# Patient Record
Sex: Female | Born: 1950 | Race: Black or African American | Hispanic: No | Marital: Single | Smoking: Current every day smoker
Health system: Southern US, Community
[De-identification: ages and names within clinical notes are randomized; demographics above are authoritative.]

## PROBLEM LIST (undated history)

## (undated) DIAGNOSIS — R112 Nausea with vomiting, unspecified: Secondary | ICD-10-CM

## (undated) DIAGNOSIS — G8929 Other chronic pain: Secondary | ICD-10-CM

## (undated) DIAGNOSIS — Z860100 Personal history of colon polyps, unspecified: Secondary | ICD-10-CM

## (undated) DIAGNOSIS — J320 Chronic maxillary sinusitis: Secondary | ICD-10-CM

## (undated) DIAGNOSIS — R519 Headache, unspecified: Secondary | ICD-10-CM

## (undated) DIAGNOSIS — H269 Unspecified cataract: Secondary | ICD-10-CM

## (undated) DIAGNOSIS — R2990 Unspecified symptoms and signs involving the nervous system: Secondary | ICD-10-CM

## (undated) DIAGNOSIS — F17200 Nicotine dependence, unspecified, uncomplicated: Secondary | ICD-10-CM

## (undated) DIAGNOSIS — M654 Radial styloid tenosynovitis [de Quervain]: Secondary | ICD-10-CM

## (undated) DIAGNOSIS — R1013 Epigastric pain: Secondary | ICD-10-CM

## (undated) DIAGNOSIS — K219 Gastro-esophageal reflux disease without esophagitis: Secondary | ICD-10-CM

## (undated) DIAGNOSIS — J449 Chronic obstructive pulmonary disease, unspecified: Secondary | ICD-10-CM

## (undated) DIAGNOSIS — J42 Unspecified chronic bronchitis: Secondary | ICD-10-CM

## (undated) DIAGNOSIS — M545 Low back pain, unspecified: Secondary | ICD-10-CM

## (undated) DIAGNOSIS — R51 Headache: Secondary | ICD-10-CM

## (undated) DIAGNOSIS — L408 Other psoriasis: Secondary | ICD-10-CM

## (undated) DIAGNOSIS — M79604 Pain in right leg: Secondary | ICD-10-CM

## (undated) DIAGNOSIS — R109 Unspecified abdominal pain: Secondary | ICD-10-CM

## (undated) HISTORY — PX: UPPER GASTROINTESTINAL ENDOSCOPY: SHX188

## (undated) HISTORY — PX: APPENDECTOMY: SHX54

## (undated) HISTORY — PX: TONSILLECTOMY: SUR1361

## (undated) HISTORY — DX: Headache, unspecified: R51.9

## (undated) HISTORY — PX: HERNIA REPAIR: SHX51

## (undated) HISTORY — DX: Chronic maxillary sinusitis: J32.0

## (undated) HISTORY — PX: ABDOMINAL HERNIA REPAIR: SHX539

## (undated) HISTORY — PX: VAGINAL HYSTERECTOMY: SUR661

---

## 1999-11-21 ENCOUNTER — Encounter: Payer: Self-pay | Admitting: Family Medicine

## 1999-11-21 ENCOUNTER — Ambulatory Visit (HOSPITAL_COMMUNITY): Admission: RE | Admit: 1999-11-21 | Discharge: 1999-11-21 | Payer: Self-pay | Admitting: Family Medicine

## 1999-11-22 ENCOUNTER — Emergency Department (HOSPITAL_COMMUNITY): Admission: EM | Admit: 1999-11-22 | Discharge: 1999-11-23 | Payer: Self-pay | Admitting: Emergency Medicine

## 1999-11-23 ENCOUNTER — Encounter: Payer: Self-pay | Admitting: Emergency Medicine

## 2000-10-09 ENCOUNTER — Emergency Department (HOSPITAL_COMMUNITY): Admission: EM | Admit: 2000-10-09 | Discharge: 2000-10-10 | Payer: Self-pay | Admitting: Emergency Medicine

## 2000-10-10 ENCOUNTER — Encounter: Payer: Self-pay | Admitting: Family Medicine

## 2000-10-28 ENCOUNTER — Ambulatory Visit (HOSPITAL_COMMUNITY): Admission: RE | Admit: 2000-10-28 | Discharge: 2000-10-28 | Payer: Self-pay | Admitting: Family Medicine

## 2001-03-01 ENCOUNTER — Encounter: Payer: Self-pay | Admitting: Family Medicine

## 2001-03-01 ENCOUNTER — Ambulatory Visit (HOSPITAL_COMMUNITY): Admission: RE | Admit: 2001-03-01 | Discharge: 2001-03-01 | Payer: Self-pay | Admitting: Family Medicine

## 2004-05-27 ENCOUNTER — Ambulatory Visit: Payer: Self-pay | Admitting: Family Medicine

## 2004-06-05 ENCOUNTER — Ambulatory Visit: Payer: Self-pay | Admitting: Internal Medicine

## 2004-06-06 ENCOUNTER — Ambulatory Visit: Payer: Self-pay | Admitting: *Deleted

## 2004-06-12 ENCOUNTER — Ambulatory Visit: Payer: Self-pay | Admitting: Internal Medicine

## 2004-07-11 ENCOUNTER — Ambulatory Visit: Payer: Self-pay | Admitting: Internal Medicine

## 2004-07-16 ENCOUNTER — Ambulatory Visit: Payer: Self-pay | Admitting: Internal Medicine

## 2004-08-08 ENCOUNTER — Ambulatory Visit: Payer: Self-pay | Admitting: Internal Medicine

## 2004-09-23 ENCOUNTER — Ambulatory Visit: Payer: Self-pay | Admitting: Internal Medicine

## 2004-11-21 ENCOUNTER — Ambulatory Visit: Payer: Self-pay | Admitting: Internal Medicine

## 2004-12-02 ENCOUNTER — Ambulatory Visit (HOSPITAL_COMMUNITY): Admission: RE | Admit: 2004-12-02 | Discharge: 2004-12-02 | Payer: Self-pay | Admitting: Internal Medicine

## 2004-12-18 ENCOUNTER — Ambulatory Visit: Payer: Self-pay | Admitting: Internal Medicine

## 2005-01-23 ENCOUNTER — Ambulatory Visit: Payer: Self-pay | Admitting: Internal Medicine

## 2005-02-12 ENCOUNTER — Ambulatory Visit: Payer: Self-pay | Admitting: Internal Medicine

## 2005-02-12 ENCOUNTER — Inpatient Hospital Stay (HOSPITAL_COMMUNITY): Admission: AD | Admit: 2005-02-12 | Discharge: 2005-02-16 | Payer: Self-pay | Admitting: Internal Medicine

## 2005-02-26 ENCOUNTER — Encounter (INDEPENDENT_AMBULATORY_CARE_PROVIDER_SITE_OTHER): Payer: Self-pay | Admitting: Specialist

## 2005-02-26 ENCOUNTER — Ambulatory Visit (HOSPITAL_COMMUNITY): Admission: RE | Admit: 2005-02-26 | Discharge: 2005-02-26 | Payer: Self-pay | Admitting: Gastroenterology

## 2005-02-26 HISTORY — PX: COLONOSCOPY W/ POLYPECTOMY: SHX1380

## 2005-02-28 ENCOUNTER — Emergency Department (HOSPITAL_COMMUNITY): Admission: EM | Admit: 2005-02-28 | Discharge: 2005-03-01 | Payer: Self-pay | Admitting: Emergency Medicine

## 2005-05-21 ENCOUNTER — Ambulatory Visit: Payer: Self-pay | Admitting: Internal Medicine

## 2005-06-23 ENCOUNTER — Ambulatory Visit: Payer: Self-pay | Admitting: Internal Medicine

## 2005-11-17 ENCOUNTER — Ambulatory Visit: Payer: Self-pay | Admitting: Internal Medicine

## 2005-11-25 ENCOUNTER — Ambulatory Visit: Payer: Self-pay | Admitting: Internal Medicine

## 2005-12-18 ENCOUNTER — Ambulatory Visit (HOSPITAL_COMMUNITY): Admission: RE | Admit: 2005-12-18 | Discharge: 2005-12-18 | Payer: Self-pay | Admitting: Internal Medicine

## 2005-12-18 ENCOUNTER — Ambulatory Visit: Payer: Self-pay | Admitting: Internal Medicine

## 2005-12-19 ENCOUNTER — Ambulatory Visit: Payer: Self-pay | Admitting: Internal Medicine

## 2005-12-22 ENCOUNTER — Ambulatory Visit: Payer: Self-pay | Admitting: Internal Medicine

## 2006-02-02 ENCOUNTER — Ambulatory Visit: Payer: Self-pay | Admitting: Internal Medicine

## 2006-02-19 ENCOUNTER — Ambulatory Visit: Payer: Self-pay | Admitting: Family Medicine

## 2006-02-25 ENCOUNTER — Ambulatory Visit (HOSPITAL_COMMUNITY): Admission: RE | Admit: 2006-02-25 | Discharge: 2006-02-25 | Payer: Self-pay | Admitting: Internal Medicine

## 2006-03-10 ENCOUNTER — Ambulatory Visit: Payer: Self-pay | Admitting: Internal Medicine

## 2006-03-12 ENCOUNTER — Ambulatory Visit (HOSPITAL_COMMUNITY): Admission: RE | Admit: 2006-03-12 | Discharge: 2006-03-12 | Payer: Self-pay | Admitting: Internal Medicine

## 2006-04-17 ENCOUNTER — Ambulatory Visit: Payer: Self-pay | Admitting: Internal Medicine

## 2006-05-12 ENCOUNTER — Ambulatory Visit: Payer: Self-pay | Admitting: Internal Medicine

## 2006-05-15 ENCOUNTER — Ambulatory Visit (HOSPITAL_COMMUNITY): Admission: RE | Admit: 2006-05-15 | Discharge: 2006-05-15 | Payer: Self-pay | Admitting: Internal Medicine

## 2006-05-19 ENCOUNTER — Ambulatory Visit: Payer: Self-pay | Admitting: Internal Medicine

## 2006-05-26 ENCOUNTER — Ambulatory Visit: Payer: Self-pay | Admitting: Internal Medicine

## 2006-06-02 ENCOUNTER — Ambulatory Visit: Payer: Self-pay | Admitting: Internal Medicine

## 2006-06-10 ENCOUNTER — Ambulatory Visit: Payer: Self-pay | Admitting: Internal Medicine

## 2006-06-18 ENCOUNTER — Ambulatory Visit: Payer: Self-pay | Admitting: Internal Medicine

## 2006-07-22 ENCOUNTER — Ambulatory Visit: Payer: Self-pay | Admitting: Family Medicine

## 2006-07-27 ENCOUNTER — Ambulatory Visit (HOSPITAL_COMMUNITY): Admission: RE | Admit: 2006-07-27 | Discharge: 2006-07-27 | Payer: Self-pay | Admitting: Internal Medicine

## 2006-08-03 ENCOUNTER — Ambulatory Visit: Payer: Self-pay | Admitting: Internal Medicine

## 2006-12-07 ENCOUNTER — Ambulatory Visit: Payer: Self-pay | Admitting: Internal Medicine

## 2006-12-16 ENCOUNTER — Ambulatory Visit: Payer: Self-pay | Admitting: Internal Medicine

## 2006-12-23 ENCOUNTER — Ambulatory Visit: Payer: Self-pay | Admitting: Internal Medicine

## 2006-12-25 ENCOUNTER — Ambulatory Visit (HOSPITAL_COMMUNITY): Admission: RE | Admit: 2006-12-25 | Discharge: 2006-12-25 | Payer: Self-pay | Admitting: Internal Medicine

## 2006-12-29 ENCOUNTER — Encounter: Admission: RE | Admit: 2006-12-29 | Discharge: 2006-12-29 | Payer: Self-pay | Admitting: Internal Medicine

## 2006-12-29 DIAGNOSIS — K219 Gastro-esophageal reflux disease without esophagitis: Secondary | ICD-10-CM | POA: Insufficient documentation

## 2006-12-29 DIAGNOSIS — L408 Other psoriasis: Secondary | ICD-10-CM

## 2006-12-29 DIAGNOSIS — K449 Diaphragmatic hernia without obstruction or gangrene: Secondary | ICD-10-CM | POA: Insufficient documentation

## 2006-12-29 DIAGNOSIS — J441 Chronic obstructive pulmonary disease with (acute) exacerbation: Secondary | ICD-10-CM | POA: Insufficient documentation

## 2006-12-29 HISTORY — DX: Other psoriasis: L40.8

## 2007-01-13 ENCOUNTER — Ambulatory Visit: Payer: Self-pay | Admitting: Internal Medicine

## 2007-04-13 ENCOUNTER — Encounter (INDEPENDENT_AMBULATORY_CARE_PROVIDER_SITE_OTHER): Payer: Self-pay | Admitting: Internal Medicine

## 2007-04-13 ENCOUNTER — Ambulatory Visit: Payer: Self-pay | Admitting: Family Medicine

## 2007-04-13 LAB — CONVERTED CEMR LAB
AST: 12 units/L (ref 0–37)
Alkaline Phosphatase: 95 units/L (ref 39–117)
BUN: 11 mg/dL (ref 6–23)
Basophils Absolute: 0 10*3/uL (ref 0.0–0.1)
Basophils Relative: 0 % (ref 0–1)
Creatinine, Ser: 0.74 mg/dL (ref 0.40–1.20)
Eosinophils Absolute: 0.1 10*3/uL (ref 0.0–0.7)
Hemoglobin: 12.6 g/dL (ref 12.0–15.0)
MCHC: 31.7 g/dL (ref 30.0–36.0)
MCV: 92.8 fL (ref 78.0–100.0)
Monocytes Absolute: 0.4 10*3/uL (ref 0.2–0.7)
Monocytes Relative: 5 % (ref 3–11)
Neutrophils Relative %: 48 % (ref 43–77)
RBC: 4.28 M/uL (ref 3.87–5.11)
RDW: 14.1 % — ABNORMAL HIGH (ref 11.5–14.0)

## 2007-05-19 ENCOUNTER — Encounter (INDEPENDENT_AMBULATORY_CARE_PROVIDER_SITE_OTHER): Payer: Self-pay | Admitting: *Deleted

## 2007-08-12 ENCOUNTER — Ambulatory Visit: Payer: Self-pay | Admitting: Internal Medicine

## 2007-09-07 ENCOUNTER — Ambulatory Visit: Payer: Self-pay | Admitting: Internal Medicine

## 2007-09-29 ENCOUNTER — Ambulatory Visit: Payer: Self-pay | Admitting: Internal Medicine

## 2007-10-19 ENCOUNTER — Ambulatory Visit: Payer: Self-pay | Admitting: Internal Medicine

## 2007-10-19 LAB — CONVERTED CEMR LAB
ALT: 8 units/L (ref 0–35)
AST: 10 units/L (ref 0–37)
Basophils Absolute: 0 10*3/uL (ref 0.0–0.1)
Basophils Relative: 0 % (ref 0–1)
CO2: 25 meq/L (ref 19–32)
Creatinine, Ser: 0.76 mg/dL (ref 0.40–1.20)
Eosinophils Absolute: 0.1 10*3/uL (ref 0.0–0.7)
Eosinophils Relative: 1 % (ref 0–5)
HCT: 41.4 % (ref 36.0–46.0)
Hemoglobin: 13.2 g/dL (ref 12.0–15.0)
MCHC: 31.9 g/dL (ref 30.0–36.0)
MCV: 91.8 fL (ref 78.0–100.0)
Monocytes Absolute: 0.5 10*3/uL (ref 0.1–1.0)
RDW: 13.8 % (ref 11.5–15.5)
Total Bilirubin: 0.4 mg/dL (ref 0.3–1.2)

## 2007-10-28 ENCOUNTER — Ambulatory Visit: Payer: Self-pay | Admitting: Internal Medicine

## 2007-12-21 ENCOUNTER — Ambulatory Visit: Payer: Self-pay | Admitting: Internal Medicine

## 2008-01-06 ENCOUNTER — Ambulatory Visit: Payer: Self-pay | Admitting: Internal Medicine

## 2008-02-17 ENCOUNTER — Ambulatory Visit: Payer: Self-pay | Admitting: Internal Medicine

## 2008-03-02 ENCOUNTER — Ambulatory Visit: Payer: Self-pay | Admitting: Internal Medicine

## 2008-03-10 ENCOUNTER — Ambulatory Visit: Payer: Self-pay | Admitting: Internal Medicine

## 2008-03-20 ENCOUNTER — Ambulatory Visit: Payer: Self-pay | Admitting: Internal Medicine

## 2008-03-27 ENCOUNTER — Ambulatory Visit: Payer: Self-pay | Admitting: Internal Medicine

## 2008-04-10 ENCOUNTER — Ambulatory Visit: Payer: Self-pay | Admitting: Internal Medicine

## 2008-05-18 ENCOUNTER — Ambulatory Visit (HOSPITAL_COMMUNITY): Admission: RE | Admit: 2008-05-18 | Discharge: 2008-05-18 | Payer: Self-pay | Admitting: Gastroenterology

## 2008-05-25 ENCOUNTER — Ambulatory Visit (HOSPITAL_COMMUNITY): Admission: RE | Admit: 2008-05-25 | Discharge: 2008-05-25 | Payer: Self-pay | Admitting: Gastroenterology

## 2008-06-08 ENCOUNTER — Encounter: Admission: RE | Admit: 2008-06-08 | Discharge: 2008-06-08 | Payer: Self-pay | Admitting: Internal Medicine

## 2008-06-22 ENCOUNTER — Ambulatory Visit (HOSPITAL_COMMUNITY): Admission: RE | Admit: 2008-06-22 | Discharge: 2008-06-22 | Payer: Self-pay | Admitting: Gastroenterology

## 2008-09-14 ENCOUNTER — Ambulatory Visit: Payer: Self-pay | Admitting: Internal Medicine

## 2008-10-24 ENCOUNTER — Ambulatory Visit: Payer: Self-pay | Admitting: Internal Medicine

## 2008-10-31 ENCOUNTER — Ambulatory Visit (HOSPITAL_COMMUNITY): Admission: RE | Admit: 2008-10-31 | Discharge: 2008-10-31 | Payer: Self-pay | Admitting: Internal Medicine

## 2008-11-06 ENCOUNTER — Emergency Department (HOSPITAL_COMMUNITY): Admission: EM | Admit: 2008-11-06 | Discharge: 2008-11-06 | Payer: Self-pay | Admitting: Emergency Medicine

## 2008-11-15 ENCOUNTER — Emergency Department (HOSPITAL_COMMUNITY): Admission: EM | Admit: 2008-11-15 | Discharge: 2008-11-15 | Payer: Self-pay | Admitting: Emergency Medicine

## 2009-04-15 ENCOUNTER — Emergency Department (HOSPITAL_COMMUNITY): Admission: EM | Admit: 2009-04-15 | Discharge: 2009-04-15 | Payer: Self-pay | Admitting: Emergency Medicine

## 2009-08-22 ENCOUNTER — Ambulatory Visit: Payer: Self-pay | Admitting: Internal Medicine

## 2009-09-06 ENCOUNTER — Ambulatory Visit (HOSPITAL_COMMUNITY): Admission: RE | Admit: 2009-09-06 | Discharge: 2009-09-06 | Payer: Self-pay | Admitting: Internal Medicine

## 2009-10-29 ENCOUNTER — Ambulatory Visit: Payer: Self-pay | Admitting: Internal Medicine

## 2009-10-29 LAB — CONVERTED CEMR LAB
BUN: 9 mg/dL (ref 6–23)
CO2: 27 meq/L (ref 19–32)
Calcium: 9.8 mg/dL (ref 8.4–10.5)
Chloride: 106 meq/L (ref 96–112)
Cholesterol: 210 mg/dL — ABNORMAL HIGH (ref 0–200)
Creatinine, Ser: 0.81 mg/dL (ref 0.40–1.20)
Eosinophils Absolute: 0.1 10*3/uL (ref 0.0–0.7)
Eosinophils Relative: 2 % (ref 0–5)
Glucose, Bld: 82 mg/dL (ref 70–99)
HCT: 42.1 % (ref 36.0–46.0)
HDL: 44 mg/dL (ref >39)
Lymphs Abs: 3 10*3/uL (ref 0.7–4.0)
MCV: 91.3 fL (ref 78.0–100.0)
Platelets: 237 10*3/uL (ref 150–400)
Total CHOL/HDL Ratio: 4.8
Triglycerides: 111 mg/dL (ref <150)
WBC: 5.6 10*3/uL (ref 4.0–10.5)

## 2009-10-31 ENCOUNTER — Ambulatory Visit (HOSPITAL_COMMUNITY): Admission: RE | Admit: 2009-10-31 | Discharge: 2009-10-31 | Payer: Self-pay | Admitting: Internal Medicine

## 2009-11-19 ENCOUNTER — Emergency Department (HOSPITAL_COMMUNITY): Admission: EM | Admit: 2009-11-19 | Discharge: 2009-11-19 | Payer: Self-pay | Admitting: Emergency Medicine

## 2009-12-04 ENCOUNTER — Ambulatory Visit: Payer: Self-pay | Admitting: Internal Medicine

## 2009-12-05 ENCOUNTER — Encounter (INDEPENDENT_AMBULATORY_CARE_PROVIDER_SITE_OTHER): Payer: Self-pay | Admitting: Internal Medicine

## 2009-12-24 ENCOUNTER — Ambulatory Visit: Payer: Self-pay | Admitting: Internal Medicine

## 2009-12-25 ENCOUNTER — Ambulatory Visit: Payer: Self-pay | Admitting: Internal Medicine

## 2010-01-15 ENCOUNTER — Ambulatory Visit: Payer: Self-pay | Admitting: Internal Medicine

## 2010-01-15 LAB — CONVERTED CEMR LAB
Albumin: 4.8 g/dL (ref 3.5–5.2)
Alkaline Phosphatase: 84 units/L (ref 39–117)
BUN: 13 mg/dL (ref 6–23)
Basophils Absolute: 0 10*3/uL (ref 0.0–0.1)
Basophils Relative: 0 % (ref 0–1)
CO2: 26 meq/L (ref 19–32)
Eosinophils Relative: 1 % (ref 0–5)
Free T4: 1.33 ng/dL (ref 0.80–1.80)
Glucose, Bld: 73 mg/dL (ref 70–99)
HCT: 43.9 % (ref 36.0–46.0)
Hemoglobin: 14.1 g/dL (ref 12.0–15.0)
Lipase: 43 units/L (ref 0–75)
MCHC: 32.1 g/dL (ref 30.0–36.0)
Monocytes Absolute: 0.5 10*3/uL (ref 0.1–1.0)
Monocytes Relative: 8 % (ref 3–12)
Potassium: 5.2 meq/L (ref 3.5–5.3)
RDW: 13.9 % (ref 11.5–15.5)
TSH: 0.921 microintl units/mL (ref 0.350–4.500)
Total Bilirubin: 0.5 mg/dL (ref 0.3–1.2)

## 2010-01-27 ENCOUNTER — Emergency Department (HOSPITAL_COMMUNITY): Admission: EM | Admit: 2010-01-27 | Discharge: 2010-01-27 | Payer: Self-pay | Admitting: Emergency Medicine

## 2010-01-30 ENCOUNTER — Ambulatory Visit (HOSPITAL_COMMUNITY): Admission: RE | Admit: 2010-01-30 | Discharge: 2010-01-30 | Payer: Self-pay | Admitting: Internal Medicine

## 2010-02-05 ENCOUNTER — Ambulatory Visit: Payer: Self-pay | Admitting: Internal Medicine

## 2010-03-07 ENCOUNTER — Ambulatory Visit (HOSPITAL_COMMUNITY): Admission: RE | Admit: 2010-03-07 | Discharge: 2010-03-07 | Payer: Self-pay | Admitting: Gastroenterology

## 2010-03-22 ENCOUNTER — Ambulatory Visit (HOSPITAL_COMMUNITY): Admission: RE | Admit: 2010-03-22 | Discharge: 2010-03-22 | Payer: Self-pay | Admitting: Gastroenterology

## 2010-05-21 ENCOUNTER — Encounter: Admission: RE | Admit: 2010-05-21 | Discharge: 2010-05-31 | Payer: Self-pay | Admitting: Internal Medicine

## 2010-05-31 ENCOUNTER — Ambulatory Visit (HOSPITAL_COMMUNITY): Admission: RE | Admit: 2010-05-31 | Discharge: 2010-05-31 | Payer: Self-pay | Admitting: Family Medicine

## 2010-09-07 ENCOUNTER — Inpatient Hospital Stay (HOSPITAL_COMMUNITY)
Admission: EM | Admit: 2010-09-07 | Discharge: 2010-09-13 | Payer: Self-pay | Source: Home / Self Care | Attending: Internal Medicine | Admitting: Internal Medicine

## 2010-09-16 LAB — BASIC METABOLIC PANEL
BUN: 10 mg/dL (ref 6–23)
BUN: 10 mg/dL (ref 6–23)
BUN: 16 mg/dL (ref 6–23)
CO2: 25 mEq/L (ref 19–32)
CO2: 27 mEq/L (ref 19–32)
CO2: 26 mEq/L (ref 19–32)
Calcium: 10 mg/dL (ref 8.4–10.5)
Calcium: 9.4 mg/dL (ref 8.4–10.5)
Calcium: 8.6 mg/dL (ref 8.4–10.5)
Chloride: 102 mEq/L (ref 96–112)
Chloride: 107 mEq/L (ref 96–112)
Chloride: 106 mEq/L (ref 96–112)
Creatinine, Ser: 0.93 mg/dL (ref 0.4–1.2)
Creatinine, Ser: 0.79 mg/dL (ref 0.4–1.2)
Creatinine, Ser: 0.86 mg/dL (ref 0.4–1.2)
GFR calc Af Amer: >60 mL/min (ref >60)
GFR calc Af Amer: >60 mL/min (ref >60)
GFR calc Af Amer: >60 mL/min (ref >60)
GFR calc non Af Amer: >60 mL/min (ref >60)
GFR calc non Af Amer: >60 mL/min (ref >60)
GFR calc non Af Amer: >60 mL/min (ref >60)
Glucose, Bld: 188 mg/dL — ABNORMAL HIGH (ref 70–99)
Glucose, Bld: 116 mg/dL — ABNORMAL HIGH (ref 70–99)
Glucose, Bld: 113 mg/dL — ABNORMAL HIGH (ref 70–99)
Potassium: 3.7 mEq/L (ref 3.5–5.1)
Potassium: 3.8 mEq/L (ref 3.5–5.1)
Potassium: 3.6 mEq/L (ref 3.5–5.1)
Sodium: 143 mEq/L (ref 135–145)
Sodium: 142 mEq/L (ref 135–145)
Sodium: 139 mEq/L (ref 135–145)

## 2010-09-16 LAB — CBC
HCT: 42.1 % (ref 36.0–46.0)
HCT: 35.6 % — ABNORMAL LOW (ref 36.0–46.0)
Hemoglobin: 14.1 g/dL (ref 12.0–15.0)
Hemoglobin: 11.6 g/dL — ABNORMAL LOW (ref 12.0–15.0)
MCH: 30.6 pg (ref 26.0–34.0)
MCH: 29.7 pg (ref 26.0–34.0)
MCHC: 33.5 g/dL (ref 30.0–36.0)
MCHC: 32.6 g/dL (ref 30.0–36.0)
MCV: 91.3 fL (ref 78.0–100.0)
MCV: 91.3 fL (ref 78.0–100.0)
Platelets: 340 10*3/uL (ref 150–400)
Platelets: 269 10*3/uL (ref 150–400)
RBC: 4.61 MIL/uL (ref 3.87–5.11)
RBC: 3.9 MIL/uL (ref 3.87–5.11)
RDW: 13.1 % (ref 11.5–15.5)
RDW: 13.5 % (ref 11.5–15.5)
WBC: 8 10*3/uL (ref 4.0–10.5)
WBC: 10.9 10*3/uL — ABNORMAL HIGH (ref 4.0–10.5)

## 2010-09-16 LAB — BLOOD GAS, ARTERIAL
Acid-Base Excess: 1.8 mmol/L (ref 0.0–2.0)
Bicarbonate: 22.7 mEq/L (ref 20.0–24.0)
Drawn by: 307971
FIO2: 0.21 %
O2 Saturation: 97.8 %
Patient temperature: 98.6
TCO2: 19.6 mmol/L (ref 0–100)
pCO2 arterial: 25.7 mmHg — ABNORMAL LOW (ref 35.0–45.0)
pH, Arterial: 7.555 — ABNORMAL HIGH (ref 7.350–7.400)
pO2, Arterial: 84.8 mmHg (ref 80.0–100.0)

## 2010-09-16 LAB — DIFFERENTIAL
Basophils Absolute: 0 10*3/uL (ref 0.0–0.1)
Basophils Relative: 0 % (ref 0–1)
Eosinophils Absolute: 0 10*3/uL (ref 0.0–0.7)
Eosinophils Relative: 0 % (ref 0–5)
Lymphocytes Relative: 24 % (ref 12–46)
Lymphs Abs: 1.9 10*3/uL (ref 0.7–4.0)
Monocytes Absolute: 0.2 10*3/uL (ref 0.1–1.0)
Monocytes Relative: 3 % (ref 3–12)
Neutro Abs: 5.9 10*3/uL (ref 1.7–7.7)
Neutrophils Relative %: 73 % (ref 43–77)

## 2010-09-16 LAB — HEPATIC FUNCTION PANEL
ALT: 12 U/L (ref 0–35)
AST: 13 U/L (ref 0–37)
Albumin: 2.9 g/dL — ABNORMAL LOW (ref 3.5–5.2)
Alkaline Phosphatase: 59 U/L (ref 39–117)
Bilirubin, Direct: 0.1 mg/dL (ref 0.0–0.3)
Indirect Bilirubin: 0.3 mg/dL (ref 0.3–0.9)
Total Bilirubin: 0.4 mg/dL (ref 0.3–1.2)
Total Protein: 5.7 g/dL — ABNORMAL LOW (ref 6.0–8.3)

## 2010-09-16 LAB — CARDIAC PANEL(CRET KIN+CKTOT+MB+TROPI)
CK, MB: 0.8 ng/mL (ref 0.3–4.0)
CK, MB: 1.1 ng/mL (ref 0.3–4.0)
CK, MB: 1.5 ng/mL (ref 0.3–4.0)
Relative Index: INVALID (ref 0.0–2.5)
Relative Index: INVALID (ref 0.0–2.5)
Relative Index: INVALID (ref 0.0–2.5)
Total CK: 69 U/L (ref 7–177)
Total CK: 76 U/L (ref 7–177)
Total CK: 84 U/L (ref 7–177)
Troponin I: 0.01 ng/mL (ref 0.00–0.06)
Troponin I: 0.01 ng/mL (ref 0.00–0.06)
Troponin I: 0.01 ng/mL (ref 0.00–0.06)

## 2010-09-16 LAB — CK TOTAL AND CKMB (NOT AT ARMC)
CK, MB: 0.8 ng/mL (ref 0.3–4.0)
Relative Index: INVALID (ref 0.0–2.5)
Total CK: 69 U/L (ref 7–177)

## 2010-09-16 LAB — MAGNESIUM: Magnesium: 2.3 mg/dL (ref 1.5–2.5)

## 2010-09-16 LAB — TROPONIN I: Troponin I: 0.01 ng/mL (ref 0.00–0.06)

## 2010-09-16 LAB — PHOSPHORUS: Phosphorus: 4.2 mg/dL (ref 2.3–4.6)

## 2010-09-16 LAB — LIPASE, BLOOD: Lipase: 23 U/L (ref 11–59)

## 2010-09-17 ENCOUNTER — Emergency Department (HOSPITAL_COMMUNITY)
Admission: EM | Admit: 2010-09-17 | Discharge: 2010-09-18 | Payer: Self-pay | Source: Home / Self Care | Admitting: Emergency Medicine

## 2010-09-22 ENCOUNTER — Encounter: Payer: Self-pay | Admitting: Internal Medicine

## 2010-09-22 NOTE — Consult Note (Signed)
NAMEEASTYN, Fuller               ACCOUNT NO.:  [id number]  MEDICAL RECORD NO.:  [id number]          PATIENT TYPE:  INP  LOCATION:  1412                         FACILITY:  [facility]  PHYSICIAN:  Willis Modena, MD     DATE OF BIRTH:  [birth year]  DATE OF CONSULTATION:  09/11/2010 DATE OF DISCHARGE:                                CONSULTATION   CONSULTING PHYSICIAN:  Dr. Ashley Royalty, triad hospitalist.  REASON FOR CONSULTATION:  Nausea and vomiting.  CHIEF COMPLAINT:  Nausea and vomiting.  HISTORY OF PRESENT ILLNESS:  [name] is a 60 year old black female admitted for COPD exacerbation.  During this admission, she is complaining of her chronic nausea and vomiting and we have been asked to help with this.  She has had trouble with persistent and progressive nausea and vomiting for at least 5 years.  This has been subacutely worse over the past several months.  She describes nausea and vomiting both after eating and in the absence of eating.  This is associated with generalized abdominal pain but seemingly worse in the lower abdomen after several episodes of emesis.  She describes vomiting bilious fluid at times and says she has consumed a couple days ago at times.  She does have some early satiety and postprandial fullness.  She has no dysphagia or blood in her stool.  Her bowels typically move regularly and she denies any frequent constipation or diarrhea.  She does endorse losing about 70 pounds over the past several months.  Examination into her symptoms has been extensive including an endoscopy in July that showed H pylori positive gastritis for which she was treated with a Prevpac.  It is not clear whether she had a urea breath tests or any other test to confirm eradication.  Endoscopy was otherwise negative.  She had a gastric emptying study in July as well that was negative for delayed gastric emptying.  She has had an abdominal ultrasound in August 2010 that showed no  gallstones.  She had an upper GI series in September 2011 that was normal.  She had a CT scan of the abdomen and pelvis in June which showed some nondescript liver areas that were seemingly relatively unchanged since her last scan in 2008.  PAST MEDICAL HISTORY:  Per dictated note from Dr. Mechele Claude dated June September 07, 2010.  I have reviewed and I agree.  PAST SURGICAL HISTORY:  Per dictated note from Dr. Mechele Claude dated June September 07, 2010.  I have reviewed and I agree.  HOME MEDICATIONS:  Per dictated note from Dr. Mechele Claude dated June September 07, 2010.  I have reviewed and I agree.  ALLERGIES:  Per dictated note from Dr. Mechele Claude dated June September 07, 2010. I have reviewed and I agree.  FAMILY HISTORY:  Per dictated note from Dr. Mechele Claude dated June September 07, 2010.  I have reviewed and I agree.  SOCIAL HISTORY:  Per dictated note from Dr. Mechele Claude dated June September 07, 2010.  I have reviewed and I agree.  REVIEW OF SYSTEMS:  Per dictated note from Dr. Mechele Claude dated June September 07, 2010.  I have reviewed and  I agree.  PHYSICAL EXAMINATION:  VITAL SIGNS:  Blood pressure is 131/76, heart rate 92, respiratory rate 16, temperature 98.5. GENERAL:  Ms. Kittel is in no acute distress.  She appears older than stated age. ENT:  Normocephalic, atraumatic.  No oropharyngeal lesions.  Dentition is fair. EYES:  Sclerae anicteric.  Conjunctiva pink. NECK:  Supple without thyromegaly or lymphadenopathy. HEART:  Regular rhythm, normal rate without murmur, rub or gallop. LUNGS:  Clear to auscultation without rales, rhonchi or wheeze. ABDOMEN:  Mildly protuberant, soft, mild generalized tenderness that decreases with distraction maneuvers.  No obvious liver or splenic enlargement.  No bulging flanks to suggest ascites. EXTREMITIES:  No peripheral cyanosis, clubbing or edema. SKIN:  No obvious rash or ecchymoses. LYMPHATICS:  No palpable axillary, submandibular or supraclavicular adenopathy. PSYCHIATRIC:  Flat  affect, depressed mood. NEUROLOGIC:  Diffusely weak but nonfocal without lateralizing signs.  LABORATORY STUDIES:  White count 10.9, hemoglobin 11.6, platelet count is 269.  Sodium 139, potassium 3.6, chloride 106, bicarb 26, BUN 15, creatinine 0.86, total protein 5.7, albumin 2.0, bilirubin 0.4, alk phos 59, ALT 13, AST 12, lipase normal at 23.  RADIOLOGIC STUDIES:  Chest x-ray on admission showing bronchitic changes without focal pneumonia.  IMPRESSION:  [name] is a 60 year old black female with chronic nausea and vomiting who is recovering from a chronic obstructive pulmonary disease exacerbation.  She has had extensive evaluation for her symptoms and the only thing that has been remarkable thus far is Helicobacter pylori which has seemingly been treated with a Prevpac. The etiology of her chronic nausea and vomiting and weight loss is unclear.  Despite her gastric emptying study, her history and symptoms are suggestive of a possible dysmotility disturbance.  Alternatively, she could have an atypical presentation of chronic cholecystitis or biliary dyskinesia.  She does not appear to have any obstructive lesions or mucosal process such as active acid reflux or ulcers.  Possibility of a functional GI disorder should also be entertained.  PLAN: 1. Would gradually advance her diet as you are doing. 2. Would treat her nausea and vomiting symptomatically with     antiemetics. 3. We will check a high HIDA scan. 4. I have discussed the case with Dr. Ashley Royalty.  Thanks for giving me the opportunity to participate in Ms. Minchew's care.     Willis Modena, MD     WO/MEDQ  D:  09/12/2010  T:  09/12/2010  Job:  [id number]  Electronically Signed by Willis Modena  on 09/22/2010 03:28:00 PM

## 2010-09-23 ENCOUNTER — Encounter: Payer: Self-pay | Admitting: *Deleted

## 2010-09-23 LAB — CBC
MCH: 29.6 pg (ref 26.0–34.0)
MCHC: 32.3 g/dL (ref 30.0–36.0)
Platelets: 333 10*3/uL (ref 150–400)
RDW: 14.4 % (ref 11.5–15.5)

## 2010-09-23 LAB — COMPREHENSIVE METABOLIC PANEL
AST: 23 U/L (ref 0–37)
BUN: 11 mg/dL (ref 6–23)
CO2: 25 mEq/L (ref 19–32)
Chloride: 104 mEq/L (ref 96–112)
Creatinine, Ser: 0.94 mg/dL (ref 0.4–1.2)
GFR calc Af Amer: >60 mL/min (ref >60)
GFR calc non Af Amer: >60 mL/min (ref >60)
Glucose, Bld: 127 mg/dL — ABNORMAL HIGH (ref 70–99)
Total Bilirubin: 0.4 mg/dL (ref 0.3–1.2)

## 2010-09-23 LAB — DIFFERENTIAL
Basophils Absolute: 0 10*3/uL (ref 0.0–0.1)
Basophils Relative: 0 % (ref 0–1)
Eosinophils Absolute: 0 10*3/uL (ref 0.0–0.7)
Eosinophils Relative: 0 % (ref 0–5)
Monocytes Absolute: 0.6 10*3/uL (ref 0.1–1.0)
Monocytes Relative: 5 % (ref 3–12)

## 2010-09-23 LAB — POCT CARDIAC MARKERS
Myoglobin, poc: 50.3 ng/mL (ref 12–200)
Myoglobin, poc: 27.7 ng/mL (ref 12–200)

## 2010-09-23 LAB — LIPASE, BLOOD: Lipase: 44 U/L (ref 11–59)

## 2010-10-13 NOTE — H&P (Signed)
NAME:  [name], [name] NO.:  [id number]  MEDICAL RECORD NO.:  [id number]          PATIENT TYPE:  EMS  LOCATION:  ED                           FACILITY:  [facility]  PHYSICIAN:  Clayborn Bigness, MD, MPH  DATE OF BIRTH:  1950-12-13  DATE OF ADMISSION:  09/07/2010 DATE OF DISCHARGE:                             HISTORY & PHYSICAL   CHIEF COMPLAINT:  "Cough and they said I have a COPD exacerbation."  HISTORY OF PRESENT ILLNESS:  This is a 60 year old female with a history of COPD, asthma, and chronic GI problems (ulcers) who presents with a 1 week history of cough.  She states that her cough was initially productive of clear sputum, which turned greenish.  She was seen by her primary care physician and was started on an antibiotic ("bronchitis"), a steroid taper, and breathing treatment.  She states that she is still on antibiotics and the steroids.  However, yesterday, she developed substernal, nonradiating chest pain and tightness while sitting at rest. She states that she had just finished taking her medications.  The chest tightness was associated with nausea and vomiting.  She vomited numerous times stating that she was up all night vomiting.  Since then, she has had no p.o. intake.  She denies fever or chills.  She has never had chest pain like this before.  However, she does have chronic abdominal issues, including not being able to swallow.  She denies odynophagia. Stating that she is currently being worked up for her GI issues.  She has lost about 70 or 80 pounds over the past 3 years.  REVIEW OF SYSTEMS:  10-point review of systems is also positive for 2-3 pillow orthopnea.  The patient states that she is not able to lie flat, but she thought that was due to her asthma and COPD.  No lower extremity swelling.  Of note, she is a nonsmoker.  PAST MEDICAL HISTORY:  As above.  SOCIAL HISTORY:  She lives alone.  She has 2 grown children.  She is not currently  smoking although she is a former smoker, having quit 3 years ago.  No alcohol use.  No illicit drug use.  FAMILY HISTORY:  Significant for mother who had MI at age 49, however, her mother is still alive at age 68.  Mother also had colon cancer and has a colostomy bag.  Her sister also has a colostomy bag.  Her mother and brother both have hypertension.  HOME MEDICATIONS:  Inhalers, Phenergan p.o. b.i.d., Protonix.  ALLERGIES:  No known drug allergies.  ED EVALUATION:  She was given albuterol, Atrovent nebs, Zofran x3 doses, Solu-Medrol 125 mg, Reglan 10 mg, Protonix 40 mg, GI cocktail (which she states helped her tremendously), morphine 2 mg, and aspirin 162 mg.  ED VITAL SIGNS:  Temperature of 99.4, pulse 113, respirations 24, blood pressure 157/102, O2 sat 97% on room air.  RADIOLOGICAL STUDIES:  EKG showed sinus tachycardia at 113, normal axis and intervals with ST depressions in leads V3 through V6.  Portable chest x-ray was significant for bronchitic changes and no focal infiltrate.  LABORATORY DATA:  Her Chem-7  was unremarkable.  First set of cardiac enzymes was negative.  CBC was normal.  ABG revealed a pH of 7.5, pCO2 of 25.7, pO2 of 84, and bicarb of 22.7.  PHYSICAL EXAMINATION:  GENERAL:  She was lying comfortably on a stretcher in the ED surrounded by 3 family members.  She was able to speak in full sentences without any apparent distress. HEENT:  Pupils were equally round and reactive to light and accommodation.  EOMI.  Oropharynx was clear with moist mucous membranes. NECK:  Supple.  No JVP.  No lymphadenopathy. CARDIOVASCULAR:  Tachycardic.  No murmurs, rubs, or gallops. LUNGS:  Clear to auscultation bilaterally.  No wheezing, rales, or rhonchi.  She had good air movement. GI:  Her abdomen was soft, nontender, nondistended.  No organomegaly. EXTREMITIES:  No lower extremity edema. SKIN:  Warm, dry, well perfused.  However, she did have redundant skin on her  abdomen from excessive weight loss. PSYCH:  Normal mood and affect.  ASSESSMENT AND PLAN: 78. A 60 year old lady with a presumed chronic obstructive pulmonary     disease exacerbation.  Etiology is likely secondary to an     infection, which is most likely viral versus an acute coronary     syndrome.  She has currently been on antibiotics.  We will continue     her on Levaquin 500 mg IV daily for 5 days.  We will treat her with     steroids, given that she is not acutely ill.  I will treat with     prednisone 60 mg p.o. daily, albuterol and Atrovent nebulizers q.4-     6 h., and albuterol q.2 h. p.r.n., Mucinex with codeine for cough,     unresponsive to Mucinex.  Her chest x-ray did not show any acute     infiltrate. 2. Substernal chest tightness, rule out acute coronary syndrome as the     etiology of this is presumed chronic obstructive pulmonary disease     exacerbation.  She will get 3 sets of cardiac enzymes.  Now, the     first set was negative in the ED.  We will continue her on     telemetry.  She will get 81 mg of aspirin daily.  No beta-blocker,     given her pulmonary status.  Morphine p.r.n. and an EKG daily to assess for resolution of ST depressions, which were seen on the     initial EKG. 3. GI:  She had apparently undergone some workup and is continuing to     get workup for her excessive weight loss and inability or     difficulty with swallowing and keeping food down.  We will continue     her on a GI cocktail if this helps while she was in the ED.  She     should continue her outpatient workup.  It sounds like she had been     evaluated for H pylori and potentially treated up.  This should be     verified with her primary care physician or gastroenterologist. 4. Deep vein thrombosis prophylaxis with Lovenox.  She is a full code.          ______________________________ Clayborn Bigness, MD, MPH     CC/MEDQ  D:  09/07/2010  T:  09/07/2010  Job:   [id number]  Electronically Signed by Clayborn Bigness MD on 10/13/2010 12:14:35 AM

## 2010-11-18 LAB — DIFFERENTIAL
Basophils Relative: 1 % (ref 0–1)
Eosinophils Absolute: 0.1 10*3/uL (ref 0.0–0.7)
Eosinophils Relative: 1 % (ref 0–5)
Lymphs Abs: 3.2 10*3/uL (ref 0.7–4.0)
Monocytes Absolute: 0.5 10*3/uL (ref 0.1–1.0)
Monocytes Relative: 7 % (ref 3–12)
Neutrophils Relative %: 42 % — ABNORMAL LOW (ref 43–77)

## 2010-11-18 LAB — COMPREHENSIVE METABOLIC PANEL
ALT: 8 U/L (ref 0–35)
AST: 13 U/L (ref 0–37)
Albumin: 3.8 g/dL (ref 3.5–5.2)
Alkaline Phosphatase: 86 U/L (ref 39–117)
Calcium: 9.1 mg/dL (ref 8.4–10.5)
GFR calc Af Amer: >60 mL/min (ref >60)
Glucose, Bld: 94 mg/dL (ref 70–99)
Potassium: 3.8 mEq/L (ref 3.5–5.1)
Sodium: 141 mEq/L (ref 135–145)
Total Protein: 6.6 g/dL (ref 6.0–8.3)

## 2010-11-18 LAB — URINALYSIS, ROUTINE W REFLEX MICROSCOPIC
Glucose, UA: NEGATIVE mg/dL
Ketones, ur: NEGATIVE mg/dL
Protein, ur: NEGATIVE mg/dL
pH: 6 (ref 5.0–8.0)

## 2010-11-18 LAB — CBC
Hemoglobin: 12.5 g/dL (ref 12.0–15.0)
MCHC: 33.9 g/dL (ref 30.0–36.0)
RDW: 13.3 % (ref 11.5–15.5)

## 2010-11-18 LAB — URINE MICROSCOPIC-ADD ON

## 2010-11-22 LAB — DIFFERENTIAL
Basophils Absolute: 0 10*3/uL (ref 0.0–0.1)
Basophils Relative: 0 % (ref 0–1)
Eosinophils Relative: 0 % (ref 0–5)
Monocytes Absolute: 0.2 10*3/uL (ref 0.1–1.0)

## 2010-11-22 LAB — COMPREHENSIVE METABOLIC PANEL
AST: 18 U/L (ref 0–37)
Albumin: 4.3 g/dL (ref 3.5–5.2)
Alkaline Phosphatase: 89 U/L (ref 39–117)
BUN: 4 mg/dL — ABNORMAL LOW (ref 6–23)
CO2: 24 mEq/L (ref 19–32)
Chloride: 105 mEq/L (ref 96–112)
Creatinine, Ser: 0.78 mg/dL (ref 0.4–1.2)
GFR calc Af Amer: >60 mL/min (ref >60)
GFR calc non Af Amer: >60 mL/min (ref >60)
Potassium: 3.7 mEq/L (ref 3.5–5.1)
Total Bilirubin: 0.5 mg/dL (ref 0.3–1.2)

## 2010-11-22 LAB — LIPASE, BLOOD: Lipase: 22 U/L (ref 11–59)

## 2010-11-22 LAB — CBC
HCT: 39.4 % (ref 36.0–46.0)
MCV: 91.5 fL (ref 78.0–100.0)
Platelets: 206 10*3/uL (ref 150–400)
RBC: 4.3 MIL/uL (ref 3.87–5.11)
WBC: 6.5 10*3/uL (ref 4.0–10.5)

## 2010-12-07 LAB — URINALYSIS, ROUTINE W REFLEX MICROSCOPIC
Bilirubin Urine: NEGATIVE
Ketones, ur: NEGATIVE mg/dL
Nitrite: NEGATIVE
Specific Gravity, Urine: 1.015 (ref 1.005–1.030)
Urobilinogen, UA: 0.2 mg/dL (ref 0.0–1.0)

## 2010-12-07 LAB — COMPREHENSIVE METABOLIC PANEL
ALT: 9 U/L (ref 0–35)
Albumin: 3.9 g/dL (ref 3.5–5.2)
Alkaline Phosphatase: 103 U/L (ref 39–117)
BUN: 8 mg/dL (ref 6–23)
Calcium: 9.4 mg/dL (ref 8.4–10.5)
Potassium: 3.6 mEq/L (ref 3.5–5.1)
Sodium: 139 mEq/L (ref 135–145)
Total Protein: 7.3 g/dL (ref 6.0–8.3)

## 2010-12-07 LAB — CBC
MCHC: 33.5 g/dL (ref 30.0–36.0)
Platelets: 230 10*3/uL (ref 150–400)
RDW: 13.4 % (ref 11.5–15.5)

## 2010-12-07 LAB — DIFFERENTIAL
Basophils Absolute: 0.1 10*3/uL (ref 0.0–0.1)
Eosinophils Absolute: 0 10*3/uL (ref 0.0–0.7)
Eosinophils Relative: 0 % (ref 0–5)
Lymphocytes Relative: 23 % (ref 12–46)
Neutrophils Relative %: 73 % (ref 43–77)

## 2010-12-07 LAB — LIPASE, BLOOD: Lipase: 33 U/L (ref 11–59)

## 2010-12-12 LAB — COMPREHENSIVE METABOLIC PANEL
AST: 20 U/L (ref 0–37)
Alkaline Phosphatase: 101 U/L (ref 39–117)
BUN: 12 mg/dL (ref 6–23)
CO2: 27 mEq/L (ref 19–32)
Chloride: 104 mEq/L (ref 96–112)
Creatinine, Ser: 0.88 mg/dL (ref 0.4–1.2)
GFR calc Af Amer: >60 mL/min (ref >60)
GFR calc non Af Amer: >60 mL/min (ref >60)
Total Bilirubin: 0.4 mg/dL (ref 0.3–1.2)

## 2010-12-12 LAB — URINALYSIS, ROUTINE W REFLEX MICROSCOPIC
Glucose, UA: NEGATIVE mg/dL
Hgb urine dipstick: NEGATIVE
Ketones, ur: NEGATIVE mg/dL
Protein, ur: NEGATIVE mg/dL

## 2010-12-12 LAB — DIFFERENTIAL
Basophils Absolute: 0 10*3/uL (ref 0.0–0.1)
Basophils Relative: 0 % (ref 0–1)
Eosinophils Relative: 0 % (ref 0–5)
Lymphocytes Relative: 21 % (ref 12–46)

## 2010-12-12 LAB — CBC
HCT: 44 % (ref 36.0–46.0)
MCV: 90.1 fL (ref 78.0–100.0)
RBC: 4.89 MIL/uL (ref 3.87–5.11)
WBC: 7.8 10*3/uL (ref 4.0–10.5)

## 2010-12-12 LAB — LIPASE, BLOOD: Lipase: 31 U/L (ref 11–59)

## 2011-01-14 NOTE — Op Note (Signed)
NAMEVONNETTA, [name]               ACCOUNT NO.:  [id number]   MEDICAL RECORD NO.:  [id number]          PATIENT TYPE:  AMB   LOCATION:  ENDO                         FACILITY:  [facility]   PHYSICIAN:  John C. Madilyn Fireman, M.D.    DATE OF BIRTH:  [birth year]   DATE OF PROCEDURE:  06/22/2008  DATE OF DISCHARGE:                               OPERATIVE REPORT   INDICATIONS FOR PROCEDURE:  Nausea and vomiting with 30-pound weight  loss with upper GI series suggesting abnormal distal antrum and possible  abnormal duodenal bulb by report, suspicious for primary malignancy.   PROCEDURE:  The patient was placed in the left lateral decubitus  position and placed on pulse monitor with continuous low-flow oxygen  delivered by nasal cannula.  She was sedated with 50 mcg IV fentanyl and  4 mg IV Versed.  The Olympus video endoscope was advanced under direct  vision into the oropharynx and esophagus.  The esophagus was straight  and of normal caliber with the squamocolumnar line at 38 cm.  There was  no hiatal hernia, ring, stricture, or other abnormality of the GE  junction.  The stomach was entered and small amount of liquid secretions  were suctioned from the fundus.  Retroflexed view of the cardia was  unremarkable.  The fundus and body appeared normal.  The antrum showed  minimal erythema and edema, slight granularity but no significant  deformity, mass, tumor, or visible suspicion of neoplasm.  The pylorus  was somewhat tight but fairly easily allowed passage of the endoscope  tip into the duodenum without any abnormal resistance.  Both bulb and  second portion well inspected, appeared to be within normal limits with  exception of some slight granularity of the duodenal bulb.  A CLO test  was obtained from antral mucosa.  The scope was withdrawn.  The patient  returned to the recovery room in stable condition.  She tolerated the  procedure well.  There were no immediate complications.   IMPRESSION:  Mild  antral gastritis basically with normal endoscopy with  no findings correlate with the upper GI.   PLAN:  Await CLO test and possibly try her on Reglan for delayed gastric  emptying, if CLO test negative.           ______________________________  Everardo All. Madilyn Fireman, M.D.     JCH/MEDQ  D:  06/22/2008  T:  06/23/2008  Job:  [id number]

## 2011-01-17 NOTE — Op Note (Signed)
[name], [name]               ACCOUNT NO.:  [id number]   MEDICAL RECORD NO.:  [id number]          PATIENT TYPE:  AMB   LOCATION:  ENDO                         FACILITY:  [facility]   PHYSICIAN:  John C. Madilyn Fireman, M.D.    DATE OF BIRTH:  1951/08/19   DATE OF PROCEDURE:  02/26/2005  DATE OF DISCHARGE:                                 OPERATIVE REPORT   PROCEDURE PERFORMED:  Colonoscopy with polypectomy   INDICATIONS FOR PROCEDURE:  Family history of colon cancer in two first-  degree relatives.   PROCEDURE:  The patient was placed in the left lateral decubitus position  and placed on pulse monitor with continuous low-flow oxygen delivered by  nasal cannula. She was sedated with 100 mcg IV fentanyl and 6 milligrams IV  Versed. The Olympus video colonoscope was inserted into the rectum and  advanced to cecum, confirmed by transillumination of McBurney's point and  visualization of ileocecal valve and appendiceal orifice. Prep was  excellent. The cecum appeared normal with no masses, polyps, diverticula or  other mucosal abnormalities. Within the proximal transverse colon, there was  a 1.2-cm sessile polyp that was removed by snare.  It would not come through  the suction channel, but was stuck to the suction tip and was fragmented  when pulling off the scope after the scope was withdrawn.  The scope was  reinserted advanced back to the polypectomy area and we resumed inspection  of colon.  The remainder of the ascending and transverse colon appeared  normal.  In the distal descending colon about 35 cm there was an 8 mm  sessile polyp that was removed by snare. The remainder of the descending and  sigmoid colon appeared normal. There were two or three small translucent  polyps no more than 3-4 mm in diameter in rectum that I did not fulgurate.  The scope was then withdrawn and the patient returned to the recovery room  in stable condition. She tolerated the procedure well. There were no  immediate complications.   IMPRESSION:  Moderate size ascending and small descending colon polyp.   PLAN:  Await histology and will likely repeat study in three years.       JCH/MEDQ  D:  02/26/2005  T:  02/26/2005  Job:  [id number]   cc:   Dineen Kid. Reche Dixon, M.D.  [address] [place]  [place] [address]  Fax: [phone]

## 2011-01-17 NOTE — Discharge Summary (Signed)
[name], GOAR NO.:  [id number]   MEDICAL RECORD NO.:  [id number]          PATIENT TYPE:  INP   LOCATION:  6729                         FACILITY:  [facility]   PHYSICIAN:  Manning Charity, MD     DATE OF BIRTH:  [birth year]   DATE OF ADMISSION:  02/12/2005  DATE OF DISCHARGE:  02/16/2005                                 DISCHARGE SUMMARY   DISCHARGE DIAGNOSES:  1.  Nausea and vomiting secondary to H.pylori.  2.  Family history of colon cancer.   PAST MEDICAL HISTORY:  1.  COPD.  2.  Psoriasis.  3.  Status post appendectomy.  4.  Status post partial hysterectomy in 1975.  5.  History of chronic anemia.   DISCHARGE MEDICATIONS:  1.  Prilosec over-the-counter 20 mg b.i.d. for 14 days.  2.  Amoxicillin 1 gram b.i.d. for 14 days.  3.  Clarithromycin 500 mg b.i.d. for 14 days.  4.  Phenergan 25 mg q.6h p.r.n.   The patient was instructed to continue taking the rest of her medications as  before coming to the hospital.  There was some uncertainty about which  medications these included.   FOLLOW UP:  The patient is to follow up with Onalee Hua C. Reche Dixon, M.D. with  [facility] after discharge from the hospital.  She is also to see  John C. Madilyn Fireman, M.D. of [facility] for a colonoscopy after  discharge.   PROCEDURE:  The patient had an upper endoscopy performed on February 13, 2005,  which was negative for any ulcers, did reveal a hiatal hernia.  Biopsy was  taken for a CLOtest which was positive for H.pylori.  The patient had an  ultrasound of the pelvis performed on February 13, 2005, to follow up on a  previously visualized ovarian mass.  Ultrasound revealed a 1.2 cm cystic  lesion in the ovary with follow-up recommended in 4 to 6 weeks.   CONSULTATIONS:  John C. Madilyn Fireman, M.D. of gastroenterology was consulted  regarding 6 to 8 months of nausea and vomiting.  He performed endoscopy with  results as noted above.   HISTORY OF PRESENT ILLNESS:  Ms.  Fuller is a 60 year old African-American  female with past medical history significant for COPD and psoriasis as well  as chronic methotrexate therapy, who is being followed by Dineen Kid. Reche Dixon,  M.D. for medical problems and 6 to 8 months of persistent nausea and  vomiting with approximately 15 pound documented weight loss.  Multiple  attempts to obtain outpatient endoscopy were negative, therefore, the  patient was hospitalized for inpatient workup of nausea and vomiting.   ADMISSION PHYSICAL EXAMINATION:  VITAL SIGNS:  Temperature 97.2, pulse 74,  blood pressure 170/87, respiratory rate 20, and O2 saturation of 94% on room  air.  GENERAL:  She is a well-appearing female in no acute distress.  HEENT:  Eyes are equal and reactive to light.  ENT as without any lesions.  NECK:  Supple, no thyromegaly.  LUNGS:  Occasional rhonchi.  CARDIOVASCULAR:  Regular rate and rhythm with no murmurs, rubs, or gallops.  ABDOMEN:  Obese with mild tenderness in the left lower quadrant, right lower  quadrant, and suprapubically.  EXTREMITIES:  Without any edema.  SKIN:  Some macular lesions.  NEUROLOGY:  Normal.   ADMISSION LABORATORY DATA:  Sodium 135, potassium 3.5, chloride 104,  bicarbonate 27, BUN 9, creatinine 0.8, glucose 102.  CBC with white blood  cell count 6.6, hemoglobin 12.1, MCV 87.1, platelet count 264,000.   HOSPITAL COURSE:  Problem 1.  Nausea and vomiting, abdominal wall.  Dr.  Madilyn Fireman of gastroenterology was consulted.  He performed endoscopy on June 15,  with results as noted above.  Given her family history of colon cancer and  age of greater than 36, Dr. Madilyn Fireman has also agreed to see [name] as an  outpatient for colonoscopy.  She will be treated for 2 weeks for her  H.pylori infection and follow-up with Dr. Reche Dixon.   Problem 2.  Ovarian enlargement detected on CT scan.  The patient had a  pelvic ultrasound performed to evaluate the ovaries.  This revealed a cystic  lesion  as noted above and she needs a repeat ultrasound in 4 to 6 weeks to  follow this.   Problem 3.  Family history of colon cancer.  The patient is to see Dr. Madilyn Fireman  after discharge for screening colonoscopy.   All other problems were stable during the hospitalization.  Ms. Desouza is  being discharged to home with her family.   DISCHARGE LABORATORY DATA:  Sodium 140, potassium 3.8, chloride 101,  bicarbonate 27, BUN 8, creatinine 0.8, glucose 94.  WBC 5.8, hemoglobin  12.6, platelet count 256.   ISSUES PENDING AT DISCHARGE:  The patient needs follow-up for H.pylori  positive biopsy.  [name] needs a colonoscopy after discharge.  She  also needs a repeat pelvic ultrasound in 4 to 6 weeks to follow-up her  cystic mass seen in the ovaries.       KK/MEDQ  D:  02/15/2005  T:  02/17/2005  Job:  [id number]

## 2011-01-17 NOTE — Op Note (Signed)
NAMEEMINE, LOPATA               ACCOUNT NO.:  [id number]   MEDICAL RECORD NO.:  [id number]          PATIENT TYPE:  INP   LOCATION:  6729                         FACILITY:  [facility]   PHYSICIAN:  John C. Madilyn Fireman, M.D.    DATE OF BIRTH:  [birth year]   DATE OF PROCEDURE:  02/13/2005  DATE OF DISCHARGE:                                 OPERATIVE REPORT   PROCEDURE:  Esophagogastroduodenoscopy.   ENDOSCOPIST:  Everardo All. Madilyn Fireman, M.D.   INDICATIONS FOR PROCEDURE:  Nausea,  vomiting and abdominal pain.   PROCEDURE:  The patient was placed in the left lateral decubitus position  and placed on the pulse monitor with continuous low-flow oxygen delivered by  nasal cannula.  She was sedated with 50 mcg of IV fentanyl and 7.5 mg of IV  Versed.  The Olympus video endoscope was advanced under direct vision into  the oropharynx and esophagus.  The esophagus was straight and of normal  caliber with the squamocolumnar line at 36 cm above a small hiatal hernia.  The stomach was entered and a small amount of liquid secretions were  suctioned from the fundus.  A retroflexed view of the cardia confirmed a  hiatal hernia and was otherwise unremarkable.  The fundus, body and antrum  appeared normal.  The pylorus also appeared normal; it had a somewhat fixed  and open appearance.  The duodenum was entered and both bulb and second  portion were well-inspected and appeared to be within normal limits.  The  scope was then withdrawn and the patient returned to the recovery room in  stable condition.  She tolerated the procedure well and there were no  immediate complications.   IMPRESSION:  Hiatal hernia, otherwise normal study.   PLAN:  1.  Trial of a proton pump inhibitor.  If this is unsuccessful, consider      trial of Reglan for the possibility of duodenogastric reflux causing      symptoms.  2.  Await CLOtest from today's procedure and treat for eradication if      Helicobacter is positive.       JCH/MEDQ  D:  02/14/2005  T:  02/15/2005  Job:  [id number]   cc:   C. Ulyess Mort, M.D.  [address]  [place]  [place] [address]  Fax: [phone]   [employer]

## 2011-01-17 NOTE — Consult Note (Signed)
[name], [name]               ACCOUNT NO.:  [id number]   MEDICAL RECORD NO.:  [id number]          PATIENT TYPE:  INP   LOCATION:  6729                         FACILITY:  [facility]   PHYSICIAN:  John C. Madilyn Fireman, M.D.    DATE OF BIRTH:  06-24-1951   DATE OF CONSULTATION:  DATE OF DISCHARGE:                                   CONSULTATION   REASON FOR CONSULTATION:  Nausea and vomiting and abdominal pain.   HISTORY OF ILLNESS:  The patient is a 60 year old black female who presents  with a several-month history of postprandial nausea, vomiting, and abdominal  pain, with early satiety and a reported 20-pound weight loss with a  documented 7-pound weight loss.  She has been on Nexium, with no  improvement.  She has not had any melena, diarrhea, constipation, or  hematemesis.  She has had an abdominal and pelvic CT as well as a pelvic  ultrasound which had been essentially unrevealing or causes of her current  symptoms.  She has a strong family history of colon cancer in her mother and  sister, and it was hoped that admission could help facilitate EGD and  hopefully colonoscopy.   PAST MEDICAL HISTORY:  Essentially unremarkable, except for the above.   ALLERGIES:  None.   SURGERIES:  Partial hysterectomy.   SOCIAL HISTORY:  The patient is single.  She works at a [employer].  She  denies alcohol use, and smokes a pack of cigarettes a day.   PHYSICAL EXAMINATION:  GENERAL:  Well developed, well nourished, alert,  oriented, in no acute distress.  HEART:  Regular rate and rhythm, without murmur.  LUNGS:  Clear.  ABDOMEN:  Soft, nondistended, with normoactive bowel sounds.  No  hepatosplenomegaly, masses, or guarding.  No succussion splash.   LABORATORIES:  WBC 7,600, hemoglobin 12.8, hematocrit 38.7, platelets  274,000.  Amylase, lipase, and liver function tests normal.   IMPRESSION:  1.  Abdominal pain, nausea, and vomiting, with early satiety and weight      loss.  2.  Strong  family history of colon cancer.   PLAN:  Will begin workup with EGD tomorrow.  At some point, will need  colonoscopy as well.  If EGD is structurally normal, will obtain nuclear  medicine gastric emptying study.       [facility]/MED[facility]  D:  02/13/2005  T:  02/13/2005  Job:  [id number]   cc:   C. Ulyess Mort, M.D.  [address]  [place]  [place] [address]  Fax: [phone]

## 2011-05-16 ENCOUNTER — Observation Stay (HOSPITAL_COMMUNITY)
Admission: EM | Admit: 2011-05-16 | Discharge: 2011-05-16 | Disposition: A | Discharge status: Home / Self Care | Payer: Medicaid Other | Attending: Emergency Medicine | Admitting: Emergency Medicine

## 2011-05-16 ENCOUNTER — Emergency Department (HOSPITAL_COMMUNITY): Payer: Medicaid Other

## 2011-05-16 DIAGNOSIS — J4489 Other specified chronic obstructive pulmonary disease: Principal | ICD-10-CM | POA: Insufficient documentation

## 2011-05-16 DIAGNOSIS — R112 Nausea with vomiting, unspecified: Secondary | ICD-10-CM | POA: Insufficient documentation

## 2011-05-16 DIAGNOSIS — J449 Chronic obstructive pulmonary disease, unspecified: Secondary | ICD-10-CM | POA: Insufficient documentation

## 2011-05-16 LAB — CBC
HCT: 43.3 % (ref 36.0–46.0)
Platelets: 289 10*3/uL (ref 150–400)
RDW: 13.5 % (ref 11.5–15.5)
WBC: 9.7 10*3/uL (ref 4.0–10.5)

## 2011-05-16 LAB — DIFFERENTIAL
Basophils Absolute: 0 10*3/uL (ref 0.0–0.1)
Eosinophils Absolute: 0.1 10*3/uL (ref 0.0–0.7)
Lymphs Abs: 4.3 10*3/uL — ABNORMAL HIGH (ref 0.7–4.0)
Monocytes Absolute: 0.5 10*3/uL (ref 0.1–1.0)
Monocytes Relative: 5 % (ref 3–12)

## 2011-06-02 LAB — CLOTEST (H. PYLORI), BIOPSY: Helicobacter screen: POSITIVE — AB

## 2011-11-12 ENCOUNTER — Emergency Department (HOSPITAL_COMMUNITY): Payer: Medicaid Other

## 2011-11-12 ENCOUNTER — Encounter (HOSPITAL_COMMUNITY): Payer: Self-pay | Admitting: Emergency Medicine

## 2011-11-12 ENCOUNTER — Inpatient Hospital Stay (HOSPITAL_COMMUNITY)
Admission: EM | Admit: 2011-11-12 | Discharge: 2011-11-15 | DRG: 392 | Disposition: A | Discharge status: Home / Self Care | Payer: Medicaid Other | Attending: Internal Medicine | Admitting: Internal Medicine

## 2011-11-12 DIAGNOSIS — F17200 Nicotine dependence, unspecified, uncomplicated: Secondary | ICD-10-CM | POA: Diagnosis present

## 2011-11-12 DIAGNOSIS — K449 Diaphragmatic hernia without obstruction or gangrene: Secondary | ICD-10-CM | POA: Diagnosis present

## 2011-11-12 DIAGNOSIS — R109 Unspecified abdominal pain: Secondary | ICD-10-CM | POA: Diagnosis present

## 2011-11-12 DIAGNOSIS — J4489 Other specified chronic obstructive pulmonary disease: Secondary | ICD-10-CM | POA: Diagnosis present

## 2011-11-12 DIAGNOSIS — J42 Unspecified chronic bronchitis: Secondary | ICD-10-CM

## 2011-11-12 DIAGNOSIS — K219 Gastro-esophageal reflux disease without esophagitis: Secondary | ICD-10-CM | POA: Diagnosis present

## 2011-11-12 DIAGNOSIS — J449 Chronic obstructive pulmonary disease, unspecified: Secondary | ICD-10-CM

## 2011-11-12 DIAGNOSIS — L408 Other psoriasis: Secondary | ICD-10-CM | POA: Diagnosis present

## 2011-11-12 DIAGNOSIS — J441 Chronic obstructive pulmonary disease with (acute) exacerbation: Secondary | ICD-10-CM | POA: Diagnosis present

## 2011-11-12 DIAGNOSIS — Z720 Tobacco use: Secondary | ICD-10-CM | POA: Diagnosis present

## 2011-11-12 DIAGNOSIS — D1779 Benign lipomatous neoplasm of other sites: Secondary | ICD-10-CM | POA: Diagnosis present

## 2011-11-12 DIAGNOSIS — R112 Nausea with vomiting, unspecified: Principal | ICD-10-CM | POA: Diagnosis present

## 2011-11-12 DIAGNOSIS — N390 Urinary tract infection, site not specified: Secondary | ICD-10-CM | POA: Diagnosis present

## 2011-11-12 HISTORY — DX: Other psoriasis: L40.8

## 2011-11-12 HISTORY — DX: Chronic obstructive pulmonary disease, unspecified: J44.9

## 2011-11-12 HISTORY — DX: Nausea with vomiting, unspecified: R11.2

## 2011-11-12 HISTORY — DX: Personal history of colon polyps, unspecified: Z86.0100

## 2011-11-12 HISTORY — DX: Personal history of colonic polyps: Z86.010

## 2011-11-12 HISTORY — DX: Nicotine dependence, unspecified, uncomplicated: F17.200

## 2011-11-12 LAB — CBC
HCT: 39.5 % (ref 36.0–46.0)
MCV: 89.6 fL (ref 78.0–100.0)
RBC: 4.41 MIL/uL (ref 3.87–5.11)
WBC: 8.8 10*3/uL (ref 4.0–10.5)

## 2011-11-12 LAB — DIFFERENTIAL
Eosinophils Relative: 0 % (ref 0–5)
Lymphocytes Relative: 17 % (ref 12–46)
Lymphs Abs: 1.5 10*3/uL (ref 0.7–4.0)
Monocytes Absolute: 0.2 10*3/uL (ref 0.1–1.0)
Monocytes Relative: 3 % (ref 3–12)

## 2011-11-12 LAB — COMPREHENSIVE METABOLIC PANEL
Alkaline Phosphatase: 110 U/L (ref 39–117)
BUN: 11 mg/dL (ref 6–23)
Chloride: 103 mEq/L (ref 96–112)
Creatinine, Ser: 0.67 mg/dL (ref 0.50–1.10)
GFR calc Af Amer: >90 mL/min (ref >90)
Glucose, Bld: 148 mg/dL — ABNORMAL HIGH (ref 70–99)
Potassium: 3.5 mEq/L (ref 3.5–5.1)
Total Bilirubin: 0.3 mg/dL (ref 0.3–1.2)

## 2011-11-12 LAB — HEPATIC FUNCTION PANEL
ALT: 7 U/L (ref 0–35)
Alkaline Phosphatase: 112 U/L (ref 39–117)
Bilirubin, Direct: <0.1 mg/dL (ref 0.0–0.3)
Total Protein: 7.8 g/dL (ref 6.0–8.3)

## 2011-11-12 LAB — URINALYSIS, ROUTINE W REFLEX MICROSCOPIC
Bilirubin Urine: NEGATIVE
Nitrite: NEGATIVE
pH: 8.5 — ABNORMAL HIGH (ref 5.0–8.0)

## 2011-11-12 LAB — URINE MICROSCOPIC-ADD ON

## 2011-11-12 MED ORDER — HEPARIN SODIUM (PORCINE) 5000 UNIT/ML IJ SOLN
5000.0000 [IU] | Freq: Three times a day (TID) | INTRAMUSCULAR | Status: DC
  Administered 2011-11-12 – 2011-11-15 (×9): 5000 [IU] via SUBCUTANEOUS
  Filled 2011-11-12 (×12): qty 1

## 2011-11-12 MED ORDER — SODIUM CHLORIDE 0.9 % IV BOLUS (SEPSIS)
1000.0000 mL | Freq: Once | INTRAVENOUS | Status: AC
  Administered 2011-11-12: 1000 mL via INTRAVENOUS

## 2011-11-12 MED ORDER — ONDANSETRON HCL 4 MG PO TABS
4.0000 mg | ORAL_TABLET | Freq: Four times a day (QID) | ORAL | Status: DC | PRN
  Administered 2011-11-14 – 2011-11-15 (×3): 4 mg via ORAL
  Filled 2011-11-12 (×3): qty 1

## 2011-11-12 MED ORDER — CEFTRIAXONE SODIUM 1 G IJ SOLR
1.0000 g | INTRAMUSCULAR | Status: DC
  Administered 2011-11-13 – 2011-11-14 (×2): 1 g via INTRAVENOUS
  Filled 2011-11-12 (×3): qty 10

## 2011-11-12 MED ORDER — ONDANSETRON HCL 4 MG/2ML IJ SOLN
4.0000 mg | Freq: Four times a day (QID) | INTRAMUSCULAR | Status: DC | PRN
  Administered 2011-11-12 – 2011-11-13 (×2): 4 mg via INTRAVENOUS
  Filled 2011-11-12 (×2): qty 2

## 2011-11-12 MED ORDER — ONDANSETRON HCL 4 MG/2ML IJ SOLN
4.0000 mg | Freq: Once | INTRAMUSCULAR | Status: AC
  Administered 2011-11-12: 4 mg via INTRAVENOUS
  Filled 2011-11-12: qty 2

## 2011-11-12 MED ORDER — CEFTRIAXONE SODIUM 1 G IJ SOLR
1.0000 g | Freq: Once | INTRAMUSCULAR | Status: AC
  Administered 2011-11-12: 1 g via INTRAVENOUS
  Filled 2011-11-12: qty 10

## 2011-11-12 MED ORDER — HYDROCODONE-ACETAMINOPHEN 5-325 MG PO TABS
1.0000 | ORAL_TABLET | ORAL | Status: DC | PRN
  Administered 2011-11-12 – 2011-11-15 (×6): 2 via ORAL
  Filled 2011-11-12 (×6): qty 2

## 2011-11-12 MED ORDER — PROMETHAZINE HCL 25 MG PO TABS
25.0000 mg | ORAL_TABLET | Freq: Four times a day (QID) | ORAL | Status: DC | PRN
  Administered 2011-11-13: 25 mg via ORAL
  Filled 2011-11-12: qty 1

## 2011-11-12 MED ORDER — POTASSIUM CHLORIDE IN NACL 20-0.9 MEQ/L-% IV SOLN
INTRAVENOUS | Status: DC
  Administered 2011-11-12: 1000 mL via INTRAVENOUS
  Administered 2011-11-13: 04:00:00 via INTRAVENOUS
  Filled 2011-11-12 (×2): qty 1000

## 2011-11-12 MED ORDER — MORPHINE SULFATE 4 MG/ML IJ SOLN
4.0000 mg | Freq: Once | INTRAMUSCULAR | Status: AC
  Administered 2011-11-12: 4 mg via INTRAVENOUS
  Filled 2011-11-12: qty 1

## 2011-11-12 MED ORDER — ALBUTEROL SULFATE (5 MG/ML) 0.5% IN NEBU: 2.5000 mg | INHALATION_SOLUTION | RESPIRATORY_TRACT | Status: DC | PRN

## 2011-11-12 MED ORDER — TIOTROPIUM BROMIDE MONOHYDRATE 18 MCG IN CAPS
18.0000 ug | ORAL_CAPSULE | Freq: Every day | RESPIRATORY_TRACT | Status: DC
  Administered 2011-11-13 – 2011-11-15 (×3): 18 ug via RESPIRATORY_TRACT
  Filled 2011-11-12: qty 5

## 2011-11-12 MED ORDER — GUAIFENESIN-DM 100-10 MG/5ML PO SYRP: 5.0000 mL | ORAL_SOLUTION | ORAL | Status: DC | PRN

## 2011-11-12 NOTE — ED Provider Notes (Signed)
History     CSN: [id number]  Arrival date & time 11/12/11  0034   First MD Initiated Contact with Patient 11/12/11 0504      No chief complaint on file.   (Consider location/radiation/quality/duration/timing/severity/associated sxs/prior treatment) Patient is a 61 y.o. female presenting with vomiting and abdominal pain. The history is provided by the patient. No language interpreter was used.  Emesis  This is a recurrent problem. The current episode started yesterday. The problem occurs 5 to 10 times per day. The problem has not changed since onset.The emesis has an appearance of stomach contents. There has been no fever. Associated symptoms include abdominal pain. Pertinent negatives include no arthralgias, no chills, no cough, no diarrhea, no fever, no headaches, no myalgias, no sweats and no URI. Risk factors: recurrent vomiting.  Abdominal Pain The primary symptoms of the illness include abdominal pain and vomiting. The primary symptoms of the illness do not include fever or diarrhea. The current episode started yesterday. The onset of the illness was gradual. The problem has not changed since onset. The abdominal pain began yesterday. The pain came on gradually. The abdominal pain has been unchanged since its onset. The abdominal pain is located in the suprapubic region. The abdominal pain does not radiate. The severity of the abdominal pain is 8/10. The abdominal pain is relieved by nothing. Exacerbated by: nothing.  Associated with: nothing. The patient has not had a change in bowel habit. Risk factors: recurrent episodes of vomiting. Symptoms associated with the illness do not include chills. Significant associated medical issues do not include HIV.    History reviewed. No pertinent past medical history.  Past Surgical History  Procedure Date  . Hernia repair   . Partial hysterectomy   . Tonsillectomy   . Appendectomy     Family History  Problem Relation Age of Onset  .  Cancer Other   . Coronary artery disease Other     History  Substance Use Topics  . Smoking status: Current Some Day Smoker  . Smokeless tobacco: Not on file  . Alcohol Use: No    OB History    Grav Para Term Preterm Abortions TAB SAB Ect Mult Living                  Review of Systems  Constitutional: Negative for fever and chills.  HENT: Negative.   Eyes: Negative.   Respiratory: Negative for cough.   Gastrointestinal: Positive for vomiting and abdominal pain. Negative for diarrhea.  Genitourinary: Negative.   Musculoskeletal: Negative for myalgias and arthralgias.  Neurological: Negative for headaches.  Hematological: Negative.   Psychiatric/Behavioral: Negative.     Allergies  Review of patient's allergies indicates no known allergies.  Home Medications   Current Outpatient Rx  Name Route Sig Dispense Refill  . ALBUTEROL SULFATE HFA 108 (90 BASE) MCG/ACT IN AERS Inhalation Inhale 2 puffs into the lungs every 6 (six) hours as needed.    Marland Kitchen PROMETHAZINE HCL 25 MG PO TABS Oral Take 25 mg by mouth every 6 (six) hours as needed. nausea    . TIOTROPIUM BROMIDE MONOHYDRATE 18 MCG IN CAPS Inhalation Place 18 mcg into inhaler and inhale daily.      BP 155/95  Pulse 82  Temp(Src) 98.5 F (36.9 C) (Oral)  Resp 16  SpO2 100%  Physical Exam  Constitutional: She is oriented to person, place, and time. She appears well-developed and well-nourished. No distress.  HENT:  Head: Normocephalic and atraumatic.  Eyes: Conjunctivae are  normal. Pupils are equal, round, and reactive to light.  Neck: Normal range of motion. Neck supple.  Cardiovascular: Normal rate and regular rhythm.   Pulmonary/Chest: Effort normal and breath sounds normal. She has no wheezes. She has no rales.  Abdominal: Soft. Bowel sounds are normal. There is no tenderness. There is no rebound and no guarding.  Musculoskeletal: Normal range of motion.  Neurological: She is alert and oriented to person, place,  and time.  Skin: Skin is warm and dry.  Psychiatric: She has a normal mood and affect.    ED Course  Procedures (including critical care time)  Labs Reviewed  DIFFERENTIAL - Abnormal; Notable for the following:    Neutrophils Relative 80 (*)    All other components within normal limits  CBC  HEPATIC FUNCTION PANEL  URINALYSIS, ROUTINE W REFLEX MICROSCOPIC  URINE CULTURE  COMPREHENSIVE METABOLIC PANEL   Dg Abd Acute W/chest  11/12/2011  *RADIOLOGY REPORT*  Clinical Data: Nausea, vomiting.  ACUTE ABDOMEN SERIES (ABDOMEN 2 VIEW & CHEST 1 VIEW)  Comparison: 01/30/2010 CT  Findings: Lungs are predominately clear.  Cardiomediastinal contours within normal limits.  Relative paucity of small bowel gas.  There is air present within the rectum.  Calcific densities projecting over the lateral pelvis, in keeping with injection granuloma.  No acute osseous abnormality. Right greater than left hip DJD.  No free intraperitoneal air visualized on the decubitus view. Organ outlines normal where seen.  IMPRESSION: Nonobstructive bowel gas pattern.  Original Report Authenticated By: Waneta Martins, M.D.     No diagnosis found.    MDM  Labs and Xray reassuring no indication for CT Po challenging        Liesl Simons K Kenley Rettinger-Rasch, MD 11/12/11 [phone]

## 2011-11-12 NOTE — ED Notes (Signed)
Hospitalist at bedside 

## 2011-11-12 NOTE — ED Provider Notes (Signed)
Per nursing staff pt requesting pain medication for her abdominal pain for which she has been previously evaluated. CT A/P unremarkable. Morphine ordered.  Forbes Cellar, MD 11/12/11 [phone]

## 2011-11-12 NOTE — H&P (Signed)
[name] CSN:[id number],MRN:[id number]  Outpatient Primary MD for the patient is No primary provider on file.  With History of -  Past Medical History  Diagnosis Date  . Nausea & vomiting   . COPD (chronic obstructive pulmonary disease)   . Smoker   . PSORIASIS 12/29/2006    Qualifier: Diagnosis of  By: Reche Dixon MD, Onalee Hua        Past Surgical History  Procedure Date  . Hernia repair   . Partial hysterectomy   . Tonsillectomy   . Appendectomy     in for   Chief Complaint  Patient presents with  . Abdominal Pain     HPI  [name]  is a 61 y.o. female, hiatal hernia, reflux, recurrent nausea and vomiting for the last couple of years, comes in with another episode of recurrent nausea and vomiting, in the ER small bowel series unremarkable labs unremarkable except for questionable urinary tract infection and I was called to admit the patient for nausea and vomiting. Patient states that she gets these attacks once a month for the last 2 years, at times she has a dull constant nonradiating suprapubic abdominal discomfort, she has had her appendix taken out as a teenager, Says she may still have her gallbladder, denies any diarrhea denies any blood in stool or urine she does say that she has lost 40-50 pounds of weight in the last couple of years and attributes that to her recurrent nausea and vomiting.    Review of Systems    In addition to the HPI above,  No Fever-chills, No Headache, No changes with Vision or hearing, No problems swallowing food or Liquids, No Chest pain, Cough or Shortness of Breath, + lower Abdominal pain, + Nausea & Vommitting, Bowel movements are regular, No Blood in stool or Urine, No dysuria, No new skin rashes or bruises, No new joints pains-aches,  No new weakness, tingling, numbness in any extremity, No polyuria, polydypsia or polyphagia, No significant Mental Stressors.  A full 10 point Review of Systems was done, except as stated above,  all other Review of Systems were negative.   Social History History  Substance Use Topics  . Smoking status: Current Some Day Smoker  . Smokeless tobacco: Not on file  . Alcohol Use: No      Family History Family History  Problem Relation Age of Onset  . Cancer Other   . Coronary artery disease Other       Prior to Admission medications   Medication Sig Start Date End Date Taking? Authorizing Provider  albuterol (PROVENTIL HFA;VENTOLIN HFA) 108 (90 BASE) MCG/ACT inhaler Inhale 2 puffs into the lungs every 6 (six) hours as needed.   Yes Historical Provider, MD  promethazine (PHENERGAN) 25 MG tablet Take 25 mg by mouth every 6 (six) hours as needed. nausea   Yes Historical Provider, MD  tiotropium (SPIRIVA) 18 MCG inhalation capsule Place 18 mcg into inhaler and inhale daily.   Yes Historical Provider, MD    No Known Allergies  Physical Exam  Vitals  Blood pressure 157/86, pulse 93, temperature 98.4 F (36.9 C), temperature source Oral, resp. rate 14, SpO2 100.00%.   1. General middle aged AA female lying in bed in NAD,    2. Normal affect and insight, Not Suicidal or Homicidal, Awake Alert, Oriented *3.  3. No F.N deficits, ALL C.Nerves Intact, Strength 5/5 all 4 extremities, Sensation intact all 4 extremities, Plantars down going.  4. Ears and Eyes appear Normal, Conjunctivae clear,  PERRLA. Moist Oral Mucosa.  5. Supple Neck, No JVD, No cervical lymphadenopathy appriciated, No Carotid Bruits.  6. Symmetrical Chest wall movement, Good air movement bilaterally, CTAB.  7. RRR, No Gallops, Rubs or Murmurs, No Parasternal Heave.  8. Positive Bowel Sounds, Abdomen Soft, Non tender, No organomegaly appriciated,       No rebound -guarding or rigidity.  9.  No Cyanosis, Normal Skin Turgor, No Skin Rash or Bruise.  10. Good muscle tone,  joints appear normal , no effusions, Normal ROM.  11. No Palpable Lymph Nodes in Neck or Axillae     Data Review  CBC  Lab  11/12/11 0530  WBC 8.8  HGB 13.1  HCT 39.5  PLT 268  MCV 89.6  MCH 29.7  MCHC 33.2  RDW 13.4  LYMPHSABS 1.5  MONOABS 0.2  EOSABS 0.0  BASOSABS 0.0  BANDABS --   ------------------------------------------------------------------------------------------------------------------ Chemistries   Lab 11/12/11 0530  NA 141  K 3.5  CL 103  CO2 24  GLUCOSE 148*  BUN 11  CREATININE 0.67  CALCIUM 10.1  MG --  AST 1616  ALT 77  ALKPHOS 112110  BILITOT 0.30.3   ------------------------------------------------------------------------------------------------------------------ CrCl is unknown because there is no height on file for the current visit. ------------------------------------------------------------------------------------------------------------------ No results found for this basename: TSH,T4TOTAL,FREET3,T3FREE,THYROIDAB in the last 72 hours  Coagulation profile No results found for this basename: INR:5,PROTIME:5 in the last 168 hours ------------------------------------------------------------------------------------------------------------------- No results found for this basename: DDIMER:2 in the last 72 hours ------------------------------------------------------------------------------------------------------------------- Cardiac Enzymes No results found for this basename: CK:3,CKMB:3,TROPONINI:3,MYOGLOBIN:3 in the last 168 hours ------------------------------------------------------------------------------------------------------------------ No components found with this basename: POCBNP:3 ------------------------------------------------------------------------------------------------------------------  Imaging results:   Dg Abd Acute W/chest  11/12/2011  *RADIOLOGY REPORT*  Clinical Data: Nausea, vomiting.  ACUTE ABDOMEN SERIES (ABDOMEN 2 VIEW & CHEST 1 VIEW)  Comparison: 01/30/2010 CT  Findings: Lungs are predominately clear.  Cardiomediastinal contours  within normal limits.  Relative paucity of small bowel gas.  There is air present within the rectum.  Calcific densities projecting over the lateral pelvis, in keeping with injection granuloma.  No acute osseous abnormality. Right greater than left hip DJD.  No free intraperitoneal air visualized on the decubitus view. Organ outlines normal where seen.  IMPRESSION: Nonobstructive bowel gas pattern.  Original Report Authenticated By: Waneta Martins, M.D.      Assessment & Plan  1. Recurrent nausea and vomiting for the last several years - patient has history of reflux and hiatal hernia, will admit the patient keep her on clear liquid diet, check her lipase, her KUB is unremarkable, gentle IV fluids and IV Zofran when necessary, will have GI evaluate the patient as this seems to be a recurrent problem, patient may need EGD plus minus gastric emptying study.  2. Possible mild UTI will check urine cultures IV Rocephin for total of 3 days.  3. History of COPD and psoriasis no acute issues outpatient monitor patient counseled to small stop smoking when necessary nebulizer treatments.   DVT Prophylaxis Heparin   AM Labs Ordered, also please review Full Orders  Admission, patients condition and plan of care including tests being ordered have been discussed with the patient   who indicates understanding and agree with the plan and Code Status.  Code Status Full  Condition Stable  Leroy Sea M.D on 11/12/2011 at 10:14 AM  [facility] Office  [phone]

## 2011-11-12 NOTE — Progress Notes (Signed)
UR completed 

## 2011-11-12 NOTE — ED Notes (Signed)
Pt states she has had vomiting since around noon yesterday  Pt also c/o abd pain in the lower abdomen  Denies diarrhea

## 2011-11-13 ENCOUNTER — Inpatient Hospital Stay (HOSPITAL_COMMUNITY): Payer: Medicaid Other

## 2011-11-13 LAB — BASIC METABOLIC PANEL
BUN: 8 mg/dL (ref 6–23)
CO2: 26 mEq/L (ref 19–32)
Glucose, Bld: 107 mg/dL — ABNORMAL HIGH (ref 70–99)
Potassium: 3.5 mEq/L (ref 3.5–5.1)
Sodium: 140 mEq/L (ref 135–145)

## 2011-11-13 LAB — URINE CULTURE: Colony Count: 55000

## 2011-11-13 LAB — CBC
Hemoglobin: 11 g/dL — ABNORMAL LOW (ref 12.0–15.0)
MCH: 29.8 pg (ref 26.0–34.0)
MCHC: 32.8 g/dL (ref 30.0–36.0)
MCV: 90.8 fL (ref 78.0–100.0)
RBC: 3.69 MIL/uL — ABNORMAL LOW (ref 3.87–5.11)

## 2011-11-13 MED ORDER — GADOBENATE DIMEGLUMINE 529 MG/ML IV SOLN
13.0000 mL | Freq: Once | INTRAVENOUS | Status: AC | PRN
  Administered 2011-11-13: 13 mL via INTRAVENOUS

## 2011-11-13 MED ORDER — ONDANSETRON HCL 4 MG/2ML IJ SOLN
4.0000 mg | Freq: Three times a day (TID) | INTRAMUSCULAR | Status: AC
  Administered 2011-11-13 – 2011-11-14 (×5): 4 mg via INTRAVENOUS
  Filled 2011-11-13 (×5): qty 2

## 2011-11-13 MED ORDER — LORAZEPAM 2 MG/ML IJ SOLN
1.0000 mg | Freq: Once | INTRAMUSCULAR | Status: AC
  Administered 2011-11-13: 1 mg via INTRAVENOUS
  Filled 2011-11-13: qty 1

## 2011-11-13 NOTE — Consult Note (Signed)
[facility] Gastroenterology Consultation Note  Referring Provider: Dr. Susa Raring [facility])  Reason for Consultation:  Nausea, vomiting  HPI: [name] is a 61 y.o. female admitted for nausea + vomiting.  For the past two years, patient reports constant nausea and 2-3 episodes of vomiting per day.  She has some lower abdominal pain as well, seems worse after prolonged vomiting spells.  She has lost over 50 lbs due to her progressive decreased ability to eat.  No dysphagia, GERD, blood in stool, change in bowel habits.  To evaluate her symptoms, she has had abdominal ultrasound, HIDA scan, gastric emptying study, CT scan abdomen+ pelvis, and endoscopy, all within the past 2-3 years, all essentially normal.  Over the past few years, concordant with her nausea + vomiting symptoms, she has had some worsening headaches. Mother and sister with colon cancer; she has never had a colonoscopy before.   Past Medical History  Diagnosis Date  . Nausea & vomiting   . COPD (chronic obstructive pulmonary disease)   . Smoker   . PSORIASIS 12/29/2006    Qualifier: Diagnosis of  By: Reche Dixon MD, Onalee Hua    . Hx of colonic polyps   . Asthma     Past Surgical History  Procedure Date  . Partial hysterectomy   . Tonsillectomy   . Appendectomy   . Colonoscopy w/ polypectomy 02/26/2005  . Upper gastrointestinal endoscopy 03/22/2010, 02/13/2005  . Hernia repair     x 2    Prior to Admission medications   Medication Sig Start Date End Date Taking? Authorizing Provider  albuterol (PROVENTIL HFA;VENTOLIN HFA) 108 (90 BASE) MCG/ACT inhaler Inhale 2 puffs into the lungs every 6 (six) hours as needed.   Yes Historical Provider, MD  promethazine (PHENERGAN) 25 MG tablet Take 25 mg by mouth every 6 (six) hours as needed. nausea   Yes Historical Provider, MD  tiotropium (SPIRIVA) 18 MCG inhalation capsule Place 18 mcg into inhaler and inhale daily.   Yes Historical Provider, MD    Current Facility-Administered Medications   Medication Dose Route Frequency Provider Last Rate Last Dose  . albuterol (PROVENTIL) (5 MG/ML) 0.5% nebulizer solution 2.5 mg  2.5 mg Nebulization Q2H PRN Leroy Sea, MD      . cefTRIAXone (ROCEPHIN) 1 g in dextrose 5 % 50 mL IVPB  1 g Intravenous Q24H Leroy Sea, MD   1 g at 11/13/11 0546  . guaiFENesin-dextromethorphan (ROBITUSSIN DM) 100-10 MG/5ML syrup 5 mL  5 mL Oral Q4H PRN Leroy Sea, MD      . heparin injection 5,000 Units  5,000 Units Subcutaneous Q8H Leroy Sea, MD   5,000 Units at 11/13/11 0546  . HYDROcodone-acetaminophen (NORCO) 5-325 MG per tablet 1-2 tablet  1-2 tablet Oral Q4H PRN Leroy Sea, MD   2 tablet at 11/13/11 0545  . ondansetron (ZOFRAN) tablet 4 mg  4 mg Oral Q6H PRN Leroy Sea, MD       Or  . ondansetron (ZOFRAN) injection 4 mg  4 mg Intravenous Q6H PRN Leroy Sea, MD   4 mg at 11/13/11 1109  . promethazine (PHENERGAN) tablet 25 mg  25 mg Oral Q6H PRN Leroy Sea, MD   25 mg at 11/13/11 1317  . tiotropium (SPIRIVA) inhalation capsule 18 mcg  18 mcg Inhalation Daily Leroy Sea, MD   18 mcg at 11/13/11 0819  . DISCONTD: 0.9 % NaCl with KCl 20 mEq/ L  infusion   Intravenous Continuous Prashant  Curlene Labrum, MD 75 mL/hr at 11/13/11 0356      Allergies as of 11/12/2011  . (No Known Allergies)    Family History  Problem Relation Age of Onset  . Cancer Other   . Coronary artery disease Other     History   Social History  . Marital Status: Single    Spouse Name: N/A    Number of Children: N/A  . Years of Education: N/A   Occupational History  . Not on file.   Social History Main Topics  . Smoking status: Current Some Day Smoker -- 0.2 packs/day  . Smokeless tobacco: Never Used  . Alcohol Use: No     Quit alcohol in 1992  . Drug Use: Yes    Special: Marijuana  . Sexually Active: No   Other Topics Concern  . Not on file   Social History Narrative  . No narrative on file    Review of Systems:  Positive for: anorexia, fatigue, weakness, malaise, involuntary weight loss, headaches Gen: Denies any fever, chills, rigors, night sweats, , and sleep disorder CV: Denies chest pain, angina, palpitations, syncope, orthopnea, PND, peripheral edema, and claudication. Resp: Denies dyspnea, cough, sputum, wheezing, coughing up blood. GI: Described in detail in HPI.    GU : Denies urinary burning, blood in urine, urinary frequency, urinary hesitancy, nocturnal urination, and urinary incontinence. MS: Denies joint pain or swelling.  Denies muscle weakness, cramps, atrophy.  Derm: Denies rash, itching, oral ulcerations, hives, unhealing ulcers.  Psych: Denies depression, anxiety, memory loss, suicidal ideation, hallucinations,  and confusion. Heme: Denies bruising, bleeding, and enlarged lymph nodes. Neuro:  Denies, dizziness, paresthesias. Endo:  Denies any problems with DM, thyroid, adrenal function.  Physical Exam: Vital signs in last 24 hours: Temp:  [98.2 F (36.8 C)-98.4 F (36.9 C)] 98.2 F (36.8 C) (03/14 0604) Pulse Rate:  [71-84] 71  (03/14 0604) Resp:  [16-18] 16  (03/14 0604) BP: (136-150)/(89) 136/89 mmHg (03/14 0604) SpO2:  [97 %-100 %] 97 % (03/14 0819) Weight:  [66.1 kg (145 lb 11.6 oz)] 66.1 kg (145 lb 11.6 oz) (03/14 0604) Last BM Date: 11/09/11 General:   Alert,  Well-developed, well-nourished, pleasant and cooperative in NAD Head:  Normocephalic and atraumatic. Eyes:  Sclera clear, no icterus.   Conjunctiva pink. Ears:  Normal auditory acuity. Nose:  No deformity, discharge,  or lesions. Mouth:  No deformity or lesions.  Oropharynx pink & moist. Neck:  Supple; no masses or thyromegaly. Lungs:  Clear throughout to auscultation.   No wheezes, crackles, or rhonchi. No acute distress. Heart:  Regular rate and rhythm; no murmurs, clicks, rubs,  or gallops. Abdomen:  Soft, nontender and nondistended. No masses, hepatosplenomegaly or hernias noted. Normal bowel sounds,  without guarding, and without rebound.     Msk:  Symmetrical without gross deformities. Normal posture. Pulses:  Normal pulses noted. Extremities:  Without clubbing or edema. Neurologic:  Alert and  oriented x4;  grossly normal neurologically. Skin:  Intact without significant lesions or rashes. Psych:  Alert and cooperative. Normal mood and affect.   Lab Results:  Basename 11/13/11 0535 11/12/11 0530  WBC 8.5 8.8  HGB 11.0* 13.1  HCT 33.5* 39.5  PLT 224 268   BMET  Basename 11/13/11 0535 11/12/11 0530  NA 140 141  K 3.5 3.5  CL 107 103  CO2 26 24  GLUCOSE 107* 148*  BUN 8 11  CREATININE 0.72 0.67  CALCIUM 8.8 10.1   LFT  Basename 11/12/11 0530  PROT 7.87.8  ALBUMIN 4.44.3  AST 1616  ALT 77  ALKPHOS 112110  BILITOT 0.30.3  BILIDIR <0.1  IBILI NOT CALCULATED   PT/INR No results found for this basename: LABPROT:2,INR:2 in the last 72 hours  Studies/Results: Dg Abd Acute W/chest  11/12/2011  *RADIOLOGY REPORT*  Clinical Data: Nausea, vomiting.  ACUTE ABDOMEN SERIES (ABDOMEN 2 VIEW & CHEST 1 VIEW)  Comparison: 01/30/2010 CT  Findings: Lungs are predominately clear.  Cardiomediastinal contours within normal limits.  Relative paucity of small bowel gas.  There is air present within the rectum.  Calcific densities projecting over the lateral pelvis, in keeping with injection granuloma.  No acute osseous abnormality. Right greater than left hip DJD.  No free intraperitoneal air visualized on the decubitus view. Organ outlines normal where seen.  IMPRESSION: Nonobstructive bowel gas pattern.  Original Report Authenticated By: Waneta Martins, M.D.    Impression:  1.  Nausea/vomiting.  Extensive GI tract evaluation unrevealing. 2.  Weight loss, unintentional, due to nausea + vomiting. 3.  Headaches.  Plan:  1.  Scheduled antiemetics. 2.  PPI. 3.  Labs:  TSH, cortisol, celiac serologies, ESR. 4.  MRI brain. 5.  Don't think there is much utility in repeat GI  tract evaluation at this time, pending labs and MRI findings.  If all above unrevealing, consider CT enterography (fairly low yield). 6.  Case discussed with Dr. Thedore Mins.   LOS: 1 day   Raylen Ken M  11/13/2011, 1:18 PM

## 2011-11-13 NOTE — Progress Notes (Signed)
[name] CSN:[id number],MRN:[id number] is a 61 y.o. female,  Outpatient Primary MD for the patient is No primary provider on file.  Chief Complaint  Patient presents with  . Abdominal Pain        Subjective:   [name] today has, No headache, No chest pain, No abdominal pain - No Nausea, No new weakness tingling or numbness, No Cough - SOB.    Objective:   Filed Vitals:   11/12/11 1152 11/12/11 2215 11/13/11 0604 11/13/11 0819  BP: 167/91 150/89 136/89   Pulse: 90 84 71   Temp: 98.9 F (37.2 C) 98.4 F (36.9 C) 98.2 F (36.8 C)   TempSrc: Oral Oral Oral   Resp: 16 18 16    Height:   5\' 5"  (1.651 m)   Weight:   66.1 kg (145 lb 11.6 oz)   SpO2: 100% 100% 99% 97%    Wt Readings from Last 3 Encounters:  11/13/11 66.1 kg (145 lb 11.6 oz)     Intake/Output Summary (Last 24 hours) at 11/13/11 1255 Last data filed at 11/13/11 0833  Gross per 24 hour  Intake 1562.5 ml  Output      0 ml  Net 1562.5 ml    Exam Awake Alert, Oriented *3, No new F.N deficits, Normal affect [place].AT,PERRAL Supple Neck,No JVD, No cervical lymphadenopathy appriciated.  Symmetrical Chest wall movement, Good air movement bilaterally, CTAB RRR,No Gallops,Rubs or new Murmurs, No Parasternal Heave +ve B.Sounds, Abd Soft, Non tender, No organomegaly appriciated, No rebound -guarding or rigidity. No Cyanosis, Clubbing or edema, No new Rash or bruise     Data Review  CBC  Lab 11/13/11 0535 11/12/11 0530  WBC 8.5 8.8  HGB 11.0* 13.1  HCT 33.5* 39.5  PLT 224 268  MCV 90.8 89.6  MCH 29.8 29.7  MCHC 32.8 33.2  RDW 13.9 13.4  LYMPHSABS -- 1.5  MONOABS -- 0.2  EOSABS -- 0.0  BASOSABS -- 0.0  BANDABS -- --    Chemistries   Lab 11/13/11 0535 11/12/11 0530  NA 140 141  K 3.5 3.5  CL 107 103  CO2 26 24  GLUCOSE 107* 148*  BUN 8 11  CREATININE 0.72 0.67  CALCIUM 8.8 10.1  MG -- --  AST -- 1616  ALT -- 77  ALKPHOS -- 112110  BILITOT -- 0.30.3    ------------------------------------------------------------------------------------------------------------------ estimated creatinine clearance is 69 ml/min (by C-G formula based on Cr of 0.72). ------------------------------------------------------------------------------------------------------------------  Basename 11/12/11 0530  HGBA1C 5.5   ------------------------------------------------------------------------------------------------------------------ No results found for this basename: CHOL:2,HDL:2,LDLCALC:2,TRIG:2,CHOLHDL:2,LDLDIRECT:2 in the last 72 hours ------------------------------------------------------------------------------------------------------------------ No results found for this basename: TSH,T4TOTAL,FREET3,T3FREE,THYROIDAB in the last 72 hours ------------------------------------------------------------------------------------------------------------------ No results found for this basename: VITAMINB12:2,FOLATE:2,FERRITIN:2,TIBC:2,IRON:2,RETICCTPCT:2 in the last 72 hours  Coagulation profile No results found for this basename: INR:5,PROTIME:5 in the last 168 hours  No results found for this basename: DDIMER:2 in the last 72 hours  Cardiac Enzymes No results found for this basename: CK:3,CKMB:3,TROPONINI:3,MYOGLOBIN:3 in the last 168 hours ------------------------------------------------------------------------------------------------------------------ No components found with this basename: POCBNP:3  Micro Results Recent Results (from the past 240 hour(s))  URINE CULTURE     Status: Normal   Collection Time   11/12/11  6:09 AM      Component Value Range Status Comment   Specimen Description URINE, RANDOM   Final    Special Requests NONE   Final    Culture  Setup Time [id number]   Final    Colony Count 55,000 COLONIES/ML   Final    Culture  Final    Value: Multiple bacterial morphotypes present, none predominant. Suggest appropriate recollection  if clinically indicated.   Report Status 11/13/2011 FINAL   Final     Radiology Reports Dg Abd Acute W/chest  11/12/2011  *RADIOLOGY REPORT*  Clinical Data: Nausea, vomiting.  ACUTE ABDOMEN SERIES (ABDOMEN 2 VIEW & CHEST 1 VIEW)  Comparison: 01/30/2010 CT  Findings: Lungs are predominately clear.  Cardiomediastinal contours within normal limits.  Relative paucity of small bowel gas.  There is air present within the rectum.  Calcific densities projecting over the lateral pelvis, in keeping with injection granuloma.  No acute osseous abnormality. Right greater than left hip DJD.  No free intraperitoneal air visualized on the decubitus view. Organ outlines normal where seen.  IMPRESSION: Nonobstructive bowel gas pattern.  Original Report Authenticated By: Waneta Martins, M.D.    Scheduled Meds:   . cefTRIAXone (ROCEPHIN)  IV  1 g Intravenous Q24H  . heparin  5,000 Units Subcutaneous Q8H  . tiotropium  18 mcg Inhalation Daily   Continuous Infusions:   . DISCONTD: 0.9 % NaCl with KCl 20 mEq / L 75 mL/hr at 11/13/11 0356   PRN Meds:.albuterol, guaiFENesin-dextromethorphan, HYDROcodone-acetaminophen, ondansetron (ZOFRAN) IV, ondansetron, promethazine  Assessment & Plan    1. Recurrent nausea and vomiting for the last several years - patient has history of reflux and hiatal hernia,  stable lipase and lytes, her KUB is unremarkable, PRN IV Zofran when necessary, feels better, advance diet, GI Dr Dulce Sellar to see today.    2. Possible mild UTI - follow urine cultures IV Rocephin for total of 3 days.    3. History of COPD and psoriasis no acute issues outpatient monitor patient counseled to small stop smoking when necessary nebulizer treatments.   DVT Prophylaxis    Heparin       Leroy Sea M.D on 11/13/2011 at 12:55 PM  [facility] Office  [phone]

## 2011-11-13 NOTE — Progress Notes (Signed)
Pt unable to tol MRI afraid of closed in places. Text page sent to Dr,. Singh and new orders noted.  Ativan taken to MRI for pt.

## 2011-11-14 ENCOUNTER — Inpatient Hospital Stay (HOSPITAL_COMMUNITY): Payer: Medicaid Other

## 2011-11-14 LAB — IGG, IGA, IGM
IgA: 162 mg/dL (ref 69–380)
IgG (Immunoglobin G), Serum: 862 mg/dL (ref 690–1700)
IgM, Serum: 31 mg/dL — ABNORMAL LOW (ref 52–322)

## 2011-11-14 NOTE — Progress Notes (Signed)
[name] CSN:[id number],MRN:[id number] is a 61 y.o. female,  Outpatient Primary MD for the patient is No primary provider on file.  Chief Complaint  Patient presents with  . Abdominal Pain        Subjective:   [name] today has, No headache, No chest pain, mild intermittent abdominal pain & Nausea, No new weakness tingling or numbness, No Cough - SOB.    Objective:   Filed Vitals:   11/13/11 1348 11/13/11 2215 11/14/11 0602 11/14/11 0822  BP: 149/85 148/80 137/79   Pulse: 102 94 70   Temp: 98.4 F (36.9 C) 98.6 F (37 C) 98.5 F (36.9 C)   TempSrc: Oral Oral Oral   Resp: 18 18 18    Height:      Weight:   66.3 kg (146 lb 2.6 oz)   SpO2: 100% 99% 99% 98%    Wt Readings from Last 3 Encounters:  11/14/11 66.3 kg (146 lb 2.6 oz)     Intake/Output Summary (Last 24 hours) at 11/14/11 1021 Last data filed at 11/13/11 1418  Gross per 24 hour  Intake  674.5 ml  Output      0 ml  Net  674.5 ml    Exam Awake Alert, Oriented *3, No new F.N deficits, Normal affect [place].AT,PERRAL Supple Neck,No JVD, No cervical lymphadenopathy appriciated.  Symmetrical Chest wall movement, Good air movement bilaterally, CTAB RRR,No Gallops,Rubs or new Murmurs, No Parasternal Heave +ve B.Sounds, Abd Soft, Non tender, No organomegaly appriciated, No rebound -guarding or rigidity. No Cyanosis, Clubbing or edema, No new Rash or bruise     Data Review  CBC  Lab 11/13/11 0535 11/12/11 0530  WBC 8.5 8.8  HGB 11.0* 13.1  HCT 33.5* 39.5  PLT 224 268  MCV 90.8 89.6  MCH 29.8 29.7  MCHC 32.8 33.2  RDW 13.9 13.4  LYMPHSABS -- 1.5  MONOABS -- 0.2  EOSABS -- 0.0  BASOSABS -- 0.0  BANDABS -- --    Chemistries   Lab 11/13/11 0535 11/12/11 0530  NA 140 141  K 3.5 3.5  CL 107 103  CO2 26 24  GLUCOSE 107* 148*  BUN 8 11  CREATININE 0.72 0.67  CALCIUM 8.8 10.1  MG -- --  AST -- 1616  ALT -- 77  ALKPHOS -- 112110  BILITOT -- 0.30.3    ------------------------------------------------------------------------------------------------------------------ estimated creatinine clearance is 69 ml/min (by C-G formula based on Cr of 0.72). ------------------------------------------------------------------------------------------------------------------  Basename 11/12/11 0530  HGBA1C 5.5   ------------------------------------------------------------------------------------------------------------------ No results found for this basename: CHOL:2,HDL:2,LDLCALC:2,TRIG:2,CHOLHDL:2,LDLDIRECT:2 in the last 72 hours ------------------------------------------------------------------------------------------------------------------  [facility] 11/13/11 1403  TSH 1.790  T4TOTAL --  T3FREE --  THYROIDAB --   ------------------------------------------------------------------------------------------------------------------ No results found for this basename: VITAMINB12:2,FOLATE:2,FERRITIN:2,TIBC:2,IRON:2,RETICCTPCT:2 in the last 72 hours  Coagulation profile No results found for this basename: INR:5,PROTIME:5 in the last 168 hours  No results found for this basename: DDIMER:2 in the last 72 hours  Cardiac Enzymes No results found for this basename: CK:3,CKMB:3,TROPONINI:3,MYOGLOBIN:3 in the last 168 hours ------------------------------------------------------------------------------------------------------------------ No components found with this basename: POCBNP:3  Micro Results Recent Results (from the past 240 hour(s))  URINE CULTURE     Status: Normal   Collection Time   11/12/11  6:09 AM      Component Value Range Status Comment   Specimen Description URINE, RANDOM   Final    Special Requests NONE   Final    Culture  Setup Time [id number]   Final    Colony Count 55,000 COLONIES/ML  Final    Culture     Final    Value: Multiple bacterial morphotypes present, none predominant. Suggest appropriate recollection if clinically  indicated.   Report Status 11/13/2011 FINAL   Final     Radiology Reports Dg Abd Acute W/chest  11/12/2011  *RADIOLOGY REPORT*  Clinical Data: Nausea, vomiting.  ACUTE ABDOMEN SERIES (ABDOMEN 2 VIEW & CHEST 1 VIEW)  Comparison: 01/30/2010 CT  Findings: Lungs are predominately clear.  Cardiomediastinal contours within normal limits.  Relative paucity of small bowel gas.  There is air present within the rectum.  Calcific densities projecting over the lateral pelvis, in keeping with injection granuloma.  No acute osseous abnormality. Right greater than left hip DJD.  No free intraperitoneal air visualized on the decubitus view. Organ outlines normal where seen.  IMPRESSION: Nonobstructive bowel gas pattern.  Original Report Authenticated By: Waneta Martins, M.D.    Scheduled Meds:    . cefTRIAXone (ROCEPHIN)  IV  1 g Intravenous Q24H  . heparin  5,000 Units Subcutaneous Q8H  . LORazepam  1 mg Intravenous Once  . ondansetron (ZOFRAN) IV  4 mg Intravenous Q8H  . tiotropium  18 mcg Inhalation Daily   Continuous Infusions:    . DISCONTD: 0.9 % NaCl with KCl 20 mEq / L 75 mL/hr at 11/13/11 0356   PRN Meds:.albuterol, gadobenate dimeglumine, guaiFENesin-dextromethorphan, HYDROcodone-acetaminophen, ondansetron, DISCONTD: ondansetron (ZOFRAN) IV, DISCONTD: promethazine  Assessment & Plan    1. Recurrent nausea and vomiting for the last several years - patient has history of reflux and hiatal hernia,  stable lipase and lytes, her KUB is unremarkable, PRN IV Zofran when necessary, MRI brain stable D/W Radiologist Dr Alfredo Batty, get RUQ [place] today per  GI Dr Dulce Sellar , he will see her again today.    2. Possible mild UTI - follow urine cultures IV Rocephin for total of 3 days.     3. History of COPD and psoriasis no acute issues outpatient monitor patient counseled to small stop smoking when necessary nebulizer treatments.    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   DVT Prophylaxis    Heparin       Leroy Sea M.D on 11/14/2011 at 10:21 AM  [facility] Office  [phone]

## 2011-11-14 NOTE — Progress Notes (Signed)
Subjective: Nausea, vomiting, pain improving.  Objective: Vital signs in last 24 hours: Temp:  [98.4 F (36.9 C)-98.6 F (37 C)] 98.5 F (36.9 C) (03/15 0602) Pulse Rate:  [70-102] 70  (03/15 0602) Resp:  [18] 18  (03/15 0602) BP: (137-149)/(79-85) 137/79 mmHg (03/15 0602) SpO2:  [98 %-100 %] 98 % (03/15 0822) Weight:  [66.3 kg (146 lb 2.6 oz)] 66.3 kg (146 lb 2.6 oz) (03/15 0602) Weight change: 0.2 kg (7.1 oz) Last BM Date: 11/09/11  PE: GEN:  Flat affect, NAD ABD:  Soft  MRI:  Old ischemic changes, small lipoma; no findings to explain N/V  Ultrasound:  Gallstones and sludge.  No cholecystitis; no biliary dilatation.  CMP     Component Value Date/Time   NA 140 11/13/2011 0535   K 3.5 11/13/2011 0535   CL 107 11/13/2011 0535   CO2 26 11/13/2011 0535   GLUCOSE 107* 11/13/2011 0535   BUN 8 11/13/2011 0535   CREATININE 0.72 11/13/2011 0535   CALCIUM 8.8 11/13/2011 0535   PROT 7.8 11/12/2011 0530   PROT 7.8 11/12/2011 0530   ALBUMIN 4.4 11/12/2011 0530   ALBUMIN 4.3 11/12/2011 0530   AST 16 11/12/2011 0530   AST 16 11/12/2011 0530   ALT 7 11/12/2011 0530   ALT 7 11/12/2011 0530   ALKPHOS 112 11/12/2011 0530   ALKPHOS 110 11/12/2011 0530   BILITOT 0.3 11/12/2011 0530   BILITOT 0.3 11/12/2011 0530   GFRNONAA >90 11/13/2011 0535   GFRAA >90 11/13/2011 0535   Assessment:  1.  Nausea, vomiting, pain. 2.  Family history of colon cancer.  Plan:  1.  I don't think patient's symptoms are gallbladder-related, but her presence of gallstones can't be completely forgotten.  Leading consideration would be regurgitation from reflux disease, rumination, functional process.  Extensive prior evaluation is unrevealing. 2.  I would suggest continuing supportive antiemetics, PPI, and advance diet. 3.  If unable to advance diet due to persistent nausea, vomiting, could consider surgical consult, but, again, I am not convinced patient's symptoms are gallbladder-related. 4.  In light of patient's age and  family history of colon cancer, she needs a colonoscopy, but this can be arranged as an outpatient. 5.  Will follow.   Freddy Jaksch 11/14/2011, 1:10 PM

## 2011-11-15 MED ORDER — PANTOPRAZOLE SODIUM 40 MG PO TBEC: 40.0000 mg | DELAYED_RELEASE_TABLET | Freq: Every day | ORAL | Status: DC

## 2011-11-15 MED ORDER — ONDANSETRON HCL 4 MG PO TABS: 4.0000 mg | ORAL_TABLET | Freq: Four times a day (QID) | ORAL | Status: AC | PRN

## 2011-11-15 NOTE — Discharge Summary (Signed)
[name], 61 y.o., DOB [birth year], MRN [id number]. Admission date: 11/12/2011 Discharge Date 11/15/2011 Primary MD No primary provider on file. Admitting Physician Leroy Sea, MD  Admission Diagnosis  COPD (chronic obstructive pulmonary disease) [496] Nausea & vomiting [787.01] Smoker [305.1] ABDOMINAL PAIN  Discharge Diagnosis   Principal Problem:  *Nausea & vomiting Active Problems:  COPD  GASTROESOPHAGEAL REFLUX DISEASE  HIATAL HERNIA WITH REFLUX  PSORIASIS  Smoker  UTI (lower urinary tract infection)    Past Medical History  Diagnosis Date  . Nausea & vomiting   . COPD (chronic obstructive pulmonary disease)   . Smoker   . PSORIASIS 12/29/2006    Qualifier: Diagnosis of  By: Reche Dixon MD, Onalee Hua    . Hx of colonic polyps   . Asthma     Past Surgical History  Procedure Date  . Partial hysterectomy   . Tonsillectomy   . Appendectomy   . Colonoscopy w/ polypectomy 02/26/2005  . Upper gastrointestinal endoscopy 03/22/2010, 02/13/2005  . Hernia repair     x 2     Hospital Course See H&P, Labs, Consult and Test reports for all details in brief, patient was admitted for   1. Recurrent nausea and vomiting for the last several years - patient has history of reflux and hiatal hernia, stable lipase and lytes, her KUB was unremarkable, stable Brain MRI and RUQ [place], she is symptom free now, tolerating diet, seen by GI Dr Dulce Sellar, for now will DC on PPI, she does have Gall stones and if symptoms come back Surgical eval should be considered. She will follow with GI in 1 week.   2. Possible mild UTI - treated.  3. History of COPD and psoriasis no acute issues outpatient monitor patient counseled to small stop smoking when necessary nebulizer treatments.    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      Consults  GI - Outlaw  Significant Tests:  See full reports for all details     [name]  Contrast  11/13/2011  *RADIOLOGY REPORT*  Clinical Data: Headaches.  Chronic nausea and vomiting.  MRI HEAD WITHOUT AND WITH CONTRAST  Technique:  Multiplanar, multiecho pulse sequences of the brain and surrounding structures were obtained according to standard protocol without and with intravenous contrast  Contrast: 13mL MULTIHANCE GADOBENATE DIMEGLUMINE 529 MG/ML IV SOLN  Comparison: None.  Findings: No acute infarct, hemorrhage, mass lesion is present.  A small lipoma posterior to the mid brain on the left measures 5 mm.  Patient is status post multiple right cerebellar infarct, predominately within the PICA distribution.  A tiny lacunar infarct is present more superiorly in the left cerebellum.  Moderate periventricular and subcortical white matter changes are markedly advanced for age.  The postcontrast images demonstrate to no pathologic enhancement.  Flow is present in the major intracranial arteries.  The globes and orbits are intact.  A polyp or mucous retention cyst is present along the floor of the right maxillary sinus.  The paranasal sinuses are otherwise clear.  There is some fluid in the mastoid air cells bilaterally.  No obstructing nasopharyngeal lesion is evident.  IMPRESSION:  1.  Remote right PICA territory infarcts. 2.  Age advanced white matter disease.  This likely reflects the sequelae of chronic microvascular ischemia. 3.  No acute intracranial abnormality. 4.  A small amount of fluid is present in the mastoid air cells bilaterally.  No obstructing nasopharyngeal lesion is evident. 5.  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  Original Report Authenticated By: Jamesetta Orleans. MATTERN, M.D.   Dg Abd Acute W/chest  11/12/2011  *RADIOLOGY REPORT*  Clinical Data: Nausea, vomiting.  ACUTE ABDOMEN SERIES (ABDOMEN 2 VIEW & CHEST 1 VIEW)  Comparison: 01/30/2010 CT  Findings: Lungs are predominately clear.  Cardiomediastinal contours within normal limits.  Relative paucity of small  bowel gas.  There is air present within the rectum.  Calcific densities projecting over the lateral pelvis, in keeping with injection granuloma.  No acute osseous abnormality. Right greater than left hip DJD.  No free intraperitoneal air visualized on the decubitus view. Organ outlines normal where seen.  IMPRESSION: Nonobstructive bowel gas pattern.  Original Report Authenticated By: Waneta Martins, M.D.   US Abdomen Limited Ruq  11/14/2011  *RADIOLOGY REPORT*  Clinical Data:  Nausea and vomiting.  LIMITED ABDOMINAL ULTRASOUND - RIGHT UPPER QUADRANT  Comparison:  CT scan from 01/30/2010.  Findings:  Gallbladder:  Multiple layering tiny stones are seen in the lumen of the gallbladder.  There is associated gallbladder sludge. Gallbladder wall thickness measures two - 3 mm, within normal limits.  No pericholecystic fluid.  The sonographer reports no sonographic Murphy's sign.  Common bile duct:  Nondilated at 7 mm diameter.  Liver:  No intrahepatic biliary duct dilatation.  No focal intraparenchymal abnormality.  Note:  Additional incidentally imaged anatomy is unremarkable.  IMPRESSION: Cholelithiasis without evidence for cholecystitis.                   Original Report Authenticated By: ERIC A. MANSELL, M.D.     Today   Subjective:   [name] today has no headache,no chest abdominal pain,no new weakness tingling or numbness, feels much better wants to go home today.    Objective:   Blood pressure 130/68, pulse 88, temperature 97.7 F (36.5 C), temperature source Oral, resp. rate 18, height 5\' 5"  (1.651 m), weight 65.6 kg (144 lb 10 oz), SpO2 98.00%.  Intake/Output Summary (Last 24 hours) at 11/15/11 0912 Last data filed at 11/14/11 1900  Gross per 24 hour  Intake    600 ml  Output      0 ml  Net    600 ml    Exam Awake Alert, Oriented *3, No new F.N deficits, Normal affect [place].AT,PERRAL Supple Neck,No JVD, No cervical lymphadenopathy appriciated.  Symmetrical Chest wall  movement, Good air movement bilaterally, CTAB RRR,No Gallops,Rubs or new Murmurs, No Parasternal Heave +ve B.Sounds, Abd Soft, Non tender, No organomegaly appriciated, No rebound -guarding or rigidity. No Cyanosis, Clubbing or edema, No new Rash or bruise  Data Review      CBC w Diff: Lab Results  Component Value Date   WBC 8.5 11/13/2011   HGB 11.0* 11/13/2011   HCT 33.5* 11/13/2011   PLT 224 11/13/2011   LYMPHOPCT 17 11/12/2011   MONOPCT 3 11/12/2011   EOSPCT 0 11/12/2011   BASOPCT 0 11/12/2011   CMP: Lab Results  Component Value Date   NA 140 11/13/2011   K 3.5 11/13/2011   CL 107 11/13/2011   CO2 26 11/13/2011   BUN 8 11/13/2011   CREATININE 0.72 11/13/2011   PROT 7.8 11/12/2011   PROT 7.8 11/12/2011   ALBUMIN 4.4 11/12/2011   ALBUMIN 4.3 11/12/2011   BILITOT 0.3 11/12/2011   BILITOT 0.3 11/12/2011   ALKPHOS 112 11/12/2011   ALKPHOS 110 11/12/2011   AST  16 11/12/2011   AST 16 11/12/2011   ALT 7 11/12/2011   ALT 7 11/12/2011  .  Micro Results Recent Results (from the past 240 hour(s))  URINE CULTURE     Status: Normal   Collection Time   11/12/11  6:09 AM      Component Value Range Status Comment   Specimen Description URINE, RANDOM   Final    Special Requests NONE   Final    Culture  Setup Time [id number]   Final    Colony Count 55,000 COLONIES/ML   Final    Culture     Final    Value: Multiple bacterial morphotypes present, none predominant. Suggest appropriate recollection if clinically indicated.   Report Status 11/13/2011 FINAL   Final      Discharge Instructions     Follow with Primary MD  in 3 days   Get CBC, CMP, checked 3 days by Primary MD and again as instructed by your Primary MD.   Get Medicines reviewed and adjusted.  Please request your Prim.MD to go over all Hospital Tests and Procedure/Radiological results at the follow up, please get all Hospital records sent to your Prim MD by signing hospital release before you go home.  Activity: Fall precautions use  walker/cane & assistance as needed  Diet: Heart Healthy, Aspiration precautions.  For Heart failure patients - Check your Weight same time everyday, if you gain over 2 pounds, or you develop in leg swelling, experience more shortness of breath or chest pain, call your Primary MD immediately. Follow Cardiac Low Salt Diet and 1.8 lit/day fluid restriction.  Disposition Home  If you experience worsening of your admission symptoms, develop shortness of breath, life threatening emergency, suicidal or homicidal thoughts you must seek medical attention immediately by calling 911 or calling your MD immediately  if symptoms less severe.  You Must read complete instructions/literature along with all the possible adverse reactions/side effects for all the Medicines you take and that have been prescribed to you. Take any new Medicines after you have completely understood and accpet all the possible adverse reactions/side effects.   Do not drive if your were admitted for syncope or siezures until you have seen by Primary MD or a Neurologist and advised to drive.  Do not drive when taking Pain medications.    Do not take more than prescribed Pain, Sleep and Anxiety Medications  Special Instructions: If you have smoked or chewed Tobacco  in the last 2 yrs please stop smoking, stop any regular Alcohol  and or any Recreational drug use.  Wear Seat belts while driving. Follow-up Information    Follow up with Margretta Ditty, MD. Schedule an appointment as soon as possible for a visit in 3 days.   Contact information:   [facility] [address] [place] [address] [phone]       Follow up with Freddy Jaksch, MD in 1 week.   Contact information:   [address] [place] [address] [phone]       Follow up with [facility], MD. Schedule an appointment as soon as possible for a visit in 3 weeks.   Contact information:   [address] [place] [address] [phone]          Discharge Medications   Medication List  As of 11/15/2011  9:12 AM   START taking these medications         ondansetron  4 MG tablet   Commonly known as: ZOFRAN   Take 1 tablet (4 mg total) by mouth every 6 (six) hours as needed for nausea.      pantoprazole 40 MG tablet   Commonly known as: PROTONIX   Take 1 tablet (40 mg total) by mouth daily.         CONTINUE taking these medications         albuterol 108 (90 BASE) MCG/ACT inhaler   Commonly known as: PROVENTIL HFA;VENTOLIN HFA      promethazine 25 MG tablet   Commonly known as: PHENERGAN      tiotropium 18 MCG inhalation capsule   Commonly known as: SPIRIVA          Where to get your medications    These are the prescriptions that you need to pick up.   You may get these medications from any pharmacy.         ondansetron 4 MG tablet   pantoprazole 40 MG tablet             Total Time in preparing paper work, data evaluation and todays exam - 35 minutes  Leroy Sea M.D on 11/15/2011 at 9:12 AM  [facility] Office  [phone]

## 2011-11-15 NOTE — Discharge Instructions (Signed)
Follow with Primary MD  in 3 days   Get CBC, CMP, checked 3 days by Primary MD and again as instructed by your Primary MD.   Get Medicines reviewed and adjusted.  Please request your Prim.MD to go over all Hospital Tests and Procedure/Radiological results at the follow up, please get all Hospital records sent to your Prim MD by signing hospital release before you go home.  Activity: Fall precautions use walker/cane & assistance as needed  Diet: Heart Healthy, Aspiration precautions.  For Heart failure patients - Check your Weight same time everyday, if you gain over 2 pounds, or you develop in leg swelling, experience more shortness of breath or chest pain, call your Primary MD immediately. Follow Cardiac Low Salt Diet and 1.8 lit/day fluid restriction.  Disposition Home  If you experience worsening of your admission symptoms, develop shortness of breath, life threatening emergency, suicidal or homicidal thoughts you must seek medical attention immediately by calling 911 or calling your MD immediately  if symptoms less severe.  You Must read complete instructions/literature along with all the possible adverse reactions/side effects for all the Medicines you take and that have been prescribed to you. Take any new Medicines after you have completely understood and accpet all the possible adverse reactions/side effects.   Do not drive if your were admitted for syncope or siezures until you have seen by Primary MD or a Neurologist and advised to drive.  Do not drive when taking Pain medications.    Do not take more than prescribed Pain, Sleep and Anxiety Medications  Special Instructions: If you have smoked or chewed Tobacco  in the last 2 yrs please stop smoking, stop any regular Alcohol  and or any Recreational drug use.  Wear Seat belts while driving.

## 2011-11-15 NOTE — Progress Notes (Signed)
Gave pt discharge instructions and prescriptions and explained them to her. Pt verbalized understanding of instructions given. Pt left via wheelchair with tech in no acute distress. 

## 2011-11-18 MED ORDER — GADOBENATE DIMEGLUMINE 529 MG/ML IV SOLN
15.0000 mL | Freq: Once | INTRAVENOUS | Status: AC | PRN
  Administered 2011-11-18: 13 mL via INTRAVENOUS

## 2012-02-09 ENCOUNTER — Emergency Department (HOSPITAL_COMMUNITY): Payer: Medicare Other

## 2012-02-09 ENCOUNTER — Encounter (HOSPITAL_COMMUNITY): Payer: Self-pay

## 2012-02-09 ENCOUNTER — Emergency Department (HOSPITAL_COMMUNITY)
Admission: EM | Admit: 2012-02-09 | Discharge: 2012-02-09 | Disposition: A | Discharge status: Home / Self Care | Payer: Medicare Other | Attending: Emergency Medicine | Admitting: Emergency Medicine

## 2012-02-09 DIAGNOSIS — R064 Hyperventilation: Secondary | ICD-10-CM | POA: Diagnosis not present

## 2012-02-09 DIAGNOSIS — F17200 Nicotine dependence, unspecified, uncomplicated: Secondary | ICD-10-CM | POA: Diagnosis not present

## 2012-02-09 DIAGNOSIS — R1013 Epigastric pain: Secondary | ICD-10-CM | POA: Insufficient documentation

## 2012-02-09 DIAGNOSIS — R079 Chest pain, unspecified: Secondary | ICD-10-CM | POA: Insufficient documentation

## 2012-02-09 DIAGNOSIS — K8020 Calculus of gallbladder without cholecystitis without obstruction: Secondary | ICD-10-CM | POA: Insufficient documentation

## 2012-02-09 DIAGNOSIS — J449 Chronic obstructive pulmonary disease, unspecified: Secondary | ICD-10-CM | POA: Insufficient documentation

## 2012-02-09 DIAGNOSIS — R109 Unspecified abdominal pain: Secondary | ICD-10-CM | POA: Diagnosis not present

## 2012-02-09 DIAGNOSIS — R0789 Other chest pain: Secondary | ICD-10-CM | POA: Diagnosis not present

## 2012-02-09 DIAGNOSIS — R112 Nausea with vomiting, unspecified: Secondary | ICD-10-CM | POA: Diagnosis not present

## 2012-02-09 DIAGNOSIS — J4489 Other specified chronic obstructive pulmonary disease: Secondary | ICD-10-CM | POA: Insufficient documentation

## 2012-02-09 LAB — DIFFERENTIAL
Eosinophils Relative: 0 % (ref 0–5)
Lymphocytes Relative: 22 % (ref 12–46)
Lymphs Abs: 1.9 10*3/uL (ref 0.7–4.0)
Monocytes Relative: 2 % — ABNORMAL LOW (ref 3–12)
Neutrophils Relative %: 76 % (ref 43–77)

## 2012-02-09 LAB — CBC
Hemoglobin: 13.8 g/dL (ref 12.0–15.0)
MCH: 30.3 pg (ref 26.0–34.0)
MCV: 88.6 fL (ref 78.0–100.0)
Platelets: 291 10*3/uL (ref 150–400)
RBC: 4.56 MIL/uL (ref 3.87–5.11)
WBC: 8.8 10*3/uL (ref 4.0–10.5)

## 2012-02-09 LAB — COMPREHENSIVE METABOLIC PANEL
ALT: 7 U/L (ref 0–35)
Alkaline Phosphatase: 90 U/L (ref 39–117)
BUN: 6 mg/dL (ref 6–23)
CO2: 22 mEq/L (ref 19–32)
GFR calc Af Amer: >90 mL/min (ref >90)
GFR calc non Af Amer: >90 mL/min (ref >90)
Glucose, Bld: 129 mg/dL — ABNORMAL HIGH (ref 70–99)
Potassium: 5.4 mEq/L — ABNORMAL HIGH (ref 3.5–5.1)
Sodium: 138 mEq/L (ref 135–145)

## 2012-02-09 LAB — LIPASE, BLOOD: Lipase: 28 U/L (ref 11–59)

## 2012-02-09 MED ORDER — PROMETHAZINE HCL 25 MG RE SUPP: 25.0000 mg | Freq: Four times a day (QID) | RECTAL | Status: DC | PRN

## 2012-02-09 MED ORDER — ONDANSETRON HCL 4 MG/2ML IJ SOLN
4.0000 mg | Freq: Once | INTRAMUSCULAR | Status: AC
  Administered 2012-02-09: 4 mg via INTRAVENOUS
  Filled 2012-02-09: qty 2

## 2012-02-09 MED ORDER — MORPHINE SULFATE 4 MG/ML IJ SOLN
4.0000 mg | Freq: Once | INTRAMUSCULAR | Status: AC
  Administered 2012-02-09: 4 mg via INTRAVENOUS
  Filled 2012-02-09: qty 1

## 2012-02-09 MED ORDER — ALBUTEROL SULFATE HFA 108 (90 BASE) MCG/ACT IN AERS: 1.0000 | INHALATION_SPRAY | Freq: Four times a day (QID) | RESPIRATORY_TRACT | Status: DC | PRN

## 2012-02-09 MED ORDER — IOHEXOL 300 MG/ML  SOLN
20.0000 mL | INTRAMUSCULAR | Status: AC
  Administered 2012-02-09: 20 mL via ORAL

## 2012-02-09 MED ORDER — HYDROCODONE-ACETAMINOPHEN 5-325 MG PO TABS: 2.0000 | ORAL_TABLET | ORAL | Status: AC | PRN

## 2012-02-09 MED ORDER — SODIUM CHLORIDE 0.9 % IV BOLUS (SEPSIS)
1000.0000 mL | INTRAVENOUS | Status: AC
  Administered 2012-02-09: 1000 mL via INTRAVENOUS

## 2012-02-09 MED ORDER — IOHEXOL 300 MG/ML  SOLN
100.0000 mL | Freq: Once | INTRAMUSCULAR | Status: AC | PRN
  Administered 2012-02-09: 100 mL via INTRAVENOUS

## 2012-02-09 NOTE — ED Notes (Signed)
Pt. Reports that she developed Chest pain /abdominal pain 7 days ago. Has been constant. With nausea and vomiting.  Pt. Reports that she was just in our hospital recently for the same symptoms and was diagnosed with gallstones.  She reports that this pain feels like that pain.

## 2012-02-09 NOTE — ED Notes (Signed)
Blood drawn with IV start

## 2012-02-09 NOTE — ED Provider Notes (Signed)
4:58 PM- Patient has been re-evaluated. Abdominal pain has continued to stay resolved, abdomen NTP She states that she does not have an albuterol inhaler, nausea medication or pain medication and would like a refill. Patient is being referred to Dr. Dulce Sellar for GI follow-up. I would like patient ot be seen within the next two weeks.  Pt has been advised of the symptoms that warrant their return to the ED. Patient has voiced understanding and has agreed to follow-up with the PCP or specialist.   Dorthula Matas, PA 02/09/12 1700

## 2012-02-09 NOTE — ED Notes (Signed)
Pt sts she was brought in by her sister.

## 2012-02-09 NOTE — ED Provider Notes (Signed)
History     CSN: [id number]  Arrival date & time 02/09/12  1154   First MD Initiated Contact with Patient 02/09/12 1235      Chief Complaint  Patient presents with  . Chest Pain    (Consider location/radiation/quality/duration/timing/severity/associated sxs/prior treatment) HPI History from patient. 61 year old female who presents with pain to the epigastrium which seems to radiate into her chest. She states this started about a week ago and has been persistent since beginning. Pain is described as sharp in nature and seems to originate in epigastrium/right upper quadrant. There has been associated nausea and vomiting. No known alleviating factors; pt has attempted to take ibuprofen but has not been able to keep this down. Pain worsens with eating and palpation. Does not change with movement or position. Pt denies changes in bowel movements, shortness of breath, diaphoresis, palpitations, leg swelling. She reports that she was hospitalized in 10/2011 and had an ultrasound performed at that time which showed she had gallstones; states that this pain feels similar to that episode.  Review of previous chart indicates she was hospitalized in March for nausea/vomiting and seen by GI during that visit. That consult note indicates that she has had 2-3 episodes of nausea/vomiting daily for years accompanied by lower abd pain. Has been evaluated with abdominal ultrasound, HIDA scan, gastric emptying study, CT scan abdomen+ pelvis, and endoscopy, all within the past 2-3 years, all essentially normal. GI MD did not feel that this was likely a problem stemming from cholelithiasis, but if it were to recur, it could be a consideration as an etiology.  Past Medical History  Diagnosis Date  . Nausea & vomiting   . COPD (chronic obstructive pulmonary disease)   . Smoker   . PSORIASIS 12/29/2006    Qualifier: Diagnosis of  By: Reche Dixon MD, Onalee Hua    . Hx of colonic polyps   . Asthma     Past Surgical History   Procedure Date  . Partial hysterectomy   . Tonsillectomy   . Appendectomy   . Colonoscopy w/ polypectomy 02/26/2005  . Upper gastrointestinal endoscopy 03/22/2010, 02/13/2005  . Hernia repair     x 2    Family History  Problem Relation Age of Onset  . Cancer Other   . Coronary artery disease Other     History  Substance Use Topics  . Smoking status: Current Some Day Smoker -- 0.2 packs/day  . Smokeless tobacco: Never Used  . Alcohol Use: No     Quit alcohol in 1992    OB History    Grav Para Term Preterm Abortions TAB SAB Ect Mult Living                  Review of Systems  Constitutional: Positive for appetite change. Negative for fever, chills and activity change.  Eyes: Negative for discharge.  Respiratory: Negative for cough, chest tightness and shortness of breath.   Cardiovascular: Positive for chest pain. Negative for palpitations.  Gastrointestinal: Positive for nausea, vomiting and abdominal pain. Negative for diarrhea, constipation, blood in stool, abdominal distention and anal bleeding.  Genitourinary: Negative for dysuria and flank pain.  Skin: Negative for color change and rash.  Neurological: Negative for dizziness and weakness.  All other systems reviewed and are negative.    Allergies  Review of patient's allergies indicates no known allergies.  Home Medications   Current Outpatient Rx  Name Route Sig Dispense Refill  . ALBUTEROL SULFATE HFA 108 (90 BASE) MCG/ACT IN AERS Inhalation  Inhale 2 puffs into the lungs 3 (three) times daily.     Marland Kitchen ONDANSETRON HCL 4 MG PO TABS Oral Take 4 mg by mouth every 8 (eight) hours as needed. For nausea    . PANTOPRAZOLE SODIUM 40 MG PO TBEC Oral Take 1 tablet (40 mg total) by mouth daily. 30 tablet 0  . PROMETHAZINE HCL 25 MG PO TABS Oral Take 25 mg by mouth every 6 (six) hours as needed. nausea    . TIOTROPIUM BROMIDE MONOHYDRATE 18 MCG IN CAPS Inhalation Place 18 mcg into inhaler and inhale daily.      BP 151/76   Pulse 81  Temp(Src) 98.9 F (37.2 C) (Oral)  Resp 18  SpO2 100%  Physical Exam  Nursing note and vitals reviewed. Constitutional: She is oriented to person, place, and time. She appears well-developed and well-nourished. No distress.       Pt initially uncomfortable appearing, hyperventilating  HENT:  Head: Normocephalic and atraumatic.  Right Ear: External ear normal.  Left Ear: External ear normal.  Mouth/Throat: Oropharynx is clear and moist. No oropharyngeal exudate.  Eyes: EOM are normal. Pupils are equal, round, and reactive to light.  Neck: Normal range of motion.  Cardiovascular: Normal rate, regular rhythm and normal heart sounds.  Exam reveals no gallop and no friction rub.   No murmur heard. Pulmonary/Chest: Effort normal and breath sounds normal. She exhibits no tenderness.  Abdominal: Soft. Bowel sounds are normal. She exhibits no distension and no mass. There is tenderness in the right upper quadrant and epigastric area. There is guarding. There is no rebound.  Musculoskeletal: Normal range of motion.  Neurological: She is alert and oriented to person, place, and time.  Skin: Skin is warm and dry. She is not diaphoretic.  Psychiatric: She has a normal mood and affect.    ED Course  Procedures (including critical care time)   Date: 02/09/2012  Rate: 90  Rhythm: normal sinus rhythm  QRS Axis: normal  Intervals: normal  ST/T Wave abnormalities: nonspecific ST changes  Conduction Disutrbances:none  Narrative Interpretation:   Old EKG Reviewed: as compared with May 16 2011 rate decreased  Labs Reviewed  DIFFERENTIAL - Abnormal; Notable for the following:    Monocytes Relative 2 (*)    All other components within normal limits  COMPREHENSIVE METABOLIC PANEL - Abnormal; Notable for the following:    Potassium 5.4 (*) HEMOLYSIS AT THIS LEVEL MAY AFFECT RESULT   Glucose, Bld 129 (*)    All other components within normal limits  CBC  LIPASE, BLOOD  POCT I-STAT  TROPONIN I  POTASSIUM   Dg Chest 2 View  02/09/2012  *RADIOLOGY REPORT*  Clinical Data: Chest/epigastric pain  CHEST - 2 VIEW  Comparison: 05/16/2011  Findings: Lungs are clear. No pleural effusion or pneumothorax.  Cardiomediastinal silhouette is within normal limits.  Mild degenerative changes of the visualized thoracolumbar spine.  IMPRESSION: No evidence of acute cardiopulmonary disease.  Original Report Authenticated By: Charline Bills, M.D.   US Abdomen Complete  02/09/2012  *RADIOLOGY REPORT*  Clinical Data:  Abdominal pain.  COMPLETE ABDOMINAL ULTRASOUND  Comparison:  Right upper quadrant ultrasound 11/14/2011.  Findings:  Gallbladder:  No gallstones, gallbladder wall thickening, or pericholecystic fluid.  Common bile duct:  3 mm, within normal limits.  Liver:  No focal lesion identified.  Within normal limits in parenchymal echogenicity.  IVC:  Appears normal.  Pancreas:  No focal abnormality seen.  Spleen:  5.7 cm, negative.  Right Kidney:  11.3  cm.  Normal parenchymal echogenicity.  No hydronephrosis.  Left Kidney:  10.3 cm.  Normal parenchymal echogenicity.  No hydronephrosis.  Abdominal aorta:  No aneurysm identified.  IMPRESSION: Negative abdominal ultrasound.  Original Report Authenticated By: Reyes Ivan, M.D.   *RADIOLOGY REPORT* from 11/14/11 Clinical Data: Nausea and vomiting.  LIMITED ABDOMINAL ULTRASOUND - RIGHT UPPER QUADRANT  Comparison: CT scan from 01/30/2010.  Findings:  Gallbladder: Multiple layering tiny stones are seen in the lumen  of the gallbladder. There is associated gallbladder sludge.  Gallbladder wall thickness measures two - 3 mm, within normal  limits. No pericholecystic fluid. The sonographer reports no  sonographic Murphy's sign.  Common bile duct: Nondilated at 7 mm diameter.  Liver: No intrahepatic biliary duct dilatation. No focal  intraparenchymal abnormality.  Note: Additional incidentally imaged anatomy is unremarkable.  IMPRESSION:    Cholelithiasis without evidence for cholecystitis.    No diagnosis found.    MDM  Pt with hx of gallstones presents with pain to epigastrium/RUQ which radiates up into the chest. On exam, tender to palp to epigastrium/RUQ without tenderness over sternum or into chest. Pt's labs generally unremarkable. Ultrasound today is actually normal appearing in contrast with 11/14/11 which showed multiple tiny stones and sludging. Pt medicated which she reports has improved pain but still has significant amount of tenderness on exam. CT abd/pel ordered for further characterization.   Pt signed out at 1615 to Neva Seat, PA-C/Dr. Preston Fleeting, oncoming team, who will follow CT findings.      Grant Fontana, PA-C 02/09/12 1614

## 2012-02-10 NOTE — ED Provider Notes (Signed)
Medical screening examination/treatment/procedure(s) were performed by non-physician practitioner and as supervising physician I was immediately available for consultation/collaboration.   Jannet Calip, MD 02/10/12 0139 

## 2012-02-11 NOTE — ED Provider Notes (Signed)
Medical screening examination/treatment/procedure(s) were performed by non-physician practitioner and as supervising physician I was immediately available for consultation/collaboration.  Sharde Gover K Linker, MD 02/11/12 0912 

## 2012-02-27 DIAGNOSIS — K5900 Constipation, unspecified: Secondary | ICD-10-CM | POA: Diagnosis not present

## 2012-02-27 DIAGNOSIS — R112 Nausea with vomiting, unspecified: Secondary | ICD-10-CM | POA: Diagnosis not present

## 2012-02-27 DIAGNOSIS — R1011 Right upper quadrant pain: Secondary | ICD-10-CM | POA: Diagnosis not present

## 2012-05-12 DIAGNOSIS — R112 Nausea with vomiting, unspecified: Secondary | ICD-10-CM | POA: Diagnosis not present

## 2012-05-12 DIAGNOSIS — R1011 Right upper quadrant pain: Secondary | ICD-10-CM | POA: Diagnosis not present

## 2012-06-09 DIAGNOSIS — Z800 Family history of malignant neoplasm of digestive organs: Secondary | ICD-10-CM | POA: Diagnosis not present

## 2012-06-09 DIAGNOSIS — R1011 Right upper quadrant pain: Secondary | ICD-10-CM | POA: Diagnosis not present

## 2012-06-09 DIAGNOSIS — R112 Nausea with vomiting, unspecified: Secondary | ICD-10-CM | POA: Diagnosis not present

## 2012-07-09 ENCOUNTER — Emergency Department (HOSPITAL_COMMUNITY)
Admission: EM | Admit: 2012-07-09 | Discharge: 2012-07-10 | Disposition: A | Discharge status: Home / Self Care | Payer: Medicare Other | Attending: Emergency Medicine | Admitting: Emergency Medicine

## 2012-07-09 ENCOUNTER — Emergency Department (HOSPITAL_COMMUNITY): Payer: Medicare Other

## 2012-07-09 ENCOUNTER — Encounter (HOSPITAL_COMMUNITY): Payer: Self-pay

## 2012-07-09 DIAGNOSIS — J45909 Unspecified asthma, uncomplicated: Secondary | ICD-10-CM | POA: Diagnosis not present

## 2012-07-09 DIAGNOSIS — K5900 Constipation, unspecified: Secondary | ICD-10-CM | POA: Diagnosis not present

## 2012-07-09 DIAGNOSIS — Z872 Personal history of diseases of the skin and subcutaneous tissue: Secondary | ICD-10-CM | POA: Diagnosis not present

## 2012-07-09 DIAGNOSIS — F17200 Nicotine dependence, unspecified, uncomplicated: Secondary | ICD-10-CM | POA: Diagnosis not present

## 2012-07-09 DIAGNOSIS — R079 Chest pain, unspecified: Secondary | ICD-10-CM | POA: Insufficient documentation

## 2012-07-09 DIAGNOSIS — R109 Unspecified abdominal pain: Secondary | ICD-10-CM | POA: Diagnosis not present

## 2012-07-09 DIAGNOSIS — J449 Chronic obstructive pulmonary disease, unspecified: Secondary | ICD-10-CM | POA: Diagnosis not present

## 2012-07-09 DIAGNOSIS — E876 Hypokalemia: Secondary | ICD-10-CM | POA: Insufficient documentation

## 2012-07-09 DIAGNOSIS — Z8719 Personal history of other diseases of the digestive system: Secondary | ICD-10-CM | POA: Diagnosis not present

## 2012-07-09 DIAGNOSIS — Z79899 Other long term (current) drug therapy: Secondary | ICD-10-CM | POA: Insufficient documentation

## 2012-07-09 DIAGNOSIS — R112 Nausea with vomiting, unspecified: Secondary | ICD-10-CM | POA: Insufficient documentation

## 2012-07-09 DIAGNOSIS — R1084 Generalized abdominal pain: Secondary | ICD-10-CM | POA: Diagnosis not present

## 2012-07-09 DIAGNOSIS — J4489 Other specified chronic obstructive pulmonary disease: Secondary | ICD-10-CM | POA: Insufficient documentation

## 2012-07-09 DIAGNOSIS — R1110 Vomiting, unspecified: Secondary | ICD-10-CM

## 2012-07-09 LAB — CBC
HCT: 42.3 % (ref 36.0–46.0)
Hemoglobin: 14 g/dL (ref 12.0–15.0)
MCH: 29.5 pg (ref 26.0–34.0)
MCHC: 33.1 g/dL (ref 30.0–36.0)
MCV: 89.1 fL (ref 78.0–100.0)

## 2012-07-09 LAB — BASIC METABOLIC PANEL
BUN: 12 mg/dL (ref 6–23)
CO2: 24 mEq/L (ref 19–32)
Calcium: 10.1 mg/dL (ref 8.4–10.5)
Creatinine, Ser: 0.76 mg/dL (ref 0.50–1.10)
Glucose, Bld: 126 mg/dL — ABNORMAL HIGH (ref 70–99)

## 2012-07-09 LAB — POCT I-STAT TROPONIN I

## 2012-07-09 MED ORDER — IOHEXOL 300 MG/ML  SOLN
20.0000 mL | INTRAMUSCULAR | Status: AC
  Administered 2012-07-09: 20 mL via ORAL

## 2012-07-09 MED ORDER — MORPHINE SULFATE 4 MG/ML IJ SOLN
4.0000 mg | Freq: Once | INTRAMUSCULAR | Status: AC
  Administered 2012-07-10: 4 mg via INTRAVENOUS
  Filled 2012-07-09: qty 1

## 2012-07-09 MED ORDER — ONDANSETRON HCL 4 MG/2ML IJ SOLN
4.0000 mg | Freq: Once | INTRAMUSCULAR | Status: AC
  Administered 2012-07-10: 4 mg via INTRAVENOUS
  Filled 2012-07-09: qty 2

## 2012-07-09 MED ORDER — SODIUM CHLORIDE 0.9 % IV BOLUS (SEPSIS): 1000.0000 mL | Freq: Once | INTRAVENOUS | Status: DC

## 2012-07-09 NOTE — ED Notes (Addendum)
Pt reports generalized abd pain, N/V x2 days after eating at [facility] Wednesday. Pt reports mid-sternum chest pain starting today, pt sts "my chest pain started after I vomited a gallon of stuff." pt reports SOB w/a hx of the same, this is not a new symptom, pt has a hx of COPD

## 2012-07-09 NOTE — ED Provider Notes (Signed)
History     CSN: [id number]  Arrival date & time 07/09/12  7829   First MD Initiated Contact with Patient 07/09/12 2153      Chief Complaint  Patient presents with  . Chest Pain  . Abdominal Pain    (Consider location/radiation/quality/duration/timing/severity/associated sxs/prior treatment) HPI Comments: This is a 61 year old female, who presents to the emergency department with chief complaint of chest pain and abdominal pain. She also complains of associated nausea and vomiting since Wednesday. She denies having bowel movements also since Wednesday. She has not tried taking anything to alleviate her symptoms, and has been unable to keep anything down. She is in an 8/10 pain, which does not radiate.   The history is provided by the patient. No language interpreter was used.    Past Medical History  Diagnosis Date  . Nausea & vomiting   . COPD (chronic obstructive pulmonary disease)   . Smoker   . PSORIASIS 12/29/2006    Qualifier: Diagnosis of  By: Reche Dixon MD, Onalee Hua    . Hx of colonic polyps   . Asthma     Past Surgical History  Procedure Date  . Partial hysterectomy   . Tonsillectomy   . Appendectomy   . Colonoscopy w/ polypectomy 02/26/2005  . Upper gastrointestinal endoscopy 03/22/2010, 02/13/2005  . Hernia repair     x 2    Family History  Problem Relation Age of Onset  . Cancer Other   . Coronary artery disease Other     History  Substance Use Topics  . Smoking status: Current Some Day Smoker -- 0.2 packs/day  . Smokeless tobacco: Never Used  . Alcohol Use: No     Comment: Quit alcohol in 1992    OB History    Grav Para Term Preterm Abortions TAB SAB Ect Mult Living                  Review of Systems  All other systems reviewed and are negative.    Allergies  Review of patient's allergies indicates no known allergies.  Home Medications   Current Outpatient Rx  Name  Route  Sig  Dispense  Refill  . ALBUTEROL SULFATE HFA 108 (90 BASE) MCG/ACT  IN AERS   Inhalation   Inhale 2 puffs into the lungs 3 (three) times daily.          Marland Kitchen LANSOPRAZOLE 30 MG PO CPDR   Oral   Take 30 mg by mouth daily.         Marland Kitchen ONDANSETRON HCL 4 MG PO TABS   Oral   Take 4 mg by mouth every 8 (eight) hours as needed. For nausea         . PROMETHAZINE HCL 25 MG PO TABS   Oral   Take 25 mg by mouth every 6 (six) hours as needed. nausea         . TIOTROPIUM BROMIDE MONOHYDRATE 18 MCG IN CAPS   Inhalation   Place 18 mcg into inhaler and inhale daily.           BP 143/88  Pulse 95  Temp 98.5 F (36.9 C) (Oral)  Resp 20  SpO2 98%  Physical Exam  Nursing note and vitals reviewed. Constitutional: She is oriented to person, place, and time. She appears well-developed and well-nourished.  HENT:  Head: Normocephalic and atraumatic.  Eyes: Conjunctivae normal and EOM are normal. Pupils are equal, round, and reactive to light.  Neck: Normal range of motion. Neck  supple.  Cardiovascular: Normal rate and regular rhythm.  Exam reveals no gallop and no friction rub.   No murmur heard. Pulmonary/Chest: Effort normal and breath sounds normal. No respiratory distress. She has no wheezes. She has no rales. She exhibits no tenderness.  Abdominal: Soft. Bowel sounds are normal. She exhibits no distension and no mass. There is no tenderness. There is no rebound and no guarding.       No peritoneal signs, no McBurney point tenderness, and no right upper quadrant tenderness. Patient has had previous appendectomy, hernia repair, and abdominal hysterectomy. Abdomen is nontender, and nondistended  Genitourinary:       Soft brown stool in the rectum, rectal tone intact.  Musculoskeletal: Normal range of motion. She exhibits no edema and no tenderness.  Neurological: She is alert and oriented to person, place, and time.  Skin: Skin is warm and dry.  Psychiatric: She has a normal mood and affect. Her behavior is normal. Judgment and thought content normal.     ED Course  Procedures (including critical care time)  Labs Reviewed  PRO B NATRIURETIC PEPTIDE - Abnormal; Notable for the following:    Pro B Natriuretic peptide (BNP) 232.4 (*)     All other components within normal limits  BASIC METABOLIC PANEL - Abnormal; Notable for the following:    Potassium 2.9 (*)     Glucose, Bld 126 (*)     All other components within normal limits  CBC  POCT I-STAT TROPONIN I   Dg Chest 2 View  07/09/2012  *RADIOLOGY REPORT*  Clinical Data: Chest pain  CHEST - 2 VIEW  Comparison: 02/09/2012  Findings: Cardiomediastinal silhouette is stable.  No acute infiltrate or pleural effusion.  No pulmonary edema.  Bony thorax is stable.  IMPRESSION: No active disease.  No significant change.   Original Report Authenticated By: Natasha Mead, M.D.    Results for orders placed during the hospital encounter of 07/09/12  CBC      Component Value Range   WBC 8.9  4.0 - 10.5 K/uL   RBC 4.75  3.87 - 5.11 MIL/uL   Hemoglobin 14.0  12.0 - 15.0 g/dL   HCT 16.1  09.6 - 04.5 %   MCV 89.1  78.0 - 100.0 fL   MCH 29.5  26.0 - 34.0 pg   MCHC 33.1  30.0 - 36.0 g/dL   RDW 40.9  81.1 - 91.4 %   Platelets 260  150 - 400 K/uL  PRO B NATRIURETIC PEPTIDE      Component Value Range   Pro B Natriuretic peptide (BNP) 232.4 (*) 0 - 125 pg/mL  BASIC METABOLIC PANEL      Component Value Range   Sodium 139  135 - 145 mEq/L   Potassium 2.9 (*) 3.5 - 5.1 mEq/L   Chloride 99  96 - 112 mEq/L   CO2 24  19 - 32 mEq/L   Glucose, Bld 126 (*) 70 - 99 mg/dL   BUN 12  6 - 23 mg/dL   Creatinine, Ser 7.82  0.50 - 1.10 mg/dL   Calcium 95.6  8.4 - 21.3 mg/dL   GFR calc non Af Amer >90  >90 mL/min   GFR calc Af Amer >90  >90 mL/min  POCT I-STAT TROPONIN I      Component Value Range   Troponin i, poc 0.00  0.00 - 0.08 ng/mL   Comment 3           OCCULT BLOOD, POC DEVICE  Component Value Range   Fecal Occult Bld NEGATIVE     Dg Chest 2 View  07/09/2012  *RADIOLOGY REPORT*  Clinical Data:  Chest pain  CHEST - 2 VIEW  Comparison: 02/09/2012  Findings: Cardiomediastinal silhouette is stable.  No acute infiltrate or pleural effusion.  No pulmonary edema.  Bony thorax is stable.  IMPRESSION: No active disease.  No significant change.   Original Report Authenticated By: Natasha Mead, M.D.    Dg Abd 2 Views  07/09/2012  *RADIOLOGY REPORT*  Clinical Data: Abdominal pain, nausea, vomiting and constipation.  ABDOMEN - 2 VIEW  Comparison: Abdominal radiograph performed 11/12/2011, and CT of the abdomen and pelvis performed 02/09/2012  Findings: The visualized bowel gas pattern is unremarkable. Scattered air filled loops of colon are seen; no abnormal dilatation of small bowel loops is seen to suggest small bowel obstruction.  No free intra-abdominal air is identified on the provided upright view.  The visualized osseous structures are within normal limits; the sacroiliac joints are unremarkable in appearance.  The visualized lung bases are essentially clear.  IMPRESSION: Unremarkable bowel gas pattern; no free intra-abdominal air seen. No radiographic evidence for constipation.   Original Report Authenticated By: Tonia Ghent, M.D.       ED ECG REPORT  I personally interpreted this EKG   Date: 07/09/2012   Rate: 108  Rhythm: sinus tachycardia  QRS Axis: normal  Intervals: normal  ST/T Wave abnormalities: nonspecific ST changes  Conduction Disutrbances:none  Narrative Interpretation:   Old EKG Reviewed: unchanged    No diagnosis found.    MDM  61 year old female with abdominal pain and vague chest pain since Wednesday. Patient's EKG is unchanged from previous EKGs.  This patient has been seen by and discussed with Dr. Ethelda Chick.  Suspect that the pain is related to the abdominal pain.  Will supplement potassium.  12:45 AM  Patient is drinking her contrast, states that she is feeling better after the pain meds and zofran.  I have discussed this patient with Dr. Lorenso Courier, who will  continue care at this time.        Roxy Horseman, PA-C 07/10/12 [phone]

## 2012-07-10 ENCOUNTER — Encounter (HOSPITAL_COMMUNITY): Payer: Self-pay | Admitting: Radiology

## 2012-07-10 ENCOUNTER — Emergency Department (HOSPITAL_COMMUNITY): Payer: Medicare Other

## 2012-07-10 MED ORDER — POTASSIUM CHLORIDE ER 10 MEQ PO TBCR: 10.0000 meq | EXTENDED_RELEASE_TABLET | Freq: Two times a day (BID) | ORAL | Status: DC

## 2012-07-10 MED ORDER — IOHEXOL 300 MG/ML  SOLN
80.0000 mL | Freq: Once | INTRAMUSCULAR | Status: AC | PRN
  Administered 2012-07-10: 80 mL via INTRAVENOUS

## 2012-07-10 MED ORDER — ONDANSETRON 4 MG PO TBDP: 4.0000 mg | ORAL_TABLET | Freq: Three times a day (TID) | ORAL | Status: DC | PRN

## 2012-07-10 MED ORDER — PROMETHAZINE HCL 25 MG PO TABS: 25.0000 mg | ORAL_TABLET | Freq: Four times a day (QID) | ORAL | Status: DC | PRN

## 2012-07-10 MED ORDER — ONDANSETRON HCL 4 MG/2ML IJ SOLN
4.0000 mg | Freq: Once | INTRAMUSCULAR | Status: AC
  Administered 2012-07-10: 4 mg via INTRAVENOUS
  Filled 2012-07-10: qty 2

## 2012-07-10 MED ORDER — OXYCODONE-ACETAMINOPHEN 5-325 MG PO TABS: 1.0000 | ORAL_TABLET | Freq: Four times a day (QID) | ORAL | Status: DC | PRN

## 2012-07-10 MED ORDER — POTASSIUM CHLORIDE 10 MEQ/100ML IV SOLN
10.0000 meq | Freq: Once | INTRAVENOUS | Status: AC
  Administered 2012-07-10: 10 meq via INTRAVENOUS
  Filled 2012-07-10: qty 100

## 2012-07-10 NOTE — ED Provider Notes (Signed)
Complains of diffuse abdominal pain onset 2-3 days ago constant. Accompanied by vomiting multiple times patient has not been about the past several days. Patient has had symptoms similar symptoms in the past been evaluated by gastroenterology. On exam patient is alert appears mildly uncomfortable Glasgow Coma Score 15 lungs clear auscultation heart regular rate and rhythm abdomen normal bowel sounds nondistended diffuse tenderness no guarding rigidity or rebound   Assessment EKG is nonacute negative cardiac markers gallop cardiac etiology of patient's symptoms  Doug Sou, MD 07/10/12 0151

## 2012-07-10 NOTE — ED Provider Notes (Signed)
Medical screening examination/treatment/procedure(s) were conducted as a shared visit with non-physician practitioner(s) and myself.  I personally evaluated the patient during the encounter  Doug Sou, MD 07/10/12 [phone]

## 2012-07-10 NOTE — ED Provider Notes (Signed)
Received sign-over at 0100.  Pt was awaiting the results of a CT scan of the abdomen.  There is no evidence of an acute intraabdominal process.  She is receiving a po challenge.  Plan to discharge home to follow-up closely with her primary physician.  Will provide prescriptions for an antiemetic and percocet.  She should return to the ER if the pain worsening or is associated with intractable vomiting or persistent fever.  Tobin Chad, MD 07/10/12 [phone]

## 2012-07-16 ENCOUNTER — Ambulatory Visit (INDEPENDENT_AMBULATORY_CARE_PROVIDER_SITE_OTHER): Payer: Medicare Other | Admitting: Family Medicine

## 2012-07-16 ENCOUNTER — Encounter: Payer: Self-pay | Admitting: Family Medicine

## 2012-07-16 VITALS — BP 132/70 | HR 92 | Temp 98.1°F | Ht 67.0 in | Wt 141.0 lb

## 2012-07-16 DIAGNOSIS — Z23 Encounter for immunization: Secondary | ICD-10-CM

## 2012-07-16 DIAGNOSIS — R1115 Cyclical vomiting syndrome unrelated to migraine: Secondary | ICD-10-CM

## 2012-07-16 DIAGNOSIS — R1110 Vomiting, unspecified: Secondary | ICD-10-CM | POA: Insufficient documentation

## 2012-07-16 LAB — COMPREHENSIVE METABOLIC PANEL
Albumin: 4.2 g/dL (ref 3.5–5.2)
BUN: 9 mg/dL (ref 6–23)
Calcium: 9.2 mg/dL (ref 8.4–10.5)
Chloride: 103 mEq/L (ref 96–112)
Glucose, Bld: 80 mg/dL (ref 70–99)
Potassium: 3.5 mEq/L (ref 3.5–5.3)

## 2012-07-16 MED ORDER — PROMETHAZINE HCL 25 MG PO TABS: 25.0000 mg | ORAL_TABLET | Freq: Three times a day (TID) | ORAL | Status: DC | PRN

## 2012-07-16 MED ORDER — ONDANSETRON HCL 4 MG PO TABS: 4.0000 mg | ORAL_TABLET | Freq: Three times a day (TID) | ORAL | Status: DC | PRN

## 2012-07-16 NOTE — Progress Notes (Signed)
  Subjective:    Patient ID: [name], female    DOB: Oct 08, 1950, 61 y.o.   MRN: [id number]  HPI Patient is a 61 yo female who presents with complaints of nausea, vomiting, and abdominal pain.  States has been having abdominal pain for many years, going back to when she started her menstrual cycle. Current abdominal pain is located on right middle and lower quadrants. But her nausea and vomiting is new over the past year. States was recently seen in the ED for this issue and was given phenergan and zofran for nausea.  States still can't eat or drink anything but broth, rice, and creamed potatoes. States is nauseated all the time and can only hold one meal down per day. Describes vomit as NBNB and consists of the food she has eaten.  Denies diarrhea, though states is not having BMs as she is not eating anything.  Last BM was last Wednesday. Denies dysuria. Endorses weight loss of 80 lbs over the last few years, though last weight in March was stable.  Notably is seen by Dr. Madilyn Fireman who recommends the patient have a colonoscopy.  Has had extensive workup of this GI issue including normal celiac labs, normal TSH and cortisol, normal gastric emptying study, normal CT abd/pelvis, normal abdominal XR with no evidence of constipation, normal abd [place] (though previous US showed gall stones).  Notably has also had appendix removed and had hysterectomy. Also, with history of multiple abdominal hernia repairs. Additionally patient does have family history of colon cancer.    Review of Systems negative except per HPI     Objective:   Physical Exam  Constitutional: She appears well-developed and well-nourished.  Cardiovascular: Normal rate, regular rhythm and normal heart sounds.   Pulmonary/Chest: Effort normal and breath sounds normal.  Abdominal: Soft. Bowel sounds are normal. She exhibits no distension and no mass (no organomegaly). There is tenderness (right middle and lower quadrants). There is guarding  (when palpating right side of abdomen). There is no rebound.   BP 132/70  Pulse 92  Temp 98.1 F (36.7 C) (Oral)  Ht 5\' 7"  (1.702 m)  Wt 141 lb (63.957 kg)  BMI 22.08 kg/m2      Assessment & Plan:

## 2012-07-16 NOTE — Patient Instructions (Addendum)
Nice to meet you today. Sorry to hear that you are having issues with your stomach. We will need to refer you for a colonoscopy. Call [facility] to schedule a colonoscopy at [phone].  We will also draw some labs today to look at your potassium level and liver. Please continue taking zofran and phenergan to help with your nausea. We will see you back in 2 weeks for follow-up. Thank you for your visit today.

## 2012-07-16 NOTE — Assessment & Plan Note (Signed)
Patient with long history of abdominal pain with more recent onset of nausea and vomiting and reported weight loss.  Had extensive GI workup that is normal and is outlined in HPI. So far workup has ruled out worrisome causes of this pain (i.e. Ovarian, lung, or brain cancer). Also, less likely related to gall stones or gastroparesis given negative workup.  Plan: next step in working up this issue would be colonoscopy.  Patient is to call Dr. Madilyn Fireman office to set this up. We will obtain Dr. Madilyn Fireman notes to see if he can give insight to this issue.  We will treat her symptoms with zofran and phenergan as these have proven beneficial for the patient. Will also obtain CMET.

## 2012-08-03 ENCOUNTER — Ambulatory Visit (INDEPENDENT_AMBULATORY_CARE_PROVIDER_SITE_OTHER): Payer: Medicare Other | Admitting: Family Medicine

## 2012-08-03 VITALS — BP 144/73 | HR 70 | Temp 98.2°F | Wt 140.0 lb

## 2012-08-03 DIAGNOSIS — J449 Chronic obstructive pulmonary disease, unspecified: Secondary | ICD-10-CM | POA: Diagnosis not present

## 2012-08-03 DIAGNOSIS — J4489 Other specified chronic obstructive pulmonary disease: Secondary | ICD-10-CM

## 2012-08-03 DIAGNOSIS — R1115 Cyclical vomiting syndrome unrelated to migraine: Secondary | ICD-10-CM | POA: Diagnosis not present

## 2012-08-03 MED ORDER — PROMETHAZINE HCL 25 MG PO TABS: 25.0000 mg | ORAL_TABLET | Freq: Three times a day (TID) | ORAL | Status: DC | PRN

## 2012-08-03 MED ORDER — TIOTROPIUM BROMIDE MONOHYDRATE 18 MCG IN CAPS: 18.0000 ug | ORAL_CAPSULE | Freq: Every day | RESPIRATORY_TRACT | Status: DC

## 2012-08-03 MED ORDER — ONDANSETRON HCL 4 MG PO TABS: 4.0000 mg | ORAL_TABLET | Freq: Three times a day (TID) | ORAL | Status: DC | PRN

## 2012-08-03 MED ORDER — ALBUTEROL SULFATE HFA 108 (90 BASE) MCG/ACT IN AERS: 2.0000 | INHALATION_SPRAY | Freq: Three times a day (TID) | RESPIRATORY_TRACT | Status: DC

## 2012-08-03 NOTE — Progress Notes (Signed)
  Subjective:    Patient ID: [name], female    DOB: 1951-04-10, 61 y.o.   MRN: [id number]  HPI Patient is a 61 yo female who presents in follow-up of her recurrent nausea and vomiting.  Cyclic vomiting: Patient states she continues to be nauseated daily. Also, vomiting 1x/day. Only able to eat one meal per day (grits or chicken broth). States drinking a coke helps calm stomach. Taking a zofran helps greatly reduce this nausea. Also taking phenergan with some relief. Previously had extensive work-up that revealed no cause for these symptoms. Has appointment with GI Dr. Madilyn Fireman on 12/9 for further evaluation and scheduling of colonoscopy.  Medication refill: has been out of spiriva and albuterol for COPD.  Has been using albuterol on a daily basis. States prior to not having spiriva, was using minimal albuterol.  Review of Systems see HPI     Objective:   Physical Exam  Constitutional: She appears well-developed and well-nourished.  HENT:  Head: Normocephalic and atraumatic.  Cardiovascular: Normal rate, regular rhythm and normal heart sounds.   Pulmonary/Chest: Effort normal and breath sounds normal. She has no wheezes.  Abdominal: Soft. Bowel sounds are normal. She exhibits no distension and no mass. There is no tenderness. There is no rebound and no guarding.          Assessment & Plan:

## 2012-08-03 NOTE — Patient Instructions (Signed)
Nice to see you again. I have refilled your zofran, phenergan, spiriva, and albuterol. Please keep your appointment with Dr. Madilyn Fireman.  I will plan on seeing you back in about a month to see how you are doing following your colonoscopy. Thank you for your visit today.

## 2012-08-03 NOTE — Assessment & Plan Note (Signed)
Continues to have issue with nausea and vomiting that are somewhat improved with use of zofran and phenergan. Plan: attend appointment with Dr. Madilyn Fireman and obtain colonoscopy. Refilled zofran and phenergan. Depending on Dr. Madilyn Fireman opinion and colo results will potentially refer to functional GI clinic at [facility] for further evaluation given negative workup.

## 2012-08-03 NOTE — Assessment & Plan Note (Signed)
Refilled spiriva and albuterol. Will continue to follow.

## 2012-08-09 DIAGNOSIS — Z800 Family history of malignant neoplasm of digestive organs: Secondary | ICD-10-CM | POA: Diagnosis not present

## 2012-08-09 DIAGNOSIS — R112 Nausea with vomiting, unspecified: Secondary | ICD-10-CM | POA: Diagnosis not present

## 2012-08-09 DIAGNOSIS — R1011 Right upper quadrant pain: Secondary | ICD-10-CM | POA: Diagnosis not present

## 2012-10-06 DIAGNOSIS — H2510 Age-related nuclear cataract, unspecified eye: Secondary | ICD-10-CM | POA: Diagnosis not present

## 2012-10-12 ENCOUNTER — Emergency Department (HOSPITAL_COMMUNITY)
Admission: EM | Admit: 2012-10-12 | Discharge: 2012-10-12 | Disposition: A | Discharge status: Home / Self Care | Payer: Medicare Other | Attending: Emergency Medicine | Admitting: Emergency Medicine

## 2012-10-12 ENCOUNTER — Encounter (HOSPITAL_COMMUNITY): Payer: Self-pay | Admitting: Emergency Medicine

## 2012-10-12 ENCOUNTER — Emergency Department (HOSPITAL_COMMUNITY): Payer: Medicare Other

## 2012-10-12 DIAGNOSIS — R112 Nausea with vomiting, unspecified: Secondary | ICD-10-CM | POA: Diagnosis not present

## 2012-10-12 DIAGNOSIS — R059 Cough, unspecified: Secondary | ICD-10-CM | POA: Insufficient documentation

## 2012-10-12 DIAGNOSIS — Z87891 Personal history of nicotine dependence: Secondary | ICD-10-CM | POA: Insufficient documentation

## 2012-10-12 DIAGNOSIS — R0602 Shortness of breath: Secondary | ICD-10-CM | POA: Insufficient documentation

## 2012-10-12 DIAGNOSIS — R1084 Generalized abdominal pain: Secondary | ICD-10-CM | POA: Insufficient documentation

## 2012-10-12 DIAGNOSIS — R05 Cough: Secondary | ICD-10-CM | POA: Insufficient documentation

## 2012-10-12 DIAGNOSIS — G8929 Other chronic pain: Secondary | ICD-10-CM | POA: Diagnosis not present

## 2012-10-12 DIAGNOSIS — R071 Chest pain on breathing: Secondary | ICD-10-CM | POA: Insufficient documentation

## 2012-10-12 DIAGNOSIS — R109 Unspecified abdominal pain: Secondary | ICD-10-CM

## 2012-10-12 DIAGNOSIS — Z872 Personal history of diseases of the skin and subcutaneous tissue: Secondary | ICD-10-CM | POA: Insufficient documentation

## 2012-10-12 DIAGNOSIS — Z79899 Other long term (current) drug therapy: Secondary | ICD-10-CM | POA: Diagnosis not present

## 2012-10-12 DIAGNOSIS — R079 Chest pain, unspecified: Secondary | ICD-10-CM | POA: Diagnosis not present

## 2012-10-12 DIAGNOSIS — J45909 Unspecified asthma, uncomplicated: Secondary | ICD-10-CM | POA: Insufficient documentation

## 2012-10-12 DIAGNOSIS — Z860100 Personal history of colon polyps, unspecified: Secondary | ICD-10-CM | POA: Insufficient documentation

## 2012-10-12 DIAGNOSIS — J449 Chronic obstructive pulmonary disease, unspecified: Secondary | ICD-10-CM | POA: Insufficient documentation

## 2012-10-12 DIAGNOSIS — J4489 Other specified chronic obstructive pulmonary disease: Secondary | ICD-10-CM | POA: Insufficient documentation

## 2012-10-12 LAB — CBC WITH DIFFERENTIAL/PLATELET
Basophils Absolute: 0 10*3/uL (ref 0.0–0.1)
Basophils Relative: 0 % (ref 0–1)
Eosinophils Absolute: 0 10*3/uL (ref 0.0–0.7)
Eosinophils Relative: 0 % (ref 0–5)
HCT: 38.2 % (ref 36.0–46.0)
Hemoglobin: 12.7 g/dL (ref 12.0–15.0)
Lymphocytes Relative: 16 % (ref 12–46)
Lymphs Abs: 1.4 10*3/uL (ref 0.7–4.0)
MCH: 29.5 pg (ref 26.0–34.0)
MCHC: 33.2 g/dL (ref 30.0–36.0)
MCV: 88.8 fL (ref 78.0–100.0)
Monocytes Absolute: 0.2 10*3/uL (ref 0.1–1.0)
Monocytes Relative: 2 % — ABNORMAL LOW (ref 3–12)
Neutro Abs: 7.3 10*3/uL (ref 1.7–7.7)
Neutrophils Relative %: 82 % — ABNORMAL HIGH (ref 43–77)
Platelets: 290 10*3/uL (ref 150–400)
RBC: 4.3 MIL/uL (ref 3.87–5.11)
RDW: 13.3 % (ref 11.5–15.5)
WBC: 8.9 10*3/uL (ref 4.0–10.5)

## 2012-10-12 LAB — COMPREHENSIVE METABOLIC PANEL
ALT: 6 U/L (ref 0–35)
AST: 15 U/L (ref 0–37)
Albumin: 4.5 g/dL (ref 3.5–5.2)
Alkaline Phosphatase: 110 U/L (ref 39–117)
BUN: 11 mg/dL (ref 6–23)
CO2: 21 mEq/L (ref 19–32)
Calcium: 10.3 mg/dL (ref 8.4–10.5)
Chloride: 99 mEq/L (ref 96–112)
Creatinine, Ser: 0.7 mg/dL (ref 0.50–1.10)
GFR calc Af Amer: >90 mL/min (ref >90)
GFR calc non Af Amer: >90 mL/min (ref >90)
Glucose, Bld: 150 mg/dL — ABNORMAL HIGH (ref 70–99)
Potassium: 3.9 mEq/L (ref 3.5–5.1)
Sodium: 139 mEq/L (ref 135–145)
Total Bilirubin: 0.3 mg/dL (ref 0.3–1.2)
Total Protein: 8.2 g/dL (ref 6.0–8.3)

## 2012-10-12 LAB — LIPASE, BLOOD: Lipase: 30 U/L (ref 11–59)

## 2012-10-12 LAB — URINALYSIS, ROUTINE W REFLEX MICROSCOPIC
Bilirubin Urine: NEGATIVE
Glucose, UA: NEGATIVE mg/dL
Hgb urine dipstick: NEGATIVE
Ketones, ur: 15 mg/dL — AB
Nitrite: NEGATIVE
Protein, ur: NEGATIVE mg/dL
Specific Gravity, Urine: 1.018 (ref 1.005–1.030)
Urobilinogen, UA: 1 mg/dL (ref 0.0–1.0)
pH: 8.5 — ABNORMAL HIGH (ref 5.0–8.0)

## 2012-10-12 LAB — URINE MICROSCOPIC-ADD ON

## 2012-10-12 LAB — TROPONIN I: Troponin I: <0.3 ng/mL (ref <0.30)

## 2012-10-12 MED ORDER — ONDANSETRON HCL 4 MG/2ML IJ SOLN
4.0000 mg | Freq: Once | INTRAMUSCULAR | Status: AC
  Administered 2012-10-12: 4 mg via INTRAVENOUS
  Filled 2012-10-12: qty 2

## 2012-10-12 MED ORDER — SODIUM CHLORIDE 0.9 % IV BOLUS (SEPSIS)
1000.0000 mL | Freq: Once | INTRAVENOUS | Status: AC
  Administered 2012-10-12: 1000 mL via INTRAVENOUS

## 2012-10-12 MED ORDER — PROMETHAZINE HCL 25 MG/ML IJ SOLN
12.5000 mg | Freq: Once | INTRAMUSCULAR | Status: AC
  Administered 2012-10-12: 12.5 mg via INTRAVENOUS
  Filled 2012-10-12: qty 1

## 2012-10-12 MED ORDER — MORPHINE SULFATE 4 MG/ML IJ SOLN
6.0000 mg | Freq: Once | INTRAMUSCULAR | Status: AC
  Administered 2012-10-12: 6 mg via INTRAVENOUS
  Filled 2012-10-12: qty 2

## 2012-10-12 MED ORDER — HYDROCODONE-ACETAMINOPHEN 5-325 MG PO TABS: 1.0000 | ORAL_TABLET | Freq: Four times a day (QID) | ORAL | Status: DC | PRN

## 2012-10-12 MED ORDER — PROMETHAZINE HCL 25 MG PO TABS: 25.0000 mg | ORAL_TABLET | Freq: Four times a day (QID) | ORAL | Status: DC | PRN

## 2012-10-12 NOTE — ED Provider Notes (Signed)
History     CSN: [id number]  Arrival date & time 10/12/12  4098   First MD Initiated Contact with Patient 10/12/12 0759      Chief Complaint  Patient presents with  . Chest Pain  . Emesis    (Consider location/radiation/quality/duration/timing/severity/associated sxs/prior treatment) HPI Patient is a 62 year old female who presents with chest pain and emesis.  She has been to the ED in November for the same complaint and her workup was negative.  She has seen her PCP and they recommended getting a colonoscopy that she hasn't done yet because she hasn't felt well.  She states there have been no changes since her last visit here and if anything, the nausea and vomiting has gotten worse.  She is unable to much because she vomits anything she eats and doesn't have an appetite.  Denies any fever but complains of cough, shortness of breath and weight loss.  Her chest pain is under the left breast and talking/coughing makes it worse.  Nothing seems to make it better.   Past Medical History  Diagnosis Date  . Nausea & vomiting   . COPD (chronic obstructive pulmonary disease)   . Smoker   . PSORIASIS 12/29/2006    Qualifier: Diagnosis of  By: Reche Dixon MD, Onalee Hua    . Hx of colonic polyps   . Asthma     Past Surgical History  Procedure Laterality Date  . Partial hysterectomy    . Tonsillectomy    . Appendectomy    . Colonoscopy w/ polypectomy  02/26/2005  . Upper gastrointestinal endoscopy  03/22/2010, 02/13/2005  . Hernia repair      x 2    Family History  Problem Relation Age of Onset  . Cancer Other   . Coronary artery disease Other   . Cancer Mother   . Hypertension Mother   . Heart disease Mother   . Cancer Father   . Hypertension Father   . Cancer Sister   . Hypertension Sister     History  Substance Use Topics  . Smoking status: Former Smoker -- 0.25 packs/day  . Smokeless tobacco: Never Used  . Alcohol Use: No     Comment: Quit alcohol in 1992    OB History   Grav Para Term Preterm Abortions TAB SAB Ect Mult Living                  Review of Systems All other systems negative except as documented in the HPI. All pertinent positives and negatives as reviewed in the HPI.  Allergies  Review of patient's allergies indicates no known allergies.  Home Medications   Current Outpatient Rx  Name  Route  Sig  Dispense  Refill  . albuterol (PROVENTIL HFA;VENTOLIN HFA) 108 (90 BASE) MCG/ACT inhaler   Inhalation   Inhale 2 puffs into the lungs 3 (three) times daily.         . lansoprazole (PREVACID) 30 MG capsule   Oral   Take 30 mg by mouth daily.         . ondansetron (ZOFRAN) 4 MG tablet   Oral   Take 4 mg by mouth every 8 (eight) hours as needed for nausea.         . promethazine (PHENERGAN) 25 MG tablet   Oral   Take 25 mg by mouth every 8 (eight) hours as needed for nausea.         Marland Kitchen tiotropium (SPIRIVA) 18 MCG inhalation capsule  Inhalation   Place 18 mcg into inhaler and inhale daily.           BP 156/78  Pulse 90  Temp(Src) 98 F (36.7 C) (Oral)  Resp 18  Ht 5\' 6"  (1.676 m)  Wt 140 lb (63.504 kg)  BMI 22.61 kg/m2  SpO2 97%  Physical Exam  Constitutional: She is oriented to person, place, and time. She appears well-developed and well-nourished.  HENT:  Head: Normocephalic and atraumatic.  Mouth/Throat: Uvula is midline, oropharynx is clear and moist and mucous membranes are normal.  Eyes: Conjunctivae, EOM and lids are normal. Pupils are equal, round, and reactive to light.  Neck: Normal range of motion.  Cardiovascular: Normal rate, regular rhythm and normal heart sounds.   Pulmonary/Chest: Effort normal and breath sounds normal. She has no decreased breath sounds. She has no wheezes. She has no rhonchi. She has no rales.  Abdominal: Soft. Normal appearance and bowel sounds are normal. There is generalized tenderness.  Neurological: She is alert and oriented to person, place, and time. No cranial nerve  deficit or sensory deficit.  Skin: No rash noted.  Psychiatric: She has a normal mood and affect. Her speech is normal and behavior is normal. Judgment and thought content normal. Cognition and memory are normal.    ED Course  Procedures (including critical care time)  Labs Reviewed  COMPREHENSIVE METABOLIC PANEL - Abnormal; Notable for the following:    Glucose, Bld 150 (*)    All other components within normal limits  CBC WITH DIFFERENTIAL - Abnormal; Notable for the following:    Neutrophils Relative 82 (*)    Monocytes Relative 2 (*)    All other components within normal limits  URINALYSIS, ROUTINE W REFLEX MICROSCOPIC - Abnormal; Notable for the following:    APPearance CLOUDY (*)    pH 8.5 (*)    Ketones, ur 15 (*)    Leukocytes, UA TRACE (*)    All other components within normal limits  URINE MICROSCOPIC-ADD ON - Abnormal; Notable for the following:    Squamous Epithelial / LPF MANY (*)    All other components within normal limits  LIPASE, BLOOD  TROPONIN I   Dg Chest 2 View  10/12/2012  *RADIOLOGY REPORT*  Clinical Data: Shortness of breath, nausea, vomiting  CHEST - 2 VIEW  Comparison: 07/09/2012  Findings: Cardiomediastinal silhouette is stable.  Mild hyperinflation again noted.  Bony thorax is stable.  No acute infiltrate or pleural effusion.  IMPRESSION: No active disease.  Stable examination.   Original Report Authenticated By: Natasha Mead, M.D.      1. Nausea & vomiting   2. Chronic abdominal pain     This patient's chronic abdominal pain, and she does not have any changes in her current condition, and she is feeling better.  At this time.  She has had oral fluids without difficulty and the patient be discharged home with followup with her primary care doctor.  The patient is advised that if the condition changes or worsens.  She is to return to the emergency department for further evaluation.  I advised the patient that if she were to start having chest pain, that  she also need to return to the emergency department.  The patient, states that she was having pain.  Previously, yesterday, with her chest, but not today, as was documented by the nursing staff.   MDM  MDM Reviewed: vitals and nursing note Interpretation: labs, ECG and x-ray  Carlyle Dolly, PA-C 10/13/12 1515

## 2012-10-12 NOTE — ED Notes (Signed)
Patient claims started having chest pain last night.  Patient claims that she had symptoms of nausea and vomiting all day yesterday, but denies chest pain at that time.  Patient claims pain is squeezing in left chest under breast with no radiation.

## 2012-10-13 NOTE — ED Provider Notes (Signed)
Medical screening examination/treatment/procedure(s) were performed by non-physician practitioner and as supervising physician I was immediately available for consultation/collaboration.   Gazella Anglin B. Bernette Mayers, MD 10/13/12 1553

## 2013-02-23 ENCOUNTER — Encounter: Payer: Self-pay | Admitting: Family Medicine

## 2013-02-23 ENCOUNTER — Encounter (HOSPITAL_COMMUNITY): Payer: Self-pay | Admitting: *Deleted

## 2013-02-23 ENCOUNTER — Emergency Department (HOSPITAL_COMMUNITY)
Admission: EM | Admit: 2013-02-23 | Discharge: 2013-02-23 | Discharge status: Against Medical Advice | Payer: Medicare Other | Attending: Emergency Medicine | Admitting: Emergency Medicine

## 2013-02-23 ENCOUNTER — Ambulatory Visit (INDEPENDENT_AMBULATORY_CARE_PROVIDER_SITE_OTHER): Payer: Medicare Other | Admitting: Family Medicine

## 2013-02-23 VITALS — BP 151/81 | HR 86 | Temp 98.0°F | Wt 121.0 lb

## 2013-02-23 DIAGNOSIS — R079 Chest pain, unspecified: Secondary | ICD-10-CM | POA: Diagnosis not present

## 2013-02-23 DIAGNOSIS — J4489 Other specified chronic obstructive pulmonary disease: Secondary | ICD-10-CM | POA: Insufficient documentation

## 2013-02-23 DIAGNOSIS — R1013 Epigastric pain: Secondary | ICD-10-CM

## 2013-02-23 DIAGNOSIS — R1110 Vomiting, unspecified: Secondary | ICD-10-CM | POA: Insufficient documentation

## 2013-02-23 DIAGNOSIS — R109 Unspecified abdominal pain: Secondary | ICD-10-CM | POA: Diagnosis not present

## 2013-02-23 DIAGNOSIS — F17200 Nicotine dependence, unspecified, uncomplicated: Secondary | ICD-10-CM | POA: Insufficient documentation

## 2013-02-23 DIAGNOSIS — J449 Chronic obstructive pulmonary disease, unspecified: Secondary | ICD-10-CM | POA: Diagnosis not present

## 2013-02-23 DIAGNOSIS — R112 Nausea with vomiting, unspecified: Secondary | ICD-10-CM

## 2013-02-23 HISTORY — DX: Epigastric pain: R10.13

## 2013-02-23 MED ORDER — PROMETHAZINE HCL 25 MG/ML IJ SOLN
25.0000 mg | Freq: Once | INTRAMUSCULAR | Status: AC
  Administered 2013-02-23: 25 mg via INTRAMUSCULAR

## 2013-02-23 MED ORDER — PROMETHAZINE HCL 25 MG PO TABS: 25.0000 mg | ORAL_TABLET | Freq: Four times a day (QID) | ORAL | Status: DC | PRN

## 2013-02-23 MED ORDER — HYDROCODONE-ACETAMINOPHEN 5-325 MG PO TABS: 1.0000 | ORAL_TABLET | Freq: Four times a day (QID) | ORAL | Status: DC | PRN

## 2013-02-23 MED ORDER — ONDANSETRON HCL 4 MG PO TABS: 4.0000 mg | ORAL_TABLET | Freq: Three times a day (TID) | ORAL | Status: DC | PRN

## 2013-02-23 NOTE — ED Notes (Signed)
Unable to obtain temperature. Pt actively vomiting

## 2013-02-23 NOTE — Patient Instructions (Signed)
I am sorry you do not feel well.  I am checking some blood work, I will send you a letter with your lab results, or call you if anything is abnormal.    Please call your gastroenterologist and make an appointment as soon as possible.   Please try the zofran and phenergan for nausea, and the hydrocodone for pain.

## 2013-02-23 NOTE — Assessment & Plan Note (Signed)
This is a chronic problem, Rx zofran and phenergan, discussed hydration.  Will check CBC, CMET, Lipase to eval for dangerous causes.  Advised to call GI doctor to make apointment ASAP.

## 2013-02-23 NOTE — ED Notes (Signed)
Pt in c/o abd pain, vomiting and chest pain since Saturday, pt states she has a history of gallstone and this feels similar, pt actively vomiting in triage. Pt states she has been seen recently for similar symptoms.

## 2013-02-23 NOTE — Progress Notes (Signed)
  Subjective:    Patient ID: [name], female    DOB: 10/06/1950, 62 y.o.   MRN: [id number]  HPI:  [name] comes in with nausea, vomiting, abdominal pain for 3 days.  She was going to go to the ER but the wait was 3 hours so she walked in to clinic.  She denies bloody or bilious vomiting.  She sees Dr. Madilyn Fireman with GI, and seem to think she has gall stones, although a [place] 1 year ago shows no stones.  She has not had fever, chills.  Says she cannot keep much down but a little fluid.  Her pain is epigastric, it hurts worse with eating and when she vomits.   Past Medical History  Diagnosis Date  . Nausea & vomiting   . COPD (chronic obstructive pulmonary disease)   . Smoker   . PSORIASIS 12/29/2006    Qualifier: Diagnosis of  By: Reche Dixon MD, Onalee Hua    . Hx of colonic polyps   . Asthma     History  Substance Use Topics  . Smoking status: Current Some Day Smoker -- 0.25 packs/day  . Smokeless tobacco: Never Used  . Alcohol Use: No     Comment: Quit alcohol in 1992    Family History  Problem Relation Age of Onset  . Cancer Other   . Coronary artery disease Other   . Cancer Mother   . Hypertension Mother   . Heart disease Mother   . Cancer Father   . Hypertension Father   . Cancer Sister   . Hypertension Sister      ROS Pertinent items in HPI    Objective:  Physical Exam:  BP 151/81  Pulse 86  Temp(Src) 98 F (36.7 C) (Oral)  Wt 121 lb (54.885 kg)  BMI 19.54 kg/m2 General appearance: alert, cooperative and no distress Mouth:oral mucosa moist Lungs: clear to auscultation bilaterally Heart: regular rate and rhythm, S1, S2 normal, no murmur, click, rub or gallop Abd: +BS, soft, non-distended, mild diffuse TTP.        Assessment & Plan:

## 2013-02-23 NOTE — Assessment & Plan Note (Signed)
Again, chronic problem, rx hydrocodone for pain.  Check labs, f/u in 2 weeks, advised to call GI for appointment.

## 2013-02-24 LAB — CBC WITH DIFFERENTIAL/PLATELET
Basophils Relative: 0 % (ref 0–1)
HCT: 42.1 % (ref 36.0–46.0)
Hemoglobin: 13.9 g/dL (ref 12.0–15.0)
Lymphocytes Relative: 14 % (ref 12–46)
Lymphs Abs: 1 10*3/uL (ref 0.7–4.0)
MCHC: 33 g/dL (ref 30.0–36.0)
Monocytes Absolute: 0.2 10*3/uL (ref 0.1–1.0)
Monocytes Relative: 2 % — ABNORMAL LOW (ref 3–12)
Neutro Abs: 6.3 10*3/uL (ref 1.7–7.7)
Neutrophils Relative %: 84 % — ABNORMAL HIGH (ref 43–77)
RBC: 4.85 MIL/uL (ref 3.87–5.11)

## 2013-02-24 LAB — COMPREHENSIVE METABOLIC PANEL
Albumin: 4.5 g/dL (ref 3.5–5.2)
Alkaline Phosphatase: 94 U/L (ref 39–117)
BUN: 11 mg/dL (ref 6–23)
CO2: 24 mEq/L (ref 19–32)
Calcium: 10.2 mg/dL (ref 8.4–10.5)
Chloride: 102 mEq/L (ref 96–112)
Glucose, Bld: 118 mg/dL — ABNORMAL HIGH (ref 70–99)
Potassium: 3.9 mEq/L (ref 3.5–5.3)
Sodium: 140 mEq/L (ref 135–145)
Total Protein: 7.8 g/dL (ref 6.0–8.3)

## 2013-02-25 ENCOUNTER — Encounter: Payer: Self-pay | Admitting: Family Medicine

## 2013-02-28 ENCOUNTER — Telehealth: Payer: Self-pay | Admitting: Family Medicine

## 2013-02-28 NOTE — Telephone Encounter (Signed)
Pt called Dr Royston Bake [phone] to schedule an appt. Was told needed [place] to call and make the appt Please advise

## 2013-02-28 NOTE — Telephone Encounter (Signed)
Please coordinate appointment for this patient. I believe she was previously seen by this practice, so unsure why she couldn't schedule the appointment. She has chronic nausea, vomiting, and abdominal pain. Fleet Contras saw her on 6/25 for this and felt she needed to be seen by GI.

## 2013-03-01 NOTE — Telephone Encounter (Signed)
Appt is sched for [facility] Aug 4 at 3:30 w/Dr Madilyn Fireman. Msg is left on phone for pt.They have current [facility] referral

## 2013-03-17 IMAGING — CR DG CHEST 2V
2 series · 2 of 2 positions shown · non-contrast
Comparison: 07/09/2012

CLINICAL DATA: Shortness of breath, nausea, vomiting

CHEST - 2 VIEW

[w chest pa]
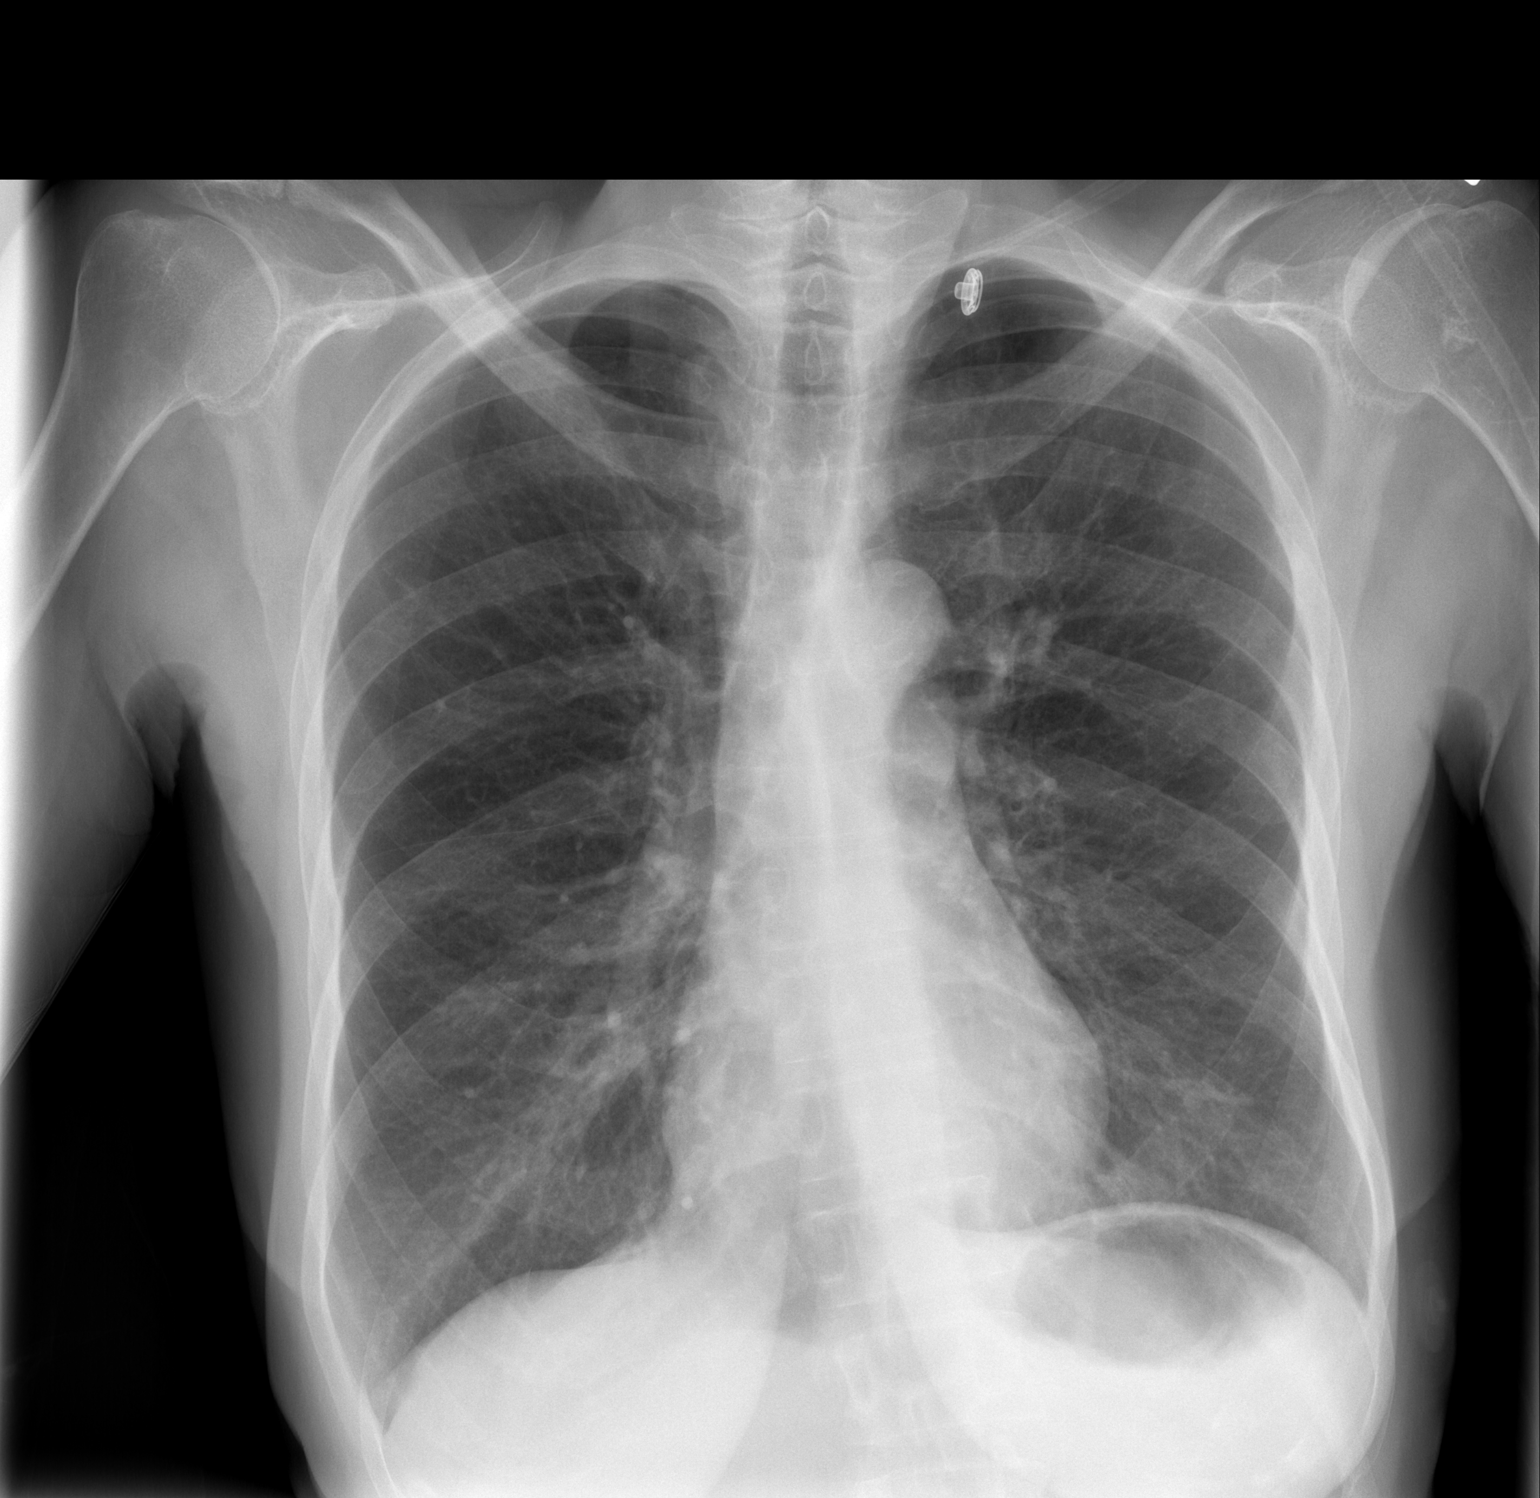

[w chest lat]
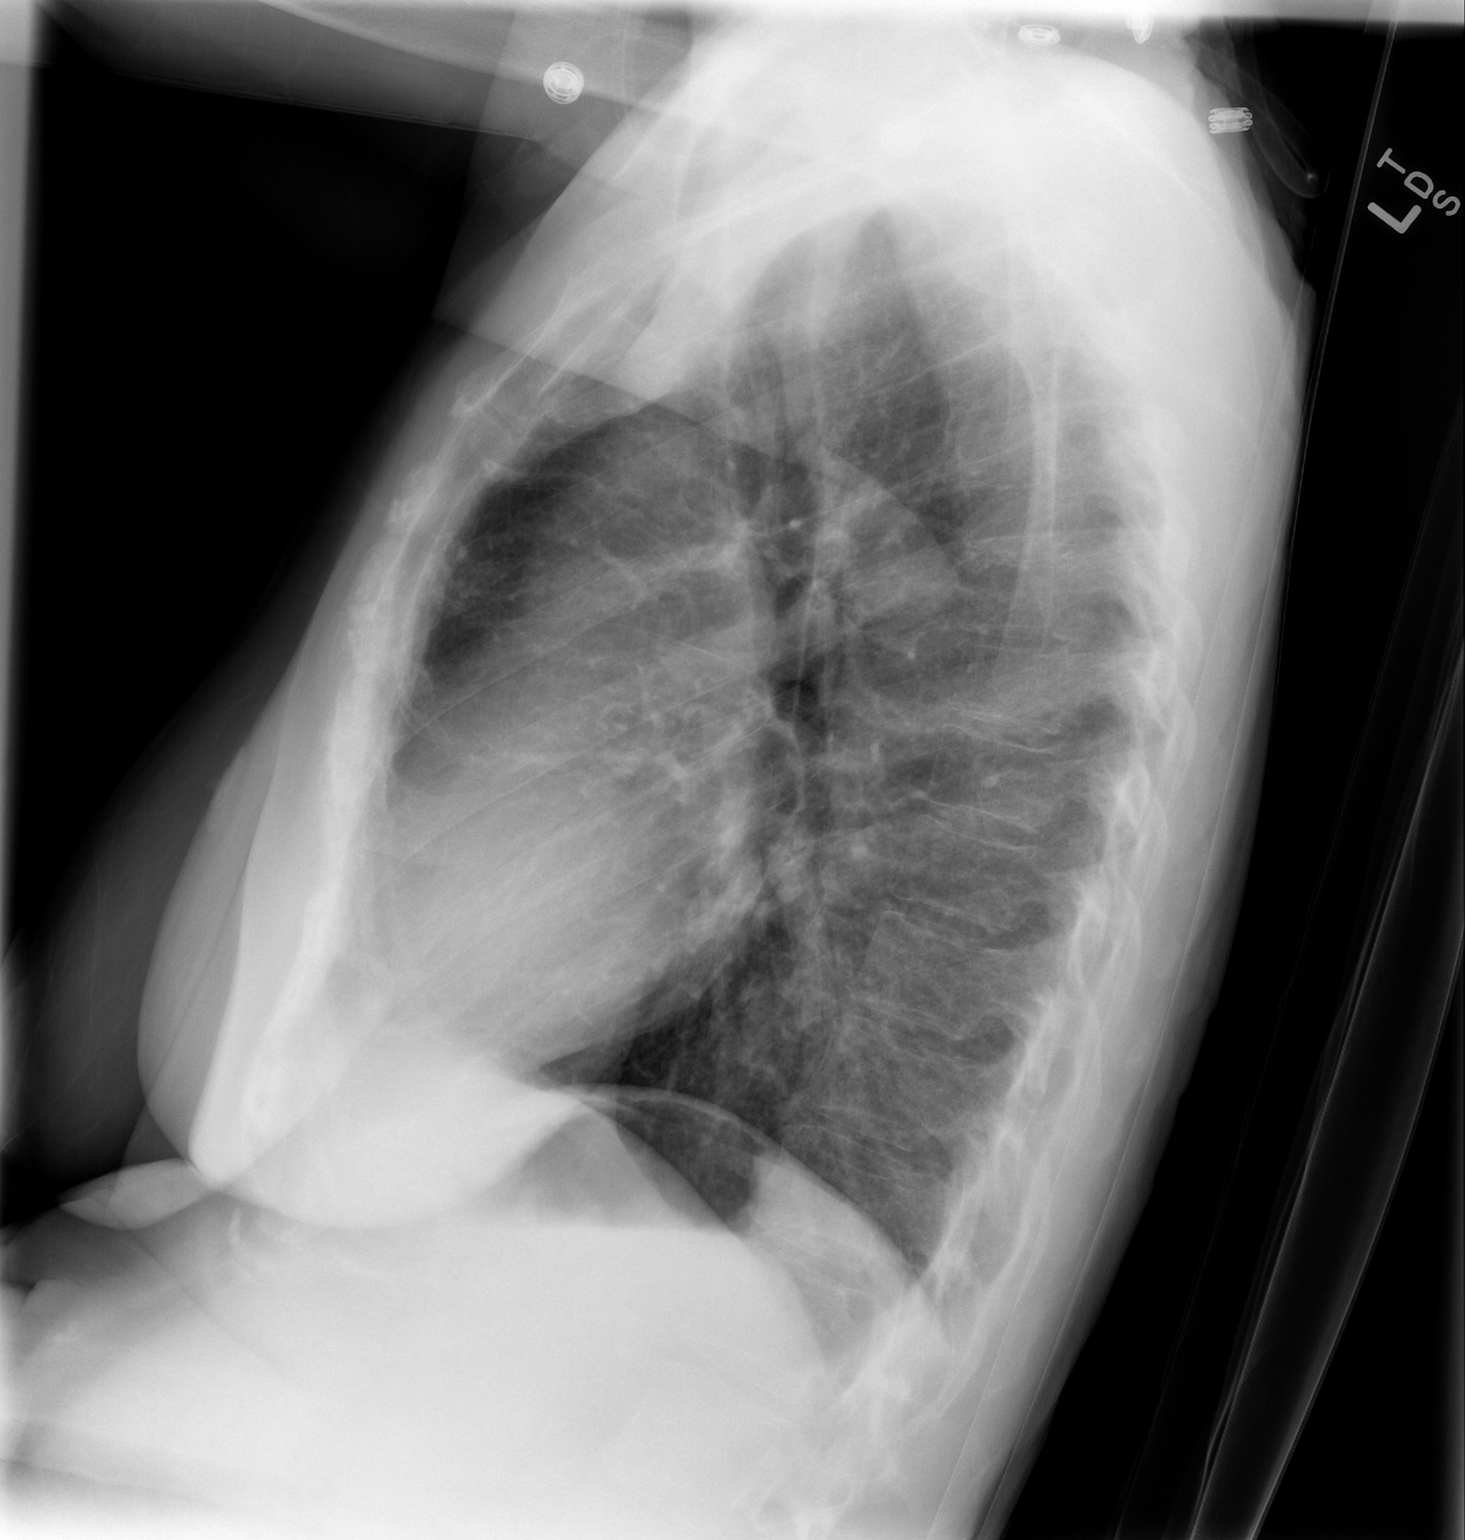

[2 of 2 positions shown; findings below may reference images not displayed]

FINDINGS: Cardiomediastinal silhouette is stable.  Mild
hyperinflation again noted.  Bony thorax is stable.  No acute
infiltrate or pleural effusion.
IMPRESSION: No active disease.  Stable examination.

## 2013-04-04 ENCOUNTER — Other Ambulatory Visit: Payer: Self-pay | Admitting: Gastroenterology

## 2013-04-04 DIAGNOSIS — R112 Nausea with vomiting, unspecified: Secondary | ICD-10-CM

## 2013-04-04 DIAGNOSIS — R634 Abnormal weight loss: Secondary | ICD-10-CM | POA: Diagnosis not present

## 2013-04-04 DIAGNOSIS — R1011 Right upper quadrant pain: Secondary | ICD-10-CM | POA: Diagnosis not present

## 2013-04-11 ENCOUNTER — Ambulatory Visit
Admission: RE | Admit: 2013-04-11 | Discharge: 2013-04-11 | Disposition: A | Discharge status: Home / Self Care | Payer: Medicare Other | Source: Ambulatory Visit | Attending: Gastroenterology | Admitting: Gastroenterology

## 2013-04-11 DIAGNOSIS — R112 Nausea with vomiting, unspecified: Secondary | ICD-10-CM | POA: Diagnosis not present

## 2013-05-08 ENCOUNTER — Emergency Department (HOSPITAL_COMMUNITY)
Admission: EM | Admit: 2013-05-08 | Discharge: 2013-05-09 | Disposition: A | Discharge status: Home / Self Care | Payer: Medicare Other | Source: Home / Self Care | Attending: Emergency Medicine | Admitting: Emergency Medicine

## 2013-05-08 ENCOUNTER — Encounter (HOSPITAL_COMMUNITY): Payer: Self-pay | Admitting: Family Medicine

## 2013-05-08 ENCOUNTER — Emergency Department (HOSPITAL_COMMUNITY): Payer: Medicare Other

## 2013-05-08 DIAGNOSIS — R1115 Cyclical vomiting syndrome unrelated to migraine: Secondary | ICD-10-CM | POA: Diagnosis not present

## 2013-05-08 DIAGNOSIS — F17200 Nicotine dependence, unspecified, uncomplicated: Secondary | ICD-10-CM | POA: Insufficient documentation

## 2013-05-08 DIAGNOSIS — Z872 Personal history of diseases of the skin and subcutaneous tissue: Secondary | ICD-10-CM | POA: Insufficient documentation

## 2013-05-08 DIAGNOSIS — Z860100 Personal history of colon polyps, unspecified: Secondary | ICD-10-CM | POA: Insufficient documentation

## 2013-05-08 DIAGNOSIS — R1110 Vomiting, unspecified: Secondary | ICD-10-CM | POA: Insufficient documentation

## 2013-05-08 DIAGNOSIS — R10817 Generalized abdominal tenderness: Secondary | ICD-10-CM | POA: Insufficient documentation

## 2013-05-08 DIAGNOSIS — R1013 Epigastric pain: Secondary | ICD-10-CM | POA: Insufficient documentation

## 2013-05-08 DIAGNOSIS — Z79899 Other long term (current) drug therapy: Secondary | ICD-10-CM | POA: Insufficient documentation

## 2013-05-08 DIAGNOSIS — J449 Chronic obstructive pulmonary disease, unspecified: Secondary | ICD-10-CM | POA: Insufficient documentation

## 2013-05-08 DIAGNOSIS — J438 Other emphysema: Secondary | ICD-10-CM | POA: Diagnosis not present

## 2013-05-08 DIAGNOSIS — R141 Gas pain: Secondary | ICD-10-CM | POA: Diagnosis not present

## 2013-05-08 DIAGNOSIS — J4489 Other specified chronic obstructive pulmonary disease: Secondary | ICD-10-CM | POA: Insufficient documentation

## 2013-05-08 LAB — COMPREHENSIVE METABOLIC PANEL
ALT: 6 U/L (ref 0–35)
Albumin: 4.5 g/dL (ref 3.5–5.2)
Alkaline Phosphatase: 92 U/L (ref 39–117)
Calcium: 9.9 mg/dL (ref 8.4–10.5)
GFR calc Af Amer: >90 mL/min (ref >90)
Glucose, Bld: 126 mg/dL — ABNORMAL HIGH (ref 70–99)
Potassium: 4 mEq/L (ref 3.5–5.1)
Sodium: 141 mEq/L (ref 135–145)
Total Protein: 7.8 g/dL (ref 6.0–8.3)

## 2013-05-08 LAB — CBC WITH DIFFERENTIAL/PLATELET
Eosinophils Absolute: 0 10*3/uL (ref 0.0–0.7)
Eosinophils Relative: 0 % (ref 0–5)
HCT: 40.5 % (ref 36.0–46.0)
Lymphocytes Relative: 35 % (ref 12–46)
Lymphs Abs: 2.3 10*3/uL (ref 0.7–4.0)
MCH: 29.7 pg (ref 26.0–34.0)
MCV: 89.2 fL (ref 78.0–100.0)
Monocytes Absolute: 0.3 10*3/uL (ref 0.1–1.0)
Platelets: 264 10*3/uL (ref 150–400)
RBC: 4.54 MIL/uL (ref 3.87–5.11)
RDW: 14 % (ref 11.5–15.5)
WBC: 6.5 10*3/uL (ref 4.0–10.5)

## 2013-05-08 MED ORDER — SODIUM CHLORIDE 0.9 % IV SOLN
1000.0000 mL | Freq: Once | INTRAVENOUS | Status: AC
  Administered 2013-05-08: 1000 mL via INTRAVENOUS

## 2013-05-08 MED ORDER — HYDROMORPHONE HCL PF 1 MG/ML IJ SOLN
1.0000 mg | INTRAMUSCULAR | Status: DC | PRN
  Administered 2013-05-08 – 2013-05-09 (×2): 1 mg via INTRAVENOUS
  Filled 2013-05-08 (×2): qty 1

## 2013-05-08 MED ORDER — SODIUM CHLORIDE 0.9 % IV SOLN: 1000.0000 mL | INTRAVENOUS | Status: DC

## 2013-05-08 MED ORDER — ONDANSETRON HCL 4 MG/2ML IJ SOLN
4.0000 mg | Freq: Once | INTRAMUSCULAR | Status: AC
  Administered 2013-05-08: 4 mg via INTRAVENOUS
  Filled 2013-05-08: qty 2

## 2013-05-08 MED ORDER — ONDANSETRON HCL 4 MG/2ML IJ SOLN
4.0000 mg | Freq: Once | INTRAMUSCULAR | Status: AC
  Administered 2013-05-08: 4 mg via INTRAVENOUS
  Filled 2013-05-08 (×2): qty 2

## 2013-05-08 NOTE — ED Provider Notes (Signed)
CSN: [id number]     Arrival date & time 05/08/13  2159 History   First MD Initiated Contact with Patient 05/08/13 2208     Chief Complaint  Patient presents with  . Abdominal Pain  . Emesis    HPI The patient presents to the emergency room with complaints of severe abdominal pain. She has a history of recurrent episodes of vomiting and abdominal pain. This has occurred off-and-on numerous times over the years. She has seen a gastroenterologist but they have not been able to determine the exact cause of her recurrent episodic symptoms.  This evening she started having episodes of abdominal pain primarily around the epigastric region that was radiating towards her lower abdomen and up towards her chest. She also started having episodes of vomiting. She has had multiple episodes that have not been relieved with Phenergan. She denies any fevers but she has felt chilled. She denies any shortness of breath. She denies any swelling numbness or weakness Past Medical History  Diagnosis Date  . Nausea & vomiting   . COPD (chronic obstructive pulmonary disease)   . Smoker   . PSORIASIS 12/29/2006    Qualifier: Diagnosis of  By: Reche Dixon MD, Onalee Hua    . Hx of colonic polyps   . Asthma    Past Surgical History  Procedure Laterality Date  . Partial hysterectomy    . Tonsillectomy    . Appendectomy    . Colonoscopy w/ polypectomy  02/26/2005  . Upper gastrointestinal endoscopy  03/22/2010, 02/13/2005  . Hernia repair      x 2   Family History  Problem Relation Age of Onset  . Cancer Other   . Coronary artery disease Other   . Cancer Mother   . Hypertension Mother   . Heart disease Mother   . Cancer Father   . Hypertension Father   . Cancer Sister   . Hypertension Sister    History  Substance Use Topics  . Smoking status: Current Some Day Smoker -- 0.25 packs/day    Types: Cigarettes  . Smokeless tobacco: Never Used  . Alcohol Use: No     Comment: Quit alcohol in 1992   OB History   Grav  Para Term Preterm Abortions TAB SAB Ect Mult Living                 Review of Systems  All other systems reviewed and are negative.    Allergies  Review of patient's allergies indicates no known allergies.  Home Medications   Current Outpatient Rx  Name  Route  Sig  Dispense  Refill  . albuterol (PROVENTIL HFA;VENTOLIN HFA) 108 (90 BASE) MCG/ACT inhaler   Inhalation   Inhale 2 puffs into the lungs every 6 (six) hours as needed for shortness of breath.          . ondansetron (ZOFRAN) 4 MG tablet   Oral   Take 1 tablet (4 mg total) by mouth every 8 (eight) hours as needed for nausea.   20 tablet   1   . promethazine (PHENERGAN) 25 MG tablet   Oral   Take 1 tablet (25 mg total) by mouth every 6 (six) hours as needed for nausea.   20 tablet   1   . tiotropium (SPIRIVA) 18 MCG inhalation capsule   Inhalation   Place 18 mcg into inhaler and inhale daily.         . ondansetron (ZOFRAN ODT) 8 MG disintegrating tablet   Oral  Take 1 tablet (8 mg total) by mouth every 8 (eight) hours as needed for nausea.   20 tablet   0    BP 188/86  Pulse 91  Temp(Src) 97.7 F (36.5 C) (Oral)  Resp 20  Ht 5\' 7"  (1.702 m)  Wt 121 lb (54.885 kg)  BMI 18.95 kg/m2  SpO2 100% Physical Exam  Nursing note and vitals reviewed. Constitutional: She appears distressed.  HENT:  Head: Normocephalic and atraumatic.  Right Ear: External ear normal.  Left Ear: External ear normal.  Eyes: Conjunctivae are normal. Right eye exhibits no discharge. Left eye exhibits no discharge. No scleral icterus.  Neck: Neck supple. No tracheal deviation present.  Cardiovascular: Normal rate, regular rhythm and intact distal pulses.   Pulmonary/Chest: Effort normal and breath sounds normal. No stridor. No respiratory distress. She has no wheezes. She has no rales.  Abdominal: Soft. Bowel sounds are normal. She exhibits no distension. There is generalized tenderness. There is guarding. There is no rigidity  and no rebound. A hernia (small easily reducible umbilical hernia) is present.  Musculoskeletal: She exhibits no edema and no tenderness.  Neurological: She is alert. She has normal strength. No sensory deficit. Cranial nerve deficit:  no gross defecits noted. She exhibits normal muscle tone. She displays no seizure activity. Coordination normal.  Skin: Skin is warm and dry. No rash noted. She is not diaphoretic.  Psychiatric: She has a normal mood and affect.    ED Course  Procedures (including critical care time) Labs Review Labs Reviewed  COMPREHENSIVE METABOLIC PANEL - Abnormal; Notable for the following:    Glucose, Bld 126 (*)    All other components within normal limits  LIPASE, BLOOD  CBC WITH DIFFERENTIAL  URINALYSIS, ROUTINE W REFLEX MICROSCOPIC   Imaging Review Dg Abd Acute W/chest  05/08/2013   *RADIOLOGY REPORT*  Clinical Data: Abdominal pain, nausea and vomiting, constipation for 2 days.  ACUTE ABDOMEN SERIES (ABDOMEN 2 VIEW & CHEST 1 VIEW)  Comparison: Chest 10/12/2012.  Small bowel follow-through study 04/11/2013.  Findings: Pulmonary hyperinflation and fibrosis consistent with emphysematous change.  Normal heart size and pulmonary vascularity. No focal airspace consolidation in the lungs.  No blunting of costophrenic angles.  No pneumothorax.  Scattered gas and stool in the colon.  Overall paucity of gas in the abdomen.  No small or large bowel distension.  No free intra- abdominal air.  No abnormal air fluid levels.  No radiopaque stones.  Calcifications projected over the pelvis likely represent injection granulomas.  Degenerative changes in the hips.  IMPRESSION: Emphysematous changes in the lungs.  No evidence of active pulmonary disease.  Nonobstructive bowel gas pattern.   Original Report Authenticated By: Burman Nieves, M.D.    MDM   1. Cyclical vomiting    Pt has had recurrent episodes of abdominal pain and vomiting.  Labs and xrays ar unremarkable here.  Pt has  had prior CT scans without acute findings.  DO not feel repeat imaging is necessary at this time.  Pt is feeling much better after treatment.   Celene Kras, MD 05/09/13 [phone]

## 2013-05-08 NOTE — ED Notes (Signed)
Patient transported to X-ray 

## 2013-05-08 NOTE — ED Notes (Signed)
Patient states that she that she has had abdominal pain all day. Started vomiting around 3pm. Took Phenergan and was unable to keep it down. Actively vomiting during triage.

## 2013-05-09 LAB — URINALYSIS, ROUTINE W REFLEX MICROSCOPIC
Bilirubin Urine: NEGATIVE
Glucose, UA: NEGATIVE mg/dL
Hgb urine dipstick: NEGATIVE
Protein, ur: NEGATIVE mg/dL
Urobilinogen, UA: 1 mg/dL (ref 0.0–1.0)

## 2013-05-09 MED ORDER — ONDANSETRON 8 MG PO TBDP: 8.0000 mg | ORAL_TABLET | Freq: Three times a day (TID) | ORAL | Status: DC | PRN

## 2013-05-10 ENCOUNTER — Encounter (HOSPITAL_COMMUNITY): Payer: Self-pay

## 2013-05-10 ENCOUNTER — Inpatient Hospital Stay (HOSPITAL_COMMUNITY)
Admission: EM | Admit: 2013-05-10 | Discharge: 2013-05-12 | DRG: 392 | Disposition: A | Discharge status: Home / Self Care | Payer: Medicare Other | Attending: Family Medicine | Admitting: Family Medicine

## 2013-05-10 ENCOUNTER — Other Ambulatory Visit: Payer: Self-pay

## 2013-05-10 ENCOUNTER — Ambulatory Visit
Admission: RE | Admit: 2013-05-10 | Discharge: 2013-05-10 | Disposition: A | Discharge status: Home / Self Care | Payer: Medicare Other | Source: Ambulatory Visit

## 2013-05-10 DIAGNOSIS — K219 Gastro-esophageal reflux disease without esophagitis: Secondary | ICD-10-CM | POA: Diagnosis present

## 2013-05-10 DIAGNOSIS — J449 Chronic obstructive pulmonary disease, unspecified: Secondary | ICD-10-CM | POA: Diagnosis present

## 2013-05-10 DIAGNOSIS — R1110 Vomiting, unspecified: Secondary | ICD-10-CM | POA: Diagnosis present

## 2013-05-10 DIAGNOSIS — F329 Major depressive disorder, single episode, unspecified: Secondary | ICD-10-CM | POA: Diagnosis not present

## 2013-05-10 DIAGNOSIS — E860 Dehydration: Secondary | ICD-10-CM

## 2013-05-10 DIAGNOSIS — Z1231 Encounter for screening mammogram for malignant neoplasm of breast: Secondary | ICD-10-CM

## 2013-05-10 DIAGNOSIS — L408 Other psoriasis: Secondary | ICD-10-CM | POA: Diagnosis present

## 2013-05-10 DIAGNOSIS — J4489 Other specified chronic obstructive pulmonary disease: Secondary | ICD-10-CM | POA: Diagnosis present

## 2013-05-10 DIAGNOSIS — E876 Hypokalemia: Secondary | ICD-10-CM | POA: Diagnosis present

## 2013-05-10 DIAGNOSIS — E43 Unspecified severe protein-calorie malnutrition: Secondary | ICD-10-CM | POA: Insufficient documentation

## 2013-05-10 DIAGNOSIS — Z800 Family history of malignant neoplasm of digestive organs: Secondary | ICD-10-CM | POA: Diagnosis not present

## 2013-05-10 DIAGNOSIS — R112 Nausea with vomiting, unspecified: Secondary | ICD-10-CM | POA: Diagnosis not present

## 2013-05-10 DIAGNOSIS — Z86010 Personal history of colonic polyps: Secondary | ICD-10-CM | POA: Diagnosis not present

## 2013-05-10 DIAGNOSIS — F3289 Other specified depressive episodes: Secondary | ICD-10-CM | POA: Diagnosis present

## 2013-05-10 DIAGNOSIS — F1210 Cannabis abuse, uncomplicated: Secondary | ICD-10-CM | POA: Diagnosis present

## 2013-05-10 DIAGNOSIS — R1115 Cyclical vomiting syndrome unrelated to migraine: Secondary | ICD-10-CM | POA: Diagnosis not present

## 2013-05-10 DIAGNOSIS — E46 Unspecified protein-calorie malnutrition: Secondary | ICD-10-CM | POA: Insufficient documentation

## 2013-05-10 DIAGNOSIS — F17200 Nicotine dependence, unspecified, uncomplicated: Secondary | ICD-10-CM | POA: Diagnosis present

## 2013-05-10 LAB — POCT I-STAT, CHEM 8
BUN: 9 mg/dL (ref 6–23)
Calcium, Ion: 1.11 mmol/L — ABNORMAL LOW (ref 1.13–1.30)
Creatinine, Ser: 1 mg/dL (ref 0.50–1.10)
Glucose, Bld: 96 mg/dL (ref 70–99)
Hemoglobin: 17 g/dL — ABNORMAL HIGH (ref 12.0–15.0)
Sodium: 141 mEq/L (ref 135–145)
TCO2: 21 mmol/L (ref 0–100)

## 2013-05-10 MED ORDER — POTASSIUM CHLORIDE IN NACL 20-0.9 MEQ/L-% IV SOLN
Freq: Once | INTRAVENOUS | Status: AC
  Administered 2013-05-10: 23:00:00 via INTRAVENOUS
  Filled 2013-05-10: qty 1000

## 2013-05-10 MED ORDER — PROMETHAZINE HCL 25 MG/ML IJ SOLN
25.0000 mg | Freq: Once | INTRAMUSCULAR | Status: AC
  Administered 2013-05-10: 25 mg via INTRAVENOUS
  Filled 2013-05-10: qty 1

## 2013-05-10 MED ORDER — ONDANSETRON 8 MG/NS 50 ML IVPB
8.0000 mg | Freq: Once | INTRAVENOUS | Status: AC
  Administered 2013-05-10: 8 mg via INTRAVENOUS
  Filled 2013-05-10: qty 8

## 2013-05-10 MED ORDER — HYDROMORPHONE HCL PF 1 MG/ML IJ SOLN
0.5000 mg | Freq: Once | INTRAMUSCULAR | Status: AC
  Administered 2013-05-10: 0.5 mg via INTRAVENOUS
  Filled 2013-05-10: qty 1

## 2013-05-10 MED ORDER — ONDANSETRON HCL 4 MG/2ML IJ SOLN
4.0000 mg | Freq: Three times a day (TID) | INTRAMUSCULAR | Status: AC | PRN
  Administered 2013-05-11: 4 mg via INTRAVENOUS
  Filled 2013-05-10: qty 2

## 2013-05-10 MED ORDER — SODIUM CHLORIDE 0.9 % IV SOLN
Freq: Once | INTRAVENOUS | Status: AC
  Administered 2013-05-10: 20:00:00 via INTRAVENOUS

## 2013-05-10 MED ORDER — SODIUM CHLORIDE 0.9 % IV SOLN: INTRAVENOUS | Status: DC

## 2013-05-10 MED ORDER — HYDROMORPHONE HCL PF 1 MG/ML IJ SOLN
1.0000 mg | INTRAMUSCULAR | Status: AC | PRN
  Administered 2013-05-11 (×3): 1 mg via INTRAVENOUS
  Filled 2013-05-10 (×3): qty 1

## 2013-05-10 NOTE — ED Notes (Addendum)
Pt c/o lower abdominal pain, emesis and diarrhea x 3 days.  Pain score 10/10.  Pt was seen at [facility] for same complaint x 3 days ago and was seen at GI MDs office today.  Sts she can not keep down the prescribed medication.

## 2013-05-10 NOTE — ED Provider Notes (Signed)
CSN: [id number]     Arrival date & time 05/10/13  1834 History   First MD Initiated Contact with Patient 05/10/13 1857     Chief Complaint  Patient presents with  . Abdominal Pain  . Emesis  . Diarrhea   HPI Pt is a 62 y/o female w/ PMHx for COPD, Intractable N/V followed by Dr. Madilyn Fireman at [facility] who presents with continued episodes of periumbilical abdominal pain, nausea, and vomiting.  Pt was seen in the ED on 9/7 for the same complaint, labwork at that time was normal at that time, improved with IVF and Zofran, and sent home.  Since that point, she has continued to have nausea, several episodes of non bilious/non bloody vomitus, and a few episodes of non bloody diarrhea.  She f/u with her GI doctor today who plans on performing colonoscopy as well as gastric emptying study next week.  Denies CP, SOB, fever, worsening chills, sweats, numbness, tingling.  Has continued to try phenergan for her nausea/vomiting as well as Zofran ODT w/ minimal relief.   Past Medical History  Diagnosis Date  . Nausea & vomiting   . COPD (chronic obstructive pulmonary disease)   . Smoker   . PSORIASIS 12/29/2006    Qualifier: Diagnosis of  By: Reche Dixon MD, Onalee Hua    . Hx of colonic polyps   . Asthma    Past Surgical History  Procedure Laterality Date  . Partial hysterectomy    . Tonsillectomy    . Appendectomy    . Colonoscopy w/ polypectomy  02/26/2005  . Upper gastrointestinal endoscopy  03/22/2010, 02/13/2005  . Hernia repair      x 2   Family History  Problem Relation Age of Onset  . Cancer Other   . Coronary artery disease Other   . Cancer Mother   . Hypertension Mother   . Heart disease Mother   . Cancer Father   . Hypertension Father   . Cancer Sister   . Hypertension Sister    History  Substance Use Topics  . Smoking status: Current Some Day Smoker -- 0.25 packs/day    Types: Cigarettes  . Smokeless tobacco: Never Used  . Alcohol Use: No     Comment: Quit alcohol in 1992   OB History    Grav Para Term Preterm Abortions TAB SAB Ect Mult Living                 Review of Systems  Allergies  Review of patient's allergies indicates no known allergies.  Home Medications   Current Outpatient Rx  Name  Route  Sig  Dispense  Refill  . albuterol (PROVENTIL HFA;VENTOLIN HFA) 108 (90 BASE) MCG/ACT inhaler   Inhalation   Inhale 2 puffs into the lungs every 6 (six) hours as needed for shortness of breath.          . ondansetron (ZOFRAN ODT) 8 MG disintegrating tablet   Oral   Take 1 tablet (8 mg total) by mouth every 8 (eight) hours as needed for nausea.   20 tablet   0   . ondansetron (ZOFRAN) 4 MG tablet   Oral   Take 1 tablet (4 mg total) by mouth every 8 (eight) hours as needed for nausea.   20 tablet   1   . promethazine (PHENERGAN) 25 MG tablet   Oral   Take 1 tablet (25 mg total) by mouth every 6 (six) hours as needed for nausea.   20 tablet  1   . tiotropium (SPIRIVA) 18 MCG inhalation capsule   Inhalation   Place 18 mcg into inhaler and inhale daily.          BP 192/95  Pulse 84  Temp(Src) 97.9 F (36.6 C) (Oral)  Resp 20  SpO2 100% Physical Exam Nursing note and vitals reviewed.  Constitutional: She appears distressed.  HENT:  Head: Normocephalic and atraumatic.  Right Ear: External ear normal.  Left Ear: External ear normal.  Eyes: Conjunctivae are normal. Right eye exhibits no discharge. Left eye exhibits no discharge. No scleral icterus.  Neck: Neck supple. No tracheal deviation present.  Cardiovascular: Normal rate, regular rhythm and intact distal pulses.  Pulmonary/Chest: Effort normal and breath sounds normal. No stridor. No respiratory distress. She has no wheezes. She has no rales.  Abdominal: Soft. Bowel sounds are normal. She exhibits no distension. There is generalized tenderness. There is no guarding. There is no rigidity and no rebound. A hernia (small easily reducible umbilical hernia) is present.  Musculoskeletal: She  exhibits no edema and no tenderness.  Neurological: She is alert. She has normal strength. No sensory deficit. Cranial nerve deficit: no gross defecits noted. She exhibits normal muscle tone. She displays no seizure activity. Coordination normal.  Skin: Skin is warm and dry. No rash noted. She is not diaphoretic.  Psychiatric: She has a normal mood and affect.    ED Course  Procedures (including critical care time) Labs Review Labs Reviewed  POCT I-STAT, CHEM 8 - Abnormal; Notable for the following:    Potassium 3.2 (*)    Calcium, Ion 1.11 (*)    Hemoglobin 17.0 (*)    HCT 50.0 (*)    All other components within normal limits   Imaging Review Dg Abd Acute W/chest  05/08/2013   *RADIOLOGY REPORT*  Clinical Data: Abdominal pain, nausea and vomiting, constipation for 2 days.  ACUTE ABDOMEN SERIES (ABDOMEN 2 VIEW & CHEST 1 VIEW)  Comparison: Chest 10/12/2012.  Small bowel follow-through study 04/11/2013.  Findings: Pulmonary hyperinflation and fibrosis consistent with emphysematous change.  Normal heart size and pulmonary vascularity. No focal airspace consolidation in the lungs.  No blunting of costophrenic angles.  No pneumothorax.  Scattered gas and stool in the colon.  Overall paucity of gas in the abdomen.  No small or large bowel distension.  No free intra- abdominal air.  No abnormal air fluid levels.  No radiopaque stones.  Calcifications projected over the pelvis likely represent injection granulomas.  Degenerative changes in the hips.  IMPRESSION: Emphysematous changes in the lungs.  No evidence of active pulmonary disease.  Nonobstructive bowel gas pattern.   Original Report Authenticated By: Burman Nieves, M.D.    MDM   1. Cyclical vomiting   2. Nausea & vomiting   Pt w/ recurrent intractable N/V that is continuing to be worked up by Dr. Madilyn Fireman, [facility].  Will get Istat Chem 8, IVF bolus, and Zofran IV at this time.  Do not see further need for repeat imaging at this time.    10:19 PM - Pt's istat showing K+ 3.2, will give another 1 L IVF bolus w/ 20 meq KCl.  Phenergan for nausea/vomiting at this time.    11:28 PM - Pt continuing to have nausea and vomiting despite phenergan and one dose of dilaudid.  Spoke with FPTS, on call physician, Dr. Waynetta Sandy, who will admit pt for overnight observation.    Twana First Paulina Fusi, DO of [facility] 05/10/2013, 11:28 PM  Briscoe Deutscher, DO 05/10/13 2330

## 2013-05-10 NOTE — ED Notes (Signed)
MD at bedside. 

## 2013-05-11 DIAGNOSIS — F329 Major depressive disorder, single episode, unspecified: Secondary | ICD-10-CM | POA: Diagnosis not present

## 2013-05-11 DIAGNOSIS — J449 Chronic obstructive pulmonary disease, unspecified: Secondary | ICD-10-CM | POA: Diagnosis not present

## 2013-05-11 DIAGNOSIS — E860 Dehydration: Secondary | ICD-10-CM | POA: Diagnosis not present

## 2013-05-11 DIAGNOSIS — E46 Unspecified protein-calorie malnutrition: Secondary | ICD-10-CM | POA: Insufficient documentation

## 2013-05-11 DIAGNOSIS — E43 Unspecified severe protein-calorie malnutrition: Secondary | ICD-10-CM | POA: Insufficient documentation

## 2013-05-11 DIAGNOSIS — R1115 Cyclical vomiting syndrome unrelated to migraine: Secondary | ICD-10-CM | POA: Diagnosis not present

## 2013-05-11 LAB — COMPREHENSIVE METABOLIC PANEL
ALT: 5 U/L (ref 0–35)
AST: 13 U/L (ref 0–37)
Albumin: 3.6 g/dL (ref 3.5–5.2)
Alkaline Phosphatase: 70 U/L (ref 39–117)
BUN: 7 mg/dL (ref 6–23)
Chloride: 102 mEq/L (ref 96–112)
Potassium: 3.7 mEq/L (ref 3.5–5.1)
Total Bilirubin: 0.3 mg/dL (ref 0.3–1.2)

## 2013-05-11 LAB — CBC
HCT: 36.5 % (ref 36.0–46.0)
RDW: 13.9 % (ref 11.5–15.5)
WBC: 6.9 10*3/uL (ref 4.0–10.5)

## 2013-05-11 LAB — LIPASE, BLOOD: Lipase: 27 U/L (ref 11–59)

## 2013-05-11 MED ORDER — BOOST / RESOURCE BREEZE PO LIQD
1.0000 | Freq: Every day | ORAL | Status: DC
  Administered 2013-05-11: 1 via ORAL

## 2013-05-11 MED ORDER — TIOTROPIUM BROMIDE MONOHYDRATE 18 MCG IN CAPS
18.0000 ug | ORAL_CAPSULE | Freq: Every day | RESPIRATORY_TRACT | Status: DC
  Filled 2013-05-11 (×4): qty 5

## 2013-05-11 MED ORDER — ONDANSETRON HCL 4 MG PO TABS
8.0000 mg | ORAL_TABLET | Freq: Three times a day (TID) | ORAL | Status: DC | PRN
  Administered 2013-05-11: 8 mg via ORAL
  Filled 2013-05-11 (×2): qty 2

## 2013-05-11 MED ORDER — KCL IN DEXTROSE-NACL 20-5-0.45 MEQ/L-%-% IV SOLN
INTRAVENOUS | Status: DC
  Administered 2013-05-11 (×2): via INTRAVENOUS
  Filled 2013-05-11 (×4): qty 1000

## 2013-05-11 MED ORDER — INFLUENZA VAC SPLIT QUAD 0.5 ML IM SUSP
0.5000 mL | INTRAMUSCULAR | Status: AC
  Filled 2013-05-11 (×2): qty 0.5

## 2013-05-11 MED ORDER — HYDROMORPHONE HCL PF 1 MG/ML IJ SOLN: 1.0000 mg | INTRAMUSCULAR | Status: DC | PRN

## 2013-05-11 MED ORDER — ALBUTEROL SULFATE HFA 108 (90 BASE) MCG/ACT IN AERS
2.0000 | INHALATION_SPRAY | Freq: Four times a day (QID) | RESPIRATORY_TRACT | Status: DC | PRN
  Filled 2013-05-11: qty 6.7

## 2013-05-11 MED ORDER — INFLUENZA VAC SPLIT QUAD 0.5 ML IM SUSP: 0.5000 mL | INTRAMUSCULAR | Status: DC

## 2013-05-11 MED ORDER — SODIUM CHLORIDE 0.9 % IJ SOLN
3.0000 mL | Freq: Two times a day (BID) | INTRAMUSCULAR | Status: DC
  Administered 2013-05-11: 3 mL via INTRAVENOUS

## 2013-05-11 MED ORDER — HEPARIN SODIUM (PORCINE) 5000 UNIT/ML IJ SOLN
5000.0000 [IU] | Freq: Three times a day (TID) | INTRAMUSCULAR | Status: DC
  Administered 2013-05-11 – 2013-05-12 (×4): 5000 [IU] via SUBCUTANEOUS
  Filled 2013-05-11 (×8): qty 1

## 2013-05-11 MED ORDER — TRAMADOL HCL 50 MG PO TABS
50.0000 mg | ORAL_TABLET | Freq: Four times a day (QID) | ORAL | Status: DC | PRN
  Administered 2013-05-11 – 2013-05-12 (×2): 50 mg via ORAL
  Filled 2013-05-11 (×2): qty 1

## 2013-05-11 NOTE — H&P (Signed)
Family Medicine Teaching [facility] Admission History and Physical Service Pager: [phone]  Patient name: Dalani Mette Medical record number: [id number] Date of birth: Apr 25, 1951 Age: 62 y.o. Gender: female  Primary Care Provider: Marikay Alar, MD Consultants: none Code Status: Full  Chief Complaint: nausea, vomiting, diarrhea  Assessment and Plan: Vonceil Herrada is a 62 y.o. female presenting with intractable nausea/vomiting/diarrhea worsened over the past 2 days. PMH is significant for COPD, GERD, cyclical vomiting, psoriasis, tobacco/marijuana abuse.  # Nausea/vomiting/diarrhea: long history of n/v but worsened over past 2 days. Presented to ED on 9/7 and again to [facility] tonight. i-stat chem 8 showed only low potassium, Hgb 17.0 (likely dehydration). UA was normal.  - Admit to observation, attending Dr. Leveda Anna - Continue IV hydration: D5 1/2NS + KCl 125cc/hr - Continue zofran, phenergan - Dilaudid 1mg  injection PRN - AM labs: CBC, CMET, lipase  # Chest pain: likely due to vomiting episodes - Cycle troponins x3  # COPD: - continue home albuterol PRN. - continue home Spiriva  FEN/GI: D5 1/2 NS + KCl 125cc/hr, NPO Prophylaxis: heparin sq  Disposition: admit to observation  History of Present Illness: Sinthia Renninger is a 62 y.o. female presenting with intractable nausea/vomiting/diarrhea. She has a long history of vomiting 1-2 times per day, but starting Sunday it has gotten much worse and actually went to the ED at that time where she improved with fluids and zofran. Her vomiting has continued, says it is non-bloody and non-bilious, and also has had non-bloody diarrhea with most recent episode at 7pm earlier this evening. Last vomiting episode was just before coming upstairs to [facility]. She also complains of some "pressure" in her chest, left arm, jaw when she vomits, but does not currently have this pain. She was feeling lightheaded earlier but this has resolved. She  denies any fever/chills, SOB. She does report significant weight loss over the past year, says she used to weigh 200lbs, but is now 121lbs (earliest weight in chart is 144lbs in 10/2011).  She saw Dr. Madilyn Fireman [facility]) today and they are planning on getting a colonoscopy and gastric emptying study next week.   Review Of Systems: Per HPI with the following additions: none Otherwise 12 point review of systems was performed and was unremarkable.  Patient Active Problem List   Diagnosis Date Noted  . Abdominal pain, epigastric 02/23/2013  . Cyclical vomiting 07/16/2012  . UTI (lower urinary tract infection) 11/12/2011  . Nausea & vomiting   . COPD (chronic obstructive pulmonary disease)   . Smoker   . BRONCHITIS, CHRONIC 12/29/2006  . COPD 12/29/2006  . GASTROESOPHAGEAL REFLUX DISEASE 12/29/2006  . HIATAL HERNIA WITH REFLUX 12/29/2006  . PSORIASIS 12/29/2006   Past Medical History: Past Medical History  Diagnosis Date  . Nausea & vomiting   . COPD (chronic obstructive pulmonary disease)   . Smoker   . PSORIASIS 12/29/2006    Qualifier: Diagnosis of  By: Reche Dixon MD, Onalee Hua    . Hx of colonic polyps   . Asthma    Past Surgical History: Past Surgical History  Procedure Laterality Date  . Partial hysterectomy    . Tonsillectomy    . Appendectomy    . Colonoscopy w/ polypectomy  02/26/2005  . Upper gastrointestinal endoscopy  03/22/2010, 02/13/2005  . Hernia repair      x 2   Social History: History  Substance Use Topics  . Smoking status: Current Some Day Smoker -- 0.25 packs/day    Types: Cigarettes  .  Smokeless tobacco: Never Used  . Alcohol Use: No     Comment: Quit alcohol in 1992   Additional social history: smokes marijuana daily to help with nausea/vomiting  Please also refer to relevant sections of EMR.  Family History: Family History  Problem Relation Age of Onset  . Cancer Other   . Coronary artery disease Other   . Cancer Mother   . Hypertension Mother   .  Heart disease Mother   . Cancer Father   . Hypertension Father   . Cancer Sister   . Hypertension Sister    Allergies and Medications: No Known Allergies No current facility-administered medications on file prior to encounter.   Current Outpatient Prescriptions on File Prior to Encounter  Medication Sig Dispense Refill  . albuterol (PROVENTIL HFA;VENTOLIN HFA) 108 (90 BASE) MCG/ACT inhaler Inhale 2 puffs into the lungs every 6 (six) hours as needed for shortness of breath.       . ondansetron (ZOFRAN ODT) 8 MG disintegrating tablet Take 1 tablet (8 mg total) by mouth every 8 (eight) hours as needed for nausea.  20 tablet  0  . tiotropium (SPIRIVA) 18 MCG inhalation capsule Place 18 mcg into inhaler and inhale daily.        Objective: BP 142/77  Pulse 74  Temp(Src) 98.9 F (37.2 C) (Oral)  Resp 18  Ht 5' 6.5" (1.689 m)  Wt 121 lb (54.885 kg)  BMI 19.24 kg/m2  SpO2 100% Exam: General: NAD, laying in bed, appears cachectic, dry heaves during exam HEENT: sclera anicteric, PERRL, EOMI. Oral mucosa dry, dental caries noted and tongue coat with thick mucous Cardiovascular: RRR, normal heart sounds, no m/r/g Respiratory: CTAB, effort normal Abdomen: soft, nontender to palpation, bowel sounds present Extremities: no edema in lower legs, no tenderness. 2+ PT pulses bilaterally. Skin: warm and dry, psoriatic lesions Neuro: alert and oriented.   Labs and Imaging: CBC BMET   Recent Labs Lab 05/08/13 2240 05/10/13 2018  WBC 6.5  --   HGB 13.5 17.0*  HCT 40.5 50.0*  PLT 264  --     Recent Labs Lab 05/08/13 2240 05/10/13 2018  NA 141 141  K 4.0 3.2*  CL 103 101  CO2 25  --   BUN 11 9  CREATININE 0.74 1.00  GLUCOSE 126* 96  CALCIUM 9.9  --       Tawni Carnes, MD 05/11/2013, 2:29 AM PGY-1, [place] FPTS Intern pager: [phone], text pages welcome   R2 Addendum:  I have seen the above patient and have discussed the patient's presentation, history,  objective data, physical exam and assessment and plan with Dr. Waynetta Sandy.  Briefly, this patient is a 62 y.o. year old female who presenting with acute worsening of her chronic intractable nausea vomiting and diarrhea.  She's been having this for over one year.  She is followed by Dr. Madilyn Fireman at [facility] for this.  He was planning on doing a outpatient colonoscopy gastric imaging study next week given the persistence of her symptoms.  However she had acute worsening and is unable to take any fluids by mouth.  She was seen in the emergency department 2 days ago and discharged home with anti-emetics.  She persistently has had difficulty with vomiting and returned due to dehydration.   On exam she is dry with retching.  Small-volume, nonbloody, non-bilious emesis noted in emesis bag.  She reports she is significantly more comfortable after pain medication.  She does report some chest  tightness and because of this we will cycle cardiac enzymes although this seems to be associated more so with her retching and vomiting.  Given the unclear etiology of her diagnosis we will check a repeat serum lipase in the morning.  She does continue to smoke marijuana on a regular basis and this has been discouraged.  Of note she does report losing a significant amount of weight over the past couple of years reportedly weighing more than 200 pounds.  She is now 121 pounds.  It appears as she has a documented 23# loss over the past 1.5years.  Of note she does still have appendix and gallbladder.  Exam is nonfocal for biliary dysfunction.  She does have a significant prior abdominal surgery history there is no evidence on exam were plain film imaging to suggest a bowel obstruction.  Consider an NG tube if she continues to have symptoms not relieved with pain medications anti-emetics.  Known history of COPD with evidence on her chest x-ray.  Continue her home medications.  She also has a history of psoriasis with evidence on  exam.  Will defer treatment for this at this time.  Andrena Mews, DO [facility] Resident - PGY-3 05/11/2013 7:00 AM

## 2013-05-11 NOTE — H&P (Signed)
Seen and examined.  Discussed with Dr. Berline Chough.  Agree with his documentation and management.  Briefly 62 yo female with 2 year hx of vomiting, poor PO intake and a cumulative 90 lb weight loss.  Issues 1. Dehydration on admit 2nd to vomiting 2. Chronic vomiting - most likely cyclic vomiting syndrome.  She is a marijuana smoker (~2 times per week) and a cigarette smoker (<1/2 ppd.)  Stopping would help with vomiting and COPD.  Will contact GI to see if wants further diagnostic studies this admit.  Therapeutically, phenergan helps.  I would add mirtazipine - see depression below. 3. COPD with mild exacerbation.  I don't think she needs antibiotics or steroids at this point.  Does need bronchodilators and DC smoking.  She should be on controler - inhaler steroid or anticholinergic as outpatient.  Was on spiriva for a while, unclear to me why stopped. 4. Depression: Multiple important life events.  Mother died 01-12-2023 and still grieving.  Son incarcerated shortly after mother's death and facing perhaps 7 years in prison.  Daughter is getting married next week.  Not sleeping, No HI or SI.  Crying.  Needs antidepressant.  Mirtazipine might be good choice with sedation and appetite stimulation.

## 2013-05-11 NOTE — Progress Notes (Signed)
Patient c/o nausea and abdominal pain after eating regular diet. For dinner. M.D. Notified.

## 2013-05-11 NOTE — ED Provider Notes (Signed)
I saw and evaluated the patient, reviewed the resident's note and I agree with the findings and plan.   .Face to face Exam:  General:  Awake HEENT:  Atraumatic Resp:  Normal effort Abd:  Nondistended Neuro:No focal weakness  Machelle Raybon L Kalep Full, MD 05/11/13 1458 

## 2013-05-11 NOTE — Progress Notes (Signed)
INITIAL NUTRITION ASSESSMENT  DOCUMENTATION CODES Per approved criteria  -Severe malnutrition in the context of chronic illness   INTERVENTION:  Resource Breeze daily (250 kcals, 9 gm protein per 8 fl oz carton) RD to follow for nutrition care plan  NUTRITION DIAGNOSIS: Inadequate oral intake related to poor appetite as evidenced by PO intake 0-25%  Goal: Pt to meet >/= 90% of their estimated nutrition needs   Monitor:  PO & supplemental intake, weight, labs, I/O's  Reason for Assessment: Malnutrition Screening Tool Report  62 y.o. female  Admitting Dx: nausea, vomiting and diarrhea  ASSESSMENT: Patient with PMH of COPD, GERD, cyclical vomiting, psoriasis and tobacco/marijuana abuse; presented with intractable nausea/vomiting/diarrhea.  Patient reports her appetite is poor; PO intake 0-25% per flowsheet records; she reports > 90 lb weight loss over the past year; per weight readings, patient has had a 13% weight loss since February 2014; she has visible muscle & fat loss to upper body; amenable to trying Resource supplement ---> RD to order.  Patient meets criteria for severe malnutrition in the context of chronic illness as evidenced by severe muscle loss (temples, clavicles, acromion bone), severe subcutaneous fat loss (upper arm region) and 13% weight loss x 7 months.  Height: Ht Readings from Last 1 Encounters:  05/11/13 5' 6.5" (1.689 m)    Weight: Wt Readings from Last 1 Encounters:  05/11/13 121 lb (54.885 kg)    Ideal Body Weight: 130 lb  % Ideal Body Weight: 93%  Wt Readings from Last 10 Encounters:  05/11/13 121 lb (54.885 kg)  05/08/13 121 lb (54.885 kg)  02/23/13 135 lb (61.236 kg)  02/23/13 121 lb (54.885 kg)  10/12/12 140 lb (63.504 kg)  08/03/12 140 lb (63.504 kg)  07/16/12 141 lb (63.957 kg)  11/15/11 144 lb 10 oz (65.6 kg)    Usual Body Weight: 140 lb (February 2014)  % Usual Body Weight: 86%  BMI:  Body mass index is 19.24  kg/(m^2).  Estimated Nutritional Needs: Kcal: 1600-1800 Protein: 80-90 gm Fluid: 1.6-1.8 L  Skin: Intact  Diet Order: General  EDUCATION NEEDS: -No education needs identified at this time   Intake/Output Summary (Last 24 hours) at 05/11/13 1446 Last data filed at 05/11/13 1300  Gross per 24 hour  Intake    123 ml  Output      0 ml  Net    123 ml    Labs:   Recent Labs Lab 05/08/13 2240 05/10/13 2018 05/11/13 0440  NA 141 141 138  K 4.0 3.2* 3.7  CL 103 101 102  CO2 25  --  25  BUN 11 9 7   CREATININE 0.74 1.00 0.69  CALCIUM 9.9  --  9.0  GLUCOSE 126* 96 129*    Scheduled Meds: . heparin  5,000 Units Subcutaneous Q8H  . influenza vac split quadrivalent PF  0.5 mL Intramuscular Tomorrow-1000  . sodium chloride  3 mL Intravenous Q12H  . tiotropium  18 mcg Inhalation Daily    Continuous Infusions: . dextrose 5 % and 0.45 % NaCl with KCl 20 mEq/L 20 mL/hr (05/11/13 1434)    Past Medical History  Diagnosis Date  . Nausea & vomiting   . COPD (chronic obstructive pulmonary disease)   . Smoker   . PSORIASIS 12/29/2006    Qualifier: Diagnosis of  By: Reche Dixon MD, Onalee Hua    . Hx of colonic polyps   . Asthma     Past Surgical History  Procedure Laterality Date  . Partial  hysterectomy    . Tonsillectomy    . Appendectomy    . Colonoscopy w/ polypectomy  02/26/2005  . Upper gastrointestinal endoscopy  03/22/2010, 02/13/2005  . Hernia repair      x 2    Maureen Chatters, RD, LDN Pager #: [phone] After-Hours Pager #: [phone]

## 2013-05-12 MED ORDER — PROMETHAZINE HCL 25 MG/ML IJ SOLN
12.5000 mg | Freq: Four times a day (QID) | INTRAMUSCULAR | Status: DC | PRN
  Administered 2013-05-12 (×2): 12.5 mg via INTRAVENOUS
  Filled 2013-05-12 (×2): qty 1

## 2013-05-12 MED ORDER — PROMETHAZINE HCL 25 MG PO TABS: 25.0000 mg | ORAL_TABLET | Freq: Four times a day (QID) | ORAL | Status: DC | PRN

## 2013-05-12 MED ORDER — PROMETHAZINE HCL 25 MG RE SUPP: 25.0000 mg | Freq: Four times a day (QID) | RECTAL | Status: DC | PRN

## 2013-05-12 MED ORDER — INFLUENZA VAC SPLIT QUAD 0.5 ML IM SUSP
0.5000 mL | Freq: Once | INTRAMUSCULAR | Status: AC
  Administered 2013-05-12: 0.5 mL via INTRAMUSCULAR

## 2013-05-12 NOTE — Progress Notes (Signed)
Family Medicine Teaching Service Daily Progress Note Intern Pager: [phone]  Patient name: [name] Medical record number: [id number] Date of birth: Jan 10, 1951 Age: 62 y.o. Gender: female  Primary Care Provider: Marikay Alar, MD Consultants: none Code Status: Full  Pt Overview and Major Events to Date:   Assessment and Plan: [name] is a 62 y.o. female presenting with intractable nausea/vomiting/diarrhea worsened over the past 2 days. PMH is significant for COPD, GERD, cyclical vomiting, psoriasis, tobacco/marijuana abuse.   # Nausea/vomiting/diarrhea: long history of n/v but worsened over past 2 days. Presented to ED on 9/7 and again to [facility] tonight. i-stat chem 8 showed only low potassium, UA normal.  - Tolerating po intake yesterday and this morning - Continue zofran, phenergan  - CMET wnl, CBC wnl, lipase wnl   # Chest pain: likely due to vomiting episodes  - troponins neg x 3  # COPD:  - continue home albuterol PRN.  - continue home Spiriva   FEN/GI: KVO, regular diet  Prophylaxis: heparin sq  Disposition: discharge to home today  Subjective:  Tolerating some food/liquids today. Continues to have nausea and vomited 2 times yesterday, which she says is normal for her. Last BM on Monday. She says she feels good enough to go home today.  Objective: Temp:  [97.8 F (36.6 C)-99 F (37.2 C)] 97.8 F (36.6 C) (09/11 0544) Pulse Rate:  [66-72] 66 (09/11 0544) Resp:  [18-20] 18 (09/11 0544) BP: (133-171)/(76-82) 171/81 mmHg (09/11 0544) SpO2:  [100 %] 100 % (09/11 0544) Physical Exam: General: NAD, sitting upright in bed. Cardiovascular: RRR, normal heart sounds, no murmurs Respiratory: initial wheezes in LLL that resolved with a few deep inspirations, otherwise CTAB throughout lung fields Abdomen: soft, bowel sounds present, mild tenderness over umbilical hernia (reducible) Extremities: no edema in legs  Laboratory:  Recent Labs Lab 05/08/13 2240  05/10/13 2018 05/11/13 0440  WBC 6.5  --  6.9  HGB 13.5 17.0* 12.5  HCT 40.5 50.0* 36.5  PLT 264  --  241    Recent Labs Lab 05/08/13 2240 05/10/13 2018 05/11/13 0440  NA 141 141 138  K 4.0 3.2* 3.7  CL 103 101 102  CO2 25  --  25  BUN 11 9 7   CREATININE 0.74 1.00 0.69  CALCIUM 9.9  --  9.0  PROT 7.8  --  6.7  BILITOT 0.3  --  0.3  ALKPHOS 92  --  70  ALT 6  --  5  AST 17  --  13  GLUCOSE 126* 96 129*   Troponins negative x 3  Imaging/Diagnostic Tests:   Tawni Carnes, MD 05/12/2013, 7:21 AM PGY-1,  Family Medicine FPTS Intern pager: [phone], text pages welcome

## 2013-05-12 NOTE — Progress Notes (Signed)
I have seen and examined this patient. I have discussed with Dr Wight.  I agree with their findings and plans as documented in their progress note.    

## 2013-05-12 NOTE — Discharge Summary (Signed)
Family Medicine Teaching [facility] Discharge Summary  Patient name: Geralda Baumgardner Medical record number: [id number] Date of birth: Jul 21, 1951 Age: 62 y.o. Gender: female Date of Admission: 05/10/2013  Date of Discharge: 05/12/2013 Admitting Physician: Sanjuana Letters, MD  Primary Care Provider: Marikay Alar, MD Consultants: none  Indication for Hospitalization: intractable nausea/vomiting  Discharge Diagnoses/Problem List:  Tobacco abuse Marijuana abuse COPD GERD Psoriasis  Disposition: discharge to home  Discharge Condition: stable  Brief Hospital Course:  Enaya Ruttan is a 62 y.o. female that was admitted for intractable nausea/vomiting and chest pain. PMHx is significant for long history of daily vomiting, GERD, COPD, psoriasis, tobacco/marijuana abuse. She was hydrated with IV fluids, given zofran and phenergan until she tolerated oral intake.  1. Nausea/vomiting: patient has at least a 5 year history of daily vomiting with previous workup and GI follow up. Pain was managed with dilaudid 1mg  PRN and hydration status with IV Fluids. Initial i-STAT chem 8 showed hypokalemia which was repleted. CBC, repeat CMET, and lipase were all normal. Patient vomiting episodes decreased in frequency to 1-2 per day which she says is her normal, began to tolerate oral intake, and was felt to be stable for discharge. She was counseled that her marijuana use can be a likely trigger for her continued vomiting. 2. Chest pain: musculoskeletal in nature, likely due to forceful vomiting/wretching. Troponins cycled and negative x 3.  3. COPD: continued on home spiriva and albuterol PRN. Oxygen saturation 97-100% on room air throughout hospital course  Issues for Follow Up:  1. Nausea/vomiting: has colonoscopy and gastric emptying study scheduled for next week  Significant Procedures: none  Significant Labs and Imaging:   Recent Labs Lab 05/08/13 2240 05/10/13 2018 05/11/13 0440  WBC  6.5  --  6.9  HGB 13.5 17.0* 12.5  HCT 40.5 50.0* 36.5  PLT 264  --  241    Recent Labs Lab 05/08/13 2240 05/10/13 2018 05/11/13 0440  NA 141 141 138  K 4.0 3.2* 3.7  CL 103 101 102  CO2 25  --  25  GLUCOSE 126* 96 129*  BUN 11 9 7   CREATININE 0.74 1.00 0.69  CALCIUM 9.9  --  9.0  ALKPHOS 92  --  70  AST 17  --  13  ALT 6  --  5  ALBUMIN 4.5  --  3.6   Troponin negative x 3 Lipase 27  Results/Tests Pending at Time of Discharge: none  Discharge Medications:    Medication List         acetaminophen 500 MG tablet  Commonly known as:  TYLENOL  Take 1,000 mg by mouth every 6 (six) hours as needed for pain.     albuterol 108 (90 BASE) MCG/ACT inhaler  Commonly known as:  PROVENTIL HFA;VENTOLIN HFA  Inhale 2 puffs into the lungs every 6 (six) hours as needed for shortness of breath.     ondansetron 8 MG disintegrating tablet  Commonly known as:  ZOFRAN ODT  Take 1 tablet (8 mg total) by mouth every 8 (eight) hours as needed for nausea.     promethazine 25 MG tablet  Commonly known as:  PHENERGAN  Take 1 tablet (25 mg total) by mouth every 6 (six) hours as needed for nausea.     tiotropium 18 MCG inhalation capsule  Commonly known as:  SPIRIVA  Place 18 mcg into inhaler and inhale daily.        Discharge Instructions: Please refer to Patient Instructions section of  EMR for full details.  Patient was counseled important signs and symptoms that should prompt return to medical care, changes in medications, dietary instructions, activity restrictions, and follow up appointments.   Follow-Up Appointments:     Follow-up Information   Follow up with Barrie Folk, MD On 05/17/2013. (Keep regular appointment for colonoscopy and gastric emptying study)    Specialty:  Gastroenterology   Contact information:   [address]                         [place] [address] [phone]       Follow up with [facility] On  05/23/2013. (at 2:30 with Dr. Antony Haste)    Specialty:  Family Medicine   Contact information:   [address] [id number] [place] [address] [phone]      Tawni Carnes, MD 05/12/2013, 11:24 AM PGY-1, [facility] Family Medicine

## 2013-05-12 NOTE — Progress Notes (Signed)
Pt discharged to home. Discharge instructions reviewed with patient, patient verbalized understanding. Not in acute distress. Belongings sent with pt.

## 2013-05-14 NOTE — Discharge Summary (Signed)
I discussed with  Dr Waynetta Sandy.  I agree with their plans documented in their Discharge note for today.

## 2013-05-18 ENCOUNTER — Other Ambulatory Visit: Payer: Self-pay | Admitting: Gastroenterology

## 2013-05-18 DIAGNOSIS — D126 Benign neoplasm of colon, unspecified: Secondary | ICD-10-CM | POA: Diagnosis not present

## 2013-05-18 DIAGNOSIS — Z86010 Personal history of colonic polyps: Secondary | ICD-10-CM | POA: Diagnosis not present

## 2013-05-18 DIAGNOSIS — Z800 Family history of malignant neoplasm of digestive organs: Secondary | ICD-10-CM | POA: Diagnosis not present

## 2013-05-18 DIAGNOSIS — K620 Anal polyp: Secondary | ICD-10-CM | POA: Diagnosis not present

## 2013-05-18 DIAGNOSIS — K259 Gastric ulcer, unspecified as acute or chronic, without hemorrhage or perforation: Secondary | ICD-10-CM | POA: Diagnosis not present

## 2013-05-18 DIAGNOSIS — D128 Benign neoplasm of rectum: Secondary | ICD-10-CM | POA: Diagnosis not present

## 2013-05-18 DIAGNOSIS — K319 Disease of stomach and duodenum, unspecified: Secondary | ICD-10-CM | POA: Diagnosis not present

## 2013-05-18 DIAGNOSIS — Z09 Encounter for follow-up examination after completed treatment for conditions other than malignant neoplasm: Secondary | ICD-10-CM | POA: Diagnosis not present

## 2013-05-18 DIAGNOSIS — A048 Other specified bacterial intestinal infections: Secondary | ICD-10-CM | POA: Diagnosis not present

## 2013-05-23 ENCOUNTER — Ambulatory Visit: Payer: Medicare Other | Admitting: Family Medicine

## 2013-06-13 ENCOUNTER — Telehealth: Payer: Self-pay | Admitting: Family Medicine

## 2013-06-13 DIAGNOSIS — R112 Nausea with vomiting, unspecified: Secondary | ICD-10-CM | POA: Diagnosis not present

## 2013-06-13 DIAGNOSIS — K259 Gastric ulcer, unspecified as acute or chronic, without hemorrhage or perforation: Secondary | ICD-10-CM | POA: Diagnosis not present

## 2013-06-13 NOTE — Telephone Encounter (Signed)
Pt need Dr. Birdie Sons to send a prescription for cough medicine to the pharmacy.

## 2013-06-14 NOTE — Telephone Encounter (Signed)
Left message for pt to call back.  She needs an appt to have cough evaluated before medication can be called in.  Please schedule with crosscover or same day.  Thanks [employer]

## 2013-07-06 ENCOUNTER — Encounter: Payer: Self-pay | Admitting: Family Medicine

## 2013-07-06 ENCOUNTER — Ambulatory Visit (INDEPENDENT_AMBULATORY_CARE_PROVIDER_SITE_OTHER): Payer: Medicare Other | Admitting: Family Medicine

## 2013-07-06 VITALS — BP 142/82 | HR 80 | Temp 98.1°F | Ht 66.5 in | Wt 129.0 lb

## 2013-07-06 DIAGNOSIS — R059 Cough, unspecified: Secondary | ICD-10-CM | POA: Diagnosis not present

## 2013-07-06 DIAGNOSIS — R05 Cough: Secondary | ICD-10-CM | POA: Diagnosis not present

## 2013-07-06 MED ORDER — ALBUTEROL SULFATE HFA 108 (90 BASE) MCG/ACT IN AERS: 2.0000 | INHALATION_SPRAY | Freq: Four times a day (QID) | RESPIRATORY_TRACT | Status: DC | PRN

## 2013-07-06 MED ORDER — GUAIFENESIN-DM 100-10 MG/5ML PO SYRP: 5.0000 mL | ORAL_SOLUTION | ORAL | Status: DC | PRN

## 2013-07-06 MED ORDER — PREDNISONE 20 MG PO TABS: 40.0000 mg | ORAL_TABLET | Freq: Every day | ORAL | Status: DC

## 2013-07-06 NOTE — Patient Instructions (Addendum)
Nice to see you. Your cough sounds as though you have a bronchitis. We will treat this with your albuterol inhaler. Please use this 2 puffs every 6 hours for the next 2 days and then as needed every 6 hours.  Please also take the prednisone each day for the next 5 days. You can use the cough syrup for your cough.   Bronchitis Bronchitis is a problem of the air tubes leading to your lungs. This problem makes it hard for air to get in and out of the lungs. You may cough a lot because your air tubes are narrow. Going without care can cause lasting (chronic) bronchitis. HOME CARE   Drink enough fluids to keep your pee (urine) clear or pale yellow.  Use a cool mist humidifier.  Quit smoking if you smoke. If you keep smoking, the bronchitis might not get better.  Only take medicine as told by your doctor. GET HELP RIGHT AWAY IF:   Coughing keeps you awake.  You start to wheeze.  You become more sick or weak.  You have a hard time breathing or get short of breath.  You cough up blood.  Coughing lasts more than 2 weeks.  You have a fever.  Your baby is older than 3 months with a rectal temperature of 102 F (38.9 C) or higher.  Your baby is 44 months old or younger with a rectal temperature of 100.4 F (38 C) or higher. MAKE SURE YOU:  Understand these instructions.  Will watch your condition.  Will get help right away if you are not doing well or get worse. Document Released: 02/04/2008 Document Revised: 11/10/2011 Document Reviewed: 04/12/2013 [facility] Patient Information 2014 [place], [place].

## 2013-07-06 NOTE — Progress Notes (Signed)
Patient ID: [name], female   DOB: Mar 28, 1951, 62 y.o.   MRN: [id number] [name] is a 62 y.o. female who presents today for cough x1 month.  Patient notes she has had a cold for the past month of gradual onset. She notes productive cough of green sputum. Sore throat, post nasal drip, and mild congestion. She denies fevers. She has tried robitussin, vicks, and similasan for her cough with minimal benefit. She notes using her albuterol inhaler helps with her cough. She denies shortness of breath with this. States she just can't get rid of this cough.   Past Medical History  Diagnosis Date  . Nausea & vomiting   . COPD (chronic obstructive pulmonary disease)   . Smoker   . PSORIASIS 12/29/2006    Qualifier: Diagnosis of  By: Reche Dixon MD, Onalee Hua    . Hx of colonic polyps   . Asthma     History  Smoking status  . Current Every Day Smoker -- 0.25 packs/day  . Types: Cigarettes  Smokeless tobacco  . Never Used    Family History  Problem Relation Age of Onset  . Cancer Other   . Coronary artery disease Other   . Cancer Mother   . Hypertension Mother   . Heart disease Mother   . Cancer Father   . Hypertension Father   . Cancer Sister   . Hypertension Sister     Current Outpatient Prescriptions on File Prior to Visit  Medication Sig Dispense Refill  . acetaminophen (TYLENOL) 500 MG tablet Take 1,000 mg by mouth every 6 (six) hours as needed for pain.      Marland Kitchen ondansetron (ZOFRAN ODT) 8 MG disintegrating tablet Take 1 tablet (8 mg total) by mouth every 8 (eight) hours as needed for nausea.  20 tablet  0  . promethazine (PHENERGAN) 25 MG tablet Take 1 tablet (25 mg total) by mouth every 6 (six) hours as needed for nausea.  30 tablet  0  . tiotropium (SPIRIVA) 18 MCG inhalation capsule Place 18 mcg into inhaler and inhale daily.       No current facility-administered medications on file prior to visit.    ROS: Per HPI   Physical Exam Filed Vitals:   07/06/13 1125  BP:  142/82  Pulse: 80  Temp: 98.1 F (36.7 C)  O2 sat 98% RA  Physical Examination: General appearance - alert, well appearing, and in no distress Eyes - sclera anicteric Ears - bilateral TM's and external ear canals normal Mouth - erythematous and no exudate noted Neck - supple, no significant adenopathy Chest - patient with referred upper airway sounds, expiratory wheezes noted in upper lung fields and anteriorly bilaterally, no crackles noted Heart - normal rate, regular rhythm, normal S1, S2, no murmurs, rubs, clicks or gallops   Assessment/Plan: Please see individual problem list.

## 2013-07-08 NOTE — Assessment & Plan Note (Addendum)
Patient with cough over the past month. Patient with history of COPD. She is oxygenating well and does not appear in respiratory distress. Does not have a fever. Given wheezing and improvement with albuterol I suspect this is an acute viral bronchitis vs mild COPD exacerbation. Will treat with albuterol inhaler to be used every 6 hours for the next 2 days. She will be given a prednisone 5 day course. Given robitussin DM for cough. Given return precautions in AVS. Will see her back in one week for follow-up or sooner as needed.

## 2013-07-13 ENCOUNTER — Encounter: Payer: Self-pay | Admitting: Family Medicine

## 2013-07-13 ENCOUNTER — Ambulatory Visit (INDEPENDENT_AMBULATORY_CARE_PROVIDER_SITE_OTHER): Payer: Medicare Other | Admitting: Family Medicine

## 2013-07-13 ENCOUNTER — Other Ambulatory Visit: Payer: Self-pay | Admitting: Family Medicine

## 2013-07-13 VITALS — BP 166/84 | HR 71 | Temp 98.2°F | Ht 66.5 in | Wt 135.0 lb

## 2013-07-13 DIAGNOSIS — J441 Chronic obstructive pulmonary disease with (acute) exacerbation: Secondary | ICD-10-CM | POA: Diagnosis not present

## 2013-07-13 DIAGNOSIS — J4489 Other specified chronic obstructive pulmonary disease: Secondary | ICD-10-CM

## 2013-07-13 DIAGNOSIS — R030 Elevated blood-pressure reading, without diagnosis of hypertension: Secondary | ICD-10-CM

## 2013-07-13 DIAGNOSIS — J449 Chronic obstructive pulmonary disease, unspecified: Secondary | ICD-10-CM

## 2013-07-13 MED ORDER — GUAIFENESIN-CODEINE 100-10 MG/5ML PO SYRP: 5.0000 mL | ORAL_SOLUTION | Freq: Three times a day (TID) | ORAL | Status: DC | PRN

## 2013-07-13 MED ORDER — SULFAMETHOXAZOLE-TMP DS 800-160 MG PO TABS: 1.0000 | ORAL_TABLET | Freq: Two times a day (BID) | ORAL | Status: DC

## 2013-07-13 MED ORDER — PREDNISONE 20 MG PO TABS: 20.0000 mg | ORAL_TABLET | Freq: Every day | ORAL | Status: DC

## 2013-07-13 NOTE — Patient Instructions (Signed)
Prednisone: 40mg  daily for 5 days, then 20mg  daily for 3days Bactrim: 1 tab twice a day for 5 days  GO TO ED for worsening symptoms  Chronic Obstructive Pulmonary Disease Chronic obstructive pulmonary disease (COPD) is a condition in which airflow from the lungs is restricted. The lungs can never return to normal, but there are measures you can take which will improve them and make you feel better. CAUSES   Smoking.  Exposure to secondhand smoke.  Breathing in irritants such as air pollution, dust, cigarette smoke, strong odors, aerosol sprays, or paint fumes.  History of lung infections. SYMPTOMS   Deep, persistent (chronic) cough with a large amount of thick mucus.  Wheezing.  Shortness of breath, especially with physical activity.  Feeling like you cannot get enough air.  Difficulty breathing.  Rapid breaths (tachypnea).  Gray or bluish discoloration (cyanosis) of the skin, especially in fingers, toes, or lips.  Fatigue.  Weight loss.  Swelling in legs, ankles, or feet.  Fast heartbeat (tachycardia).  Frequent lung infections.   Chest tightness. DIAGNOSIS  Initial diagnosis may be based on your history, symptoms, and physical examination. Additional tests for COPD may include:  Chest X-ray.  Computed tomography (CT) scan.  Lung (pulmonary) function tests.  Blood tests. TREATMENT  Treatment focuses on making you comfortable (supportive care). Your caregiver may prescribe medicines (inhaled or pills) to help improve your breathing. Additional treatment options may include oxygen therapy and pulmonary rehabilitation. Treatment should also include reducing your exposure to known irritants and following a plan to stop smoking. HOME CARE INSTRUCTIONS   Take all medicines, including antibiotic medicines, as directed by your caregiver.  Use inhaled medicines as directed by your caregiver.  Avoid medicines or cough syrups that dry up your airway (antihistamines)  and slow down the elimination of secretions. This decreases respiratory capacity and may lead to infections.  If you smoke, stop smoking.  Avoid exposure to smoke, chemicals, and fumes that aggravate your breathing.  Avoid contact with individuals that have a contagious illness.  Avoid extreme temperature and humidity changes.  Use humidifiers at home and at your bedside if they do not make breathing difficult.  Drink enough water and fluids to keep your urine clear or pale yellow. This loosens secretions.  Eat healthy foods. Eating smaller, more frequent meals and resting before meals may help you maintain your strength.  Ask your caregiver about the use of vitamins and mineral supplements.  Stay active. Exercise and physical activity will help maintain your ability to do things you want to do.  Balance activity with periods of rest.  Assume a position of comfort if you become short of breath.  Learn and use relaxation techniques.  Learn and use controlled breathing techniques as directed by your caregiver. Controlled breathing techniques include:  Pursed lip breathing. This breathing technique starts with breathing in (inhaling) through your nose for 1 second. Next, purse your lips as if you were going to whistle. Then breathe out (exhale) through the pursed lips for 2 seconds.  Diaphragmatic breathing. Start by putting one hand on your abdomen just above your waist. Inhale slowly through your nose. The hand on your abdomen should move out. Then exhale slowly through pursed lips. You should be able to feel the hand on your abdomen moving in as you exhale.  Learn and use controlled coughing to clear mucus from your lungs. Controlled coughing is a series of short, progressive coughs. The steps of controlled coughing are: 1. Lean your  head slightly forward. 2. Breathe in deeply using diaphragmatic breathing. 3. Try to hold your breath for 3 seconds. 4. Keep your mouth slightly open  while coughing twice. 5. Spit any mucus out into a tissue. 6. Rest and repeat the steps once or twice as needed.  Receive all protective vaccines your caregiver suggests, especially pneumococcal and influenza vaccines.  Learn to manage stress.  Schedule and attend all follow-up appointments as directed by your caregiver. It is important to keep all your appointments.  Participate in pulmonary rehabilitation as directed by your caregiver.  Use home oxygen as suggested. SEEK MEDICAL CARE IF:   You are coughing up more mucus than usual.  There is a change in the color or thickness of the mucus.  Breathing is more labored than usual.  Your breathing is faster than usual.  Your skin color is more cyanotic than usual.  You are running out of the medicine you take for your breathing.  You are anxious, apprehensive, or restless.  You have a fever. SEEK IMMEDIATE MEDICAL CARE IF:   You have a rapid heart rate.  You have shortness of breath while you are resting.  You have shortness of breath that prevents you from being able to talk.  You have shortness of breath that prevents you from performing your usual physical activities.  You have chest pain lasting longer than 5 minutes.  You have a seizure.  Your family or friends notice that you are agitated or confused. MAKE SURE YOU:   Understand these instructions.  Will watch your condition.  Will get help right away if you are not doing well or get worse. Document Released: 05/28/2005 Document Revised: 05/12/2012 Document Reviewed: 04/14/2013 [facility] Patient Information 2014 [place], [place].

## 2013-07-13 NOTE — Assessment & Plan Note (Signed)
COPD Exacerbation: Will give 5d of bactrim, redose with steroids with short taper. 40x5, 20x3. Continue albuterol. ED for worsening symptoms. Reevaluate in 1 week and if not improving may consider CXR at that time.

## 2013-07-13 NOTE — Progress Notes (Signed)
Subjective:     Patient ID: [name], female   DOB: 1951/03/23, 62 y.o.   MRN: [id number]  HPI 62 y.o. F w/ hx of COPD. Pt was seen 11/5 and started on albuterol and prednisone. Prednisone 40mg  qday finished 11/10. Pt has been taking cough syrup with no significant change. Pt reports productive cough green then turns to foam. signficant increase from baseline.  Pt having difficulty taking spiriva.   Pt currently using albuterol 2-3x/day. Pt has had to sit outside at night.  Review of Systems No fevers/chills, +SOB/Cough productive, +nausea.     Objective:   Physical Exam Filed Vitals:   07/13/13 1348  BP: 169/96  Pulse: 71  Temp: 98.2 F (36.8 C)   Repeat BP 166/84 Elevated BP, reviewed prior. Hx of elevated. NAD, persistent deep hacking cough Pt with rhonchorus breath sounds, no wheezing on exam today RRR no mgt     Assessment:     62 y.o. F here for reevaluation of COPD Exacerbation    Plan:     COPD Exacerbation: Will give 5d of bactrim, redose with steroids with short taper. 40x5, 20x3. Continue albuterol. ED for worsening symptoms. Reevaluate in 1 week and if not improving may consider CXR at that time.  Elevated BP: Improved on repeat. Likely related to current illness. Reeval next week. If persistently elevated consider starting ACEi vs diuretic.

## 2013-07-13 NOTE — Assessment & Plan Note (Signed)
Elevated BP: Improved on repeat. Likely related to current illness. Reeval next week. If persistently elevated consider starting ACEi vs diuretic.

## 2013-07-14 NOTE — Telephone Encounter (Signed)
Will refill. If patient is continuing to need her inhaler despite the course of steroids provided to her, she needs to come back in to be seen. Please inform the patient.

## 2013-07-15 NOTE — Telephone Encounter (Signed)
Has appt next week. [name], [name]

## 2013-07-20 ENCOUNTER — Ambulatory Visit (INDEPENDENT_AMBULATORY_CARE_PROVIDER_SITE_OTHER): Payer: Medicare Other | Admitting: Family Medicine

## 2013-07-20 ENCOUNTER — Encounter: Payer: Self-pay | Admitting: Family Medicine

## 2013-07-20 VITALS — BP 130/78 | HR 72 | Temp 98.1°F | Ht 66.5 in | Wt 132.0 lb

## 2013-07-20 DIAGNOSIS — J449 Chronic obstructive pulmonary disease, unspecified: Secondary | ICD-10-CM

## 2013-07-20 DIAGNOSIS — R112 Nausea with vomiting, unspecified: Secondary | ICD-10-CM | POA: Diagnosis not present

## 2013-07-20 MED ORDER — TIOTROPIUM BROMIDE MONOHYDRATE 18 MCG IN CAPS: 18.0000 ug | ORAL_CAPSULE | Freq: Every day | RESPIRATORY_TRACT | Status: DC

## 2013-07-20 MED ORDER — PROMETHAZINE HCL 25 MG PO TABS: 25.0000 mg | ORAL_TABLET | Freq: Four times a day (QID) | ORAL | Status: DC | PRN

## 2013-07-20 NOTE — Patient Instructions (Signed)
Nice to see you. I'm glad you are feeling better. Please use your spiriva every day. This is a controller medication that should help prevent any worsening of your COPD. Use your albuterol inhaler as needed for shortness of breath.  I will see you back in 1-2 weeks to check on your breathing. If it worsens please come back sooner. I have sent in a refill on your phenergan. We will request the most recent records from the GI doctor. Once I get these we will call with a further plan.  GET HELP RIGHT AWAY IF:   You can feel your heart beating really fast.  You have shortness of breath while resting.  You have shortness of breath that stops you from being able to talk.  You have shortness of breath that stops you from doing normal activities.  You have chest pain lasting longer than 5 minutes.  You start to shake uncontrollably (seizure).  Your family or friends notice that you are flustered or confused.  You cough up more thick spit than usual.  There is a change in the color or thickness of the spit.  Breathing is more difficult than usual.  Your breathing is faster than usual.  Your skin color is more blueish than usual.  You are running out of the medicine you take for breathing.  You are anxious, uneasy, fearful, or restless.  You have a fever.

## 2013-07-20 NOTE — Progress Notes (Signed)
Patient ID: [name], female   DOB: 1950/12/07, 62 y.o.   MRN: [id number]  Marikay Alar, MD Phone: [phone]  [name] is a 62 y.o. female who presents today for f/u of cough and shortness of breath related to COPD exacerbation and for f/u nausea.  COPD exacerbation: patient states she is much improved. She is breathing better and her cough is improved. She notes she is still taking her albuterol 2-3x/day because it helps her breath better. She did not realized that her spiriva is her daily medication and the albuterol is her as needed medication.  Nausea: patient recently treated for H pylori infection by GI. States her nausea improved during this treatment, though shortly following stopping this treatment the patient had return of nausea, though not as bad as previously. States her phenergan helps with this. She notes vomiting once per day. She denies abdominal pain with this. States she is eating only once a day, though is gaining weight.   Past Medical History  Diagnosis Date  . Nausea & vomiting   . COPD (chronic obstructive pulmonary disease)   . Smoker   . PSORIASIS 12/29/2006    Qualifier: Diagnosis of  By: Reche Dixon MD, Onalee Hua    . Hx of colonic polyps   . Asthma     History  Smoking status  . Current Some Day Smoker -- 0.25 packs/day  . Types: Cigarettes  Smokeless tobacco  . Never Used    Family History  Problem Relation Age of Onset  . Cancer Other   . Coronary artery disease Other   . Cancer Mother   . Hypertension Mother   . Heart disease Mother   . Cancer Father   . Hypertension Father   . Cancer Sister   . Hypertension Sister     Current Outpatient Prescriptions on File Prior to Visit  Medication Sig Dispense Refill  . acetaminophen (TYLENOL) 500 MG tablet Take 1,000 mg by mouth every 6 (six) hours as needed for pain.      Marland Kitchen albuterol (PROAIR HFA) 108 (90 BASE) MCG/ACT inhaler Inhale 2 puffs into the lungs every 6 (six) hours as needed for  wheezing or shortness of breath. For the next 2 days please use every 6 hours, after that please use every 6 hours as needed.  1 Inhaler  1  . albuterol (PROAIR HFA) 108 (90 BASE) MCG/ACT inhaler Inhale 2 puffs into the lungs every 6 (six) hours as needed for wheezing or shortness of breath.  1 Inhaler  1  . guaiFENesin-codeine (ROBITUSSIN AC) 100-10 MG/5ML syrup Take 5 mLs by mouth 3 (three) times daily as needed for cough.  120 mL  0  . guaiFENesin-dextromethorphan (ROBITUSSIN DM) 100-10 MG/5ML syrup Take 5 mLs by mouth every 4 (four) hours as needed for cough.  118 mL  0  . ondansetron (ZOFRAN ODT) 8 MG disintegrating tablet Take 1 tablet (8 mg total) by mouth every 8 (eight) hours as needed for nausea.  20 tablet  0  . predniSONE (DELTASONE) 20 MG tablet Take 2 tablets (40 mg total) by mouth daily with breakfast.  10 tablet  0  . predniSONE (DELTASONE) 20 MG tablet Take 1 tablet (20 mg total) by mouth daily with breakfast.  13 tablet  0  . sulfamethoxazole-trimethoprim (BACTRIM DS) 800-160 MG per tablet Take 1 tablet by mouth 2 (two) times daily.  10 tablet  0   No current facility-administered medications on file prior to visit.    ROS: Per HPI  Physical Exam Filed Vitals:   07/20/13 1034  BP: 130/78  Pulse: 72  Temp: 98.1 F (36.7 C)    Physical Examination: General appearance - alert, well appearing, and in no distress Chest - referred upper airway sounds, no noted wheezes or crackles, no increased work of breathing Heart - normal rate, regular rhythm, normal S1, S2, no murmurs, rubs, clicks or gallops Abdomen - soft, nontender, nondistended, no masses or organomegaly   Assessment/Plan: Please see individual problem list.

## 2013-07-22 NOTE — Assessment & Plan Note (Signed)
Continues to have issues with this chronic problem. Noted it improved following treatment for H pylori, though once antibiotics were finished the nausea returned. Will have patient return for h pylori breath test to confirm eradication of infection. Phenergan refilled.

## 2013-07-22 NOTE — Assessment & Plan Note (Addendum)
COPD exacerbation is improved following course of bactrim and repeat steroids. Will continue to follow. Reinforced spiriva as controller medication and albuterol as rescue inhaler. Discussed return precautions. F/u in 1-2 weeks.

## 2013-08-10 ENCOUNTER — Encounter: Payer: Self-pay | Admitting: Family Medicine

## 2013-08-10 ENCOUNTER — Ambulatory Visit (HOSPITAL_COMMUNITY)
Admission: RE | Admit: 2013-08-10 | Discharge: 2013-08-10 | Disposition: A | Discharge status: Home / Self Care | Payer: Medicare Other | Source: Ambulatory Visit | Attending: Family Medicine | Admitting: Family Medicine

## 2013-08-10 ENCOUNTER — Ambulatory Visit (INDEPENDENT_AMBULATORY_CARE_PROVIDER_SITE_OTHER): Payer: Medicare Other | Admitting: Family Medicine

## 2013-08-10 VITALS — BP 152/88 | HR 72 | Temp 98.3°F | Wt 133.0 lb

## 2013-08-10 DIAGNOSIS — R059 Cough, unspecified: Secondary | ICD-10-CM | POA: Diagnosis not present

## 2013-08-10 DIAGNOSIS — R0600 Dyspnea, unspecified: Secondary | ICD-10-CM | POA: Insufficient documentation

## 2013-08-10 DIAGNOSIS — R0609 Other forms of dyspnea: Secondary | ICD-10-CM | POA: Insufficient documentation

## 2013-08-10 DIAGNOSIS — R0602 Shortness of breath: Secondary | ICD-10-CM | POA: Diagnosis not present

## 2013-08-10 DIAGNOSIS — J4489 Other specified chronic obstructive pulmonary disease: Secondary | ICD-10-CM | POA: Insufficient documentation

## 2013-08-10 DIAGNOSIS — R0989 Other specified symptoms and signs involving the circulatory and respiratory systems: Secondary | ICD-10-CM | POA: Insufficient documentation

## 2013-08-10 DIAGNOSIS — J441 Chronic obstructive pulmonary disease with (acute) exacerbation: Secondary | ICD-10-CM | POA: Diagnosis not present

## 2013-08-10 DIAGNOSIS — R05 Cough: Secondary | ICD-10-CM | POA: Insufficient documentation

## 2013-08-10 DIAGNOSIS — J449 Chronic obstructive pulmonary disease, unspecified: Secondary | ICD-10-CM | POA: Diagnosis not present

## 2013-08-10 LAB — CBC
Hemoglobin: 13 g/dL (ref 12.0–15.0)
Platelets: 306 10*3/uL (ref 150–400)
RBC: 4.41 MIL/uL (ref 3.87–5.11)
RDW: 13.8 % (ref 11.5–15.5)
WBC: 7.5 10*3/uL (ref 4.0–10.5)

## 2013-08-10 MED ORDER — GUAIFENESIN-CODEINE 100-10 MG/5ML PO SYRP: 5.0000 mL | ORAL_SOLUTION | Freq: Three times a day (TID) | ORAL | Status: DC | PRN

## 2013-08-10 NOTE — Patient Instructions (Signed)
Nice to see you again. We are going to check some labs regarding your cough. Please also go to the hospital radiology department to get a chest X-ray this afternoon. We will call you with the results of these studies.  Chronic Obstructive Pulmonary Disease Chronic obstructive pulmonary disease (COPD) is a lung disease. The lungs become damaged. This makes it hard to get air in and out of your lungs. The damage to your lungs cannot be changed. There are things you can do to improve the lungs and make you feel better. HOME CARE  Take all medicines as told by your doctor.  Use medicines that you breathe in (inhale) as told by your doctor.  Avoid medicines or cough syrups that dry up your airway (antihistamines) and do not allow you to get rid of thick spit.  If you smoke, stop.  Avoid being around smoke, chemicals, and fumes that bother your breathing.  Avoid people that have a catchy (contagious) sickness.  Avoid going outside when it is very hot, cold, or humid.  Use humidifiers in your home and near your bedside if it helps your breathing.  Drink enough fluids to keep your pee (urine) clear or pale yellow.  Eat healthy foods. Eat smaller meals more often and rest before meals.  Ask your doctor if it is okay to take vitamins or pills with minerals (supplements).  Exercise and stay active.  Rest with activity.  Get into a comfortable position when you have trouble breathing.  Learn and use tips on how to relax.  Learn and use tips on how to control your breathing as told by your doctor. Try:  Breathing in through your nose for 1 second. Then, breath out (exhale) through your puckered (like a whistle) lips for 2 seconds.  Putting one hand on your belly (abdomen). Breathe in slowly through your nose. Your hand on your belly should move out. Breathe out slowly through your puckered lips. Your hand on your belly should move in as you breathe out.  Learn and use controlled  coughing to clear thick spit from your lungs. 1. Lean your head slightly forward. 2. Breathe in deeply. 3. Try to hold your breath for 3 seconds. 4. Keep your mouth slightly open while coughing 2 times. 5. Spit any thick spit out into a tissue. 6. Rest and do the steps again 1 or 2 times as needed.  Get all shots (vaccines) a told by your doctor.  Learn how to manage stress.  Schedule and go to all follow-up doctor visits.  Go to therapy that can help you improve your lungs (pulmonary rehabilitation) as told by your doctor.  Use oxygen at home as told by your doctor. GET HELP RIGHT AWAY IF:   You can feel your heart beating really fast.  You have shortness of breath while resting.  You have shortness of breath that stops you from being able to talk.  You have shortness of breath that stops you from doing normal activities.  You have chest pain lasting longer than 5 minutes.  You start to shake uncontrollably (seizure).  Your family or friends notice that you are flustered or confused.  You cough up more thick spit than usual.  There is a change in the color or thickness of the spit.  Breathing is more difficult than usual.  Your breathing is faster than usual.  Your skin color is more blueish than usual.  You are running out of the medicine you take for breathing.  You are anxious, uneasy, fearful, or restless.  You have a fever. MAKE SURE YOU:   Understand these instructions.  Will watch your condition.  Will get help right away if you are not doing well or get worse. Document Released: 02/04/2008 Document Revised: 08/04/2012 Document Reviewed: 04/14/2013 [facility] Patient Information 2014 [place], [place].

## 2013-08-11 LAB — PRO B NATRIURETIC PEPTIDE: Pro B Natriuretic peptide (BNP): 112.5 pg/mL (ref <126)

## 2013-08-12 ENCOUNTER — Other Ambulatory Visit: Payer: Self-pay | Admitting: Family Medicine

## 2013-08-12 ENCOUNTER — Telehealth: Payer: Self-pay | Admitting: *Deleted

## 2013-08-12 DIAGNOSIS — R0602 Shortness of breath: Secondary | ICD-10-CM

## 2013-08-12 NOTE — Progress Notes (Signed)
Patient ID: [name], female   DOB: 17-Aug-1951, 62 y.o.   MRN: [id number]  Marikay Alar, MD Phone: [phone]  [name] is a 62 y.o. female who presents today for f/u of breathing.  Patient notes is still short of breath. She has not reached her baseline. She denies fevers, though notes night sweats. She endorses congestion and cough has continued despite treatment with cough syrup, prednisone, and albuterol. She is now using albuterol every night, though not as much during the day. She additionally endorses 3 pillow orthopnea and waking up dyspneic at night.   Past Medical History  Diagnosis Date  . Nausea & vomiting   . COPD (chronic obstructive pulmonary disease)   . Smoker   . PSORIASIS 12/29/2006    Qualifier: Diagnosis of  By: Reche Dixon MD, Onalee Hua    . Hx of colonic polyps   . Asthma     History  Smoking status  . Current Some Day Smoker -- 0.25 packs/day  . Types: Cigarettes  Smokeless tobacco  . Never Used    Family History  Problem Relation Age of Onset  . Cancer Other   . Coronary artery disease Other   . Cancer Mother   . Hypertension Mother   . Heart disease Mother   . Cancer Father   . Hypertension Father   . Cancer Sister   . Hypertension Sister     Current Outpatient Prescriptions on File Prior to Visit  Medication Sig Dispense Refill  . acetaminophen (TYLENOL) 500 MG tablet Take 1,000 mg by mouth every 6 (six) hours as needed for pain.      Marland Kitchen albuterol (PROAIR HFA) 108 (90 BASE) MCG/ACT inhaler Inhale 2 puffs into the lungs every 6 (six) hours as needed for wheezing or shortness of breath. For the next 2 days please use every 6 hours, after that please use every 6 hours as needed.  1 Inhaler  1  . albuterol (PROAIR HFA) 108 (90 BASE) MCG/ACT inhaler Inhale 2 puffs into the lungs every 6 (six) hours as needed for wheezing or shortness of breath.  1 Inhaler  1  . guaiFENesin-dextromethorphan (ROBITUSSIN DM) 100-10 MG/5ML syrup Take 5 mLs by mouth  every 4 (four) hours as needed for cough.  118 mL  0  . ondansetron (ZOFRAN ODT) 8 MG disintegrating tablet Take 1 tablet (8 mg total) by mouth every 8 (eight) hours as needed for nausea.  20 tablet  0  . promethazine (PHENERGAN) 25 MG tablet Take 1 tablet (25 mg total) by mouth every 6 (six) hours as needed for nausea.  30 tablet  0  . tiotropium (SPIRIVA) 18 MCG inhalation capsule Place 1 capsule (18 mcg total) into inhaler and inhale daily.  30 capsule  3   No current facility-administered medications on file prior to visit.    ROS: Per HPI   Physical Exam Filed Vitals:   08/10/13 1348  BP: 152/88  Pulse: 72  Temp: 98.3 F (36.8 C)    Physical Examination: General appearance - alert, well appearing, and in no distress Eyes - pupils equal and reactive, extraocular eye movements intact Mouth - oropharynx with mild erythema Neck - supple, no significant adenopathy Chest - coarse upper airway sounds heard that cleared to some degreee with coughing, mild wheezes heard, good air movement, no respiratory distress Heart - normal rate, regular rhythm, normal S1, S2, no murmurs, rubs, clicks or gallops Extremities - no pedal edema noted   Assessment/Plan: Please see individual problem list.

## 2013-08-12 NOTE — Telephone Encounter (Signed)
Attempted to contact again.  Once again straight to VM.  Pt has appt with pcp on Monday. [name], [name]

## 2013-08-12 NOTE — Assessment & Plan Note (Signed)
Patient with continued shortness of breath despite treatment with steroids and antibiotics. She improved with these treatments, though not to baseline, bringing the diagnosis of COPD exacerbation into question. Concern would be for infectious process vs COPD exacerbation vs CHF. CXR revealed COPD and potential acute bronchitis, no PNA or volume overload. BNP was 112 and normal making CHF unlikely. CBC did not reveal an infectious process or anemia. Sputum culture revealed oropharyngeal flora. Have asked that patient return for a lab appointment for  A d-dimer to rule out PE given persistence of symptoms despite seemingly appropriate treatments. Patient with a wells score of 0 makes PE unlikely. Discussed redflags for return to care with the patient.

## 2013-08-12 NOTE — Telephone Encounter (Signed)
Message copied by Osborne Oman on Fri Aug 12, 2013 12:15 PM ------      Message from: Birdie Sons, ERIC G      Created: Fri Aug 12, 2013 12:04 PM       Patient with findings of COPD on CXR. No infiltrate seen to indicate pneumonia. Given that this issue has not gotten better with multiple courses of treatment I would like the patient to come in for a lab draw of a D-dimer to rule out PE. Her wells score is 0, so if negative this would rule out this possibility and mean that this issue is most likely related to her COPD. ------

## 2013-08-12 NOTE — Telephone Encounter (Signed)
LMOVM for pt to return call.  When she calls back she will need to make a lab appt.  Preferably today.  No pneumonia seen on xray so Dr. Birdie Sons would like to do one more lab test. [name], [name]

## 2013-08-13 LAB — RESPIRATORY CULTURE OR RESPIRATORY AND SPUTUM CULTURE: Gram Stain: NONE SEEN

## 2013-08-15 ENCOUNTER — Encounter: Payer: Self-pay | Admitting: Family Medicine

## 2013-08-15 ENCOUNTER — Ambulatory Visit (INDEPENDENT_AMBULATORY_CARE_PROVIDER_SITE_OTHER): Payer: Medicare Other | Admitting: Family Medicine

## 2013-08-15 VITALS — BP 145/79 | HR 83 | Temp 98.5°F | Ht 66.5 in | Wt 134.0 lb

## 2013-08-15 DIAGNOSIS — J449 Chronic obstructive pulmonary disease, unspecified: Secondary | ICD-10-CM

## 2013-08-15 DIAGNOSIS — G8929 Other chronic pain: Secondary | ICD-10-CM | POA: Insufficient documentation

## 2013-08-15 DIAGNOSIS — R109 Unspecified abdominal pain: Secondary | ICD-10-CM | POA: Diagnosis not present

## 2013-08-15 DIAGNOSIS — R0602 Shortness of breath: Secondary | ICD-10-CM

## 2013-08-15 DIAGNOSIS — J4489 Other specified chronic obstructive pulmonary disease: Secondary | ICD-10-CM

## 2013-08-15 HISTORY — DX: Unspecified abdominal pain: R10.9

## 2013-08-15 LAB — BASIC METABOLIC PANEL
CO2: 29 mEq/L (ref 19–32)
Chloride: 104 mEq/L (ref 96–112)
Glucose, Bld: 56 mg/dL — ABNORMAL LOW (ref 70–99)
Potassium: 4.6 mEq/L (ref 3.5–5.3)
Sodium: 142 mEq/L (ref 135–145)

## 2013-08-15 LAB — D-DIMER, QUANTITATIVE (NOT AT ARMC): D-Dimer, Quant: 0.22 ug/mL-FEU (ref 0.00–0.48)

## 2013-08-15 MED ORDER — FORMOTEROL FUMARATE 12 MCG IN CAPS: 12.0000 ug | ORAL_CAPSULE | Freq: Two times a day (BID) | RESPIRATORY_TRACT | Status: DC

## 2013-08-15 NOTE — Patient Instructions (Addendum)
Nice to see you again. Please go to your CT appointment to evaluate the pain in your belly. I have added an additional inhaler called formoterol. Please use this every day in addition to your spiriva. You can go back to using the albuterol inhaler as needed.  Chronic Obstructive Pulmonary Disease Chronic obstructive pulmonary disease (COPD) is a lung disease. The lungs become damaged. This makes it hard to get air in and out of your lungs. The damage to your lungs cannot be changed. There are things you can do to improve the lungs and make you feel better. HOME CARE  Take all medicines as told by your doctor.  Use medicines that you breathe in (inhale) as told by your doctor.  Avoid medicines or cough syrups that dry up your airway (antihistamines) and do not allow you to get rid of thick spit.  If you smoke, stop.  Avoid being around smoke, chemicals, and fumes that bother your breathing.  Avoid people that have a catchy (contagious) sickness.  Avoid going outside when it is very hot, cold, or humid.  Use humidifiers in your home and near your bedside if it helps your breathing.  Drink enough fluids to keep your pee (urine) clear or pale yellow.  Eat healthy foods. Eat smaller meals more often and rest before meals.  Ask your doctor if it is okay to take vitamins or pills with minerals (supplements).  Exercise and stay active.  Rest with activity.  Get into a comfortable position when you have trouble breathing.  Learn and use tips on how to relax.  Learn and use tips on how to control your breathing as told by your doctor. Try:  Breathing in through your nose for 1 second. Then, breath out (exhale) through your puckered (like a whistle) lips for 2 seconds.  Putting one hand on your belly (abdomen). Breathe in slowly through your nose. Your hand on your belly should move out. Breathe out slowly through your puckered lips. Your hand on your belly should move in as you  breathe out.  Learn and use controlled coughing to clear thick spit from your lungs. 1. Lean your head slightly forward. 2. Breathe in deeply. 3. Try to hold your breath for 3 seconds. 4. Keep your mouth slightly open while coughing 2 times. 5. Spit any thick spit out into a tissue. 6. Rest and do the steps again 1 or 2 times as needed.  Get all shots (vaccines) a told by your doctor.  Learn how to manage stress.  Schedule and go to all follow-up doctor visits.  Go to therapy that can help you improve your lungs (pulmonary rehabilitation) as told by your doctor.  Use oxygen at home as told by your doctor. GET HELP RIGHT AWAY IF:   You can feel your heart beating really fast.  You have shortness of breath while resting.  You have shortness of breath that stops you from being able to talk.  You have shortness of breath that stops you from doing normal activities.  You have chest pain lasting longer than 5 minutes.  You start to shake uncontrollably (seizure).  Your family or friends notice that you are flustered or confused.  You cough up more thick spit than usual.  There is a change in the color or thickness of the spit.  Breathing is more difficult than usual.  Your breathing is faster than usual.  Your skin color is more blueish than usual.  You are running out  of the medicine you take for breathing.  You are anxious, uneasy, fearful, or restless.  You have a fever. MAKE SURE YOU:   Understand these instructions.  Will watch your condition.  Will get help right away if you are not doing well or get worse. Document Released: 02/04/2008 Document Revised: 08/04/2012 Document Reviewed: 04/14/2013 [facility] Patient Information 2014 [place], [place].

## 2013-08-17 ENCOUNTER — Ambulatory Visit
Admission: RE | Admit: 2013-08-17 | Discharge: 2013-08-17 | Disposition: A | Discharge status: Home / Self Care | Payer: Medicare Other | Source: Ambulatory Visit | Attending: Family Medicine | Admitting: Family Medicine

## 2013-08-17 DIAGNOSIS — K709 Alcoholic liver disease, unspecified: Secondary | ICD-10-CM | POA: Diagnosis not present

## 2013-08-17 DIAGNOSIS — R109 Unspecified abdominal pain: Secondary | ICD-10-CM

## 2013-08-17 MED ORDER — IOHEXOL 300 MG/ML  SOLN
100.0000 mL | Freq: Once | INTRAMUSCULAR | Status: AC | PRN
  Administered 2013-08-17: 100 mL via INTRAVENOUS

## 2013-08-18 MED ORDER — POLYETHYLENE GLYCOL 3350 17 GM/SCOOP PO POWD: 17.0000 g | Freq: Every day | ORAL | Status: DC

## 2013-08-18 NOTE — Assessment & Plan Note (Signed)
Please see shortness of breath for plan.

## 2013-08-18 NOTE — Assessment & Plan Note (Addendum)
Patient with new onset crampy abdominal pain around her umbilicus. She reports a long history of constipation and also a history of hernia repair. There is a firmness at her umbilicus on exam. Most likely this pain is related to constipation, though must consider bowel obstruction or failure of hernia repair mesh. Will obtain a CT abd/pelvis to evaluate cause. Will treat constipation with miralax.

## 2013-08-18 NOTE — Assessment & Plan Note (Addendum)
Shortness of breath improved today. PE is a remaining cause that has not been ruled out as a cause of her worsened shortness of breath. Will obtain a D-dimer to evaluate for this. Most likely this represents a worsening of her COPD. Her therapy has not been maximized at this point so will add formoterol inhaler to her current spiriva. She is to use her albuterol inhaler prn.

## 2013-08-18 NOTE — Progress Notes (Signed)
Patient ID: [name], female   DOB: 1951/04/29, 62 y.o.   MRN: [id number]  Marikay Alar, MD Phone: [phone]  [name] is a 62 y.o. female who presents today for f/u of dyspnea and to discuss stomach pain.  COPD: patient seen several times over the past month for worsened dyspnea and productive cough. States she has been feeling better since I last saw her. She notes she has been using her spiriva every day and has been using her albuterol 3x/day. She notes breathing better, though still has some orthopnea. Note she has been treated with 2 courses of prednisone, one course of bactrim. Her work-up has included a normal CBC, normal pro-BNP, and a CXR that revealed no evidence of PNA or CHF, with most likely findings of acute bronchitis. Patient does note a sharp chest pain in the center of her chest that occurs only after she has been coughing for a long time. No radiation with this.  Abdominal Pain: she notes starting on Friday she has had a periumbilical pain cramping in nature. She notes there is no radiation. She states she has not had a BM since last Tuesday. She notes only having one BM per week for the past 4-5 years and that her BMs are hard little pellets. She denies blood in her BMs. She has a history of hernia repair at her umbilicus 10+ years ago.  Past Medical History  Diagnosis Date  . Nausea & vomiting   . COPD (chronic obstructive pulmonary disease)   . Smoker   . PSORIASIS 12/29/2006    Qualifier: Diagnosis of  By: Reche Dixon MD, Onalee Hua    . Hx of colonic polyps   . Asthma     History  Smoking status  . Current Some Day Smoker -- 0.25 packs/day  . Types: Cigarettes  Smokeless tobacco  . Never Used    Family History  Problem Relation Age of Onset  . Cancer Other   . Coronary artery disease Other   . Cancer Mother   . Hypertension Mother   . Heart disease Mother   . Cancer Father   . Hypertension Father   . Cancer Sister   . Hypertension Sister      Current Outpatient Prescriptions on File Prior to Visit  Medication Sig Dispense Refill  . acetaminophen (TYLENOL) 500 MG tablet Take 1,000 mg by mouth every 6 (six) hours as needed for pain.      Marland Kitchen albuterol (PROAIR HFA) 108 (90 BASE) MCG/ACT inhaler Inhale 2 puffs into the lungs every 6 (six) hours as needed for wheezing or shortness of breath. For the next 2 days please use every 6 hours, after that please use every 6 hours as needed.  1 Inhaler  1  . albuterol (PROAIR HFA) 108 (90 BASE) MCG/ACT inhaler Inhale 2 puffs into the lungs every 6 (six) hours as needed for wheezing or shortness of breath.  1 Inhaler  1  . guaiFENesin-codeine (ROBITUSSIN AC) 100-10 MG/5ML syrup Take 5 mLs by mouth 3 (three) times daily as needed for cough.  120 mL  0  . guaiFENesin-dextromethorphan (ROBITUSSIN DM) 100-10 MG/5ML syrup Take 5 mLs by mouth every 4 (four) hours as needed for cough.  118 mL  0  . ondansetron (ZOFRAN ODT) 8 MG disintegrating tablet Take 1 tablet (8 mg total) by mouth every 8 (eight) hours as needed for nausea.  20 tablet  0  . promethazine (PHENERGAN) 25 MG tablet Take 1 tablet (25 mg total) by mouth  every 6 (six) hours as needed for nausea.  30 tablet  0  . tiotropium (SPIRIVA) 18 MCG inhalation capsule Place 1 capsule (18 mcg total) into inhaler and inhale daily.  30 capsule  3   No current facility-administered medications on file prior to visit.    ROS: Per HPI   Physical Exam Filed Vitals:   08/15/13 1348  BP: 145/79  Pulse: 83  Temp: 98.5 F (36.9 C)    Physical Examination: General appearance - alert, well appearing, and in no distress Chest - patient with coarse referred upper airway sounds, no wheezing Heart - normal rate, regular rhythm, normal S1, S2, no murmurs, rubs, clicks or gallops Abdomen - soft, tender around the umbilicus, patient with firm knot in the abdominal wall at the umbilicus   Assessment/Plan: Please see individual problem list.

## 2013-08-22 ENCOUNTER — Telehealth: Payer: Self-pay | Admitting: *Deleted

## 2013-08-22 NOTE — Telephone Encounter (Signed)
Message copied by Osborne Oman on Mon Aug 22, 2013 10:08 AM ------      Message from: Birdie Sons, ERIC G      Created: Fri Aug 19, 2013  4:53 PM       Patient with stable surgical changes at the site of her hernia repair. If pain continues at this site we may need to consider follow-up with a surgeon for evaluation.  Please inform the patient. ------

## 2013-08-22 NOTE — Telephone Encounter (Signed)
LMOVM for pt to return call .Yzabelle Calles Dawn  

## 2014-01-17 DIAGNOSIS — H2510 Age-related nuclear cataract, unspecified eye: Secondary | ICD-10-CM | POA: Diagnosis not present

## 2014-04-27 ENCOUNTER — Encounter: Payer: Self-pay | Admitting: Family Medicine

## 2014-04-27 ENCOUNTER — Ambulatory Visit (INDEPENDENT_AMBULATORY_CARE_PROVIDER_SITE_OTHER): Payer: Medicare Other | Admitting: Family Medicine

## 2014-04-27 VITALS — BP 132/84 | HR 84 | Temp 98.3°F | Ht 66.25 in | Wt 156.0 lb

## 2014-04-27 DIAGNOSIS — R1115 Cyclical vomiting syndrome unrelated to migraine: Secondary | ICD-10-CM | POA: Diagnosis not present

## 2014-04-27 DIAGNOSIS — R51 Headache: Secondary | ICD-10-CM

## 2014-04-27 DIAGNOSIS — L408 Other psoriasis: Secondary | ICD-10-CM

## 2014-04-27 MED ORDER — CLOBETASOL PROPIONATE 0.05 % EX OINT
1.0000 | TOPICAL_OINTMENT | Freq: Two times a day (BID) | CUTANEOUS | Status: DC
Start: 2014-04-27 — End: 2015-09-28

## 2014-04-27 MED ORDER — PROMETHAZINE HCL 25 MG PO TABS: 25.0000 mg | ORAL_TABLET | Freq: Four times a day (QID) | ORAL | Status: DC | PRN

## 2014-04-27 MED ORDER — OMEPRAZOLE 20 MG PO CPDR: 20.0000 mg | DELAYED_RELEASE_CAPSULE | Freq: Every day | ORAL | Status: DC

## 2014-04-27 NOTE — Patient Instructions (Signed)
Nice to see you. I'm glad you are doing better. Please take the phenergan for your nausea. Please also take the omeprazole. I will see you back in 2 weeks to see if this helped. Use the steroid cream on your psoriasis.  You can take tylenol for your headache. Tension Headache A tension headache is pain, pressure, or aching felt over the front and sides of the head. Tension headaches often come after stress, feeling worried (anxiety), or feeling sad or down for a while (depressed). HOME CARE  Only take medicine as told by your doctor.  Lie down in a dark, quiet room when you have a headache.  Keep a journal to find out if certain things bring on headaches. For example, write down:  What you eat and drink.  How much sleep you get.  Any change to your diet or medicines.  Relax by getting a massage or doing other relaxing activities.  Put ice or heat packs on the head and neck area as told by your doctor.  Lessen stress.  Sit up straight. Do not tighten (tense) your muscles.  Quit smoking if you smoke.  Lessen how much alcohol you drink.  Lessen how much caffeine you drink, or stop drinking caffeine.  Eat and exercise regularly.  Get enough sleep.  Avoid using too much pain medicine. GET HELP RIGHT AWAY IF:   Your headache becomes really bad.  You have a fever.  You have a stiff neck.  You have trouble seeing.  Your muscles are weak, or you lose muscle control.  You lose your balance or have trouble walking.  You feel like you will pass out (faint), or you pass out.  You have really bad symptoms that are different than your first symptoms.  You have problems with the medicines given to you by your doctor.  Your medicines do not work.  Your headache feels different than the other headaches.  You feel sick to your stomach (nauseous) or throw up (vomit). MAKE SURE YOU:   Understand these instructions.  Will watch your condition.  Will get help right  away if you are not doing well or get worse. Document Released: 11/12/2009 Document Revised: 11/10/2011 Document Reviewed: 08/08/2011 [facility] Patient Information 2015 [place], [place]. This information is not intended to replace advice given to you by your health care provider. Make sure you discuss any questions you have with your health care provider.

## 2014-04-29 DIAGNOSIS — R519 Headache, unspecified: Secondary | ICD-10-CM | POA: Insufficient documentation

## 2014-04-29 DIAGNOSIS — R51 Headache: Secondary | ICD-10-CM | POA: Insufficient documentation

## 2014-04-29 NOTE — Assessment & Plan Note (Signed)
Patient with classic appearing plaques on knees and hands. Will treat with clobetasol cream. If no improvement may need to consider referral to dermatology for discussion of injectable treatment.

## 2014-04-29 NOTE — Assessment & Plan Note (Addendum)
Tension type headache. No red flags on history of exam. Advised use of tylenol as this has been working. Given return precautions. If not improving or worsening will consider additional work-up. F/u 2 weeks.

## 2014-04-29 NOTE — Progress Notes (Signed)
Patient ID: [name], female   DOB: 15-Apr-1951, 63 y.o.   MRN: [id number]  Tommi Rumps, MD Phone: [phone]  [name] is a 63 y.o. female who presents today for f/u.  Headache: notes in bilateral temples. Worse for the past month. Has history of same type of headache. Throbbing in nature. No radiation, photophobia, or aura. Some phonophobia. No weakness or numbness. Tylenol helps. Has previously taken this twice a day.  Vomiting: patient notes for past 2 weeks has had nausea and NBNB vomiting. Her appetite has decreased. Previously this had improved and she has gained 20 lbs since her last visit. She has continued to drink well. She has been taking phenergan for this which helps, though she is almost out. She does note sour taste in the back of her mouth and belches a lot. No abdominal pain. She smokes marijuana as well that helps with her nausea. Does this once a week.  Psoriasis: notes she has had a flare of her psoriasis. Has patches coming up on her hands and on her knees. She has not seen a dermatologist for this. She was previously on a steroid cream that helped with this.   Patient is a smoker.   ROS: Per HPI   Physical Exam Filed Vitals:   04/27/14 1625  BP: 132/84  Pulse: 84  Temp: 98.3 F (36.8 C)    Gen: Well NAD HEENT: PERRL,  MMM Lungs: CTABL Nl WOB Heart: RRR no MRG Abd: soft, NT, ND Exts: Non edematous BL  LE, warm and well perfused.  Neuro: CN 2-12 intact, 5/5 strength in bilateral biceps, triceps, grip, quads, hamstrings, plantar and dorsiflexion, sensation to light touch intact in bilateral UE and LE, normal gait, 2+ patellar reflexes Skin: small red circular plaques on bilateral knees and hands  Assessment/Plan: Please see individual problem list.  # Healthcare maintenance: needs follow-up for annual exam  Tommi Rumps, MD [place] PGY-3

## 2014-04-29 NOTE — Assessment & Plan Note (Addendum)
Has had recurrence of this issue. Had been doing well and gained some weight. Is still able to keep fluids down at this point. Will give phenergan refill. Will start on PPI for possible GERD component. May need to recheck for h pylori if not improving. Has been treated for this in the past. Advised to continue intake of fluids and food as tolerated. If becomes difficult to control again will consider referral to functional GI clinic at [facility]. F/u 2 weeks.

## 2014-05-01 ENCOUNTER — Encounter: Payer: Self-pay | Admitting: *Deleted

## 2014-05-01 NOTE — Progress Notes (Signed)
Form given to PCP to complete during office visit 04/27/2014.  Form placed in provider box to review.  Derl Barrow, RN

## 2014-05-03 ENCOUNTER — Encounter: Payer: Self-pay | Admitting: Family Medicine

## 2014-05-03 DIAGNOSIS — H35031 Hypertensive retinopathy, right eye: Secondary | ICD-10-CM | POA: Insufficient documentation

## 2014-05-03 DIAGNOSIS — H269 Unspecified cataract: Secondary | ICD-10-CM | POA: Insufficient documentation

## 2014-05-03 DIAGNOSIS — H35032 Hypertensive retinopathy, left eye: Secondary | ICD-10-CM | POA: Insufficient documentation

## 2014-05-03 HISTORY — DX: Unspecified cataract: H26.9

## 2014-05-03 NOTE — Progress Notes (Signed)
Patient ID: [name], female   DOB: 03-May-1951, 63 y.o.   MRN: [id number] Form returned to [facility].

## 2014-05-04 NOTE — Progress Notes (Signed)
Left voice message 05/03/2014 with female for pt to pick up form.  Derl Barrow, RN

## 2014-05-16 ENCOUNTER — Ambulatory Visit (INDEPENDENT_AMBULATORY_CARE_PROVIDER_SITE_OTHER): Payer: Medicare Other | Admitting: Family Medicine

## 2014-05-16 ENCOUNTER — Encounter: Payer: Self-pay | Admitting: Family Medicine

## 2014-05-16 ENCOUNTER — Ambulatory Visit (HOSPITAL_COMMUNITY)
Admission: RE | Admit: 2014-05-16 | Discharge: 2014-05-16 | Disposition: A | Discharge status: Home / Self Care | Payer: Medicare Other | Source: Ambulatory Visit | Attending: Family Medicine | Admitting: Family Medicine

## 2014-05-16 VITALS — BP 132/79 | HR 90 | Temp 98.3°F | Wt 162.0 lb

## 2014-05-16 DIAGNOSIS — R1115 Cyclical vomiting syndrome unrelated to migraine: Secondary | ICD-10-CM | POA: Diagnosis not present

## 2014-05-16 DIAGNOSIS — R059 Cough, unspecified: Secondary | ICD-10-CM | POA: Insufficient documentation

## 2014-05-16 DIAGNOSIS — J441 Chronic obstructive pulmonary disease with (acute) exacerbation: Secondary | ICD-10-CM

## 2014-05-16 DIAGNOSIS — J449 Chronic obstructive pulmonary disease, unspecified: Secondary | ICD-10-CM

## 2014-05-16 DIAGNOSIS — Z23 Encounter for immunization: Secondary | ICD-10-CM | POA: Diagnosis not present

## 2014-05-16 DIAGNOSIS — R058 Other specified cough: Secondary | ICD-10-CM

## 2014-05-16 DIAGNOSIS — R05 Cough: Secondary | ICD-10-CM

## 2014-05-16 MED ORDER — AZITHROMYCIN 250 MG PO TABS: ORAL_TABLET | ORAL | Status: DC

## 2014-05-16 MED ORDER — PREDNISONE 20 MG PO TABS: 40.0000 mg | ORAL_TABLET | Freq: Every day | ORAL | Status: DC

## 2014-05-16 MED ORDER — GUAIFENESIN-CODEINE 100-10 MG/5ML PO SYRP: 5.0000 mL | ORAL_SOLUTION | Freq: Three times a day (TID) | ORAL | Status: DC | PRN

## 2014-05-16 NOTE — Assessment & Plan Note (Signed)
Patient with no active vomiting at this time. She does remain nauseated. Will have her stop the omeprazole as this has not been beneficial. She is to continue phenergan prn. Will plan to see back in 2 weeks for repeat H pylori testing, she has been positive for this in the past and responded well to treatment.

## 2014-05-16 NOTE — Progress Notes (Signed)
Patient ID: [name], female   DOB: 03/05/1951, 63 y.o.   MRN: [id number]  Tommi Rumps, MD Phone: [phone]  [name] is a 63 y.o. female who presents today for f/u.  Nausea: patient reports she has continued to have some degree of nausea since last seen. She has not been vomiting. She has been able to keep her food down and has an appetite. She notes the phenergan has continued to help. She does not feel the omeprazole has helped much. She notes minimal epigastric buring sensation. She denies a sour taste in back of her mouth. She has been gaining weight.   Productive cough: patient notes that for the past week she has had increasing cough productive of clear sputum. She notes a mild increase in shortness of breath as well. She denies chest pressure, though notes a soreness just superior to her xyphoid process when she coughs. This pain does not radiate and she denies fevers and diaphoresis. She has been using her albuterol 1-2 times per day. She uses her spiriva daily.   Patient is a smoker.   ROS: Per HPI   Physical Exam Filed Vitals:   05/16/14 1449  BP: 132/79  Pulse: 90  Temp: 98.3 F (36.8 C)  O2 sat 99%  Gen: Well NAD HEENT: PERRL,  MMM Lungs: CTABL, no wheezing or crackles, Nl WOB Heart: RRR no MRG MSK: there is tenderness to palpation of the area just superior to the xyphoid process that reproduces pain Abd: soft, NT, ND Exts: Non edematous BL  LE, warm and well perfused.    Assessment/Plan: Please see individual problem list.  # Healthcare maintenance: flu shot given today  Tommi Rumps, MD [place] PGY-3

## 2014-05-16 NOTE — Patient Instructions (Signed)
Nice to see you. I would like to stop the omeprazole since this did not help much. I will see you back in 2 weeks to test for H pylori again. I think you are having an exacerbation of your chronic bronchitis. I would like you to go to the radiology department in the hospital to have a chest x-ray done. I am going to start you on an antibiotic and steroid pills for the next 5 days. If you develop worsening cough, shortness of breath, fever, or chest pain please go to the ED.

## 2014-05-16 NOTE — Assessment & Plan Note (Addendum)
Patient with current COPD exacerbation with increased productive cough and shortness of breath. Her O2 sats are normal at 99%. She is currently afebrile. She has no evidence of PNA on pulmonary exam. We will obtain a CXR to rule out PNA or other pulmonary causes of this cough and shortness of breath. Will treat with azithromycin and prednisone for 5 days. Robitussin AC given as well. Will call with results of CXR. Given return precautions. She is to return to care later this week if her cough does not improve.

## 2014-05-17 ENCOUNTER — Encounter: Payer: Self-pay | Admitting: Family Medicine

## 2014-05-17 ENCOUNTER — Telehealth: Payer: Self-pay | Admitting: Family Medicine

## 2014-05-17 NOTE — Telephone Encounter (Signed)
Letter created and sent to the admin pool. Thanks.

## 2014-05-17 NOTE — Telephone Encounter (Signed)
Same response.  Can we mail a letter? [name], [name]

## 2014-05-17 NOTE — Telephone Encounter (Signed)
I attempted to call the patient to tell her that her chest x-ray appeared normal and had no signs of a pneumonia. There was no answer and no voicemail to leave a message. Will have blue team nurses attempt to call patient later this afternoon.

## 2014-05-31 ENCOUNTER — Ambulatory Visit: Payer: Medicare Other | Admitting: Family Medicine

## 2014-06-05 ENCOUNTER — Other Ambulatory Visit: Payer: Self-pay | Admitting: Family Medicine

## 2014-10-04 ENCOUNTER — Other Ambulatory Visit: Payer: Self-pay | Admitting: Family Medicine

## 2014-10-05 NOTE — Telephone Encounter (Signed)
LM for patient with a female at home to have patient call back.  She will need to make a COPD follow up appt. Jazmin Hartsell,CMA

## 2014-10-05 NOTE — Telephone Encounter (Signed)
Refill given. Patient needs appointment to f/u on COPD. Please inform her of this. Thanks.

## 2014-11-09 ENCOUNTER — Encounter: Payer: Self-pay | Admitting: Family Medicine

## 2014-11-09 ENCOUNTER — Ambulatory Visit
Admission: RE | Admit: 2014-11-09 | Discharge: 2014-11-09 | Disposition: A | Discharge status: Home / Self Care | Payer: Medicare Other | Source: Ambulatory Visit | Attending: Family Medicine | Admitting: Family Medicine

## 2014-11-09 ENCOUNTER — Ambulatory Visit (INDEPENDENT_AMBULATORY_CARE_PROVIDER_SITE_OTHER): Payer: Medicare Other | Admitting: Family Medicine

## 2014-11-09 VITALS — BP 114/74 | HR 78 | Temp 98.3°F | Wt 166.0 lb

## 2014-11-09 DIAGNOSIS — M79645 Pain in left finger(s): Secondary | ICD-10-CM | POA: Diagnosis not present

## 2014-11-09 DIAGNOSIS — M19032 Primary osteoarthritis, left wrist: Secondary | ICD-10-CM | POA: Diagnosis not present

## 2014-11-09 DIAGNOSIS — M654 Radial styloid tenosynovitis [de Quervain]: Secondary | ICD-10-CM

## 2014-11-09 DIAGNOSIS — M25532 Pain in left wrist: Secondary | ICD-10-CM

## 2014-11-09 HISTORY — DX: Radial styloid tenosynovitis (de quervain): M65.4

## 2014-11-09 NOTE — Progress Notes (Addendum)
Patient ID: [name], female   DOB: 27-Apr-1951, 64 y.o.   MRN: [id number] Subjective:   CC: Left hand swelling and erythema  HPI:   Same day evaluation for left hand swelling the patient describes has been present for about 5 days and is worsening. Pain starts in her left thumb and radiates proximally to mid forearm on the lateral side of the arm. She notes some swelling and erythema. She denies any trauma or increased activity. It hurts more when she tries to grip her hand or move her wrist in any direction. She's never had this before. She denies any fevers or chills. She has not tried any medications for this. Pain is achy. No numbness or tingling into hand.  Review of Systems - Per HPI.   PMH - epigastric abdominal pain, chronic bronchitis, COPD, cyclic vomiting, GERD, headaches, hypertension with retinopathy, protein calorie malnutrition, smoker Social history: Patient used to work as a Garment/textile technologist. She is right-handed.    Objective:  Physical Exam BP 114/74 mmHg  Pulse 78  Temp(Src) 98.3 F (36.8 C) (Oral)  Wt 166 lb (75.297 kg) GEN: NAD Extremities: Left wrist and forearm very mildly swollen and erythematous Full range of motion though limited wrist flexion and extension due to pain Severe tenderness along the lateral wrist at [facility] joint extending about 3 inches proximal along the lateral wrist 4 out of 5 wrist flexion and extension likely limited by pain Mild crepitus with axial loading and rotation of thumb Positive finkelstein test left wrist No obvious squaring of CMC joint    Assessment:     [name] is a 64 y.o. female here for left wrist pain, swelling, and erythema    Plan:     # See problem list and after visit summary for problem-specific plans. -Patient also requested some Robitussin with codeine that she had in the past. I told her I would forward this request her primary doctor due to narcotic.  # Health Maintenance: Not discussed  Follow-up: Follow  up in 2 weeks for follow-up of wrist/thumb pain.     Hilton Sinclair, MD [place]

## 2014-11-09 NOTE — Assessment & Plan Note (Signed)
Differential includes wrist arthritis versus more likely de Quervain's tenosynovitis per history and exam. No trauma, numbness or tingling into the hand. -We'll order left wrist x-ray -Discussed conservative management with rest, ice, and Tylenol due to gastritis symptoms with NSAIDs -Thumb spica splint prescription given for comfort -Follow-up in one to 2 weeks. If no improvement, can consider steroid injection

## 2014-11-09 NOTE — Addendum Note (Signed)
Addended by: Conni Slipper T on: 11/09/2014 06:10 PM   Modules accepted: Level of Service

## 2014-11-09 NOTE — Patient Instructions (Signed)
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 I'm ordering an x-ray of your left wrist. Rest, use ice 2-3 times a day, and use Tylenol as needed for 2 weeks, not to use more than 3 g per day Use the thumb splint as needed for pain. Follow-up in 2 weeks if no improved at which point we can consider an injection.  De Quervain's Tenosynovitis De Quervain's tenosynovitis involves inflammation of one or two tendon linings (sheaths) or strain of one or two tendons to the thumb: extensor pollicis brevis (EPB), or abductor pollicis longus (APL). This causes pain on the side of the wrist and base of the thumb. Tendon sheaths secrete a fluid that lubricates the tendon, allowing the tendon to move smoothly. When the sheath becomes inflamed, the tendon cannot move freely in the sheath. Both the EPB and APL tendons are important for proper use of the hand. The EPB tendon is important for straightening the thumb. The APL tendon is important for moving the thumb away from the index finger (abducting). The two tendons pass through a small tube (canal) in the wrist, near the base of the thumb. When the tendons become inflamed, pain is usually felt in this area. SYMPTOMS   Pain, tenderness, swelling, warmth, or redness over the base of the thumb and thumb side of the wrist.  Pain that gets worse when straightening the thumb.  Pain that gets worse when moving the thumb away from the index finger, against resistance.  Pain with pinching or gripping.  Locking or catching of the thumb.  Limited motion of the thumb.  Crackling sound (crepitation) when the tendon or thumb is moved or touched.  Fluid-filled cyst in the area of the base of the thumb. CAUSES   Tenosynovitis is often linked with overuse of the wrist.  Tenosynovitis may be caused by repeated injury to the thumb muscle and tendon units, and with repeated motions of the hand and wrist, due to friction of  the tendon within the lining (sheath).  Tenosynovitis may also be due to a sudden increase in activity or change in activity. RISK INCREASES WITH:  Sports that involve repeated hand and wrist motions (golf, bowling, tennis, squash, racquetball).  Heavy labor.  Poor physical wrist strength and flexibility.  Failure to warm up properly before practice or play.  Female gender.  New mothers who hold their baby's head for long periods or lift infants with thumbs in the infant's armpit (axilla). PREVENTION  Warm up and stretch properly before practice or competition.  Allow enough time for rest and recovery between practices and competition.  Maintain appropriate conditioning:  Cardiovascular fitness.  Forearm, wrist, and hand flexibility.  Muscle strength and endurance.  Use proper exercise technique. PROGNOSIS  This condition is usually curable within 6 weeks, if treated properly with non-surgical treatment and resting of the affected area.  RELATED COMPLICATIONS   Longer healing time if not properly treated or if not given enough time to heal.  Chronic inflammation, causing recurring symptoms of tenosynovitis. Permanent pain or restriction of movement.  Risks of surgery: infection, bleeding, injury to nerves (numbness of the thumb), continued pain, incomplete release of the tendon sheath, recurring symptoms, cutting of the tendons, tendons sliding out of position, weakness of the thumb, thumb stiffness. TREATMENT  First, treatment involves the use of medicine and ice, to reduce pain and inflammation. Patients are encouraged to stop or modify activities that aggravate the injury. Stretching and strengthening  exercises may be advised. Exercises may be completed at home or with a therapist. You may be fitted with a brace or splint, to limit motion and allow the injury to heal. Your caregiver may also choose to give you a corticosteroid injection, to reduce the pain and  inflammation. If non-surgical treatment is not successful, surgery may be needed. Most tenosynovitis surgeries are done as outpatient procedures (you go home the same day). Surgery may involve local, regional (whole arm), or general anesthesia.  MEDICATION   If pain medicine is needed, nonsteroidal anti-inflammatory medicines (aspirin and ibuprofen), or other minor pain relievers (acetaminophen), are often advised.  Do not take pain medicine for 7 days before surgery.  Prescription pain relievers are often prescribed only after surgery. Use only as directed and only as much as you need.  Corticosteroid injections may be given if your caregiver thinks they are needed. There is a limited number of times these injections may be given. COLD THERAPY   Cold treatment (icing) should be applied for 10 to 15 minutes every 2 to 3 hours for inflammation and pain, and immediately after activity that aggravates your symptoms. Use ice packs or an ice massage. SEEK MEDICAL CARE IF:   Symptoms get worse or do not improve in 2 to 4 weeks, despite treatment.  You experience pain, numbness, or coldness in the hand.  Blue, gray, or dark color appears in the fingernails.  Any of the following occur after surgery: increased pain, swelling, redness, drainage of fluids, bleeding in the affected area, or signs of infection.  New, unexplained symptoms develop. (Drugs used in treatment may produce side effects.) Document Released: 08/18/2005 Document Revised: 11/10/2011 Document Reviewed: 11/30/2008 [facility] Patient Information 2015 [place], [place]. This information is not intended to replace advice given to you by your health care provider. Make sure you discuss any questions you have with your health care provider.

## 2014-11-10 ENCOUNTER — Encounter: Payer: Self-pay | Admitting: Family Medicine

## 2014-11-14 ENCOUNTER — Telehealth: Payer: Self-pay | Admitting: *Deleted

## 2014-11-14 NOTE — Telephone Encounter (Signed)
Tried calling pt but phone kept ringing and ringing and no VM set-up. Will try again later. Mi Balla, CMA.

## 2014-11-14 NOTE — Telephone Encounter (Signed)
-----   Message from Leone Haven, MD sent at 11/10/2014 10:37 AM EST ----- Patient requested refill of cough syrup during same day visit with Verdis Frederickson. And maria deferred to me. Please call patient and inform her she needs to follow-up if she wants this refilled. Thanks.   Randall Hiss

## 2014-11-21 NOTE — Telephone Encounter (Signed)
Tried calling pt but phone kept ringing and ringing and no VM set-up. Will try again later. Felice Deem, CMA.

## 2014-11-22 NOTE — Telephone Encounter (Signed)
LMTCB with family member as pt was not at home. Will try again later. Mylo Choi, CMA.

## 2014-11-23 ENCOUNTER — Encounter: Payer: Self-pay | Admitting: *Deleted

## 2014-11-23 NOTE — Telephone Encounter (Signed)
Mailed letter today due to no return call back from pt. Kerrin Markman, CMA.

## 2014-12-09 ENCOUNTER — Other Ambulatory Visit: Payer: Self-pay | Admitting: Family Medicine

## 2014-12-14 ENCOUNTER — Encounter: Payer: Self-pay | Admitting: Family Medicine

## 2014-12-14 ENCOUNTER — Ambulatory Visit (INDEPENDENT_AMBULATORY_CARE_PROVIDER_SITE_OTHER): Payer: Medicare Other | Admitting: Family Medicine

## 2014-12-14 VITALS — BP 149/94 | HR 87 | Temp 98.8°F | Ht 66.9 in | Wt 168.0 lb

## 2014-12-14 DIAGNOSIS — J441 Chronic obstructive pulmonary disease with (acute) exacerbation: Secondary | ICD-10-CM | POA: Diagnosis not present

## 2014-12-14 DIAGNOSIS — J42 Unspecified chronic bronchitis: Secondary | ICD-10-CM | POA: Diagnosis not present

## 2014-12-14 DIAGNOSIS — M654 Radial styloid tenosynovitis [de Quervain]: Secondary | ICD-10-CM

## 2014-12-14 DIAGNOSIS — K219 Gastro-esophageal reflux disease without esophagitis: Secondary | ICD-10-CM | POA: Diagnosis not present

## 2014-12-14 DIAGNOSIS — J209 Acute bronchitis, unspecified: Secondary | ICD-10-CM | POA: Diagnosis not present

## 2014-12-14 MED ORDER — ALBUTEROL SULFATE HFA 108 (90 BASE) MCG/ACT IN AERS: 2.0000 | INHALATION_SPRAY | RESPIRATORY_TRACT | Status: DC | PRN

## 2014-12-14 MED ORDER — FORMOTEROL FUMARATE 12 MCG IN CAPS: 12.0000 ug | ORAL_CAPSULE | Freq: Two times a day (BID) | RESPIRATORY_TRACT | Status: DC

## 2014-12-14 MED ORDER — NAPROXEN 500 MG PO TABS: 500.0000 mg | ORAL_TABLET | Freq: Two times a day (BID) | ORAL | Status: DC

## 2014-12-14 MED ORDER — GUAIFENESIN-CODEINE 100-10 MG/5ML PO SYRP: 5.0000 mL | ORAL_SOLUTION | Freq: Three times a day (TID) | ORAL | Status: DC | PRN

## 2014-12-14 MED ORDER — AZITHROMYCIN 250 MG PO TABS: ORAL_TABLET | ORAL | Status: DC

## 2014-12-14 MED ORDER — OMEPRAZOLE 20 MG PO CPDR: 20.0000 mg | DELAYED_RELEASE_CAPSULE | Freq: Every day | ORAL | Status: DC

## 2014-12-14 MED ORDER — METHYLPREDNISOLONE ACETATE 80 MG/ML IJ SUSP
40.0000 mg | Freq: Once | INTRAMUSCULAR | Status: AC
  Administered 2014-12-14: 40 mg via INTRA_ARTICULAR

## 2014-12-14 NOTE — Progress Notes (Signed)
    Subjective:    Patient ID: [name] is a 64 y.o. female presenting with left wrist pain and Cough  on 12/14/2014  HPI: Here 3 wks ago with wrist pain. Left sided and swelling.  Aches like a toothache. Reports cold and cough--has long h/o smoking.  Review of Systems  Constitutional: Negative for fever and chills.  Respiratory: Negative for shortness of breath.   Cardiovascular: Negative for chest pain.  Gastrointestinal: Negative for nausea, vomiting and abdominal pain.  Genitourinary: Negative for dysuria.  Skin: Negative for rash.      Objective:    BP 149/94 mmHg  Pulse 87  Temp(Src) 98.8 F (37.1 C) (Oral)  Ht 5' 6.9" (1.699 m)  Wt 168 lb (76.204 kg)  BMI 26.40 kg/m2  SpO2 99% Physical Exam  Constitutional: She is oriented to person, place, and time. She appears well-developed and well-nourished. No distress.  HENT:  Head: Normocephalic and atraumatic.  Eyes: No scleral icterus.  Neck: Neck supple.  Cardiovascular: Normal rate.   Pulmonary/Chest: Effort normal. She has wheezes.  Abdominal: Soft.  Musculoskeletal: She exhibits edema and tenderness.  Left wrist with swelling and tenderness  Neurological: She is alert and oriented to person, place, and time.  Skin: Skin is warm and dry.  Psychiatric: She has a normal mood and affect.   Procedure: Verbal and written consent obtained. Left wrist placed in neutral position. Tendon's identified. Injection of 1/2 cc depomedrol and 1 1/2 cc 1% Lidocaine injected easily.  Pt. Tolerated it well.     Assessment & Plan:   Problem List Items Addressed This Visit      Unprioritized   GASTROESOPHAGEAL REFLUX DISEASE   Relevant Medications   omeprazole (PRILOSEC) 20 MG capsule   De Quervain's tenosynovitis, left - Primary   Relevant Medications   naproxen (NAPROSYN) 500 MG tablet   methylPREDNISolone acetate (DEPO-MEDROL) injection 40 mg (Completed) (Start on 12/14/2014  4:45 PM)    Other Visit Diagnoses    COPD exacerbation        Relevant Medications    guaiFENesin-codeine (ROBITUSSIN AC) 100-10 MG/5ML syrup    albuterol (PROAIR HFA) 108 (90 BASE) MCG/ACT inhaler    formoterol (FORADIL) 12 MCG capsule for inhaler    azithromycin (ZITHROMAX) 250 MG tablet    methylPREDNISolone acetate (DEPO-MEDROL) injection 40 mg (Completed) (Start on 12/14/2014  4:45 PM)    Chronic bronchitis with acute exacerbation        Treat with Abx, and refill inhalers    Relevant Medications    guaiFENesin-codeine (ROBITUSSIN AC) 100-10 MG/5ML syrup    albuterol (PROAIR HFA) 108 (90 BASE) MCG/ACT inhaler    formoterol (FORADIL) 12 MCG capsule for inhaler    azithromycin (ZITHROMAX) 250 MG tablet    methylPREDNISolone acetate (DEPO-MEDROL) injection 40 mg (Completed) (Start on 12/14/2014  4:45 PM)       [name] S 12/14/2014 4:05 PM

## 2014-12-14 NOTE — Patient Instructions (Signed)
De Quervain's Tenosynovitis De Quervain's tenosynovitis involves inflammation of one or two tendon linings (sheaths) or strain of one or two tendons to the thumb: extensor pollicis brevis (EPB), or abductor pollicis longus (APL). This causes pain on the side of the wrist and base of the thumb. Tendon sheaths secrete a fluid that lubricates the tendon, allowing the tendon to move smoothly. When the sheath becomes inflamed, the tendon cannot move freely in the sheath. Both the EPB and APL tendons are important for proper use of the hand. The EPB tendon is important for straightening the thumb. The APL tendon is important for moving the thumb away from the index finger (abducting). The two tendons pass through a small tube (canal) in the wrist, near the base of the thumb. When the tendons become inflamed, pain is usually felt in this area. SYMPTOMS   Pain, tenderness, swelling, warmth, or redness over the base of the thumb and thumb side of the wrist.  Pain that gets worse when straightening the thumb.  Pain that gets worse when moving the thumb away from the index finger, against resistance.  Pain with pinching or gripping.  Locking or catching of the thumb.  Limited motion of the thumb.  Crackling sound (crepitation) when the tendon or thumb is moved or touched.  Fluid-filled cyst in the area of the base of the thumb. CAUSES   Tenosynovitis is often linked with overuse of the wrist.  Tenosynovitis may be caused by repeated injury to the thumb muscle and tendon units, and with repeated motions of the hand and wrist, due to friction of the tendon within the lining (sheath).  Tenosynovitis may also be due to a sudden increase in activity or change in activity. RISK INCREASES WITH:  Sports that involve repeated hand and wrist motions (golf, bowling, tennis, squash, racquetball).  Heavy labor.  Poor physical wrist strength and flexibility.  Failure to warm up properly before practice or  play.  Female gender.  New mothers who hold their baby's head for long periods or lift infants with thumbs in the infant's armpit (axilla). PREVENTION  Warm up and stretch properly before practice or competition.  Allow enough time for rest and recovery between practices and competition.  Maintain appropriate conditioning:  Cardiovascular fitness.  Forearm, wrist, and hand flexibility.  Muscle strength and endurance.  Use proper exercise technique. PROGNOSIS  This condition is usually curable within 6 weeks, if treated properly with non-surgical treatment and resting of the affected area.  RELATED COMPLICATIONS   Longer healing time if not properly treated or if not given enough time to heal.  Chronic inflammation, causing recurring symptoms of tenosynovitis. Permanent pain or restriction of movement.  Risks of surgery: infection, bleeding, injury to nerves (numbness of the thumb), continued pain, incomplete release of the tendon sheath, recurring symptoms, cutting of the tendons, tendons sliding out of position, weakness of the thumb, thumb stiffness. TREATMENT  First, treatment involves the use of medicine and ice, to reduce pain and inflammation. Patients are encouraged to stop or modify activities that aggravate the injury. Stretching and strengthening exercises may be advised. Exercises may be completed at home or with a therapist. You may be fitted with a brace or splint, to limit motion and allow the injury to heal. Your caregiver may also choose to give you a corticosteroid injection, to reduce the pain and inflammation. If non-surgical treatment is not successful, surgery may be needed. Most tenosynovitis surgeries are done as outpatient procedures (you go home the   same day). Surgery may involve local, regional (whole arm), or general anesthesia.  MEDICATION   If pain medicine is needed, nonsteroidal anti-inflammatory medicines (aspirin and ibuprofen), or other minor pain  relievers (acetaminophen), are often advised.  Do not take pain medicine for 7 days before surgery.  Prescription pain relievers are often prescribed only after surgery. Use only as directed and only as much as you need.  Corticosteroid injections may be given if your caregiver thinks they are needed. There is a limited number of times these injections may be given. COLD THERAPY   Cold treatment (icing) should be applied for 10 to 15 minutes every 2 to 3 hours for inflammation and pain, and immediately after activity that aggravates your symptoms. Use ice packs or an ice massage. SEEK MEDICAL CARE IF:   Symptoms get worse or do not improve in 2 to 4 weeks, despite treatment.  You experience pain, numbness, or coldness in the hand.  Blue, gray, or dark color appears in the fingernails.  Any of the following occur after surgery: increased pain, swelling, redness, drainage of fluids, bleeding in the affected area, or signs of infection.  New, unexplained symptoms develop. (Drugs used in treatment may produce side effects.) Document Released: 08/18/2005 Document Revised: 11/10/2011 Document Reviewed: 11/30/2008 [facility] Patient Information 2015 [place], [place]. This information is not intended to replace advice given to you by your health care provider. Make sure you discuss any questions you have with your health care provider. Chronic Obstructive Pulmonary Disease Chronic obstructive pulmonary disease (COPD) is a common lung condition in which airflow from the lungs is limited. COPD is a general term that can be used to describe many different lung problems that limit airflow, including both chronic bronchitis and emphysema. If you have COPD, your lung function will probably never return to normal, but there are measures you can take to improve lung function and make yourself feel better.  CAUSES   Smoking (common).   Exposure to secondhand smoke.   Genetic problems.  Chronic  inflammatory lung diseases or recurrent infections. SYMPTOMS   Shortness of breath, especially with physical activity.   Deep, persistent (chronic) cough with a large amount of thick mucus.   Wheezing.   Rapid breaths (tachypnea).   Gray or bluish discoloration (cyanosis) of the skin, especially in fingers, toes, or lips.   Fatigue.   Weight loss.   Frequent infections or episodes when breathing symptoms become much worse (exacerbations).   Chest tightness. DIAGNOSIS  Your health care provider will take a medical history and perform a physical examination to make the initial diagnosis. Additional tests for COPD may include:   Lung (pulmonary) function tests.  Chest X-ray.  CT scan.  Blood tests. TREATMENT  Treatment available to help you feel better when you have COPD includes:   Inhaler and nebulizer medicines. These help manage the symptoms of COPD and make your breathing more comfortable.  Supplemental oxygen. Supplemental oxygen is only helpful if you have a low oxygen level in your blood.   Exercise and physical activity. These are beneficial for nearly all people with COPD. Some people may also benefit from a pulmonary rehabilitation program. HOME CARE INSTRUCTIONS   Take all medicines (inhaled or pills) as directed by your health care provider.  Avoid over-the-counter medicines or cough syrups that dry up your airway (such as antihistamines) and slow down the elimination of secretions unless instructed otherwise by your health care provider.   If you are a smoker, the most  important thing that you can do is stop smoking. Continuing to smoke will cause further lung damage and breathing trouble. Ask your health care provider for help with quitting smoking. He or she can direct you to community resources or hospitals that provide support.  Avoid exposure to irritants such as smoke, chemicals, and fumes that aggravate your breathing.  Use oxygen therapy  and pulmonary rehabilitation if directed by your health care provider. If you require home oxygen therapy, ask your health care provider whether you should purchase a pulse oximeter to measure your oxygen level at home.   Avoid contact with individuals who have a contagious illness.  Avoid extreme temperature and humidity changes.  Eat healthy foods. Eating smaller, more frequent meals and resting before meals may help you maintain your strength.  Stay active, but balance activity with periods of rest. Exercise and physical activity will help you maintain your ability to do things you want to do.  Preventing infection and hospitalization is very important when you have COPD. Make sure to receive all the vaccines your health care provider recommends, especially the pneumococcal and influenza vaccines. Ask your health care provider whether you need a pneumonia vaccine.  Learn and use relaxation techniques to manage stress.  Learn and use controlled breathing techniques as directed by your health care provider. Controlled breathing techniques include:   Pursed lip breathing. Start by breathing in (inhaling) through your nose for 1 second. Then, purse your lips as if you were going to whistle and breathe out (exhale) through the pursed lips for 2 seconds.   Diaphragmatic breathing. Start by putting one hand on your abdomen just above your waist. Inhale slowly through your nose. The hand on your abdomen should move out. Then purse your lips and exhale slowly. You should be able to feel the hand on your abdomen moving in as you exhale.   Learn and use controlled coughing to clear mucus from your lungs. Controlled coughing is a series of short, progressive coughs. The steps of controlled coughing are:   Lean your head slightly forward.   Breathe in deeply using diaphragmatic breathing.   Try to hold your breath for 3 seconds.   Keep your mouth slightly open while coughing twice.    Spit any mucus out into a tissue.   Rest and repeat the steps once or twice as needed. SEEK MEDICAL CARE IF:   You are coughing up more mucus than usual.   There is a change in the color or thickness of your mucus.   Your breathing is more labored than usual.   Your breathing is faster than usual.  SEEK IMMEDIATE MEDICAL CARE IF:   You have shortness of breath while you are resting.   You have shortness of breath that prevents you from:  Being able to talk.   Performing your usual physical activities.   You have chest pain lasting longer than 5 minutes.   Your skin color is more cyanotic than usual.  You measure low oxygen saturations for longer than 5 minutes with a pulse oximeter. MAKE SURE YOU:   Understand these instructions.  Will watch your condition.  Will get help right away if you are not doing well or get worse. Document Released: 05/28/2005 Document Revised: 01/02/2014 Document Reviewed: 04/14/2013 [facility] Patient Information 2015 [place], [place]. This information is not intended to replace advice given to you by your health care provider. Make sure you discuss any questions you have with your health care provider.

## 2015-02-16 ENCOUNTER — Other Ambulatory Visit: Payer: Self-pay | Admitting: Family Medicine

## 2015-03-07 ENCOUNTER — Other Ambulatory Visit: Payer: Self-pay | Admitting: Family Medicine

## 2015-03-14 DIAGNOSIS — Z23 Encounter for immunization: Secondary | ICD-10-CM | POA: Diagnosis not present

## 2015-05-23 DIAGNOSIS — H2513 Age-related nuclear cataract, bilateral: Secondary | ICD-10-CM | POA: Diagnosis not present

## 2015-05-23 DIAGNOSIS — H524 Presbyopia: Secondary | ICD-10-CM | POA: Diagnosis not present

## 2015-05-23 DIAGNOSIS — H0014 Chalazion left upper eyelid: Secondary | ICD-10-CM | POA: Diagnosis not present

## 2015-05-28 DIAGNOSIS — Z23 Encounter for immunization: Secondary | ICD-10-CM | POA: Diagnosis not present

## 2015-06-04 ENCOUNTER — Other Ambulatory Visit: Payer: Self-pay | Admitting: Family Medicine

## 2015-09-07 ENCOUNTER — Other Ambulatory Visit: Payer: Self-pay | Admitting: Family Medicine

## 2015-09-10 NOTE — Telephone Encounter (Signed)
Patient is no longer a patient of mine. This refill needs to be sent to her PCP at [facility]. Thanks.

## 2015-09-10 NOTE — Telephone Encounter (Signed)
Pt last refilled 12/11/14 #30 with 3 refills, Last OV 05/16/14. Please advise/tvw

## 2015-09-10 NOTE — Telephone Encounter (Signed)
Called pt and advised pt to see PCP for new Rx, see Dr. Ellen Henri note.

## 2015-09-17 NOTE — Telephone Encounter (Signed)
Pt is a pt at [facility]/ Dr Dallas Schimke Please forward refill request to her

## 2015-09-17 NOTE — Telephone Encounter (Signed)
Will forward to MD. Brylinn Teaney,CMA  

## 2015-09-18 MED ORDER — PROMETHAZINE HCL 25 MG PO TABS: 25.0000 mg | ORAL_TABLET | Freq: Four times a day (QID) | ORAL | Status: DC | PRN

## 2015-09-18 NOTE — Addendum Note (Signed)
Addended by: Smiley Houseman on: 09/18/2015 02:33 AM   Modules accepted: Orders

## 2015-09-18 NOTE — Telephone Encounter (Signed)
Per chart review, her nausea seems to be a chronic issue. However, she was last seen for this in 2014. Please ask patient to make an appointment in clinic for this issue. I have sent in a refill for 10 tablets of phenergan.

## 2015-09-18 NOTE — Telephone Encounter (Signed)
Patient has an appt on 09/28/15 to see pcp. Zeinab Rodwell,CMA

## 2015-09-28 ENCOUNTER — Ambulatory Visit (INDEPENDENT_AMBULATORY_CARE_PROVIDER_SITE_OTHER): Payer: Medicare Other | Admitting: Internal Medicine

## 2015-09-28 ENCOUNTER — Encounter: Payer: Self-pay | Admitting: Internal Medicine

## 2015-09-28 VITALS — BP 130/70 | HR 76 | Temp 97.6°F | Ht 67.0 in | Wt 170.0 lb

## 2015-09-28 DIAGNOSIS — R0601 Orthopnea: Secondary | ICD-10-CM | POA: Diagnosis not present

## 2015-09-28 DIAGNOSIS — R1310 Dysphagia, unspecified: Secondary | ICD-10-CM | POA: Diagnosis present

## 2015-09-28 DIAGNOSIS — R0609 Other forms of dyspnea: Secondary | ICD-10-CM

## 2015-09-28 DIAGNOSIS — G43A0 Cyclical vomiting, not intractable: Secondary | ICD-10-CM | POA: Diagnosis not present

## 2015-09-28 DIAGNOSIS — J209 Acute bronchitis, unspecified: Secondary | ICD-10-CM | POA: Diagnosis not present

## 2015-09-28 DIAGNOSIS — J42 Unspecified chronic bronchitis: Secondary | ICD-10-CM | POA: Diagnosis not present

## 2015-09-28 DIAGNOSIS — Z1211 Encounter for screening for malignant neoplasm of colon: Secondary | ICD-10-CM

## 2015-09-28 DIAGNOSIS — R1115 Cyclical vomiting syndrome unrelated to migraine: Secondary | ICD-10-CM

## 2015-09-28 DIAGNOSIS — J441 Chronic obstructive pulmonary disease with (acute) exacerbation: Secondary | ICD-10-CM | POA: Diagnosis not present

## 2015-09-28 HISTORY — DX: Orthopnea: R06.01

## 2015-09-28 MED ORDER — PREDNISONE 20 MG PO TABS: 20.0000 mg | ORAL_TABLET | Freq: Every day | ORAL | Status: DC

## 2015-09-28 MED ORDER — ALBUTEROL SULFATE HFA 108 (90 BASE) MCG/ACT IN AERS: INHALATION_SPRAY | RESPIRATORY_TRACT | Status: DC

## 2015-09-28 MED ORDER — FORMOTEROL FUMARATE 12 MCG IN CAPS: 12.0000 ug | ORAL_CAPSULE | Freq: Two times a day (BID) | RESPIRATORY_TRACT | Status: DC

## 2015-09-28 MED ORDER — CLOBETASOL PROPIONATE 0.05 % EX OINT
1.0000 | TOPICAL_OINTMENT | Freq: Two times a day (BID) | CUTANEOUS | Status: DC
Start: 2015-09-28 — End: 2016-04-11

## 2015-09-28 MED ORDER — AZITHROMYCIN 250 MG PO TABS: ORAL_TABLET | ORAL | Status: AC

## 2015-09-28 MED ORDER — GUAIFENESIN-CODEINE 100-10 MG/5ML PO SYRP: ORAL_SOLUTION | ORAL | Status: DC

## 2015-09-28 MED ORDER — PROMETHAZINE HCL 25 MG PO TABS: 25.0000 mg | ORAL_TABLET | Freq: Four times a day (QID) | ORAL | Status: DC | PRN

## 2015-09-28 NOTE — Patient Instructions (Addendum)
Your cold symptoms are likely due to a flare of your COPD. I have prescribed antibiotic (azithromycin) for 5 days and also steroid medicine for 5 days. I also refilled Guaifenesin which is a medication for your cough. I refilled your inhalers and your cream for psoriasis.    For your nausea and vomiting, I made a referral to the GI doctors. I also made a referral to get a colonoscopy done to screen for colon cancer.   I also ordered an Echocardiogram which is an ultrasound of your heart since you have been having shortness of breath with walking and also shortness of breath with laying down.   Please make a follow up appointment in 1 to 2 weeks or sooner if you have concerns. If your symptoms worsen, please seek medical care immediately.

## 2015-09-28 NOTE — Progress Notes (Signed)
Patient ID: [name], female   DOB: [birth year], 65 y.o.   MRN: [id number] Date of Visit: 09/28/2015   HPI: Recurrent Nausea Vomiting:  Patient has a history of cyclic vomiting and history of H. Pylori x 2 for which she was adequately treated. Patient presents today for Nausea and vomiting for the past 2 weeks. Patient states :I am not able to keep anything down" but upon clarification, patient has been able to tolerate soda and some foods such as creamed potatoes and string beans. State that she has intermittent difficulty swallowing meat, apples/oranges. She continues to have intermittent dull pain/cramping around her navel which is chronic issue. She has been having some non-bloody diarrhea for the past 3-4 days. No sick contacts. She states that her symptoms are usually controlled with phenergan but recently it has not been helping.   Cold Symptoms: Patient notes of increased cough with white sputum production, nasal congestion for the past 3 weeks. She usually does not produce sputum with cough. Notes of dyspnea on exertion for the past year which worsens when she gets cold symptoms. She has been using her albuterol 3 times a day since she got sick. She notes of orthopnea which started around the same time with other symptoms; states she needs 5 pillows (from her usual 2-3 pillows) because she feels short of breath with laying flat. No chest pain.   ROS: See HPI.  [place]:  Smoking history: for 40 years; currently on ly smokes 3 cigarettes/day   PHYSICAL EXAM: BP 130/70 mmHg  Pulse 76  Temp(Src) 97.6 F (36.4 C) (Oral)  Ht 5\' 7"  (1.702 m)  Wt 170 lb (77.111 kg)  BMI 26.62 kg/m2 Gen: NAD HEENT: some erythema of posterior pharynx; moist mucous membranes; nasal turbinates unremarkable Neck: no JVD Heart:  RRR, no m/r/g Lungs: normal effort, CTAB bilaterally, rare wheezing bilaterally Ext: no LE edema  ASSESSMENT/PLAN: COPD Mild COPD exacerbation with increased cough and new sputum  production and increased need for albuterol inhaler. Patient is afebrile, normal respiratory effort, lungs CTAB with rare wheezes bilaterally. Will prescribe Azithromycin and Prednisone 20mg  x 5 days. Given return precautions. If symptoms do not improve or worsen even with antibiotics, will obtain CXR.    Cyclical vomiting Recurrent episode. Patient able to tolerate some foods and fluids. Patient does not look dehydrated clinically. Patient does have history of h. Pylori x 2. She also mentioned of intermitting difficulty swallowing some foods. Due to this will refer to GI with recommendations to re-evaluate for H. Pylori (along with referral for screening colonoscopy).  - Given phenergan and return precautions    FOLLOW UP: Follow up in1-2 weeks to determine if symptoms resolve with antibiotic and steroids and to discuss ECHO results.   Smiley Houseman, MD PGY [place]

## 2015-09-28 NOTE — Assessment & Plan Note (Signed)
Mild COPD exacerbation with increased cough and new sputum production and increased need for albuterol inhaler. Patient is afebrile, normal respiratory effort, lungs CTAB with rare wheezes bilaterally. Will prescribe Azithromycin and Prednisone 20mg  x 5 days. Given return precautions. If symptoms do not improve or worsen even with antibiotics, will obtain CXR.

## 2015-09-28 NOTE — Assessment & Plan Note (Addendum)
Recurrent episode. Patient able to tolerate some foods and fluids. Patient does not look dehydrated clinically. Patient does have history of h. Pylori x 2. She also mentioned of intermitting difficulty swallowing some foods. Due to this will refer to GI with recommendations to re-evaluate for H. Pylori (along with referral for screening colonoscopy).  - Given phenergan and return precautions

## 2015-10-10 ENCOUNTER — Other Ambulatory Visit: Payer: Self-pay

## 2015-10-10 ENCOUNTER — Ambulatory Visit (HOSPITAL_COMMUNITY): Payer: Medicare Other | Attending: Cardiovascular Disease

## 2015-10-10 DIAGNOSIS — I253 Aneurysm of heart: Secondary | ICD-10-CM | POA: Insufficient documentation

## 2015-10-10 DIAGNOSIS — I517 Cardiomegaly: Secondary | ICD-10-CM | POA: Diagnosis not present

## 2015-10-10 DIAGNOSIS — R0609 Other forms of dyspnea: Secondary | ICD-10-CM | POA: Diagnosis not present

## 2015-10-10 DIAGNOSIS — R0601 Orthopnea: Secondary | ICD-10-CM | POA: Diagnosis not present

## 2015-10-10 DIAGNOSIS — R0600 Dyspnea, unspecified: Secondary | ICD-10-CM | POA: Diagnosis present

## 2015-10-10 DIAGNOSIS — F17200 Nicotine dependence, unspecified, uncomplicated: Secondary | ICD-10-CM | POA: Insufficient documentation

## 2015-12-26 ENCOUNTER — Other Ambulatory Visit: Payer: Self-pay | Admitting: Internal Medicine

## 2015-12-26 NOTE — Telephone Encounter (Signed)
Last RF for medication requested 09/28/2015, pt was also seen on this date in clinic. Please advise.

## 2015-12-28 NOTE — Telephone Encounter (Signed)
Pt informed of rx refill.  

## 2016-02-13 ENCOUNTER — Other Ambulatory Visit: Payer: Self-pay | Admitting: Internal Medicine

## 2016-04-02 ENCOUNTER — Encounter: Payer: Self-pay | Admitting: Internal Medicine

## 2016-04-02 ENCOUNTER — Ambulatory Visit (INDEPENDENT_AMBULATORY_CARE_PROVIDER_SITE_OTHER): Payer: Commercial Managed Care - HMO | Admitting: Internal Medicine

## 2016-04-02 DIAGNOSIS — M79604 Pain in right leg: Secondary | ICD-10-CM

## 2016-04-02 HISTORY — DX: Pain in right leg: M79.604

## 2016-04-02 MED ORDER — HYDROCODONE-ACETAMINOPHEN 5-325 MG PO TABS: 1.0000 | ORAL_TABLET | Freq: Four times a day (QID) | ORAL | 0 refills | Status: DC | PRN

## 2016-04-02 NOTE — Progress Notes (Signed)
   [facility] Kerrin Mo, MD Phone: [phone]  Reason For Visit: Right Leg Pain   # No hx of trauma or inciting event. Saturday evening started having really bad pain in her toes specifically the second and third digit. Patient at that time thought she had stubbed her toe without noticing. Pain worsened traveling up and down the leg. Now pain currently most significant in the medial right thigh. Per patient is painful to walk. Improved some with sitting and laying down however patient continues to have a lot of pain even then. Patient denies any fevers or chills. No swelling redness or warmth. No back pain. No neck pain. Does indicate that it has kept her up at night. Has taken Tylenol for this pain however states it does not touch it.   Past Medical History - No know hx of heart disease, previously seen by cardiology Reviewed problem list.  Medications- reviewed and updated No additions to family history Social history- patient is a smoker- 40 years of smoking   Objective: BP (!) 140/100   Pulse 75   Temp 98.4 F (36.9 C) (Oral)   Ht 5\' 7"  (1.702 m)   Wt 161 lb 12.8 oz (73.4 kg)   BMI 25.34 kg/m  Gen: NAD, alert, cooperative with exam Right Hip: No abnormalities noted on inspection, no swelling no erythema. Severe Pain with palpation of vastus medialis, no pain along the greater trochanter. Slight pain in the groin. Normal range of motion with flexion and extension, internal and external rotation. 5 out of 5 strength. Left Hip: No abnormalities noted on inspection. No pain with palpation. Normal range of motion. 5 out of 5 strength.  Back: No pain in lower back on palpation, no pain along spinal processes or SI joints. L1-S1 strength equal bilaterally in both legs. 1+ reflexes bilaterally in patellar and Achilles.    Assessment/Plan: See problem based a/p  Right leg pain Acute right leg pain specifically in the medial aspect of the right thigh. Pain  pretty severe on inspection. However no signs of swelling or erythema concerning for infection or clot. Concern for hip pathology thought patient with good ROM on exam versus possibly shingles. - HYDROcodone-acetaminophen (NORCO) 5-325 MG tablet; Take 1 tablet by mouth every 6 (six) hours as needed for moderate pain.  Dispense: 30 tablet; Refill: 0 - DG HIPS BILAT W OR W/O PELVIS 2V; Future - Follow up in 2 days.  - Plan discussed with Dr. Nori Riis

## 2016-04-02 NOTE — Patient Instructions (Addendum)
Provided you with some pain medication. Can take this medication every 6 hours. I will get imaging as well. Please follow up this Friday, April 04, 2016

## 2016-04-03 ENCOUNTER — Ambulatory Visit (HOSPITAL_COMMUNITY)
Admission: RE | Admit: 2016-04-03 | Discharge: 2016-04-03 | Disposition: A | Discharge status: Home / Self Care | Payer: Commercial Managed Care - HMO | Source: Ambulatory Visit | Attending: Family Medicine | Admitting: Family Medicine

## 2016-04-03 DIAGNOSIS — M79604 Pain in right leg: Secondary | ICD-10-CM | POA: Diagnosis not present

## 2016-04-03 DIAGNOSIS — M160 Bilateral primary osteoarthritis of hip: Secondary | ICD-10-CM | POA: Diagnosis not present

## 2016-04-03 NOTE — Assessment & Plan Note (Signed)
Acute right leg pain specifically in the medial aspect of the right thigh. Pain pretty severe on inspection. However no signs of swelling or erythema concerning for infection or clot. Concern for hip pathology thought patient with good ROM on exam versus possibly shingles. - HYDROcodone-acetaminophen (NORCO) 5-325 MG tablet; Take 1 tablet by mouth every 6 (six) hours as needed for moderate pain.  Dispense: 30 tablet; Refill: 0 - DG HIPS BILAT W OR W/O PELVIS 2V; Future - Follow up in 2 days.  - Plan discussed with Dr. Nori Riis

## 2016-04-09 ENCOUNTER — Encounter: Payer: Self-pay | Admitting: Internal Medicine

## 2016-04-09 ENCOUNTER — Telehealth: Payer: Self-pay | Admitting: Internal Medicine

## 2016-04-09 NOTE — Telephone Encounter (Signed)
Tried to call patient regarding the results of her xray. No answering machine available. Will send result in letter

## 2016-04-10 ENCOUNTER — Telehealth: Payer: Self-pay | Admitting: Internal Medicine

## 2016-04-10 NOTE — Telephone Encounter (Signed)
Will forward to Dr. Emmaline Life who saw patient for this complaint. Adaia Matthies,CMA

## 2016-04-10 NOTE — Telephone Encounter (Signed)
Pt would like the results of her x-ray and something for pain. Please advise. Thanks! ep

## 2016-04-10 NOTE — Telephone Encounter (Signed)
Provided results of xray. Patient continues to have hip pain without much improvement. Scheduled patient with a SDA.

## 2016-04-11 ENCOUNTER — Ambulatory Visit (INDEPENDENT_AMBULATORY_CARE_PROVIDER_SITE_OTHER): Payer: Commercial Managed Care - HMO | Admitting: Family Medicine

## 2016-04-11 ENCOUNTER — Inpatient Hospital Stay: Admission: AD | Admit: 2016-04-11 | Payer: Self-pay | Source: Ambulatory Visit | Admitting: Family Medicine

## 2016-04-11 ENCOUNTER — Observation Stay (HOSPITAL_COMMUNITY): Payer: Commercial Managed Care - HMO

## 2016-04-11 ENCOUNTER — Observation Stay (HOSPITAL_COMMUNITY)
Admission: AD | Admit: 2016-04-11 | Discharge: 2016-04-15 | Disposition: A | Discharge status: Home / Self Care | Payer: Commercial Managed Care - HMO | Source: Ambulatory Visit | Attending: Family Medicine | Admitting: Family Medicine

## 2016-04-11 VITALS — BP 122/73 | HR 97 | Temp 97.5°F | Ht 67.0 in | Wt 158.4 lb

## 2016-04-11 DIAGNOSIS — L409 Psoriasis, unspecified: Secondary | ICD-10-CM | POA: Insufficient documentation

## 2016-04-11 DIAGNOSIS — M5126 Other intervertebral disc displacement, lumbar region: Secondary | ICD-10-CM

## 2016-04-11 DIAGNOSIS — Z8619 Personal history of other infectious and parasitic diseases: Secondary | ICD-10-CM | POA: Diagnosis not present

## 2016-04-11 DIAGNOSIS — R2990 Unspecified symptoms and signs involving the nervous system: Secondary | ICD-10-CM

## 2016-04-11 DIAGNOSIS — R1013 Epigastric pain: Secondary | ICD-10-CM | POA: Diagnosis not present

## 2016-04-11 DIAGNOSIS — R202 Paresthesia of skin: Principal | ICD-10-CM | POA: Insufficient documentation

## 2016-04-11 DIAGNOSIS — M79604 Pain in right leg: Secondary | ICD-10-CM

## 2016-04-11 DIAGNOSIS — M25551 Pain in right hip: Secondary | ICD-10-CM | POA: Insufficient documentation

## 2016-04-11 DIAGNOSIS — R200 Anesthesia of skin: Secondary | ICD-10-CM | POA: Diagnosis not present

## 2016-04-11 DIAGNOSIS — M79601 Pain in right arm: Secondary | ICD-10-CM | POA: Diagnosis not present

## 2016-04-11 DIAGNOSIS — F17210 Nicotine dependence, cigarettes, uncomplicated: Secondary | ICD-10-CM | POA: Diagnosis not present

## 2016-04-11 DIAGNOSIS — R262 Difficulty in walking, not elsewhere classified: Secondary | ICD-10-CM | POA: Insufficient documentation

## 2016-04-11 DIAGNOSIS — M79651 Pain in right thigh: Secondary | ICD-10-CM | POA: Diagnosis not present

## 2016-04-11 DIAGNOSIS — R634 Abnormal weight loss: Secondary | ICD-10-CM

## 2016-04-11 DIAGNOSIS — M5136 Other intervertebral disc degeneration, lumbar region: Secondary | ICD-10-CM | POA: Insufficient documentation

## 2016-04-11 DIAGNOSIS — K219 Gastro-esophageal reflux disease without esophagitis: Secondary | ICD-10-CM | POA: Diagnosis not present

## 2016-04-11 DIAGNOSIS — J449 Chronic obstructive pulmonary disease, unspecified: Secondary | ICD-10-CM | POA: Diagnosis not present

## 2016-04-11 DIAGNOSIS — M79606 Pain in leg, unspecified: Secondary | ICD-10-CM | POA: Diagnosis present

## 2016-04-11 DIAGNOSIS — M5127 Other intervertebral disc displacement, lumbosacral region: Secondary | ICD-10-CM | POA: Diagnosis not present

## 2016-04-11 DIAGNOSIS — I70209 Unspecified atherosclerosis of native arteries of extremities, unspecified extremity: Secondary | ICD-10-CM

## 2016-04-11 DIAGNOSIS — G43A0 Cyclical vomiting, not intractable: Secondary | ICD-10-CM | POA: Diagnosis not present

## 2016-04-11 HISTORY — DX: Unspecified symptoms and signs involving the nervous system: R29.90

## 2016-04-11 LAB — CBC
HCT: 38.5 % (ref 36.0–46.0)
Hemoglobin: 12.4 g/dL (ref 12.0–15.0)
MCH: 29.3 pg (ref 26.0–34.0)
MCHC: 32.2 g/dL (ref 30.0–36.0)
MCV: 91 fL (ref 78.0–100.0)
Platelets: 329 K/uL (ref 150–400)
RBC: 4.23 MIL/uL (ref 3.87–5.11)
RDW: 13.5 % (ref 11.5–15.5)
WBC: 6.4 K/uL (ref 4.0–10.5)

## 2016-04-11 LAB — VITAMIN B12: VITAMIN B 12: 510 pg/mL (ref 180–914)

## 2016-04-11 LAB — COMPREHENSIVE METABOLIC PANEL
ALBUMIN: 3.9 g/dL (ref 3.5–5.0)
ALK PHOS: 106 U/L (ref 38–126)
ALT: 8 U/L — AB (ref 14–54)
ANION GAP: 10 (ref 5–15)
AST: 15 U/L (ref 15–41)
BUN: 10 mg/dL (ref 6–20)
CALCIUM: 9.6 mg/dL (ref 8.9–10.3)
CHLORIDE: 103 mmol/L (ref 101–111)
CO2: 25 mmol/L (ref 22–32)
CREATININE: 0.92 mg/dL (ref 0.44–1.00)
GFR calc Af Amer: >60 mL/min (ref >60)
GFR calc non Af Amer: >60 mL/min (ref >60)
GLUCOSE: 99 mg/dL (ref 65–99)
Potassium: 4 mmol/L (ref 3.5–5.1)
SODIUM: 138 mmol/L (ref 135–145)
Total Bilirubin: 0.1 mg/dL — ABNORMAL LOW (ref 0.3–1.2)
Total Protein: 7.5 g/dL (ref 6.5–8.1)

## 2016-04-11 LAB — TSH: TSH: 0.83 u[IU]/mL (ref 0.350–4.500)

## 2016-04-11 MED ORDER — ENOXAPARIN SODIUM 40 MG/0.4ML ~~LOC~~ SOLN
40.0000 mg | SUBCUTANEOUS | Status: DC
  Administered 2016-04-11 – 2016-04-14 (×4): 40 mg via SUBCUTANEOUS
  Filled 2016-04-11 (×4): qty 0.4

## 2016-04-11 MED ORDER — GADOBENATE DIMEGLUMINE 529 MG/ML IV SOLN
15.0000 mL | Freq: Once | INTRAVENOUS | Status: AC
Start: 2016-04-11 — End: 2016-04-11
  Administered 2016-04-11: 15 mL via INTRAVENOUS

## 2016-04-11 MED ORDER — ALBUTEROL SULFATE (2.5 MG/3ML) 0.083% IN NEBU: 2.5000 mg | INHALATION_SOLUTION | RESPIRATORY_TRACT | Status: DC | PRN

## 2016-04-11 MED ORDER — PROMETHAZINE HCL 25 MG PO TABS: 25.0000 mg | ORAL_TABLET | Freq: Four times a day (QID) | ORAL | Status: DC | PRN

## 2016-04-11 MED ORDER — HYDROCODONE-ACETAMINOPHEN 5-325 MG PO TABS
1.0000 | ORAL_TABLET | Freq: Four times a day (QID) | ORAL | Status: DC | PRN
  Administered 2016-04-11 – 2016-04-15 (×12): 1 via ORAL
  Filled 2016-04-11 (×12): qty 1

## 2016-04-11 NOTE — H&P (Signed)
[facility] Admission History and Physical Service Pager: [phone]  Patient name: [name] Medical record number: [id number] Date of birth: Apr 24, 1951 Age: 65 y.o. Gender: female  Primary Care Provider: Smiley Houseman, MD Consultants: none Code Status: FULL  Chief Complaint: Right inner thigh pain and paresthesia  Assessment and Plan: [name] is a 65 y.o. female presenting with right inner thigh pain and paresthesia . PMH is significant for COPD, GERD with history of H. Pylori, Cyclic vomiting, Psoriasis  Right Antero-Medial Thigh Pain and Hip Pain: 1 week history of symptoms. No trauma. 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 Last mammogram in 2014 which was normal. Per patient last colonoscopy 3years ago which was normal.  Reports umbness in medial thigh and lateral right ankle and dorsal aspect of right foot; does not seem to follow a specific dermatome. Reports of long car ride (~12 hours with breaks) late last month; reports of swelling in R medial thigh and right ankle but no swelling/erythema or increased warmth noted on thigh and no calf tenderness on exam. No weakness noted. Admitted for further workup from clinic due to loss of ADLs and patient not feeling safe to go home. CBC and CMP wnl. TSH wnl, Vit B12 wnl.  - admit to med surg, attending Dr. Gwendlyn Deutscher (seen by Dr. Nori Riis and Dr. Gerlean Ren in clinic)  - MRI Lumbar spine - Venous US of LE (specifically the thigh) to rule out DVT with recent history of long travel - Norco q 6hr PRN for pain   COPD: stable and at baseline per patient. Patient is not on a controller medication currently. Per chart review, had been on Spiriva.  - Albuterol PRN   Hx of Cyclic Vomiting: no nausea  or vomiting - home phenergan PRN   FEN/GI: heart healthy, SLIV Prophylaxis: Lovenox  Disposition: admit for further work up   History of Present Illness:  [name] is a 65 y.o. female presenting with right medial thigh pain and paraesthesia. Patient reported symptoms initially started at her right toes specifically second and third digit and she thought she stubbed her toe without noticing; this was about I week ago. The pain worsened and started traveling along her lateral foot and ankle, and then to her medial right thigh and right hip. Reports of some swelling in her right ankle and possibly in her medial thigh. No redness or warmth. No history of trauma. Reports it is painful in her right thigh and hip area to walk. Reports as a throbbing pain in her thigh. Reported that she has been unable to leave the living room due to the pain. Reports numbness in medial thigh and lateral right ankle and dorsal aspect of right foot; does not seem to follow a specific dermatome. Reports of night sweats since June. Reports of weight loss over the past few months (170lb in June, 158 today in clinic). Reports of long car ride to [place] (12 hrs with breaks) that was during the third weekend of June. Dyspnea on exertion is at baseline. Cough is also at baseline.   Review Of Systems: Per HPI Otherwise the remainder of the systems were negative.  Patient Active Problem List   Diagnosis Date Noted  . Abnormal neurological exam 04/11/2016  .  Leg pain, medial 04/11/2016  . Right leg pain 04/02/2016  . Orthopnea 09/28/2015  . De Quervain's tenosynovitis, left 11/09/2014  . Hypertensive retinopathy of right eye, grade 1 05/03/2014  . Hypertensive retinopathy of left eye, grade 2 05/03/2014  . Cataracts, bilateral 05/03/2014  . Headache(784.0) 04/29/2014  . Abdominal pain, unspecified site 08/15/2013  . Shortness of breath 08/10/2013  . Cough 07/06/2013  . Protein-calorie malnutrition, severe ([place])  05/11/2013  . Abdominal pain, epigastric 02/23/2013  . Cyclical vomiting [id number]  . Nausea & vomiting   . Smoker   . Chronic bronchitis ([place]) 12/29/2006  . COPD 12/29/2006  . GASTROESOPHAGEAL REFLUX DISEASE 12/29/2006  . HIATAL HERNIA WITH REFLUX 12/29/2006  . PSORIASIS 12/29/2006    Past Medical History: Past Medical History:  Diagnosis Date  . Asthma   . COPD (chronic obstructive pulmonary disease) ([place])   . Hx of colonic polyps   . Nausea & vomiting   . PSORIASIS 12/29/2006   Qualifier: Diagnosis of  By: Jobe Igo MD, Shanon Brow    . Smoker     Past Surgical History: Past Surgical History:  Procedure Laterality Date  . APPENDECTOMY    . COLONOSCOPY W/ POLYPECTOMY  02/26/2005  . HERNIA REPAIR     x 2  . PARTIAL HYSTERECTOMY    . TONSILLECTOMY    . UPPER GASTROINTESTINAL ENDOSCOPY  03/22/2010, 02/13/2005    Social History: Social History  Substance Use Topics  . Smoking status: Current Some Day Smoker    Packs/day: 0.25    Types: Cigarettes  . Smokeless tobacco: Never Used  . Alcohol use No     Comment: Quit alcohol in 1992   Additional social history:  Please also refer to relevant sections of EMR.  Family History: Family History  Problem Relation Age of Onset  . Cancer Other   . Coronary artery disease Other   . Cancer Mother   . Hypertension Mother   . Heart disease Mother   . Cancer Father   . Hypertension Father   . Cancer Sister   . Hypertension Sister     Allergies and Medications: No Known Allergies No current facility-administered medications on file prior to encounter.    Current Outpatient Prescriptions on File Prior to Encounter  Medication Sig Dispense Refill  . acetaminophen (TYLENOL) 500 MG tablet Take 1,000 mg by mouth every 6 (six) hours as needed for pain.    Marland Kitchen HYDROcodone-acetaminophen (NORCO) 5-325 MG tablet Take 1 tablet by mouth every 6 (six) hours as needed for moderate pain. 30 tablet 0  . PROAIR HFA 108 (90 Base) MCG/ACT inhaler  inhale 2 puffs by mouth every 4 hours if needed for wheezing or shortness of breath 8.5 Inhaler 1  . promethazine (PHENERGAN) 25 MG tablet Take 1 tablet (25 mg total) by mouth every 6 (six) hours as needed for nausea or vomiting. 30 tablet 2    Objective: BP (!) 155/85 (BP Location: Left Arm)   Pulse 82   Temp 97.8 F (36.6 C) (Oral)   Resp 20   SpO2 100%  Exam: GEN: NAD HEENT: Atraumatic, normocephalic, neck supple, EOMI, sclera clear  CV: RRR, no murmurs, rubs, or gallops PULM: CTAB, normal effort ABD: Soft, nontender, nondistended, NABS, no organomegaly SKIN: No rash or cyanosis; warm and well-perfused Spine: no spinal or paraspinal tenderness MSK: tenderness to palpation of ischial tuberosity on the right side. Right LE: tenderness to palpation of R antero-medial thigh. No erythema, warmth, or swelling in this area.  EXTR:  No lower extremity edema or calf tenderness PSYCH: Mood and affect euthymic, normal rate and volume of speech NEURO: Awake, alert, normal speech. 5/5 strength in lower extremities; reports of decreased sensation to light touch over right antero-medial thigh, lateral ankle and dorsal aspect of R foot. Normal patellar reflexes bilaterally .     Labs and Imaging: CBC BMET   Recent Labs Lab 04/11/16 1908  WBC 6.4  HGB 12.4  HCT 38.5  PLT 329    Recent Labs Lab 04/11/16 1908  NA 138  K 4.0  CL 103  CO2 25  BUN 10  CREATININE 0.92  GLUCOSE 99  CALCIUM 9.6     Vit B12: 510 TSH 0.830  Smiley Houseman, MD 04/11/2016, 9:30 PM PGY-2, [place] Intern pager: [phone], text pages welcome

## 2016-04-11 NOTE — Patient Instructions (Signed)
We will directed you to the hospital..

## 2016-04-11 NOTE — Progress Notes (Signed)
Subjective:     Patient ID: [name], female   DOB: 1951-07-11, 65 y.o.   MRN: [id number]  HPI Mrs. Sloane is a 65yo female presenting today for follow up of hip pain. - Previously seen for same on 8/3. No history of trauma. Pain started in second and third digits of right foot and then progressed to right medial thigh. Improved with sitting and lying down but not resolved. Prescription for Norco #30 given. Hip xrays obtained which showed some symmetric osteoarthritis change, no fracture or dislocation. - Unable to leave living room since last office visit except to come here. Unable to carry out any ADLs. Children come over to do the chores and bring her food. - Continues to have right anterior thigh pain, Norco ineffective. Also with pain over inner toes (1-3 digits) and medial ankle - Nights sweats since June - Pain wakes patient up at night - 170pounds in January, 158 today - Pap Smear with CIN-1/LSIL in 2011. Mammogram normal in 2014. Reports Colonoscopy 3 years ago, normal - Reports history of H. Pylori. - Smoker  Review of Systems Per HPI. Other systems negative.    Objective:   Physical Exam  Constitutional: She appears well-developed and well-nourished. No distress.  Cardiovascular: Normal rate and regular rhythm.   No murmur heard. Pulmonary/Chest: Effort normal. No respiratory distress. She has no wheezes.  Abdominal: Soft. She exhibits no distension. There is no tenderness.  Musculoskeletal:  Muscle strength 5/5 in upper and lower extremities. Calf size equal. Negative Homans.  Neurological:  Decreased sensation along right medial thigh. Pain reported along medial thigh, medial ankle, and right phalanges 1-3. DTR intact bilaterally. Unable to discern cold vs. Hot over right extremity; can discern cold vs. Hot in left extremity. Increased vibratory sensation over right extremity. CN II-XII intact.  Skin: No rash noted.  Psychiatric: She has a normal mood and affect. Her  behavior is normal.   No cold sensation in right leg, Increased sensitivity to vibration in right leg, numbness in right medial thigh, muscle strength 5/5, increased tenderness along right piriformis, no midline tenderness    Assessment and Plan:     1. Abnormal neurological exam - Concerning for malignancy with night sweats, decreased weight, intractable pain with nightly awakenings.  - Patient given option of urgent workup as outpatient vs. Inpatient admission and workup. Patient does not feel safe to go home. Will admit to inpatient family medicine teaching service. Inpatient team notified. - Recommend MRI lumbar spine with contrast, B12, CMP, CBC, and TSH - History of smoking. Consider low dose CT chest to screen for lung cancer as inpatient vs. Outpatient. - If workup nonrevealing, consider evaluating for H. Pylori as possible etiology of weight loss. - Will need pap smear at outpatient follow up following discharge. Will also need mammogram. - Admit to Inpatient (patient's expected length of stay will be greater than 2 midnights or inpatient only procedure)  - Precepted with Dr. Nori Riis

## 2016-04-11 NOTE — Progress Notes (Signed)
Patient arrived to 5M01 at 1650 direct admit from Dr. Gwendlyn Deutscher, family medicine.  Patient alert and oriented X4. Vital signs taken and charted. MD notified. Oriented to room with bed alarm on

## 2016-04-12 ENCOUNTER — Encounter (HOSPITAL_COMMUNITY): Payer: Self-pay | Admitting: *Deleted

## 2016-04-12 ENCOUNTER — Observation Stay (HOSPITAL_BASED_OUTPATIENT_CLINIC_OR_DEPARTMENT_OTHER): Payer: Commercial Managed Care - HMO

## 2016-04-12 DIAGNOSIS — R634 Abnormal weight loss: Secondary | ICD-10-CM | POA: Diagnosis not present

## 2016-04-12 DIAGNOSIS — J449 Chronic obstructive pulmonary disease, unspecified: Secondary | ICD-10-CM | POA: Diagnosis not present

## 2016-04-12 DIAGNOSIS — R202 Paresthesia of skin: Secondary | ICD-10-CM | POA: Diagnosis not present

## 2016-04-12 DIAGNOSIS — F17210 Nicotine dependence, cigarettes, uncomplicated: Secondary | ICD-10-CM | POA: Diagnosis not present

## 2016-04-12 DIAGNOSIS — M79601 Pain in right arm: Secondary | ICD-10-CM

## 2016-04-12 DIAGNOSIS — M5127 Other intervertebral disc displacement, lumbosacral region: Secondary | ICD-10-CM | POA: Diagnosis not present

## 2016-04-12 DIAGNOSIS — L409 Psoriasis, unspecified: Secondary | ICD-10-CM | POA: Diagnosis not present

## 2016-04-12 DIAGNOSIS — M79604 Pain in right leg: Secondary | ICD-10-CM | POA: Diagnosis not present

## 2016-04-12 DIAGNOSIS — M5126 Other intervertebral disc displacement, lumbar region: Secondary | ICD-10-CM | POA: Diagnosis not present

## 2016-04-12 DIAGNOSIS — Z8619 Personal history of other infectious and parasitic diseases: Secondary | ICD-10-CM | POA: Diagnosis not present

## 2016-04-12 DIAGNOSIS — R200 Anesthesia of skin: Secondary | ICD-10-CM | POA: Diagnosis not present

## 2016-04-12 DIAGNOSIS — R1013 Epigastric pain: Secondary | ICD-10-CM

## 2016-04-12 DIAGNOSIS — K219 Gastro-esophageal reflux disease without esophagitis: Secondary | ICD-10-CM | POA: Diagnosis not present

## 2016-04-12 DIAGNOSIS — G43A0 Cyclical vomiting, not intractable: Secondary | ICD-10-CM | POA: Diagnosis not present

## 2016-04-12 NOTE — Evaluation (Signed)
Physical Therapy Evaluation Patient Details Name: [name] MRN: [id number] DOB: July 17, 1951 Today's Date: 04/12/2016   History of Present Illness  [name] is a 65 y.o. female presenting with right inner thigh pain and paresthesia . PMH is significant for COPD, GERD with history of H. Pylori, Cyclic vomiting, Psoriasis  Clinical Impression  Patient presents with decreased gait due to R LE pain.  Feel she is safe for d/c home with walker and may benefit from follow up outpatient PT due to pain improves with massage and heat.  Will d/c acute level PT as anticipate d/c home.     Follow Up Recommendations Outpatient PT    Equipment Recommendations  Rolling walker with 5" wheels    Recommendations for Other Services       Precautions / Restrictions Precautions Precautions: Fall      Mobility  Bed Mobility Overal bed mobility: Independent                Transfers Overall transfer level: Independent Equipment used: None                Ambulation/Gait Ambulation/Gait assistance: Supervision;Min guard Ambulation Distance (Feet): 240 Feet Assistive device: None;Straight cane Gait Pattern/deviations: Step-through pattern;Antalgic;Drifts right/left;Decreased stride length     General Gait Details: initially no device and stable gait with mild limp, then increased distance increased limp and pt holding wall rail, then used cane L hand with cues and demo for sequencing with R leg x about 100'  Stairs Stairs: Yes Stairs assistance: Supervision Stair Management: One rail Right;Forwards;With cane;Step to pattern Number of Stairs: 4 General stair comments: cues for sequencing/technique   Wheelchair Mobility    Modified Rankin (Stroke Patients Only)       Balance Overall balance assessment: No apparent balance deficits (not formally assessed)                                           Pertinent Vitals/Pain Pain Assessment: 0-10 Pain  Score: 3  Pain Location: R inner thigh and lateral foot (worse the more we walk) Pain Descriptors / Indicators: Cramping;Tightness;Aching Pain Intervention(s): Repositioned;Monitored during session;Heat applied    Home Living Family/patient expects to be discharged to:: Private residence Living Arrangements: Other relatives (grandaughter stays with her frequently) Available Help at Discharge: Family;Available PRN/intermittently Type of Home: House Home Access: Stairs to enter Entrance Stairs-Rails: Right Entrance Stairs-Number of Steps: 4 Home Layout: One level Home Equipment: None      Prior Function Level of Independence: Independent         Comments: Normally very active, since pain has not gone to church or grocery store     Hand Dominance   Dominant Hand: Right    Extremity/Trunk Assessment   Upper Extremity Assessment: Overall WFL for tasks assessed           Lower Extremity Assessment: RLE deficits/detail RLE Deficits / Details: AROM WFL, strength grossly 4/5 numbness inner thigh and foot       Communication   Communication: No difficulties  Cognition Arousal/Alertness: Awake/alert Behavior During Therapy: WFL for tasks assessed/performed Overall Cognitive Status: Within Functional Limits for tasks assessed                      General Comments General comments (skin integrity, edema, etc.): Educated no follow up PT at this time (as back MRI without signs  of neural impingement); howeverfeel outpatient PT trial indicated since pain relieved by massage and heat.  Also discussed obtaining walker for now due to may need for further distances and pt may purchase cane on her own.    Exercises        Assessment/Plan    PT Assessment All further PT needs can be met in the next venue of care  PT Diagnosis Difficulty walking;Acute pain   PT Problem List Pain;Decreased knowledge of use of DME;Decreased mobility;Decreased activity tolerance;Impaired  sensation  PT Treatment Interventions     PT Goals (Current goals can be found in the Care Plan section) Acute Rehab PT Goals Patient Stated Goal: To go home PT Goal Formulation: All assessment and education complete, DC therapy    Frequency     Barriers to discharge        Co-evaluation               End of Session Equipment Utilized During Treatment: Gait belt Activity Tolerance: Patient tolerated treatment well Patient left: with call bell/phone within reach;in bed      Functional Assessment Tool Used: Clinical Judgement Functional Limitation: Mobility: Walking and moving around Mobility: Walking and Moving Around Current Status (Y3338): At least 1 percent but less than 20 percent impaired, limited or restricted Mobility: Walking and Moving Around Goal Status [phone]): At least 1 percent but less than 20 percent impaired, limited or restricted Mobility: Walking and Moving Around Discharge Status [phone]): At least 1 percent but less than 20 percent impaired, limited or restricted    Time: 0847-0910 PT Time Calculation (min) (ACUTE ONLY): 23 min   Charges:   PT Evaluation $PT Eval Moderate Complexity: 1 Procedure PT Treatments $Gait Training: 8-22 mins   PT G Codes:   PT G-Codes **NOT FOR INPATIENT CLASS** Functional Assessment Tool Used: Clinical Judgement Functional Limitation: Mobility: Walking and moving around Mobility: Walking and Moving Around Current Status (Y0459): At least 1 percent but less than 20 percent impaired, limited or restricted Mobility: Walking and Moving Around Goal Status [phone]): At least 1 percent but less than 20 percent impaired, limited or restricted Mobility: Walking and Moving Around Discharge Status [phone]): At least 1 percent but less than 20 percent impaired, limited or restricted    Reginia Naas 04/12/2016, 9:21 AM  Magda Kiel, [place] 04/12/2016

## 2016-04-12 NOTE — Progress Notes (Signed)
VASCULAR LAB PRELIMINARY  PRELIMINARY  PRELIMINARY  PRELIMINARY  Right lower extremity venous duplex completed.    Preliminary report:  There is no DVT or SVT noted in the right lower extremity.  However, incidentally noted, there appears to be no arterial flow in the distal common femoral artery through to the mid to distal femoral artery with reconstitution noted in the distal femoral artery.  There appears to be flow in the popliteal, posterior tibial, peroneal, and anterior tibial arteries, though it exhibits low velocities and dampened waveforms.   Deyonte Cadden, RVT 04/12/2016, 8:10 PM

## 2016-04-12 NOTE — Progress Notes (Signed)
Family Medicine Teaching Service Daily Progress Note Intern Pager: [phone]  Patient name: [name] Medical record number: [id number] Date of birth: 1951-02-23 Age: 65 y.o. Gender: female  Primary Care Provider: Smiley Houseman, MD Consultants: none Code Status: FULL  Pt Overview and Major Events to Date:  8/11: direct admit from clinic for right thigh pain   Assessment and Plan: [name] is a 65 y.o. female presenting with right inner thigh pain and paresthesia . PMH is significant for COPD, GERD with history of H. Pylori, Cyclic vomiting, Psoriasis  Right Antero-Medial Thigh Pain and Hip Pain: 1 week history of symptoms. No trauma. 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  Concern for malignancy with associated night sweats and weight loss (170lb in Jan and 158lb today in clinic).CBC and CMP wnl. TSH wnl, Vit B12 wnl. MTI Lumbar spine with small central disc protrusion at L5-S1 without impingement; fascet disease in L4-5, and spondylolysis at L2-4 without impingement.  - Venous US of LE (specifically the thigh) to rule out DVT with recent history of long travel - Norco q 6hr PRN for pain  - PT consult: outpatient PT; rolling walker with 5" wheels (ordered)  COPD: stable and at baseline per patient. Patient is not on a controller medication currently. Per chart review, had been on Spiriva.  - Albuterol PRN   Hx of Cyclic Vomiting: no nausea or vomiting - home phenergan PRN   FEN/GI: heart healthy, SLIV Prophylaxis: Lovenox  Disposition:   Subjective:  Reports doing well. Reports pain is better. This pain is intermittent. Worsens with walking or activity. Improves with rest.   Objective: Temp:  [97.5 F (36.4 C)-98.2 F (36.8 C)] 98.2 F (36.8 C) (08/12 0100) Pulse Rate:  [78-97] 78 (08/12 0100) Resp:  [18-20] 18 (08/12 0100) BP: (122-155)/(72-85) 142/76 (08/12 0100) SpO2:  [100 %] 100 % (08/12  0100) Weight:  [71.8 kg (158 lb 6.4 oz)] 71.8 kg (158 lb 6.4 oz) (08/11 1508) Physical Exam: GEN: NAD; prior to exam, patient seemed to ambulate with PT without difficulty CV: RRR, no murmurs, rubs, or gallops PULM: CTAB, normal effort ABD: Soft, nontender, nondistended, NABS, no organomegaly SKIN: No rash or cyanosis; warm and well-perfused EXTR: Right LE: medial thigh tenderness to palpation, no swelling or increased warmth; no other tenderness; no decreased sensation this AM (different from admission)  PSYCH: Mood and affect euthymic, normal rate and volume of speech NEURO: Awake, alert, no focal deficits grossly, normal speech  Laboratory:  Recent Labs Lab 04/11/16 1908  WBC 6.4  HGB 12.4  HCT 38.5  PLT 329    Recent Labs Lab 04/11/16 1908  NA 138  K 4.0  CL 103  CO2 25  BUN 10  CREATININE 0.92  CALCIUM 9.6  PROT 7.5  BILITOT 0.1*  ALKPHOS 106  ALT 8*  AST 15  GLUCOSE 99   Imaging/Diagnostic Tests: MRI Lumbar Spine:  IMPRESSION: 1. Small central disc protrusion at L5-S1, closely approximating the bilateral S1 nerve roots without neural impingement or displacement. 2. Degenerative disc bulge and facet disease at L4-5 with resultant mild canal and bilateral foraminal stenosis without neural impingement. 3. Additional fairly mild degenerative spondylolysis at L2-3 and L3-4 without significant stenosis or evidence of neural impingement.  Smiley Houseman, MD 04/12/2016, 8:31 AM PGY-2, [place] Intern pager: [phone], text pages welcome

## 2016-04-13 ENCOUNTER — Observation Stay (HOSPITAL_COMMUNITY): Payer: Commercial Managed Care - HMO

## 2016-04-13 DIAGNOSIS — M79601 Pain in right arm: Secondary | ICD-10-CM | POA: Diagnosis not present

## 2016-04-13 DIAGNOSIS — K219 Gastro-esophageal reflux disease without esophagitis: Secondary | ICD-10-CM | POA: Diagnosis not present

## 2016-04-13 DIAGNOSIS — L409 Psoriasis, unspecified: Secondary | ICD-10-CM | POA: Diagnosis not present

## 2016-04-13 DIAGNOSIS — I70209 Unspecified atherosclerosis of native arteries of extremities, unspecified extremity: Secondary | ICD-10-CM

## 2016-04-13 DIAGNOSIS — R200 Anesthesia of skin: Secondary | ICD-10-CM | POA: Diagnosis not present

## 2016-04-13 DIAGNOSIS — M5126 Other intervertebral disc displacement, lumbar region: Secondary | ICD-10-CM | POA: Diagnosis not present

## 2016-04-13 DIAGNOSIS — I70211 Atherosclerosis of native arteries of extremities with intermittent claudication, right leg: Secondary | ICD-10-CM | POA: Diagnosis not present

## 2016-04-13 DIAGNOSIS — R634 Abnormal weight loss: Secondary | ICD-10-CM | POA: Diagnosis not present

## 2016-04-13 DIAGNOSIS — R269 Unspecified abnormalities of gait and mobility: Secondary | ICD-10-CM | POA: Diagnosis not present

## 2016-04-13 DIAGNOSIS — M5127 Other intervertebral disc displacement, lumbosacral region: Secondary | ICD-10-CM | POA: Diagnosis not present

## 2016-04-13 DIAGNOSIS — Z8619 Personal history of other infectious and parasitic diseases: Secondary | ICD-10-CM | POA: Diagnosis not present

## 2016-04-13 DIAGNOSIS — I743 Embolism and thrombosis of arteries of the lower extremities: Secondary | ICD-10-CM

## 2016-04-13 DIAGNOSIS — G43A0 Cyclical vomiting, not intractable: Secondary | ICD-10-CM | POA: Diagnosis not present

## 2016-04-13 DIAGNOSIS — J449 Chronic obstructive pulmonary disease, unspecified: Secondary | ICD-10-CM | POA: Diagnosis not present

## 2016-04-13 DIAGNOSIS — R202 Paresthesia of skin: Secondary | ICD-10-CM | POA: Diagnosis not present

## 2016-04-13 DIAGNOSIS — F17210 Nicotine dependence, cigarettes, uncomplicated: Secondary | ICD-10-CM | POA: Diagnosis not present

## 2016-04-13 NOTE — Progress Notes (Signed)
VASCULAR LAB PRELIMINARY  ARTERIAL  ABI completed:Right ABI indicates borderline severe reduction in arterial blood flow to the lower extremities with abnromal waveforms, at rest.  Left ABI indicates normal arterial blood flow and waveforms, at rest.    RIGHT    LEFT    PRESSURE WAVEFORM  PRESSURE WAVEFORM  BRACHIAL 167 T BRACHIAL 160 T  DP   DP    AT 78 M AT 152 T  PT 94 M PT 174 T  PER 152 M PER    POP  M     FEM  M     GREAT TOE  NA GREAT TOE  NA    RIGHT LEFT  ABI 0.52 1.04     [name], RVT 04/13/2016, 3:40 PM

## 2016-04-13 NOTE — Progress Notes (Signed)
Spoke with Neurosurgery regarding pt, believes MRI to be overall noncontributory to patient's current symptoms.  Recommended medical management with possible outpatient with epidural steroid injections if clinical symptoms worsen.

## 2016-04-13 NOTE — Care Management Obs Status (Signed)
[facility] NOTIFICATION   Patient Details  Name: [name] MRN: [id number] Date of Birth: October 06, 1950   Medicare Observation Status Notification Given:  Yes Cm explained [facility] letter to patient at the bedside. Patient denied having any questions and verbalized understanding using teachback method. Copy given to patient as well.    Guido Sander, RN 04/13/2016, 12:17 PM

## 2016-04-13 NOTE — Care Management Note (Addendum)
Case Management Note  Patient Details  Name: [name] MRN: [id number] Date of Birth: 1951/01/26  Subjective/Objective:                  Right inner thigh pain and paresthesia  Action/Plan: CM spoke to patient over the phone who agreed to RW to be delivered to the bedside. Choice for DME is [facility]. Cm called Reggie with [facility] and requested RW to bedside. She said that she has family that will be able to assist at home. Patient states that she has no other HH needs at this time.   Expected Discharge Date:  04/13/16               Expected Discharge Plan:  Home/Self Care  In-House Referral:     Discharge planning Services  CM Consult  Post Acute Care Choice:  Durable Medical Equipment Choice offered to:     DME Arranged:  [facility] rolling DME Agency:  [place]:    [facility] Agency:     Status of Service:  Completed, signed off  If discussed at [place] of Stay Meetings, dates discussed:    Additional Comments:  Guido Sander, RN 04/13/2016, 11:46 AM

## 2016-04-13 NOTE — Consult Note (Signed)
Vascular and Vein Specialist of [facility]  Patient name: [name] MRN: [id number] DOB: Sep 02, 1950 Sex: female  REASON FOR CONSULT: right leg pain, consult is from [place].   HPI: [name] is a 65 y.o. female, who presents with two week history of right thigh pain. This also includes her right hip and buttock region and has progressively worsened over the past week. She also says that her right toes hurt and feels like she stubbed them. She states that her pain interferes with walking. She denies a history of DVT.   On ROS, the patient states that she has lost 45 pounds in the past year and has been having night sweats. There is a family history of malignancy. She has had a colonoscopy and mammogram in recent years but is due for these again. She has a history of H. Pylori and colonic polyps. She denies melena, hematochezia and hematuria. She has DOE at baseline. Full ROS below.   The patient smokes 3-4 cigarettes per day for the past 40 years. She has a past medical history of COPD. No personal history of CAD or CVA. She is not on an aspirin.  She has not been tested for hyperlipidemia.   Past Medical History:  Diagnosis Date  . Asthma   . COPD (chronic obstructive pulmonary disease) ([place])   . Hx of colonic polyps   . Nausea & vomiting   . PSORIASIS 12/29/2006   Qualifier: Diagnosis of  By: Jobe Igo MD, Shanon Brow    . Smoker     Family History  Problem Relation Age of Onset  . Cancer Other   . Coronary artery disease Other   . Cancer Mother   . Hypertension Mother   . Heart disease Mother   . Cancer Father   . Hypertension Father   . Cancer Sister   . Hypertension Sister     SOCIAL HISTORY: Social History   Social History  . Marital status: Single    Spouse name: N/A  . Number of children: N/A  . Years of education: N/A   Occupational History  . Not on file.   Social History Main Topics  . Smoking status: Current Some Day Smoker   Packs/day: 0.25    Types: Cigarettes  . Smokeless tobacco: Never Used  . Alcohol use No     Comment: Quit alcohol in 1992  . Drug use:     Types: Marijuana  . Sexual activity: No   Other Topics Concern  . Not on file   Social History Narrative  . No narrative on file    No Known Allergies  Current Facility-Administered Medications  Medication Dose Route Frequency Provider Last Rate Last Dose  . albuterol (PROVENTIL) (2.5 MG/3ML) 0.083% nebulizer solution 2.5 mg  2.5 mg Inhalation Q4H PRN Smiley Houseman, MD      . enoxaparin (LOVENOX) injection 40 mg  40 mg Subcutaneous Q24H Smiley Houseman, MD   40 mg at 04/12/16 2226  . HYDROcodone-acetaminophen (NORCO/VICODIN) 5-325 MG per tablet 1 tablet  1 tablet Oral Q6H PRN Smiley Houseman, MD   1 tablet at 04/13/16 0841  . promethazine (PHENERGAN) tablet 25 mg  25 mg Oral Q6H PRN Smiley Houseman, MD        REVIEW OF SYSTEMS:  [X]  denotes positive finding, [ ]  denotes negative finding Cardiac  Comments:  Chest pain or chest pressure:    Shortness of breath upon exertion: x   Short of breath  when lying flat: x   Irregular heart rhythm:        Vascular    Pain in calf, thigh, or hip brought on by ambulation:    Pain in feet at night that wakes you up from your sleep:     Blood clot in your veins:    Leg swelling:  x       Pulmonary    Oxygen at home:    Productive cough:     Wheezing:         Neurologic    Sudden weakness in arms or legs:     Sudden numbness in arms or legs:     Sudden onset of difficulty speaking or slurred speech:    Temporary loss of vision in one eye:     Problems with dizziness:         Gastrointestinal    Blood in stool:     Vomited blood:         Genitourinary    Burning when urinating:     Blood in urine:        Psychiatric    Major depression:         Hematologic    Bleeding problems:    Problems with blood clotting too easily:        Skin    Rashes or ulcers: x Hx of  eczema distal thighs bilaterally      Constitutional    Fever or chills:      PHYSICAL EXAM: Vitals:   04/12/16 2050 04/13/16 0207 04/13/16 0515 04/13/16 1012  BP: (!) 147/89 (!) 158/88 (!) 147/76 (!) 148/80  Pulse: 69 70 68 74  Resp: 18 18 18 18   Temp: 97.6 F (36.4 C) 97.5 F (36.4 C) 98.1 F (36.7 C) 98.3 F (36.8 C)  TempSrc: Oral Oral Oral Oral  SpO2: 100% 100% 99% 100%  Weight:      Height:        GENERAL: The patient is a well-nourished female, in no acute distress. The vital signs are documented above. VASCULAR: Feet are warm bilaterally. 2+ femoral pulses bilaterally. Palpable left popliteal pulse. Non palpable right popliteal pulse. Monophasic right peroneal and posterior tibial doppler signals. 2+ left posterior tibial pulse. Biphasic left DP doppler signal.  PULMONARY: Non labored respiratory effort.  MUSCULOSKELETAL: No swelling of legs bilaterally. No abnormalities noted.  NEUROLOGIC: Motor and sensory function intact feet bilaterally.  SKIN: Scar medial distal thighs from previous eczema. No wounds or ulcers seen.  PSYCHIATRIC: The patient has a normal affect.  DATA:  RLE Venous Duplex 04/12/16  Report: There is no DVT or SVT noted in the right lower extremity.  However, incidentally noted, there appears to be no arterial flow in the distal common femoral artery through to the mid to distal femoral artery with reconstitution noted in the distal femoral artery. There appears to be flow in the popliteal, posterior tibial, peroneal, and anterior tibial arteries, though it exhibits low velocities and dampened waveforms.   MRI Lumbar Spine 04/11/16  IMPRESSION: 1. Small central disc protrusion at L5-S1, closely approximating the bilateral S1 nerve roots without neural impingement or displacement. 2. Degenerative disc bulge and facet disease at L4-5 with resultant mild canal and bilateral foraminal stenosis without neural impingement. 3. Additional fairly mild  degenerative spondylolysis at L2-3 and L3-4 without significant stenosis or evidence of neural impingement.   MEDICAL ISSUES: Right hip and thigh pain   Patient presenting with two week history of  right thigh and hip pain. On venous duplex, there was an incidental finding of distal common femoral artery occlusion. She has a palpable right femoral pulse.  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 Her right thigh pain is unlikely related to this as her peripheral arterial disease is likely chronic.  Her atherosclerotic risk factors include 40 year smoking history. Will obtain ABIs. Dr. Donzetta Matters to review RLE duplex and evaluate waveforms. Further recommendations pending ABIs.   Night sweats and weight loss  Concern for malignancy. Work-up per primary.    Virgina Jock, PA-C Vascular and Vein Specialists of [place] [phone]  Addendum As above, suspect she has an sfa occlusion with distal reconstitution. This may or not be contributing to symptoms but she does have a 40pack year history of smoking. She should be on aspirin and statin if not otherwise contraindicated. Will get ABI and possibly will need aortogram with ble runoff this week vs. As outpatient.   Lynnox Girten C. Donzetta Matters, MD Vascular and Vein Specialists of [place] Office: [phone] Pager: [phone]

## 2016-04-13 NOTE — Progress Notes (Signed)
Family Medicine Teaching Service Daily Progress Note Intern Pager: [phone]  Patient name: [name] Medical record number: [id number] Date of birth: 1951-04-28 Age: 65 y.o. Gender: female  Primary Care Provider: Smiley Houseman, MD Consultants: none Code Status: FULL  Pt Overview and Major Events to Date:  8/11: direct admit from clinic for right thigh pain   Assessment and Plan: [name] is a 65 y.o. female presenting with right inner thigh pain and paresthesia . PMH is significant for COPD, GERD with history of H. Pylori, Cyclic vomiting, Psoriasis  Right Antero-Medial Thigh Pain and Hip Pain: 1 week history of symptoms. No trauma. 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  Concern for malignancy with associated night sweats and weight loss (170lb in Jan and 158lb in clinic).CBC and CMP wnl. TSH wnl, Vit B12 wnl. MRI Lumbar spine with small central disc protrusion at L5-S1 without impingement; fascet disease in L4-5, and spondylolysis at L2-4 without impingement. Venous US without evidence of DVT in right LE. However, no arterial flow in the distal common femoral artery through to the mid to distal femoral artery with reconstitution in the distal femoral artery.  - Norco q 6hr PRN for pain  - neurosurgery consult  -vascular consult for findings on venous US as question whether this lack of arterial flow could be contributing to pain   COPD: stable and at baseline per patient. Patient is not on a controller medication currently. Per chart review, had been on Spiriva.  - Albuterol PRN   Hx of Cyclic Vomiting: no nausea or vomiting - home phenergan PRN   FEN/GI: heart healthy, SLIV Prophylaxis: Lovenox  Disposition:   Subjective:  Reports pain continues. Denies numbness/tingling in the thigh. Otherwise, feels well.   Objective: Temp:  [97.5 F (36.4 C)-98.5 F (36.9 C)] 98.1 F (36.7 C) (08/13  0515) Pulse Rate:  [68-80] 68 (08/13 0515) Resp:  [18] 18 (08/13 0515) BP: (138-158)/(76-91) 147/76 (08/13 0515) SpO2:  [99 %-100 %] 99 % (08/13 0515) Weight:  [158 lb 6.4 oz (71.9 kg)] 158 lb 6.4 oz (71.9 kg) (08/12 1500) Physical Exam: GEN: NAD; prior to exam, patient seemed to ambulate with PT without difficulty CV: RRR, no murmurs, rubs, or gallops PULM: CTAB, normal effort ABD: Soft, nontender, nondistended, NABS, no organomegaly SKIN: No rash or cyanosis; warm and well-perfused EXTR: Right LE: anterior-medial thigh tenderness to palpation, no swelling or increased warmth; TTP over right ischial spine; no decreased sensation over right LE  PSYCH: Mood and affect euthymic, normal rate and volume of speech NEURO: Awake, alert, no focal deficits grossly, normal speech  Laboratory:  Recent Labs Lab 04/11/16 1908  WBC 6.4  HGB 12.4  HCT 38.5  PLT 329    Recent Labs Lab 04/11/16 1908  NA 138  K 4.0  CL 103  CO2 25  BUN 10  CREATININE 0.92  CALCIUM 9.6  PROT 7.5  BILITOT 0.1*  ALKPHOS 106  ALT 8*  AST 15  GLUCOSE 99   Imaging/Diagnostic Tests: MRI Lumbar Spine:  IMPRESSION: 1. Small central disc protrusion at L5-S1, closely approximating the bilateral S1 nerve roots without neural impingement or displacement. 2. Degenerative disc bulge and facet disease at L4-5 with resultant mild canal and bilateral foraminal stenosis without neural impingement. 3. Additional fairly mild degenerative spondylolysis at L2-3 and L3-4 without significant stenosis or evidence of neural impingement.  Nicolette Bang, DO 04/13/2016, 8:05 AM PGY-2, [place]  [place] Intern pager: [phone], text pages welcome

## 2016-04-14 DIAGNOSIS — I743 Embolism and thrombosis of arteries of the lower extremities: Secondary | ICD-10-CM | POA: Diagnosis not present

## 2016-04-14 DIAGNOSIS — R200 Anesthesia of skin: Secondary | ICD-10-CM | POA: Diagnosis not present

## 2016-04-14 DIAGNOSIS — R634 Abnormal weight loss: Secondary | ICD-10-CM | POA: Diagnosis not present

## 2016-04-14 DIAGNOSIS — K219 Gastro-esophageal reflux disease without esophagitis: Secondary | ICD-10-CM | POA: Diagnosis not present

## 2016-04-14 DIAGNOSIS — R202 Paresthesia of skin: Secondary | ICD-10-CM | POA: Diagnosis not present

## 2016-04-14 DIAGNOSIS — M79601 Pain in right arm: Secondary | ICD-10-CM | POA: Diagnosis not present

## 2016-04-14 DIAGNOSIS — M5127 Other intervertebral disc displacement, lumbosacral region: Secondary | ICD-10-CM | POA: Diagnosis not present

## 2016-04-14 DIAGNOSIS — L409 Psoriasis, unspecified: Secondary | ICD-10-CM | POA: Diagnosis not present

## 2016-04-14 DIAGNOSIS — Z8619 Personal history of other infectious and parasitic diseases: Secondary | ICD-10-CM | POA: Diagnosis not present

## 2016-04-14 DIAGNOSIS — M5126 Other intervertebral disc displacement, lumbar region: Secondary | ICD-10-CM | POA: Diagnosis not present

## 2016-04-14 DIAGNOSIS — J449 Chronic obstructive pulmonary disease, unspecified: Secondary | ICD-10-CM | POA: Diagnosis not present

## 2016-04-14 DIAGNOSIS — I70211 Atherosclerosis of native arteries of extremities with intermittent claudication, right leg: Secondary | ICD-10-CM | POA: Diagnosis not present

## 2016-04-14 DIAGNOSIS — F17210 Nicotine dependence, cigarettes, uncomplicated: Secondary | ICD-10-CM | POA: Diagnosis not present

## 2016-04-14 DIAGNOSIS — G43A0 Cyclical vomiting, not intractable: Secondary | ICD-10-CM | POA: Diagnosis not present

## 2016-04-14 MED ORDER — ASPIRIN EC 81 MG PO TBEC
81.0000 mg | DELAYED_RELEASE_TABLET | Freq: Every day | ORAL | Status: DC
  Administered 2016-04-14 – 2016-04-15 (×2): 81 mg via ORAL
  Filled 2016-04-14 (×2): qty 1

## 2016-04-14 MED ORDER — ATORVASTATIN CALCIUM 80 MG PO TABS
80.0000 mg | ORAL_TABLET | Freq: Every day | ORAL | Status: DC
  Administered 2016-04-14: 80 mg via ORAL
  Filled 2016-04-14: qty 1

## 2016-04-14 NOTE — Progress Notes (Signed)
  Progress Note    04/14/2016 5:18 PM * No surgery found *  Subjective:  Still having pain in R medial and posterior thigh  Vitals:   04/14/16 1040 04/14/16 1449  BP: (!) 143/83 124/64  Pulse: 69 70  Resp: 18 18  Temp: 98.3 F (36.8 C) 98 F (36.7 C)    Physical Exam: Aaox3 rrr Non labored breathing R foot is warm, no palpable pulses  CBC    Component Value Date/Time   WBC 6.4 04/11/2016 1908   RBC 4.23 04/11/2016 1908   HGB 12.4 04/11/2016 1908   HCT 38.5 04/11/2016 1908   PLT 329 04/11/2016 1908   MCV 91.0 04/11/2016 1908   MCH 29.3 04/11/2016 1908   MCHC 32.2 04/11/2016 1908   RDW 13.5 04/11/2016 1908   LYMPHSABS 2.3 05/08/2013 2240   MONOABS 0.3 05/08/2013 2240   EOSABS 0.0 05/08/2013 2240   BASOSABS 0.0 05/08/2013 2240    BMET    Component Value Date/Time   NA 138 04/11/2016 1908   K 4.0 04/11/2016 1908   CL 103 04/11/2016 1908   CO2 25 04/11/2016 1908   GLUCOSE 99 04/11/2016 1908   BUN 10 04/11/2016 1908   CREATININE 0.92 04/11/2016 1908   CREATININE 0.79 08/15/2013 1446   CALCIUM 9.6 04/11/2016 1908   GFRNONAA >60 04/11/2016 1908   GFRAA >60 04/11/2016 1908    INR No results found for: INR  No intake or output data in the 24 hours ending 04/14/16 1718   Assessment:  65 y.o. female with R medial thigh pain. She has an ABI on that side compared to 1 on the left and duplex to r/o dvt demostrated occlusion of proximal sfa and likely common femoral disease. From a vascular standpoint she needs to quit smoking and is now on aspirin and statin. She has claudication at a couple of blocks but no rest pain in her foot. Doubtful that her thigh pain is a result of her vascular disease as clinical picture does not fit. I do not think this pain will benefit from intervention but will have her f/u in office for further discussion of vascular disease and possible intervention in the future.   Rasheed Welty C. Donzetta Matters, MD Vascular and Vein Specialists of  [place] Office: [phone] Pager: [phone]  04/14/2016 5:18 PM

## 2016-04-14 NOTE — Progress Notes (Signed)
Family Medicine Teaching Service Daily Progress Note Intern Pager: [phone]  Patient name: [name] Medical record number: [id number] Date of birth: 01-06-1951 Age: 65 y.o. Gender: female  Primary Care Provider: Smiley Houseman, MD Consultants: none Code Status: FULL  Pt Overview and Major Events to Date:  8/11: direct admit from clinic for right thigh pain   Assessment and Plan: [name] is a 65 y.o. female presenting with right inner thigh pain and paresthesia x 1 week . PMH is significant for COPD, GERD with history of H. Pylori, Cyclic vomiting, and Psoriasis.  Right Antero-Medial Thigh Pain and Hip Pain: 1 week history of symptoms. Denies trauma. 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  Concern for malignancy with associated night sweats and weight loss (170lb in Jan and 158lb in clinic).CBC and CMP wnl. TSH wnl, Vit B12 wnl. MRI Lumbar spine with small central disc protrusion at L5-S1 without impingement; fascet disease in L4-5, and spondylolysis at L2-4 without impingement. Venous US without evidence of DVT in right LE. However, no arterial flow in the distal common femoral artery through to the mid to distal femoral artery with reconstitution in the distal femoral artery.  -Vascular consult for findings on venous US as question whether this lack of arterial flow could be contributing to pain. Right ABI: borderline severe reduction in arterial blood flow to the LE with abnormal waveforms, at rest.  Left ABI: normal art blood flow and wave form at rest. Recc she should be on a statin and aspirin if no contraindications. -started Atorvastatin 80mg   -lipid panel ordered, f/u - neurosurgery consult - recc medical management, findings on MRI are likely noncontributory to her current symptoms - Norco q 6hr PRN for pain   COPD: stable and at baseline per patient. Patient is not on a controller medication currently. Per  chart review, had been on Spiriva.  - Albuterol PRN   Hx of Cyclic Vomiting: no nausea or vomiting - home phenergan PRN   FEN/GI: heart healthy, SLIV Prophylaxis: Lovenox  Disposition: pending clinical improvement.  Subjective:  Reports pain continues. Denies numbness/tingling in the thigh. Otherwise, feels well.   Objective: Temp:  [97.9 F (36.6 C)-98.8 F (37.1 C)] 98.3 F (36.8 C) (08/14 1040) Pulse Rate:  [54-88] 69 (08/14 1040) Resp:  [18] 18 (08/14 1040) BP: (132-163)/(57-95) 143/83 (08/14 1040) SpO2:  [99 %-100 %] 99 % (08/14 1040) Physical Exam: GEN: NAD; prior to exam, patient seemed to ambulate with PT without difficulty CV: RRR, no murmurs, rubs, or gallops PULM: CTAB, normal effort ABD: Soft, nontender, nondistended, NABS, no organomegaly SKIN: No rash or cyanosis; warm and well-perfused EXTR: Right LE: anterior-medial thigh tenderness to palpation, no swelling or increased warmth; TTP over right ischial spine; no decreased sensation over right LE  PSYCH: Mood and affect euthymic, normal rate and volume of speech NEURO: Awake, alert, no focal deficits grossly, normal speech  Laboratory:  Recent Labs Lab 04/11/16 1908  WBC 6.4  HGB 12.4  HCT 38.5  PLT 329    Recent Labs Lab 04/11/16 1908  NA 138  K 4.0  CL 103  CO2 25  BUN 10  CREATININE 0.92  CALCIUM 9.6  PROT 7.5  BILITOT 0.1*  ALKPHOS 106  ALT 8*  AST 15  GLUCOSE 99   Imaging/Diagnostic Tests: MRI Lumbar Spine:  IMPRESSION: 1. Small central disc protrusion at L5-S1, closely approximating the bilateral S1 nerve roots without neural impingement  or displacement. 2. Degenerative disc bulge and facet disease at L4-5 with resultant mild canal and bilateral foraminal stenosis without neural impingement. 3. Additional fairly mild degenerative spondylolysis at L2-3 and L3-4 without significant stenosis or evidence of neural impingement.  Lovenia Kim, MD 04/14/2016, 12:34 PM PGY-1, [place] Intern pager: [phone], text pages welcome

## 2016-04-15 ENCOUNTER — Ambulatory Visit: Payer: Medicare Other | Admitting: Internal Medicine

## 2016-04-15 ENCOUNTER — Other Ambulatory Visit: Payer: Self-pay | Admitting: *Deleted

## 2016-04-15 DIAGNOSIS — Z8619 Personal history of other infectious and parasitic diseases: Secondary | ICD-10-CM | POA: Diagnosis not present

## 2016-04-15 DIAGNOSIS — G43A0 Cyclical vomiting, not intractable: Secondary | ICD-10-CM | POA: Diagnosis not present

## 2016-04-15 DIAGNOSIS — M5127 Other intervertebral disc displacement, lumbosacral region: Secondary | ICD-10-CM | POA: Diagnosis not present

## 2016-04-15 DIAGNOSIS — M79601 Pain in right arm: Secondary | ICD-10-CM | POA: Diagnosis not present

## 2016-04-15 DIAGNOSIS — I70211 Atherosclerosis of native arteries of extremities with intermittent claudication, right leg: Secondary | ICD-10-CM | POA: Diagnosis not present

## 2016-04-15 DIAGNOSIS — M79604 Pain in right leg: Secondary | ICD-10-CM | POA: Diagnosis not present

## 2016-04-15 DIAGNOSIS — R202 Paresthesia of skin: Secondary | ICD-10-CM | POA: Diagnosis not present

## 2016-04-15 DIAGNOSIS — I743 Embolism and thrombosis of arteries of the lower extremities: Secondary | ICD-10-CM | POA: Diagnosis not present

## 2016-04-15 DIAGNOSIS — R200 Anesthesia of skin: Secondary | ICD-10-CM | POA: Diagnosis not present

## 2016-04-15 DIAGNOSIS — K219 Gastro-esophageal reflux disease without esophagitis: Secondary | ICD-10-CM | POA: Diagnosis not present

## 2016-04-15 DIAGNOSIS — I739 Peripheral vascular disease, unspecified: Secondary | ICD-10-CM

## 2016-04-15 DIAGNOSIS — L409 Psoriasis, unspecified: Secondary | ICD-10-CM | POA: Diagnosis not present

## 2016-04-15 DIAGNOSIS — M5126 Other intervertebral disc displacement, lumbar region: Secondary | ICD-10-CM | POA: Diagnosis not present

## 2016-04-15 DIAGNOSIS — J449 Chronic obstructive pulmonary disease, unspecified: Secondary | ICD-10-CM | POA: Diagnosis not present

## 2016-04-15 DIAGNOSIS — F17210 Nicotine dependence, cigarettes, uncomplicated: Secondary | ICD-10-CM | POA: Diagnosis not present

## 2016-04-15 LAB — LIPID PANEL
CHOL/HDL RATIO: 6.2 ratio
Cholesterol: 212 mg/dL — ABNORMAL HIGH (ref 0–200)
HDL: 34 mg/dL — AB (ref >40)
LDL Cholesterol: 140 mg/dL — ABNORMAL HIGH (ref 0–99)
Triglycerides: 189 mg/dL — ABNORMAL HIGH (ref <150)
VLDL: 38 mg/dL (ref 0–40)

## 2016-04-15 LAB — BASIC METABOLIC PANEL
ANION GAP: 10 (ref 5–15)
BUN: 10 mg/dL (ref 6–20)
CALCIUM: 9.7 mg/dL (ref 8.9–10.3)
CO2: 24 mmol/L (ref 22–32)
Chloride: 104 mmol/L (ref 101–111)
Creatinine, Ser: 0.86 mg/dL (ref 0.44–1.00)
GFR calc Af Amer: >60 mL/min (ref >60)
Glucose, Bld: 119 mg/dL — ABNORMAL HIGH (ref 65–99)
POTASSIUM: 4.2 mmol/L (ref 3.5–5.1)
SODIUM: 138 mmol/L (ref 135–145)

## 2016-04-15 LAB — CBC
HCT: 40.7 % (ref 36.0–46.0)
Hemoglobin: 12.9 g/dL (ref 12.0–15.0)
MCH: 29.1 pg (ref 26.0–34.0)
MCHC: 31.7 g/dL (ref 30.0–36.0)
MCV: 91.7 fL (ref 78.0–100.0)
PLATELETS: 323 10*3/uL (ref 150–400)
RBC: 4.44 MIL/uL (ref 3.87–5.11)
RDW: 13.4 % (ref 11.5–15.5)
WBC: 5.4 10*3/uL (ref 4.0–10.5)

## 2016-04-15 MED ORDER — ATORVASTATIN CALCIUM 80 MG PO TABS: 80.0000 mg | ORAL_TABLET | Freq: Every day | ORAL | 1 refills | Status: DC

## 2016-04-15 MED ORDER — ACETAMINOPHEN 325 MG PO TABS: 650.0000 mg | ORAL_TABLET | Freq: Four times a day (QID) | ORAL | 1 refills | Status: DC | PRN

## 2016-04-15 MED ORDER — ACETAMINOPHEN 325 MG PO TABS
650.0000 mg | ORAL_TABLET | Freq: Four times a day (QID) | ORAL | Status: DC | PRN
  Administered 2016-04-15: 650 mg via ORAL
  Filled 2016-04-15: qty 2

## 2016-04-15 MED ORDER — IBUPROFEN 400 MG PO TABS: 400.0000 mg | ORAL_TABLET | Freq: Four times a day (QID) | ORAL | 0 refills | Status: DC | PRN

## 2016-04-15 MED ORDER — IBUPROFEN 200 MG PO TABS: 400.0000 mg | ORAL_TABLET | Freq: Four times a day (QID) | ORAL | Status: DC | PRN

## 2016-04-15 MED ORDER — ASPIRIN 81 MG PO TBEC: 81.0000 mg | DELAYED_RELEASE_TABLET | Freq: Every day | ORAL | 0 refills | Status: DC

## 2016-04-15 NOTE — Care Management Note (Signed)
Case Management Note  Patient Details  Name: [name] MRN: [id number] Date of Birth: [birth year]  Subjective/Objective:                    Action/Plan: Pt discharging home with self care. PT recommending outpatient therapy and orders placed in EPIC per MD. Pt also with orders for rolling walker. Walker at the bedside. No further needs per CM.   Expected Discharge Date:  04/13/16               Expected Discharge Plan:  Home/Self Care  In-House Referral:     Discharge planning Services  CM Consult  Post Acute Care Choice:  Durable Medical Equipment Choice offered to:     DME Arranged:  Walker rolling DME Agency:  [place]:    [facility] Agency:     Status of Service:  Completed, signed off  If discussed at [place] of Stay Meetings, dates discussed:    Additional Comments:  Pollie Friar, RN 04/15/2016, 2:13 PM

## 2016-04-15 NOTE — Progress Notes (Signed)
  Progress Note    04/15/2016 11:00 AM * No surgery found *  Subjective: still having right medial thigh pain   Vitals:   04/15/16 0530 04/15/16 1001  BP: 119/68 125/74  Pulse: 72 69  Resp: 20 16  Temp: 98.2 F (36.8 C) 97.8 F (36.6 C)    Physical Exam: R foot well perfused No wounds No abnormalities of right thigh Palpable bilateral femoral pulses  CBC    Component Value Date/Time   WBC 5.4 04/15/2016 0827   RBC 4.44 04/15/2016 0827   HGB 12.9 04/15/2016 0827   HCT 40.7 04/15/2016 0827   PLT 323 04/15/2016 0827   MCV 91.7 04/15/2016 0827   MCH 29.1 04/15/2016 0827   MCHC 31.7 04/15/2016 0827   RDW 13.4 04/15/2016 0827   LYMPHSABS 2.3 05/08/2013 2240   MONOABS 0.3 05/08/2013 2240   EOSABS 0.0 05/08/2013 2240   BASOSABS 0.0 05/08/2013 2240    BMET    Component Value Date/Time   NA 138 04/15/2016 0827   K 4.2 04/15/2016 0827   CL 104 04/15/2016 0827   CO2 24 04/15/2016 0827   GLUCOSE 119 (H) 04/15/2016 0827   BUN 10 04/15/2016 0827   CREATININE 0.86 04/15/2016 0827   CREATININE 0.79 08/15/2013 1446   CALCIUM 9.7 04/15/2016 0827   GFRNONAA >60 04/15/2016 0827   GFRAA >60 04/15/2016 0827    INR No results found for: INR   Intake/Output Summary (Last 24 hours) at 04/15/16 1100 Last data filed at 04/14/16 2200  Gross per 24 hour  Intake              480 ml  Output                0 ml  Net              480 ml     Assessment:  65 y.o. female with R medial thigh pain, abi 0.5 on right, significant smoking history:   Plan Ok for d/c home from vascular standpoint Will f/u in office in 3 months with repeat abi Needs to be on asa/statin therapy  Smoking cessation encouraged  Judith Campillo C. Donzetta Matters, MD [facility]in Specialists of [place] Office: [phone] Pager: [phone]  04/15/2016 11:00 AM

## 2016-04-15 NOTE — Discharge Summary (Signed)
Family Medicine Teaching [facility] Discharge Summary  Patient name: [name] Medical record number: [id number] Date of birth: [birth year] Age: 64 y.o. Gender: female Date of Admission: 04/11/2016  Date of Discharge: 04/15/2016 Admitting Physician: Doreene Eland, MD  Primary Care Provider: Palma Holter, MD Consultants: Vascular surgery  Indication for Hospitalization: Right thigh pain and paresthesia  Discharge Diagnoses/Problem List:  Thigh pain and paresthesia COPD GERD Psoriasis Cyclical vomiting  Disposition: Home  Discharge Condition: Stable, improved.  Discharge Exam:  GEN: NAD; resting comfortably  CV: RRR, no murmurs, rubs, or gallops PULM: CTAB, normal effort ABD: Soft, nontender, nondistended, no organomegaly SKIN: No rash or cyanosis; warm and well-perfused EXTR: Right LE: anterior-medial thigh slight tenderness to palpation, no swelling or increased warmth; no decreased sensation over right LE  PSYCH: Mood and affect euthymic, normal rate and volume of speech NEURO: Awake, alert, no focal deficits grossly, normal speech  Brief Hospital Course:  Kathleen Stimpsonis a 65 y.o.femalepresenting with right inner thigh pain and paresthesia x 1 week. PMH significant for COPD, GERD with history of H. Pylori, Cyclic vomiting, and Psoriasis.  Right Antero-Medial Thigh Pain and Hip Pain Pt complaining of 1 week history of symptoms. She denied any history of trauma. 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 Vascular surgery was consulted for findings on venous US showing lack of arterial flow to RLE. ABIs were measured showing borderline severe reduction in arterial blood flow to the LE with abnormal waveforms, at rest. Left ABI showing normal arterial blood flow and wave form at rest. Vascular surgery also recommended smoking cessation, and starting aspirin and a statin medication, to be  continued upon discharge. Vascular will follow her on an outpatient basis.  Patient's COPD and cyclic vomiting stable during hospital admission and pt was continued on home meds.  Issues for Follow Up:  1. Will need close outpatient follow up with vascular surgery. 2. Follow up with PCP in 1 week 3. Vascular recommendations: stop smoking, aspirin and statin medication. Consider adding Plavix once outpatient.  Significant Procedures: None  Significant Labs and Imaging:   Recent Labs Lab 04/11/16 1908 04/15/16 0827  WBC 6.4 5.4  HGB 12.4 12.9  HCT 38.5 40.7  PLT 329 323    Recent Labs Lab 04/11/16 1908 04/15/16 0827  NA 138 138  K 4.0 4.2  CL 103 104  CO2 25 24  GLUCOSE 99 119*  BUN 10 10  CREATININE 0.92 0.86  CALCIUM 9.6 9.7  ALKPHOS 106  --   AST 15  --   ALT 8*  --   ALBUMIN 3.9  --    Results/Tests Pending at Time of Discharge: None  Discharge Medications:    Medication List    TAKE these medications   acetaminophen 325 MG tablet Commonly known as:  TYLENOL Take 2 tablets (650 mg total) by mouth every 6 (six) hours as needed for moderate pain. What changed:  You were already taking a medication with the same name, and this prescription was added. Make sure you understand how and when to take each.   acetaminophen 500 MG tablet Commonly known as:  TYLENOL Take 1,000 mg by mouth every 6 (six) hours as needed for pain. What changed:  Another medication with the same name was added. Make sure you understand how and when to take each.   aspirin 81 MG EC tablet Take 1 tablet (81 mg total) by  mouth daily.   atorvastatin 80 MG tablet Commonly known as:  LIPITOR Take 1 tablet (80 mg total) by mouth daily at 6 PM.   HYDROcodone-acetaminophen 5-325 MG tablet Commonly known as:  NORCO Take 1 tablet by mouth every 6 (six) hours as needed for moderate pain.   ibuprofen 400 MG tablet Commonly known as:  ADVIL,MOTRIN Take 1 tablet (400 mg total) by mouth every  6 (six) hours as needed for moderate pain.   PROAIR HFA 108 (90 Base) MCG/ACT inhaler Generic drug:  albuterol inhale 2 puffs by mouth every 4 hours if needed for wheezing or shortness of breath   promethazine 25 MG tablet Commonly known as:  PHENERGAN Take 1 tablet (25 mg total) by mouth every 6 (six) hours as needed for nausea or vomiting.      Discharge Instructions: Please refer to Patient Instructions section of EMR for full details. Patient was counseled important signs and symptoms that should prompt return to medical care, changes in medications, dietary instructions, activity restrictions, and follow up appointments.   Follow-Up Appointments: Follow-up Information    Servando Snare, MD Follow up in 3 month(s).   Specialties:  Vascular Surgery, Cardiology Why:  Office will call you to arrange your appt (sent) Contact information: [place] [address] [phone]          Lovenia Kim, MD 04/17/2016, 3:41 PM PGY-1, [place]

## 2016-04-15 NOTE — Progress Notes (Signed)
Pt d/c to home by car with family. Assessment stable. All questions answered. 

## 2016-04-15 NOTE — Progress Notes (Signed)
Family Medicine Teaching Service Daily Progress Note Intern Pager: [phone]  Patient name: [name] Medical record number: [id number] Date of birth: 01/11/1951 Age: 65 y.o. Gender: female  Primary Care Provider: Smiley Houseman, MD Consultants: none Code Status: FULL  Pt Overview and Major Events to Date:  8/11: direct admit from clinic for right thigh pain   Assessment and Plan: Aliscia Mcalhany is a 65 y.o. female presenting with right inner thigh pain and paresthesia x 1 week . PMH is significant for COPD, GERD with history of H. Pylori, Cyclic vomiting, and Psoriasis.  Right Antero-Medial Thigh Pain and Hip Pain: 1 week history of symptoms. Denies trauma. 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  Right ABI: borderline severe reduction in arterial blood flow to the LE with abnormal waveforms, at rest.  Left ABI: normal art blood flow and wave form at rest. Neurosurgery reccc medical management, findings on MRI are likely noncontributory to her current symptoms. -Vascular consult for findings on venous US as question whether this lack of arterial flow could be contributing to pain. Appreciate reccs - duplex to r/o dvt demonstrated occlusion of proximal SFA. Quit smoking and is now on aspirin and statin. Unlikely that thigh pain is from vasc dx, will follow up with her outpatient.  - Atorvastatin 80mg   -lipid panel ordered, f/u - discontinued Norco q 6hr prn 8/15 -Tylenol 650 mg Q6 prn  -Ibuprofen 400 mg Q6 prn -Plan for discharge: Tylenol, Iburprofen for pain control, follow up in 1 week and close follow up with vascular as outpatient.  COPD: stable and at baseline per patient. Patient is not on a controller medication currently. Per chart review, had been on Spiriva.  - Albuterol PRN   Hx of Cyclic Vomiting: no nausea or vomiting - home phenergan PRN   FEN/GI: heart healthy, SLIV Prophylaxis:  Lovenox  Disposition: pending clinical improvement  Subjective:  Reports pain continues. Denies numbness/tingling in the thigh. Otherwise, feels well. Feels better when she uses heat packs.  Objective: Temp:  [98 F (36.7 C)-98.3 F (36.8 C)] 98.2 F (36.8 C) (08/15 0530) Pulse Rate:  [68-72] 72 (08/15 0530) Resp:  [18-20] 20 (08/15 0530) BP: (119-143)/(64-83) 119/68 (08/15 0530) SpO2:  [99 %-100 %] 100 % (08/15 0530) Physical Exam: GEN: NAD; resting comfortably  CV: RRR, no murmurs, rubs, or gallops PULM: CTAB, normal effort ABD: Soft, nontender, nondistended, NABS, no organomegaly SKIN: No rash or cyanosis; warm and well-perfused EXTR: Right LE: anterior-medial thigh tenderness to palpation, no swelling or increased warmth; TTP over right ischial spine; no decreased sensation over right LE  PSYCH: Mood and affect euthymic, normal rate and volume of speech NEURO: Awake, alert, no focal deficits grossly, normal speech  Laboratory:  Recent Labs Lab 04/11/16 1908  WBC 6.4  HGB 12.4  HCT 38.5  PLT 329    Recent Labs Lab 04/11/16 1908  NA 138  K 4.0  CL 103  CO2 25  BUN 10  CREATININE 0.92  CALCIUM 9.6  PROT 7.5  BILITOT 0.1*  ALKPHOS 106  ALT 8*  AST 15  GLUCOSE 99   Imaging/Diagnostic Tests: MRI Lumbar Spine:  IMPRESSION: 1. Small central disc protrusion at L5-S1, closely approximating the bilateral S1 nerve roots without neural impingement or displacement. 2. Degenerative disc bulge and facet disease at L4-5 with resultant mild canal and bilateral foraminal stenosis without neural impingement. 3. Additional fairly mild degenerative spondylolysis at L2-3 and L3-4  without significant stenosis or evidence of neural impingement.  Lovenia Kim, MD 04/15/2016, 7:24 AM PGY-1, [place] Intern pager: [phone], text pages welcome

## 2016-04-21 NOTE — Progress Notes (Deleted)
Subjective:     Patient ID: [name], female   DOB: 10/05/1950, 65 y.o.   MRN: [id number]  HPI [name] is a 65yo female presenting today for hospital follow up. - Hospitalized from 8/11 to 8/15. Initially presented for right inner thigh pain and paresthesia x1week. MRI lumbar spine with disc protrusion at L5-S1 without neural impingement, disc bulge and facet disease at L4-L5 with foraminal stenosis, and degenerative spondylolysis at L2-L3 and L3-L4. Venous US with no arterial flow in distal common femoral artery through to the mid to distal femoral artery with reconstitution in the distal femoral artery. ABIs with borderline severe reduction in arterial blood flow and wave form at rest. Vascular surgery consulted. Recommended smoking cessation and initiation of Statin and Aspirin; to follow up with Vascular Surgery as outpatient. Consider Plavix as outpatient.  Review of Systems     Objective:   Physical Exam     Assessment:     ***    Plan:     ***

## 2016-04-22 ENCOUNTER — Inpatient Hospital Stay: Payer: Medicare Other | Admitting: Family Medicine

## 2016-05-21 ENCOUNTER — Other Ambulatory Visit: Payer: Self-pay | Admitting: Internal Medicine

## 2016-05-22 ENCOUNTER — Telehealth: Payer: Self-pay | Admitting: Internal Medicine

## 2016-05-22 NOTE — Telephone Encounter (Signed)
Pt is calling because she needs a referral to be seen at the eye doctor on Tuesday the 26th. She will be going to [facility]. She needs [place] to fax over the silver[place] referral to [phone]. jw

## 2016-05-22 NOTE — Telephone Encounter (Signed)
Done

## 2016-05-25 NOTE — Progress Notes (Signed)
   [facility] Phone: [phone]   Date of Visit: 05/26/2016   HPI:  Patient was recently hospitalized in August for Right thigh and hip pain, and was found to have lack of arterial flow to RLE. X-ray of hip with symmetric osteoarthritic change bilaterally. Vascular surgery was consulted. ABIs were measured showing borderline severe reduction in arterial blood flow to the LE with abnormal waveforms, at rest. Left ABI showing normal. Vascular Surgery Recommendations: statin and ASA, smoking cessation, f/u outpatient (in 3 months), consider adding Plavix once outpatient. Of note, in hospital note, vascular reported that it is doubtful that her thigh pain is a result of her vascular disease as clinical picture does not fit. 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 Degenerative disc bulge and facet disease at L4-5 with resultant mild canal and bilateral foraminal stenosis without neural impingement.Additional fairly mild degenerative spondylolysis at L2-3 and L3-4 without significant stenosis or evidence of neural impingement.  - patient's symptoms have completely resolved; she had symptoms for 1 more week after discharge but then self resolved.  - able to walk, denies numbness/tingling of LE - no issues with urination - 4 cigarrettes a day currently since discharge; used to smoke 7-8 - she takes ASA and Lipitor as directed   Psoriasis:  - reports she gets rashes every month or so  - had been using Clobetasol cream almost every-day. She uses despite her having a flare. - also uses Vaseline  - current flare for the past 2 weeks or so. Mainly on elbows and knees   ROS: See HPI.  [place]:  PMH:  Occlusion of Common Femoral Artery COPD HTN with retinopathy Hiatal Hernia with  Reflux, history of H. Pylori Cyclic Vomiting Psoriasis:  Lumbar Disc Herniation Tobacco Use  PHYSICAL EXAM: BP (!) 141/72   Pulse 89   Temp 98.3 F (36.8 C) (Oral)   Wt 163 lb (73.9 kg)   SpO2 97%   BMI 25.53 kg/m  GEN: NAD CV: RRR, no m/r/g PULM: CTAB, normal effort SKIN: small red circular plaques on medial aspects of knees bilaterally. Plaques on bilateral elbows.  EXTR: No lower extremity edema or calf tenderness PSYCH: Mood and affect euthymic, normal rate and volume of speech NEURO: Awake, alert, no focal deficits grossly, normal speech  ASSESSMENT/PLAN:  Health maintenance:  -patient to make an appointment as soon as possible for annual [facility] Follow Up:  - doing well, symptoms have resolved   Occlusion of common femoral artery ([place]) Patient to see vascular surgery in about 3 months. No symptoms with walking. - continue ASA, Lipitor - patient is cutting down on her smoking   PSORIASIS Currently has a flare. Is out of Clobetasol which usually improves symptoms per patient. Discussed appropriate use of Clobetasol (only during flare) and the importance of medication holiday when symptoms resolve. Patient understands this. Also encouraged to use Aquaphor and Vaseline for moisturization.   Smiley Houseman, MD PGY [place]

## 2016-05-26 ENCOUNTER — Ambulatory Visit (INDEPENDENT_AMBULATORY_CARE_PROVIDER_SITE_OTHER): Payer: Commercial Managed Care - HMO | Admitting: Internal Medicine

## 2016-05-26 ENCOUNTER — Encounter: Payer: Self-pay | Admitting: Internal Medicine

## 2016-05-26 VITALS — BP 141/72 | HR 89 | Temp 98.3°F | Wt 163.0 lb

## 2016-05-26 DIAGNOSIS — I743 Embolism and thrombosis of arteries of the lower extremities: Secondary | ICD-10-CM

## 2016-05-26 DIAGNOSIS — Z09 Encounter for follow-up examination after completed treatment for conditions other than malignant neoplasm: Secondary | ICD-10-CM | POA: Diagnosis not present

## 2016-05-26 DIAGNOSIS — L409 Psoriasis, unspecified: Secondary | ICD-10-CM

## 2016-05-26 DIAGNOSIS — I70209 Unspecified atherosclerosis of native arteries of extremities, unspecified extremity: Secondary | ICD-10-CM

## 2016-05-26 DIAGNOSIS — L408 Other psoriasis: Secondary | ICD-10-CM

## 2016-05-26 MED ORDER — CLOBETASOL PROPIONATE 0.05 % EX CREA: 1.0000 "application " | TOPICAL_CREAM | Freq: Two times a day (BID) | CUTANEOUS | 0 refills | Status: DC | PRN

## 2016-05-26 NOTE — Patient Instructions (Addendum)
Please make a follow up visit with me for annual wellness/physical.   Use the Clobetasol when you have these flares, but when your symptoms go away, I want you to stop applying the cream.  You can try Aquaphor cream to keep your skin moisturized.

## 2016-05-26 NOTE — Assessment & Plan Note (Signed)
Patient to see vascular surgery in about 3 months. No symptoms with walking. - continue ASA, Lipitor - patient is cutting down on her smoking

## 2016-05-26 NOTE — Assessment & Plan Note (Signed)
Currently has a flare. Is out of Clobetasol which usually improves symptoms per patient. Discussed appropriate use of Clobetasol (only during flare) and the importance of medication holiday when symptoms resolve. Patient understands this. Also encouraged to use Aquaphor and Vaseline for moisturization.

## 2016-05-30 ENCOUNTER — Telehealth: Payer: Self-pay | Admitting: Vascular Surgery

## 2016-05-30 NOTE — Telephone Encounter (Signed)
-----   Message from Mena Goes, RN sent at 04/15/2016  2:56 PM EDT ----- Regarding: schedule   ----- Message ----- From: Gabriel Earing, PA-C Sent: 04/15/2016  11:00 AM To: [facility] Charge Pool  Pt to f/u in 3 months with Dr. Donzetta Matters.  Pt will need ABI's at that time.  Thanks, Aldona Bar

## 2016-05-30 NOTE — Telephone Encounter (Signed)
Home # rings busy, mailing letter for appt on 11/17 [place] & f/u with Doc

## 2016-06-11 DIAGNOSIS — H524 Presbyopia: Secondary | ICD-10-CM | POA: Diagnosis not present

## 2016-06-11 DIAGNOSIS — H5203 Hypermetropia, bilateral: Secondary | ICD-10-CM | POA: Diagnosis not present

## 2016-06-11 DIAGNOSIS — H2513 Age-related nuclear cataract, bilateral: Secondary | ICD-10-CM | POA: Diagnosis not present

## 2016-06-11 DIAGNOSIS — H04123 Dry eye syndrome of bilateral lacrimal glands: Secondary | ICD-10-CM | POA: Diagnosis not present

## 2016-06-17 ENCOUNTER — Other Ambulatory Visit: Payer: Self-pay | Admitting: Internal Medicine

## 2016-06-17 ENCOUNTER — Other Ambulatory Visit: Payer: Self-pay | Admitting: Family Medicine

## 2016-07-15 ENCOUNTER — Telehealth: Payer: Self-pay | Admitting: Internal Medicine

## 2016-07-15 NOTE — Telephone Encounter (Signed)
[name] from [facility] [place] needs a [facility] referral. Please advise. Thanks! ep

## 2016-07-16 NOTE — Telephone Encounter (Signed)
Completed. Craig Staggers # [id number]

## 2016-07-17 ENCOUNTER — Encounter: Payer: Self-pay | Admitting: Vascular Surgery

## 2016-07-18 ENCOUNTER — Ambulatory Visit (HOSPITAL_COMMUNITY): Payer: Commercial Managed Care - HMO

## 2016-07-18 ENCOUNTER — Telehealth: Payer: Self-pay | Admitting: Vascular Surgery

## 2016-07-18 ENCOUNTER — Other Ambulatory Visit: Payer: Self-pay | Admitting: Family Medicine

## 2016-07-18 ENCOUNTER — Ambulatory Visit: Payer: Commercial Managed Care - HMO | Admitting: Vascular Surgery

## 2016-07-18 NOTE — Telephone Encounter (Signed)
-----   Message from Orlene Plum sent at 07/18/2016 11:06 AM EST ----- Regarding: cancel today Patient cannot get a ride for today's appointment.  Please call her to reschedule.

## 2016-07-18 NOTE — Telephone Encounter (Signed)
Called and spoke with patient and rsc her appt from today.

## 2016-09-04 ENCOUNTER — Other Ambulatory Visit: Payer: Self-pay | Admitting: Internal Medicine

## 2016-09-23 ENCOUNTER — Encounter: Payer: Self-pay | Admitting: Vascular Surgery

## 2016-09-26 ENCOUNTER — Ambulatory Visit (INDEPENDENT_AMBULATORY_CARE_PROVIDER_SITE_OTHER): Payer: Medicare HMO | Admitting: Physician Assistant

## 2016-09-26 ENCOUNTER — Ambulatory Visit (HOSPITAL_COMMUNITY)
Admission: RE | Admit: 2016-09-26 | Discharge: 2016-09-26 | Disposition: A | Discharge status: Home / Self Care | Payer: Medicare HMO | Source: Ambulatory Visit | Attending: Vascular Surgery | Admitting: Vascular Surgery

## 2016-09-26 VITALS — BP 140/78 | HR 68 | Temp 97.6°F | Resp 18 | Ht 66.0 in | Wt 168.0 lb

## 2016-09-26 DIAGNOSIS — I739 Peripheral vascular disease, unspecified: Secondary | ICD-10-CM

## 2016-09-26 DIAGNOSIS — L98491 Non-pressure chronic ulcer of skin of other sites limited to breakdown of skin: Secondary | ICD-10-CM | POA: Diagnosis not present

## 2016-09-26 NOTE — Progress Notes (Signed)
HISTORY AND PHYSICAL     CC:  Follow up ABI's Referring Provider:  Smiley Houseman, MD  HPI: This is a 66 y.o. female who was seen by [facility] in the hospital in August 2017 for hx of right thigh pain.  This also includes her right hip and buttock region and has progressively worsened over the past week. She also says that her right toes hurt and feels like she stubbed them. She states that her pain interferes with walking. She denies a history of DVT.   On ROS, the patient states that she has lost 45 pounds in the past year and has been having night sweats. There is a family history of malignancy. She has had a colonoscopy and mammogram in recent years but is due for these again.  She states she has continued to lose some weight.  She still has not yet gotten her colonoscopy but does have an appointment with her PCP in the next couple of weeks.   She states that she has been getting sick again with nausea and vomiting and was given Phenergan about a month ago.    She states that she fell about 2 months ago onto her left knee.  She has fallen a couple of times since then as well.  She states that she did not pass out or lose consciousness.  Since then she feels like there is swelling around her ankle and feels tight with her shoes on.  She states that her right leg is unchanged.  She does say she has a non healing wound between the right 4th and 5th toes that has been present for a couple of months.  She states this gets better then gets worse.    She does continue to smoke.  She is on a statin for cholesterol management.  She is on a daily aspirin.  Past Medical History:  Diagnosis Date  . Asthma   . COPD (chronic obstructive pulmonary disease) ([place])   . Hx of colonic polyps   . Nausea & vomiting   . PSORIASIS 12/29/2006   Qualifier: Diagnosis of  By: Jobe Igo MD, Shanon Brow    . Smoker     Past Surgical History:  Procedure Laterality Date  . APPENDECTOMY    . COLONOSCOPY W/ POLYPECTOMY   02/26/2005  . HERNIA REPAIR     x 2  . PARTIAL HYSTERECTOMY    . TONSILLECTOMY    . UPPER GASTROINTESTINAL ENDOSCOPY  03/22/2010, 02/13/2005    No Known Allergies  Current Outpatient Prescriptions  Medication Sig Dispense Refill  . atorvastatin (LIPITOR) 80 MG tablet take 1 tablet by mouth once daily AT 6 PM 30 tablet 1  . clobetasol cream (TEMOVATE) AB-123456789 % Apply 1 application topically 2 (two) times daily as needed. 30 g 0  . promethazine (PHENERGAN) 25 MG tablet take 1 tablet by mouth every 6 hours if needed for nausea and vomiting 30 tablet 2  . RA ASPIRIN EC ADULT LOW ST 81 MG EC tablet take 1 tablet by mouth once daily 30 tablet 0  . VENTOLIN HFA 108 (90 Base) MCG/ACT inhaler inhale 2 puffs by mouth every 4 hours if needed for wheezing or shortness of breath 18 Inhaler 1   No current facility-administered medications for this visit.     Family History  Problem Relation Age of Onset  . Cancer Other   . Coronary artery disease Other   . Cancer Mother   . Hypertension Mother   . Heart disease  Mother   . Cancer Father   . Hypertension Father   . Cancer Sister   . Hypertension Sister     Social History   Social History  . Marital status: Single    Spouse name: N/A  . Number of children: N/A  . Years of education: N/A   Occupational History  . Not on file.   Social History Main Topics  . Smoking status: Current Some Day Smoker    Packs/day: 0.25    Types: Cigarettes  . Smokeless tobacco: Never Used  . Alcohol use No     Comment: Quit alcohol in 1992  . Drug use: Yes    Types: Marijuana  . Sexual activity: No   Other Topics Concern  . Not on file   Social History Narrative  . No narrative on file     REVIEW OF SYSTEMS:   [X]  denotes positive finding, [ ]  denotes negative finding Cardiac  Comments:  Chest pain or chest pressure:    Shortness of breath upon exertion: x   Short of breath when lying flat: x   Irregular heart rhythm:        Vascular      Pain in calf, thigh, or hip brought on by ambulation: x   Pain in feet at night that wakes you up from your sleep:  x   Blood clot in your veins:    Leg swelling:         Pulmonary    Oxygen at home:    Productive cough:     Wheezing:         Neurologic    Sudden weakness in arms or legs:     Sudden numbness in arms or legs:     Sudden onset of difficulty speaking or slurred speech:    Temporary loss of vision in one eye:     Problems with dizziness:         Gastrointestinal    Blood in stool:     Vomited blood:         Genitourinary    Burning when urinating:     Blood in urine:        Psychiatric    Major depression:         Hematologic    Bleeding problems:    Problems with blood clotting too easily:        Skin    Rashes or ulcers:        Constitutional    Fever or chills:      PHYSICAL EXAMINATION:  Vitals:   09/26/16 1333  BP: 140/78  Pulse: 68  Resp: 18  Temp: 97.6 F (36.4 C)   Body mass index is 27.12 kg/m.  General:  WDWN in NAD; vital signs documented above Gait: Normal HENT: WNL, normocephalic Pulmonary: normal non-labored breathing , without Rales, rhonchi,  wheezing Cardiac: regular HR, without  Murmurs, rubs or gallops; without carotid bruit  Abdomen: soft, NT, no masses Skin: without rashes Vascular Exam/Pulses:  Right Left  Radial 2+ (normal) 2+ (normal)  Ulnar Unable to palpate  Unable to palpate   Femoral 2+ (normal) 2+ (normal)  Popliteal Unable to palpate  Unable to palpate   DP Monophasic doppler signal 2+ (normal)  PT Monophasic doppler signal 2+ (normal)  Peroneal Monophasic doppler signal    Extremities: small superficial non healing wound between the 4th and 5th toes of the right foot. Musculoskeletal: no muscle wasting or atrophy  Neurologic: A&O  X 3;  No focal weakness or paresthesias are detected Psychiatric:  The pt has Normal affect.   Non-Invasive Vascular Imaging:   ABI's on 09/26/16: Right:   0.76 Left:  1.04  Previous ABI's on 04/13/16: Right:  0.5 Left:  1.04  Pt meds includes: Statin:  Yes.   Beta Blocker:  No. Aspirin:  Yes.   ACEI:  No. ARB:  No. CCB use:  No Other Antiplatelet/Anticoagulant:  No   ASSESSMENT/PLAN:: 66 y.o. female with hx of right thigh/buttock pain in setting of PAD   -pt now has a non healing wound between the right 4th and 5th toes that has been present for a couple of months.   -her ABI's are improved, however, her doppler signals in the right foot are dampened/monophasic signals.  -given the fact she has had a non healing wound for a while, I will set her up for an aortogram with BLE runoff possible RLE intervention by Dr. Donzetta Matters.  I discussed this with the patient and she is willing to proceed.   -she has been having more nausea/vomiting in the past month-she does have an appointment for a well check in the next week or two.  Encouraged her to f/u on her colonoscopy. -discussed importance of smoking cessation with pt   Leontine Locket, PA-C [facility] [phone]  Clinic MD:  Bridgett Larsson

## 2016-09-29 ENCOUNTER — Other Ambulatory Visit: Payer: Self-pay

## 2016-10-06 ENCOUNTER — Encounter (HOSPITAL_COMMUNITY)
Admission: RE | Disposition: A | Discharge status: Home / Self Care | Payer: Self-pay | Source: Ambulatory Visit | Attending: Vascular Surgery

## 2016-10-06 ENCOUNTER — Encounter (HOSPITAL_COMMUNITY): Payer: Self-pay | Admitting: Cardiology

## 2016-10-06 ENCOUNTER — Ambulatory Visit (HOSPITAL_COMMUNITY)
Admission: RE | Admit: 2016-10-06 | Discharge: 2016-10-06 | Disposition: A | Discharge status: Home / Self Care | Payer: Medicare HMO | Source: Ambulatory Visit | Attending: Vascular Surgery | Admitting: Vascular Surgery

## 2016-10-06 DIAGNOSIS — Z7982 Long term (current) use of aspirin: Secondary | ICD-10-CM | POA: Diagnosis not present

## 2016-10-06 DIAGNOSIS — F17210 Nicotine dependence, cigarettes, uncomplicated: Secondary | ICD-10-CM | POA: Insufficient documentation

## 2016-10-06 DIAGNOSIS — Z8249 Family history of ischemic heart disease and other diseases of the circulatory system: Secondary | ICD-10-CM | POA: Diagnosis not present

## 2016-10-06 DIAGNOSIS — I70235 Atherosclerosis of native arteries of right leg with ulceration of other part of foot: Secondary | ICD-10-CM | POA: Insufficient documentation

## 2016-10-06 DIAGNOSIS — L97519 Non-pressure chronic ulcer of other part of right foot with unspecified severity: Secondary | ICD-10-CM | POA: Insufficient documentation

## 2016-10-06 DIAGNOSIS — F1290 Cannabis use, unspecified, uncomplicated: Secondary | ICD-10-CM | POA: Insufficient documentation

## 2016-10-06 DIAGNOSIS — J449 Chronic obstructive pulmonary disease, unspecified: Secondary | ICD-10-CM | POA: Insufficient documentation

## 2016-10-06 DIAGNOSIS — I70245 Atherosclerosis of native arteries of left leg with ulceration of other part of foot: Secondary | ICD-10-CM | POA: Diagnosis present

## 2016-10-06 DIAGNOSIS — I998 Other disorder of circulatory system: Secondary | ICD-10-CM | POA: Diagnosis not present

## 2016-10-06 HISTORY — PX: PERIPHERAL VASCULAR INTERVENTION: CATH118257

## 2016-10-06 HISTORY — PX: ABDOMINAL AORTOGRAM W/LOWER EXTREMITY: CATH118223

## 2016-10-06 HISTORY — PX: PERIPHERAL VASCULAR ATHERECTOMY: CATH118256

## 2016-10-06 LAB — POCT ACTIVATED CLOTTING TIME
ACTIVATED CLOTTING TIME: 235 s
ACTIVATED CLOTTING TIME: 208 s
ACTIVATED CLOTTING TIME: 191 s
Activated Clotting Time: 219 seconds
Activated Clotting Time: 180 seconds

## 2016-10-06 LAB — POCT I-STAT, CHEM 8
BUN: 13 mg/dL (ref 6–20)
CALCIUM ION: 1.18 mmol/L (ref 1.15–1.40)
CREATININE: 0.9 mg/dL (ref 0.44–1.00)
Chloride: 104 mmol/L (ref 101–111)
GLUCOSE: 94 mg/dL (ref 65–99)
HCT: 39 % (ref 36.0–46.0)
HEMOGLOBIN: 13.3 g/dL (ref 12.0–15.0)
Potassium: 3.8 mmol/L (ref 3.5–5.1)
Sodium: 143 mmol/L (ref 135–145)
TCO2: 25 mmol/L (ref 0–100)

## 2016-10-06 SURGERY — ABDOMINAL AORTOGRAM W/LOWER EXTREMITY
Laterality: Right

## 2016-10-06 MED ORDER — MORPHINE SULFATE (PF) 4 MG/ML IV SOLN
INTRAVENOUS | Status: AC
  Filled 2016-10-06: qty 1

## 2016-10-06 MED ORDER — SODIUM CHLORIDE 0.9 % IV SOLN
INTRAVENOUS | Status: DC
  Administered 2016-10-06: 12:00:00 via INTRAVENOUS

## 2016-10-06 MED ORDER — FENTANYL CITRATE (PF) 100 MCG/2ML IJ SOLN
INTRAMUSCULAR | Status: AC
  Filled 2016-10-06: qty 2

## 2016-10-06 MED ORDER — SODIUM CHLORIDE 0.9 % IV SOLN: 1.0000 mL/kg/h | INTRAVENOUS | Status: DC

## 2016-10-06 MED ORDER — MIDAZOLAM HCL 2 MG/2ML IJ SOLN
INTRAMUSCULAR | Status: DC | PRN
  Administered 2016-10-06 (×2): 1 mg via INTRAVENOUS

## 2016-10-06 MED ORDER — HEPARIN (PORCINE) IN NACL 2-0.9 UNIT/ML-% IJ SOLN
INTRAMUSCULAR | Status: AC
  Filled 2016-10-06: qty 1000

## 2016-10-06 MED ORDER — LIDOCAINE HCL (PF) 1 % IJ SOLN
INTRAMUSCULAR | Status: DC | PRN
  Administered 2016-10-06: 8 mL
  Administered 2016-10-06: 12 mL

## 2016-10-06 MED ORDER — FENTANYL CITRATE (PF) 100 MCG/2ML IJ SOLN
INTRAMUSCULAR | Status: DC | PRN
  Administered 2016-10-06 (×4): 50 ug via INTRAVENOUS

## 2016-10-06 MED ORDER — CLOPIDOGREL BISULFATE 300 MG PO TABS
ORAL_TABLET | ORAL | Status: AC
Start: 2016-10-06 — End: ?
  Filled 2016-10-06: qty 1

## 2016-10-06 MED ORDER — MORPHINE SULFATE (PF) 4 MG/ML IV SOLN
2.0000 mg | INTRAVENOUS | Status: DC | PRN
  Administered 2016-10-06: 2 mg via INTRAVENOUS

## 2016-10-06 MED ORDER — CLOPIDOGREL BISULFATE 75 MG PO TABS: 75.0000 mg | ORAL_TABLET | Freq: Every day | ORAL | 3 refills | Status: DC

## 2016-10-06 MED ORDER — IODIXANOL 320 MG/ML IV SOLN
INTRAVENOUS | Status: DC | PRN
  Administered 2016-10-06: 120 mL via INTRA_ARTERIAL

## 2016-10-06 MED ORDER — ONDANSETRON HCL 4 MG/2ML IJ SOLN
INTRAMUSCULAR | Status: AC
  Filled 2016-10-06: qty 2

## 2016-10-06 MED ORDER — CLOPIDOGREL BISULFATE 75 MG PO TABS
300.0000 mg | ORAL_TABLET | Freq: Once | ORAL | Status: AC
  Administered 2016-10-06: 300 mg via ORAL

## 2016-10-06 MED ORDER — HYDRALAZINE HCL 20 MG/ML IJ SOLN
INTRAMUSCULAR | Status: AC
  Filled 2016-10-06: qty 1

## 2016-10-06 MED ORDER — OXYCODONE-ACETAMINOPHEN 5-325 MG PO TABS: 1.0000 | ORAL_TABLET | ORAL | Status: DC | PRN

## 2016-10-06 MED ORDER — MIDAZOLAM HCL 2 MG/2ML IJ SOLN
INTRAMUSCULAR | Status: AC
  Filled 2016-10-06: qty 2

## 2016-10-06 MED ORDER — LIDOCAINE HCL (PF) 1 % IJ SOLN
INTRAMUSCULAR | Status: AC
  Filled 2016-10-06: qty 30

## 2016-10-06 MED ORDER — HEPARIN (PORCINE) IN NACL 2-0.9 UNIT/ML-% IJ SOLN
INTRAMUSCULAR | Status: DC | PRN
  Administered 2016-10-06: 1000 mL via INTRA_ARTERIAL

## 2016-10-06 MED ORDER — HEPARIN SODIUM (PORCINE) 1000 UNIT/ML IJ SOLN
INTRAMUSCULAR | Status: DC | PRN
  Administered 2016-10-06: 7000 [IU] via INTRAVENOUS
  Administered 2016-10-06: 3000 [IU] via INTRAVENOUS

## 2016-10-06 MED ORDER — HYDRALAZINE HCL 20 MG/ML IJ SOLN
20.0000 mg | Freq: Once | INTRAMUSCULAR | Status: AC
  Administered 2016-10-06: 20 mg via INTRAVENOUS

## 2016-10-06 MED ORDER — HEPARIN SODIUM (PORCINE) 1000 UNIT/ML IJ SOLN
INTRAMUSCULAR | Status: AC
  Filled 2016-10-06: qty 1

## 2016-10-06 MED ORDER — ONDANSETRON HCL 4 MG/2ML IJ SOLN
4.0000 mg | Freq: Once | INTRAMUSCULAR | Status: AC
  Administered 2016-10-06: 4 mg via INTRAVENOUS

## 2016-10-06 SURGICAL SUPPLY — 30 items
BAG SNAP BAND KOVER 36X36 (MISCELLANEOUS) ×2 IMPLANT
BALLN IN.PACT DCB 6X150 (BALLOONS) ×4
BALLN MUSTANG 5X150X135 (BALLOONS) ×4
BALLOON MUSTANG 5X150X135 (BALLOONS) IMPLANT
BUR JETSTREAM XC 2.1/3.0 (BURR) IMPLANT
BURR JETSTREAM XC 2.1/3.0 (BURR) ×3
BURR JETSTREAM XC 2.1MM/3.0MM (BURR) ×1
CATH ANGIO 5F BER2 65CM (CATHETERS) ×2 IMPLANT
CATH OMNI FLUSH 5F 65CM (CATHETERS) ×2 IMPLANT
CATH QUICKCROSS SUPP .035X90CM (MICROCATHETER) ×2 IMPLANT
COVER DOME SNAP 22 D (MISCELLANEOUS) ×2 IMPLANT
COVER PRB 48X5XTLSCP FOLD TPE (BAG) IMPLANT
COVER PROBE 5X48 (BAG) ×12
DCB IN.PACT 6X150 (BALLOONS) IMPLANT
DEVICE CONTINUOUS FLUSH (MISCELLANEOUS) ×2 IMPLANT
DEVICE TORQUE .025-.038 (MISCELLANEOUS) ×2 IMPLANT
GUIDEWIRE ANGLED .035X260CM (WIRE) ×2 IMPLANT
KIT ENCORE 26 ADVANTAGE (KITS) ×2 IMPLANT
KIT PV (KITS) ×4 IMPLANT
SHEATH DESTINATION MP 5FR 45CM (SHEATH) ×2 IMPLANT
SHEATH PINNACLE 5F 10CM (SHEATH) ×2 IMPLANT
SHEATH PINNACLE MP 7F 45CM (SHEATH) ×2 IMPLANT
STENT INNOVA 6X60X130 (Permanent Stent) ×2 IMPLANT
SYR MEDRAD MARK V 150ML (SYRINGE) ×4 IMPLANT
TRANSDUCER W/STOPCOCK (MISCELLANEOUS) ×4 IMPLANT
TRAY PV CATH (CUSTOM PROCEDURE TRAY) ×4 IMPLANT
WIRE BENTSON .035X145CM (WIRE) ×2 IMPLANT
WIRE MINI STICK MAX (SHEATH) ×2 IMPLANT
WIRE ROSEN-J .035X260CM (WIRE) ×2 IMPLANT
WIRE SPARTACORE .014X300CM (WIRE) ×2 IMPLANT

## 2016-10-06 NOTE — H&P (Signed)
HISTORY AND PHYSICAL     CC:  Follow up ABI's Referring Provider:  Smiley Houseman, MD  HPI: This is a 66 y.o. female who was seen by [facility] in the hospital in August 2017 for hx of right thigh pain.  This also includes her right hip and buttock region and has progressively worsened over the past week. She also says that her right toes hurt and feels like she stubbed them. She states that her pain interferes with walking. She denies a history of DVT.   On ROS, the patient states that she has lost 45 pounds in the past year and has been having night sweats. There is a family history of malignancy. She has had a colonoscopy and mammogram in recent years but is due for these again.  She states she has continued to lose some weight.  She still has not yet gotten her colonoscopy but does have an appointment with her PCP in the next couple of weeks.   She states that she has been getting sick again with nausea and vomiting and was given Phenergan about a month ago.    She states that she fell about 2 months ago onto her left knee.  She has fallen a couple of times since then as well.  She states that she did not pass out or lose consciousness.  Since then she feels like there is swelling around her ankle and feels tight with her shoes on.  She states that her right leg is unchanged.  She does say she has a non healing wound between the right 4th and 5th toes that has been present for a couple of months.  She states this gets better then gets worse.    She does continue to smoke.  She is on a statin for cholesterol management.  She is on a daily aspirin.      Past Medical History:  Diagnosis Date  . Asthma   . COPD (chronic obstructive pulmonary disease) ([place])   . Hx of colonic polyps   . Nausea & vomiting   . PSORIASIS 12/29/2006   Qualifier: Diagnosis of  By: Jobe Igo MD, Shanon Brow    . Smoker          Past Surgical History:  Procedure Laterality Date  . APPENDECTOMY    .  COLONOSCOPY W/ POLYPECTOMY  02/26/2005  . HERNIA REPAIR     x 2  . PARTIAL HYSTERECTOMY    . TONSILLECTOMY    . UPPER GASTROINTESTINAL ENDOSCOPY  03/22/2010, 02/13/2005    No Known Allergies        Current Outpatient Prescriptions  Medication Sig Dispense Refill  . atorvastatin (LIPITOR) 80 MG tablet take 1 tablet by mouth once daily AT 6 PM 30 tablet 1  . clobetasol cream (TEMOVATE) AB-123456789 % Apply 1 application topically 2 (two) times daily as needed. 30 g 0  . promethazine (PHENERGAN) 25 MG tablet take 1 tablet by mouth every 6 hours if needed for nausea and vomiting 30 tablet 2  . RA ASPIRIN EC ADULT LOW ST 81 MG EC tablet take 1 tablet by mouth once daily 30 tablet 0  . VENTOLIN HFA 108 (90 Base) MCG/ACT inhaler inhale 2 puffs by mouth every 4 hours if needed for wheezing or shortness of breath 18 Inhaler 1   No current facility-administered medications for this visit.          Family History  Problem Relation Age of Onset  . Cancer Other   .  Coronary artery disease Other   . Cancer Mother   . Hypertension Mother   . Heart disease Mother   . Cancer Father   . Hypertension Father   . Cancer Sister   . Hypertension Sister     Social History        Social History  . Marital status: Single    Spouse name: N/A  . Number of children: N/A  . Years of education: N/A      Occupational History  . Not on file.         Social History Main Topics  . Smoking status: Current Some Day Smoker    Packs/day: 0.25    Types: Cigarettes  . Smokeless tobacco: Never Used  . Alcohol use No     Comment: Quit alcohol in 1992  . Drug use: Yes    Types: Marijuana  . Sexual activity: No       Other Topics Concern  . Not on file      Social History Narrative  . No narrative on file     REVIEW OF SYSTEMS:   [X]  denotes positive finding, [ ]  denotes negative finding Cardiac  Comments:  Chest pain or chest pressure:      Shortness of breath upon exertion: x   Short of breath when lying flat: x   Irregular heart rhythm:        Vascular    Pain in calf, thigh, or hip brought on by ambulation: x   Pain in feet at night that wakes you up from your sleep:  x   Blood clot in your veins:    Leg swelling:         Pulmonary    Oxygen at home:    Productive cough:     Wheezing:         Neurologic    Sudden weakness in arms or legs:     Sudden numbness in arms or legs:     Sudden onset of difficulty speaking or slurred speech:    Temporary loss of vision in one eye:     Problems with dizziness:         Gastrointestinal    Blood in stool:     Vomited blood:         Genitourinary    Burning when urinating:     Blood in urine:        Psychiatric    Major depression:         Hematologic    Bleeding problems:    Problems with blood clotting too easily:        Skin    Rashes or ulcers:        Constitutional    Fever or chills:      PHYSICAL EXAMINATION:  Vitals:   09/26/16 1333  BP: 140/78  Pulse: 68  Resp: 18  Temp: 97.6 F (36.4 C)   Body mass index is 27.12 kg/m.  General:  WDWN in NAD; vital signs documented above Gait: Normal HENT: WNL, normocephalic Pulmonary: normal non-labored breathing , without Rales, rhonchi,  wheezing Cardiac: regular HR, without  Murmurs, rubs or gallops; without carotid bruit  Abdomen: soft, NT, no masses Skin: without rashes Vascular Exam/Pulses:  Right Left  Radial 2+ (normal) 2+ (normal)  Ulnar Unable to palpate  Unable to palpate   Femoral 2+ (normal) 2+ (normal)  Popliteal Unable to palpate  Unable to palpate   DP Monophasic doppler signal 2+ (normal)  PT Monophasic doppler signal 2+ (normal)  Peroneal Monophasic doppler signal    Extremities: small superficial non healing wound between the 4th and 5th toes of the right  foot. Musculoskeletal: no muscle wasting or atrophy       Neurologic: A&O X 3;  No focal weakness or paresthesias are detected Psychiatric:  The pt has Normal affect.   Non-Invasive Vascular Imaging:   ABI's on 09/26/16: Right:  0.76 Left:  1.04  Previous ABI's on 04/13/16: Right:  0.5 Left:  1.04  Pt meds includes: Statin:  Yes.   Beta Blocker:  No. Aspirin:  Yes.   ACEI:  No. ARB:  No. CCB use:  No Other Antiplatelet/Anticoagulant:  No   ASSESSMENT/PLAN:: 66 y.o. female with hx of right thigh/buttock pain in setting of PAD with toe ulceration. Plan for aortogram with ble runoff and possible intervention.  Jesilyn Easom C. Donzetta Matters, MD [facility] [place] Office: [phone] Pager: [phone]

## 2016-10-06 NOTE — Discharge Instructions (Signed)
Femoral Site Care °Introduction °Refer to this sheet in the next few weeks. These instructions provide you with information about caring for yourself after your procedure. Your health care provider may also give you more specific instructions. Your treatment has been planned according to current medical practices, but problems sometimes occur. Call your health care provider if you have any problems or questions after your procedure. °What can I expect after the procedure? °After your procedure, it is typical to have the following: °· Bruising at the site that usually fades within 1-2 weeks. °· Blood collecting in the tissue (hematoma) that may be painful to the touch. It should usually decrease in size and tenderness within 1-2 weeks. °Follow these instructions at home: °· Take medicines only as directed by your health care provider. °· You may shower 24-48 hours after the procedure or as directed by your health care provider. Remove the bandage (dressing) and gently wash the site with plain soap and water. Pat the area dry with a clean towel. Do not rub the site, because this may cause bleeding. °· Do not take baths, swim, or use a hot tub until your health care provider approves. °· Check your insertion site every day for redness, swelling, or drainage. °· Do not apply powder or lotion to the site. °· Limit use of stairs to twice a day for the first 2-3 days or as directed by your health care provider. °· Do not squat for the first 2-3 days or as directed by your health care provider. °· Do not lift over 10 lb (4.5 kg) for 5 days after your procedure or as directed by your health care provider. °· Ask your health care provider when it is okay to: °¨ Return to work or school. °¨ Resume usual physical activities or sports. °¨ Resume sexual activity. °· Do not drive home if you are discharged the same day as the procedure. Have someone else drive you. °· You may drive 24 hours after the procedure unless otherwise  instructed by your health care provider. °· Do not operate machinery or power tools for 24 hours after the procedure or as directed by your health care provider. °· If your procedure was done as an outpatient procedure, which means that you went home the same day as your procedure, a responsible adult should be with you for the first 24 hours after you arrive home. °· Keep all follow-up visits as directed by your health care provider. This is important. °Contact a health care provider if: °· You have a fever. °· You have chills. °· You have increased bleeding from the site. Hold pressure on the site. °Get help right away if: °· You have unusual pain at the site. °· You have redness, warmth, or swelling at the site. °· You have drainage (other than a small amount of blood on the dressing) from the site. °· The site is bleeding, and the bleeding does not stop after 30 minutes of holding steady pressure on the site. °· Your leg or foot becomes pale, cool, tingly, or numb. °This information is not intended to replace advice given to you by your health care provider. Make sure you discuss any questions you have with your health care provider. °Document Released: 04/21/2014 Document Revised: 01/24/2016 Document Reviewed: 03/07/2014 °© 2017 [employer]vier ° °

## 2016-10-06 NOTE — Op Note (Signed)
    Patient name: [name] MRN: [id number] DOB: 05/12/1951 Sex: female  10/06/2016 Pre-operative Diagnosis: critical right leg ischemia with ulcer Post-operative diagnosis:  Same Surgeon:  Erlene Quan C. Donzetta Matters, MD Procedure Performed: 1.  US guided cannulation of left common femoral artery 2.  Aortogram with bilateral lower extremity runoff 3.  Atherectomy of right sfa with jetstream 4.  Drug coated balloon angioplasty of right sfa with 27mm impact 5.  Stent of proximal right sfa for dissection wth 6 x 60 innova 6. Moderate sedation for 148 minutes with fentanyl and versed  Indications:  66 year old female presents with critical right lower extremity ischemia. She has an ulcer between her right fourth and fifth toes as indicated for aortogram with runoff possible intervention.  Findings: The SFA was occluded just distal to the takeoff and reconstituted. The adductor canal. Following trueluminal cannulation performed atherectomy with a jetstream followed by drug coated balloon antroplasty. The vessel had previously been occluded now had a spiral dissection throughout the treated segment. For this we placed a stent at the proximal aspect. The SFA still had residual dissection with brisk runoff without disease in the tibials dominant with the posterior tibial artery filling the arch in the foot.  Procedure:  The patient was identified in the holding area and taken to room 8.  The patient was then placed supine on the table and prepped and draped in the usual sterile fashion.  A time out was called.  Ultrasound was used to evaluate the left common femoral artery.  It was patent .  A digital ultrasound image was acquired.  A micropuncture needle was used to access the left common femoral artery under ultrasound guidance.  An 018 wire was advanced without resistance and a micropuncture sheath was placed.  The 018 wire was removed and a benson wire was placed.  The micropuncture sheath was exchanged for a 5  french sheath.  An omniflush catheter was advanced over the wire to the level of L-1.  An abdominal angiogram was obtained follwed by bilateral runoff..  Next, using the omniflush catheter and a benson wire, the aortic bifurcation was crossed and the catheter was placed into theright femoral artery and we then exchanged for long 5 French sheath. Patient was heparinized. We crossed the lesion with Glidewire quick cross catheter confirmed intraluminal access. Following this we exchanged for a long 7 French sheath into the right common femoral artery. We perform jetstream atherectomy of the right SFA followed by predilation with 5 mm balloon and drug-coated balloon angioplasty with 6 mm balloon. Completion angiogram demonstrated spiral dissection for which we inflated a balloon for 3 minutes at high pressure. We still had residual dissection and so we placed a stent at the takeoff the SFA which was 6 x 60. This was postdilated with 50 L balloon. We then had dissection and residual SFA but elected not to stent given brisk runoff with no flow limitation. We did have runoff through 3 tibial vessels dominant posterior tibial artery filling the arch in the foot. Satisfied catheters and wires were removed we retracted the sheath to the left external iliac artery. This will be removed and postop holding. Patient tolerated procedure well without immediate competition.   Contrast: 120cc   Glenden Rossell C. Donzetta Matters, MD [facility] Office: [phone] Pager: [phone]

## 2016-10-06 NOTE — Progress Notes (Signed)
Site area: 2fr Insurance underwriter Prior to Removal:  Level 0 Pressure Applied For: 88min Manual:   hand Patient Status During Pull:  A/O Post Pull Site:  Level 0 Post Pull Instructions Given:  Instructions given and pt understands Post Pull Pulses Present: Doppler lt Dp Dressing Applied:  4x4 and a Tegaderm Bedrest begins @ 16:25:00 Comments: Pts leg very tender at insertion site. Dr Donzetta Matters in to evaluate pt lt groin and pulses. Lt site intact and no changes to pt orders.Pt leaves cath lab holding in stable condition. Lt fem site is unremarkable.

## 2016-10-07 ENCOUNTER — Encounter (HOSPITAL_COMMUNITY): Payer: Self-pay | Admitting: Vascular Surgery

## 2016-10-07 ENCOUNTER — Other Ambulatory Visit: Payer: Self-pay | Admitting: *Deleted

## 2016-10-07 DIAGNOSIS — L97302 Non-pressure chronic ulcer of unspecified ankle with fat layer exposed: Secondary | ICD-10-CM

## 2016-10-07 MED ORDER — TRAMADOL HCL 50 MG PO TABS: 50.0000 mg | ORAL_TABLET | Freq: Four times a day (QID) | ORAL | 0 refills | Status: DC | PRN

## 2016-10-08 ENCOUNTER — Telehealth: Payer: Self-pay | Admitting: Vascular Surgery

## 2016-10-08 ENCOUNTER — Other Ambulatory Visit: Payer: Self-pay | Admitting: Internal Medicine

## 2016-10-08 NOTE — Telephone Encounter (Signed)
Pt's sister, Aylyn Tarazona, came in to pick up pt's pain medication prescription. Bessie provided pt's address and she provided her ID. Pt's sister was patient while we verify that she was the family member to pick up pt's prescription. We were provided with pt's cell phone number from pt's daughter and we did call, but it went straight to voicemail. The help of Judie Grieve, and Zigmund Daniel we were able to give the sister her sister's pain medication.

## 2016-10-08 NOTE — Addendum Note (Signed)
Addended by: Lianne Cure A on: 10/08/2016 02:09 PM   Modules accepted: Orders

## 2016-10-16 ENCOUNTER — Inpatient Hospital Stay (HOSPITAL_COMMUNITY)
Admission: EM | Admit: 2016-10-16 | Discharge: 2016-10-22 | DRG: 191 | Disposition: A | Discharge status: Home / Self Care | Payer: Medicare HMO | Attending: Family Medicine | Admitting: Family Medicine

## 2016-10-16 ENCOUNTER — Emergency Department (HOSPITAL_COMMUNITY): Payer: Medicare HMO

## 2016-10-16 ENCOUNTER — Encounter (HOSPITAL_COMMUNITY): Payer: Self-pay

## 2016-10-16 DIAGNOSIS — Z86010 Personal history of colonic polyps: Secondary | ICD-10-CM | POA: Diagnosis not present

## 2016-10-16 DIAGNOSIS — Z90710 Acquired absence of both cervix and uterus: Secondary | ICD-10-CM

## 2016-10-16 DIAGNOSIS — I700 Atherosclerosis of aorta: Secondary | ICD-10-CM | POA: Diagnosis not present

## 2016-10-16 DIAGNOSIS — R0602 Shortness of breath: Secondary | ICD-10-CM

## 2016-10-16 DIAGNOSIS — J441 Chronic obstructive pulmonary disease with (acute) exacerbation: Secondary | ICD-10-CM | POA: Diagnosis not present

## 2016-10-16 DIAGNOSIS — Z87891 Personal history of nicotine dependence: Secondary | ICD-10-CM | POA: Diagnosis not present

## 2016-10-16 DIAGNOSIS — R0600 Dyspnea, unspecified: Secondary | ICD-10-CM

## 2016-10-16 DIAGNOSIS — Z79899 Other long term (current) drug therapy: Secondary | ICD-10-CM | POA: Diagnosis not present

## 2016-10-16 DIAGNOSIS — R05 Cough: Secondary | ICD-10-CM | POA: Diagnosis not present

## 2016-10-16 DIAGNOSIS — J45901 Unspecified asthma with (acute) exacerbation: Secondary | ICD-10-CM | POA: Diagnosis present

## 2016-10-16 DIAGNOSIS — Z809 Family history of malignant neoplasm, unspecified: Secondary | ICD-10-CM

## 2016-10-16 DIAGNOSIS — R110 Nausea: Secondary | ICD-10-CM | POA: Diagnosis present

## 2016-10-16 DIAGNOSIS — R062 Wheezing: Secondary | ICD-10-CM | POA: Diagnosis not present

## 2016-10-16 DIAGNOSIS — F1290 Cannabis use, unspecified, uncomplicated: Secondary | ICD-10-CM | POA: Diagnosis not present

## 2016-10-16 DIAGNOSIS — Z8249 Family history of ischemic heart disease and other diseases of the circulatory system: Secondary | ICD-10-CM

## 2016-10-16 DIAGNOSIS — I2510 Atherosclerotic heart disease of native coronary artery without angina pectoris: Secondary | ICD-10-CM | POA: Diagnosis not present

## 2016-10-16 DIAGNOSIS — Z90711 Acquired absence of uterus with remaining cervical stump: Secondary | ICD-10-CM | POA: Diagnosis not present

## 2016-10-16 DIAGNOSIS — F17210 Nicotine dependence, cigarettes, uncomplicated: Secondary | ICD-10-CM | POA: Diagnosis not present

## 2016-10-16 DIAGNOSIS — I70209 Unspecified atherosclerosis of native arteries of extremities, unspecified extremity: Secondary | ICD-10-CM | POA: Diagnosis present

## 2016-10-16 DIAGNOSIS — E876 Hypokalemia: Secondary | ICD-10-CM | POA: Diagnosis present

## 2016-10-16 DIAGNOSIS — J449 Chronic obstructive pulmonary disease, unspecified: Secondary | ICD-10-CM | POA: Diagnosis not present

## 2016-10-16 DIAGNOSIS — K219 Gastro-esophageal reflux disease without esophagitis: Secondary | ICD-10-CM | POA: Diagnosis not present

## 2016-10-16 DIAGNOSIS — Z7902 Long term (current) use of antithrombotics/antiplatelets: Secondary | ICD-10-CM

## 2016-10-16 DIAGNOSIS — Z9049 Acquired absence of other specified parts of digestive tract: Secondary | ICD-10-CM

## 2016-10-16 DIAGNOSIS — J45909 Unspecified asthma, uncomplicated: Secondary | ICD-10-CM | POA: Diagnosis not present

## 2016-10-16 DIAGNOSIS — R0603 Acute respiratory distress: Secondary | ICD-10-CM | POA: Diagnosis not present

## 2016-10-16 DIAGNOSIS — J439 Emphysema, unspecified: Secondary | ICD-10-CM | POA: Diagnosis not present

## 2016-10-16 DIAGNOSIS — R079 Chest pain, unspecified: Secondary | ICD-10-CM | POA: Diagnosis not present

## 2016-10-16 LAB — COMPREHENSIVE METABOLIC PANEL
ALK PHOS: 96 U/L (ref 38–126)
ALT: 10 U/L — AB (ref 14–54)
ANION GAP: 13 (ref 5–15)
AST: 24 U/L (ref 15–41)
Albumin: 3.9 g/dL (ref 3.5–5.0)
BILIRUBIN TOTAL: 0.5 mg/dL (ref 0.3–1.2)
BUN: <5 mg/dL — ABNORMAL LOW (ref 6–20)
CALCIUM: 9.2 mg/dL (ref 8.9–10.3)
CO2: 27 mmol/L (ref 22–32)
CREATININE: 0.78 mg/dL (ref 0.44–1.00)
Chloride: 96 mmol/L — ABNORMAL LOW (ref 101–111)
GFR calc non Af Amer: >60 mL/min (ref >60)
GLUCOSE: 124 mg/dL — AB (ref 65–99)
Potassium: 3.2 mmol/L — ABNORMAL LOW (ref 3.5–5.1)
Sodium: 136 mmol/L (ref 135–145)
TOTAL PROTEIN: 8 g/dL (ref 6.5–8.1)

## 2016-10-16 LAB — CBC WITH DIFFERENTIAL/PLATELET
BASOS ABS: 0 10*3/uL (ref 0.0–0.1)
BASOS PCT: 0 %
EOS ABS: 0 10*3/uL (ref 0.0–0.7)
Eosinophils Relative: 0 %
HEMATOCRIT: 36.6 % (ref 36.0–46.0)
Hemoglobin: 11.9 g/dL — ABNORMAL LOW (ref 12.0–15.0)
Lymphocytes Relative: 26 %
Lymphs Abs: 1.6 10*3/uL (ref 0.7–4.0)
MCH: 29.3 pg (ref 26.0–34.0)
MCHC: 32.5 g/dL (ref 30.0–36.0)
MCV: 90.1 fL (ref 78.0–100.0)
MONO ABS: 0.3 10*3/uL (ref 0.1–1.0)
MONOS PCT: 5 %
NEUTROS ABS: 4.2 10*3/uL (ref 1.7–7.7)
NEUTROS PCT: 69 %
Platelets: 340 10*3/uL (ref 150–400)
RBC: 4.06 MIL/uL (ref 3.87–5.11)
RDW: 14 % (ref 11.5–15.5)
WBC: 6.1 10*3/uL (ref 4.0–10.5)

## 2016-10-16 LAB — I-STAT CG4 LACTIC ACID, ED
Lactic Acid, Venous: 1.14 mmol/L (ref 0.5–1.9)
Lactic Acid, Venous: 1.43 mmol/L (ref 0.5–1.9)

## 2016-10-16 MED ORDER — ALBUTEROL SULFATE (2.5 MG/3ML) 0.083% IN NEBU
INHALATION_SOLUTION | RESPIRATORY_TRACT | Status: AC
  Filled 2016-10-16: qty 6

## 2016-10-16 MED ORDER — MAGNESIUM SULFATE 2 GM/50ML IV SOLN
2.0000 g | Freq: Once | INTRAVENOUS | Status: AC
  Administered 2016-10-16: 2 g via INTRAVENOUS
  Filled 2016-10-16: qty 50

## 2016-10-16 MED ORDER — ALBUTEROL (5 MG/ML) CONTINUOUS INHALATION SOLN
10.0000 mg/h | INHALATION_SOLUTION | Freq: Once | RESPIRATORY_TRACT | Status: AC
  Administered 2016-10-16: 10 mg/h via RESPIRATORY_TRACT
  Filled 2016-10-16: qty 20

## 2016-10-16 MED ORDER — ALBUTEROL SULFATE (2.5 MG/3ML) 0.083% IN NEBU
5.0000 mg | INHALATION_SOLUTION | Freq: Once | RESPIRATORY_TRACT | Status: AC
  Administered 2016-10-16: 5 mg via RESPIRATORY_TRACT

## 2016-10-16 MED ORDER — METHYLPREDNISOLONE SODIUM SUCC 125 MG IJ SOLR
125.0000 mg | Freq: Once | INTRAMUSCULAR | Status: AC
  Administered 2016-10-16: 125 mg via INTRAVENOUS
  Filled 2016-10-16: qty 2

## 2016-10-16 NOTE — ED Notes (Signed)
EDP made aware of patient wheezing in all four quadrants and HR of 120.

## 2016-10-16 NOTE — ED Provider Notes (Signed)
[facility] DEPT Provider Note   CSN: [id number] Arrival date & time: 10/16/16  1717     History   Chief Complaint Chief Complaint  Patient presents with  . Asthma  . Cough    HPI [name] is a 67 y.o. female.  The history is provided by the patient. No language interpreter was used.  Asthma  This is a new problem. The problem occurs constantly. The problem has not changed since onset.Associated symptoms include shortness of breath. Nothing aggravates the symptoms. Nothing relieves the symptoms. She has tried nothing for the symptoms. The treatment provided no relief.  Cough  Associated symptoms include shortness of breath. Her past medical history is significant for asthma.  Pt complains of shortness of breath.  Pt has a history of copd and asthma.  Pt reports she ran out of her inhaler 2 weeks ago.   Pt has had a cough for the past 2 days.    Past Medical History:  Diagnosis Date  . Asthma   . COPD (chronic obstructive pulmonary disease) ([place])   . Hx of colonic polyps   . Nausea & vomiting   . PSORIASIS 12/29/2006   Qualifier: Diagnosis of  By: Jobe Igo MD, Shanon Brow    . Smoker     Patient Active Problem List   Diagnosis Date Noted  . 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 Loss of weight   . Dyspepsia   . Lumbar disc herniation   . Abnormal neurological exam 04/11/2016  . Leg pain, medial 04/11/2016  . Right leg pain 04/02/2016  . Orthopnea 09/28/2015  . De Quervain's tenosynovitis, left 11/09/2014  . Hypertensive retinopathy of right eye, grade 1 05/03/2014  . Hypertensive retinopathy of left eye, grade 2 05/03/2014  . Cataracts, bilateral 05/03/2014  . Headache(784.0) 04/29/2014  . Abdominal pain, unspecified site 08/15/2013  . Shortness of breath 08/10/2013  . Cough 07/06/2013  . Protein-calorie malnutrition, severe ([place]) 05/11/2013  . Abdominal pain, epigastric 02/23/2013  . Cyclical vomiting [id number]  . Nausea & vomiting   . Smoker   .  Chronic bronchitis ([place]) 12/29/2006  . COPD 12/29/2006  . GASTROESOPHAGEAL REFLUX DISEASE 12/29/2006  . HIATAL HERNIA WITH REFLUX 12/29/2006  . PSORIASIS 12/29/2006    Past Surgical History:  Procedure Laterality Date  . ABDOMINAL AORTOGRAM W/LOWER EXTREMITY N/A 10/06/2016   Procedure: Abdominal Aortogram w/Lower Extremity;  Surgeon: Waynetta Sandy, MD;  Location: [facility];  Service: Cardiovascular;  Laterality: N/A;  . APPENDECTOMY    . COLONOSCOPY W/ POLYPECTOMY  02/26/2005  . HERNIA REPAIR     x 2  . PARTIAL HYSTERECTOMY    . PERIPHERAL VASCULAR ATHERECTOMY Right 10/06/2016   Procedure: Peripheral Vascular Atherectomy;  Surgeon: Waynetta Sandy, MD;  Location: [facility];  Service: Cardiovascular;  Laterality: Right;  . PERIPHERAL VASCULAR INTERVENTION Right 10/06/2016   Procedure: Peripheral Vascular Intervention;  Surgeon: Waynetta Sandy, MD;  Location: [facility];  Service: Cardiovascular;  Laterality: Right;  SFA  . TONSILLECTOMY    . UPPER GASTROINTESTINAL ENDOSCOPY  03/22/2010, 02/13/2005    OB History    No data available       Home Medications    Prior to Admission medications   Medication Sig Start Date End Date Taking? Authorizing Provider  atorvastatin (LIPITOR) 80 MG tablet take 1 tablet by mouth once daily AT 6 PM 07/18/16  Yes Smiley Houseman, MD  clobetasol cream (TEMOVATE) 0.05 % Apply 1  application topically 2 (two) times daily as needed. Patient taking differently: Apply 1 application topically 2 (two) times daily as needed (irritation).  05/26/16  Yes Smiley Houseman, MD  clopidogrel (PLAVIX) 75 MG tablet Take 1 tablet (75 mg total) by mouth daily. 10/06/16  Yes Waynetta Sandy, MD  guaiFENesin (MUCINEX) 600 MG 12 hr tablet Take 600 mg by mouth 2 (two) times daily as needed for cough or to loosen phlegm.   Yes Historical Provider, MD  promethazine (PHENERGAN) 25 MG tablet take 1 tablet by mouth  every 6 hours if needed for nausea and vomiting 09/05/16  Yes Smiley Houseman, MD  RA ASPIRIN EC ADULT LOW ST 81 MG EC tablet take 1 tablet by mouth once daily 10/09/16  Yes Smiley Houseman, MD  traMADol (ULTRAM) 50 MG tablet Take 1 tablet (50 mg total) by mouth every 6 (six) hours as needed. 10/07/16 10/27/16 Yes Rosetta Posner, MD  VENTOLIN HFA 108 [phone] Base) MCG/ACT inhaler inhale 2 puffs by mouth every 4 hours if needed for wheezing or shortness of breath 10/09/16  Yes Smiley Houseman, MD    Family History Family History  Problem Relation Age of Onset  . Cancer Other   . Coronary artery disease Other   . Cancer Mother   . Hypertension Mother   . Heart disease Mother   . Cancer Father   . Hypertension Father   . Cancer Sister   . Hypertension Sister     Social History Social History  Substance Use Topics  . Smoking status: Former Smoker    Packs/day: 0.25    Types: Cigarettes    Quit date: 10/09/2016  . Smokeless tobacco: Never Used  . Alcohol use No     Comment: Quit alcohol in 1992     Allergies   Patient has no known allergies.   Review of Systems Review of Systems  Respiratory: Positive for cough and shortness of breath.   All other systems reviewed and are negative.    Physical Exam Updated Vital Signs BP 157/86   Pulse 97   Temp 98.4 F (36.9 C) (Oral)   Resp 16   Ht 5' 6.5" (1.689 m)   Wt 72.6 kg   SpO2 100%   BMI 25.44 kg/m   Physical Exam  Constitutional: She appears well-developed and well-nourished. No distress.  HENT:  Head: Normocephalic and atraumatic.  Eyes: Conjunctivae are normal.  Neck: Neck supple.  Cardiovascular: Normal rate and regular rhythm.   No murmur heard. Pulmonary/Chest: No respiratory distress. She has wheezes.  Wheezing and rhonchi all lobes.   Abdominal: Soft. There is no tenderness.  Musculoskeletal: She exhibits no edema.  Neurological: She is alert.  Skin: Skin is warm and dry.  Psychiatric: She has a normal  mood and affect.  Nursing note and vitals reviewed.    ED Treatments / Results  Labs (all labs ordered are listed, but only abnormal results are displayed) Labs Reviewed  COMPREHENSIVE METABOLIC PANEL - Abnormal; Notable for the following:       Result Value   Potassium 3.2 (*)    Chloride 96 (*)    Glucose, Bld 124 (*)    BUN <5 (*)    ALT 10 (*)    All other components within normal limits  CBC WITH DIFFERENTIAL/PLATELET - Abnormal; Notable for the following:    Hemoglobin 11.9 (*)    All other components within normal limits  I-STAT CG4 LACTIC ACID, ED  I-STAT CG4 LACTIC ACID, ED    EKG  EKG Interpretation None       Radiology Dg Chest 2 View  Result Date: 10/16/2016 CLINICAL DATA:  Chest pain, dyspnea, fever and productive cough EXAM: CHEST  2 VIEW COMPARISON:  05/16/2014, 10/12/2012 CXR FINDINGS: The heart size and mediastinal contours are within normal limits. Aortic atherosclerosis without aneurysm. Both lungs are clear. 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 IMPRESSION: No active cardiopulmonary disease. Electronically Signed   By: Ashley Royalty M.D.   On: 10/16/2016 17:51    Procedures Procedures (including critical care time)  Medications Ordered in ED Medications  magnesium sulfate IVPB 2 g 50 mL (not administered)  albuterol (PROVENTIL) (2.5 MG/3ML) 0.083% nebulizer solution 5 mg (5 mg Nebulization Given 10/16/16 1733)  methylPREDNISolone sodium succinate (SOLU-MEDROL) 125 mg/2 mL injection 125 mg (125 mg Intravenous Given 10/16/16 2057)  albuterol (PROVENTIL,VENTOLIN) solution continuous neb (10 mg/hr Nebulization Given 10/16/16 2105)     Initial Impression / Assessment and Plan / ED Course  I have reviewed the triage vital signs and the nursing notes.  Pertinent labs & imaging results that were available during my care of the patient were reviewed by me and considered in my medical decision  making (see chart for details).     Pt given duoneb at triage.  Pt given 1 hour continous neb and has continued wheezing.  Solumedrol IV.  Magnesium ordered.  I will discuss with Family medicine for admssion  Final Clinical Impressions(s) / ED Diagnoses   Final diagnoses:  COPD exacerbation ([place])  Severe asthma with exacerbation, unspecified whether persistent    New Prescriptions New Prescriptions   No medications on file     Fransico Meadow, PA-C 10/16/16 2310    Julianne Rice, MD 10/21/16 1537

## 2016-10-16 NOTE — H&P (Signed)
[facility] Admission History and Physical Service Pager: [phone]  Patient name: [name] Medical record number: [id number] Date of birth: [birth year] Age: 66 y.o. Gender: female  Primary Care Provider: Smiley Houseman, MD Consultants: none Code Status: full  Chief Complaint: SOB  Assessment and Plan: [name] is a 66 y.o. female presenting with dyspnea. PMH is significant for COPD, Asthma, GERD, hx of tobacco abuse, PVD with recent stent in femoral artery  COPD/Asthma overlap syndrome with acute exacerbation- Inciting event URI that started last week, patient ran out of albuterol inhaler at home. S/p CAT, duonebs, solumedrol and magnesium in ED. CXR clear for acute infiltrate.  WBC 6.1, lactic acid 1.14>1.43. Patient afebrile.  -place in observation, attending. Dr. Erin Hearing -monitor on telemetry -vitals per floor -oxygen ordered goal to keep sats >88% -duonebs q4 hours schedule -albuterol nebulizers q2H as needed -check flu, droplet precautions -check EKG -trend troponins -patient will likely need controller medication at discharge -will treat with 5 day course of azithromycin for COPD exacerbation - s/p solumedrol in ED, will continue oral prednisone for 5 day total course.   Hypokalemia- potassium on admission 3.2. Likely low in setting of CAT/albuterol.  -replete with 72meq k-dur twice -check BMP in morning -Check Mg  PVD with recent femoral artery stenting- stent placed last week. 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 female presenting  with SOB, wheezing for past 2 days.   Patient had cold come on about 1 week ago with congestion, runny nose. Has been using inhaler more frequently since. Ran out of inhaler today. Has been audibly wheezing and having a tight feeling in her chest for past 2 days. She currently reports chest tightness and back pain that is worse with inspiration. Denies palpitations. Has been nauseated past two days, unable to eat anything today. Has thrown up several times today. She feels slightly improved with breathing treatments in the ED. Has been a smoker for 40 years but quit smoking 1 week ago after having a stent placed in her femoral artery. Was smoking about 2 cigarettes a day at that point.   Prior to her most recent illness, patient reports having  Review Of Systems: Per HPI with the following additions:   Review of Systems  Constitutional: Negative for chills, diaphoresis and fever.  HENT: Positive for congestion.   Eyes: Negative for blurred vision and double vision.  Respiratory: Positive for cough, shortness of breath and wheezing. Negative for sputum production.   Cardiovascular: Positive for chest pain. Negative for palpitations and leg swelling.  Gastrointestinal: Positive for abdominal pain, nausea and vomiting. Negative for blood in stool, constipation and diarrhea.  Genitourinary: Negative for dysuria.  Musculoskeletal: Positive for back pain.  Neurological: Negative for focal weakness, weakness and headaches.  Psychiatric/Behavioral: Positive for substance abuse.    Patient Active Problem List   Diagnosis Date Noted  . Asthma exacerbation in COPD ([place]) 10/16/2016  . Occlusion of common femoral artery ([place])   . Loss of weight   . Dyspepsia   . Lumbar disc herniation   . Abnormal neurological exam 04/11/2016  . Leg pain, medial 04/11/2016  .  Right leg pain 04/02/2016  . Orthopnea 09/28/2015  . De Quervain's tenosynovitis, left 11/09/2014  . Hypertensive retinopathy of right eye,  grade 1 05/03/2014  . Hypertensive retinopathy of left eye, grade 2 05/03/2014  . Cataracts, bilateral 05/03/2014  . Headache(784.0) 04/29/2014  . Abdominal pain, unspecified site 08/15/2013  . Shortness of breath 08/10/2013  . Cough 07/06/2013  . Protein-calorie malnutrition, severe ([place]) 05/11/2013  . Abdominal pain, epigastric 02/23/2013  . Cyclical vomiting 99991111  . Nausea & vomiting   . Smoker   . Chronic bronchitis ([place]) 12/29/2006  . COPD 12/29/2006  . GASTROESOPHAGEAL REFLUX DISEASE 12/29/2006  . HIATAL HERNIA WITH REFLUX 12/29/2006  . PSORIASIS 12/29/2006    Past Medical History: Past Medical History:  Diagnosis Date  . Asthma   . COPD (chronic obstructive pulmonary disease) ([place])   . Hx of colonic polyps   . Nausea & vomiting   . PSORIASIS 12/29/2006   Qualifier: Diagnosis of  By: Jobe Igo MD, Shanon Brow    . Smoker     Past Surgical History: Past Surgical History:  Procedure Laterality Date  . ABDOMINAL AORTOGRAM W/LOWER EXTREMITY N/A 10/06/2016   Procedure: Abdominal Aortogram w/Lower Extremity;  Surgeon: Waynetta Sandy, MD;  Location: [facility];  Service: Cardiovascular;  Laterality: N/A;  . APPENDECTOMY    . COLONOSCOPY W/ POLYPECTOMY  02/26/2005  . HERNIA REPAIR     x 2  . PARTIAL HYSTERECTOMY    . PERIPHERAL VASCULAR ATHERECTOMY Right 10/06/2016   Procedure: Peripheral Vascular Atherectomy;  Surgeon: Waynetta Sandy, MD;  Location: [facility];  Service: Cardiovascular;  Laterality: Right;  . PERIPHERAL VASCULAR INTERVENTION Right 10/06/2016   Procedure: Peripheral Vascular Intervention;  Surgeon: Waynetta Sandy, MD;  Location: [facility];  Service: Cardiovascular;  Laterality: Right;  SFA  . TONSILLECTOMY    . UPPER GASTROINTESTINAL ENDOSCOPY  03/22/2010, 02/13/2005    Social History: Social History  Substance Use Topics  . Smoking status: Former Smoker    Packs/day: 0.25    Types: Cigarettes    Quit  date: 10/09/2016  . Smokeless tobacco: Never Used  . Alcohol use No     Comment: Quit alcohol in 1992   Additional social history: uses marijuana frequently, quit smoking last week, lives alone, sister and daughter live nearby  Please also refer to relevant sections of EMR.  Family History: Family History  Problem Relation Age of Onset  . Cancer Other   . Coronary artery disease Other   . Cancer Mother   . Hypertension Mother   . Heart disease Mother   . Cancer Father   . Hypertension Father   . Cancer Sister   . Hypertension Sister    Allergies and Medications: No Known Allergies No current facility-administered medications on file prior to encounter.    Current Outpatient Prescriptions on File Prior to Encounter  Medication Sig Dispense Refill  . atorvastatin (LIPITOR) 80 MG tablet take 1 tablet by mouth once daily AT 6 PM 30 tablet 1  . clobetasol cream (TEMOVATE) [id number] % Apply 1 application topically 2 (two) times daily as needed. (Patient taking differently: Apply 1 application topically 2 (two) times daily as needed (irritation). ) 30 g 0  . clopidogrel (PLAVIX) 75 MG tablet Take 1 tablet (75 mg total) by mouth daily. 30 tablet 3  . promethazine (PHENERGAN) 25 MG tablet take 1 tablet by mouth every 6 hours if needed for nausea and vomiting 30 tablet 2  .  RA ASPIRIN EC ADULT LOW ST 81 MG EC tablet take 1 tablet by mouth once daily 30 tablet 0  . traMADol (ULTRAM) 50 MG tablet Take 1 tablet (50 mg total) by mouth every 6 (six) hours as needed. 20 tablet 0  . VENTOLIN HFA 108 (90 Base) MCG/ACT inhaler inhale 2 puffs by mouth every 4 hours if needed for wheezing or shortness of breath 18 g 1    Objective: BP 153/81   Pulse 116   Temp 97.8 F (36.6 C) (Oral)   Resp 17   Ht 5' 6.5" (1.689 m)   Wt 160 lb (72.6 kg)   SpO2 96%   BMI 25.44 kg/m  Exam: General: elderly woman laying in bed in mild distress Eyes: PERRL, EOMI ENTM: moist mucous membranes, no erythema or exudate  in oropharynx Neck: supple,non-tender, no lymphadenopathy Cardiovascular: RRR no MRG Respiratory: Upper airway sounds transmitted. diffuse wheezing, fair air movement throughout, mild increased work of breathing. Supraclavicular retractions noted.  Gastrointestinal: soft, tender to palpation in LLQ, non-distended, +BS MSK: no edema or cyanosis Derm: warm, dry, no rashes or lesions noted Neuro: CN2-12 in tact, strength 5/5 throughout, sensation in tact throughout Psych: appropriate mood and affect  Labs and Imaging: CBC BMET   Recent Labs Lab 10/16/16 1733  WBC 6.1  HGB 11.9*  HCT 36.6  PLT 340    Recent Labs Lab 10/16/16 1733  NA 136  K 3.2*  CL 96*  CO2 27  BUN <5*  CREATININE 0.78  GLUCOSE 124*  CALCIUM 9.2     EKG: Sinus tachycardia. No acute ischemic changes.   Dg Chest 2 View  Result Date: 10/16/2016 CLINICAL DATA:  Chest pain, dyspnea, fever and productive cough EXAM: CHEST  2 VIEW COMPARISON:  05/16/2014, 10/12/2012 CXR FINDINGS: The heart size and mediastinal contours are within normal limits. Aortic atherosclerosis without aneurysm. Both lungs are clear. 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 IMPRESSION: No active cardiopulmonary disease. Electronically Signed   By: Ashley Royalty M.D.   On: 10/16/2016 17:51     Steve Rattler, DO 10/16/2016, 11:58 PM PGY-1, [place] Intern pager: [phone], text pages welcome

## 2016-10-16 NOTE — ED Triage Notes (Addendum)
Pt endorses cough with clear sputum x 2 days, pt has asthma and has been without rescue inhaler x 2 weeks. Wheezing and rhonchi in all fields. Pt afebrile in triage. Pt's breathing labored but able to speak in complete sentences.

## 2016-10-17 ENCOUNTER — Encounter (HOSPITAL_COMMUNITY): Payer: Self-pay

## 2016-10-17 DIAGNOSIS — J441 Chronic obstructive pulmonary disease with (acute) exacerbation: Principal | ICD-10-CM

## 2016-10-17 LAB — BASIC METABOLIC PANEL
Anion gap: 16 — ABNORMAL HIGH (ref 5–15)
BUN: 6 mg/dL (ref 6–20)
CHLORIDE: 98 mmol/L — AB (ref 101–111)
CO2: 23 mmol/L (ref 22–32)
CREATININE: 0.82 mg/dL (ref 0.44–1.00)
Calcium: 9.1 mg/dL (ref 8.9–10.3)
GFR calc non Af Amer: >60 mL/min (ref >60)
Glucose, Bld: 213 mg/dL — ABNORMAL HIGH (ref 65–99)
POTASSIUM: 2.9 mmol/L — AB (ref 3.5–5.1)
SODIUM: 137 mmol/L (ref 135–145)

## 2016-10-17 LAB — TROPONIN I: Troponin I: <0.03 ng/mL (ref <0.03)

## 2016-10-17 LAB — CBC
HEMATOCRIT: 33.7 % — AB (ref 36.0–46.0)
Hemoglobin: 10.9 g/dL — ABNORMAL LOW (ref 12.0–15.0)
MCH: 28.8 pg (ref 26.0–34.0)
MCHC: 32.3 g/dL (ref 30.0–36.0)
MCV: 89.2 fL (ref 78.0–100.0)
PLATELETS: 318 10*3/uL (ref 150–400)
RBC: 3.78 MIL/uL — AB (ref 3.87–5.11)
RDW: 13.9 % (ref 11.5–15.5)
WBC: 6.8 10*3/uL (ref 4.0–10.5)

## 2016-10-17 LAB — INFLUENZA PANEL BY PCR (TYPE A & B)
Influenza A By PCR: NEGATIVE
Influenza B By PCR: NEGATIVE

## 2016-10-17 LAB — MAGNESIUM: Magnesium: 2.7 mg/dL — ABNORMAL HIGH (ref 1.7–2.4)

## 2016-10-17 MED ORDER — PROMETHAZINE HCL 25 MG PO TABS: 25.0000 mg | ORAL_TABLET | Freq: Three times a day (TID) | ORAL | Status: DC | PRN

## 2016-10-17 MED ORDER — SODIUM CHLORIDE 0.9% FLUSH
3.0000 mL | Freq: Two times a day (BID) | INTRAVENOUS | Status: DC
  Administered 2016-10-17 – 2016-10-22 (×9): 3 mL via INTRAVENOUS

## 2016-10-17 MED ORDER — SODIUM CHLORIDE 0.9 % IV SOLN
INTRAVENOUS | Status: DC
  Administered 2016-10-17 – 2016-10-22 (×10): via INTRAVENOUS

## 2016-10-17 MED ORDER — CLOPIDOGREL BISULFATE 75 MG PO TABS
75.0000 mg | ORAL_TABLET | Freq: Every day | ORAL | Status: DC
  Administered 2016-10-17 – 2016-10-22 (×6): 75 mg via ORAL
  Filled 2016-10-17 (×6): qty 1

## 2016-10-17 MED ORDER — ASPIRIN EC 81 MG PO TBEC
81.0000 mg | DELAYED_RELEASE_TABLET | Freq: Every day | ORAL | Status: DC
  Administered 2016-10-17 – 2016-10-22 (×6): 81 mg via ORAL
  Filled 2016-10-17 (×6): qty 1

## 2016-10-17 MED ORDER — IPRATROPIUM-ALBUTEROL 0.5-2.5 (3) MG/3ML IN SOLN
3.0000 mL | RESPIRATORY_TRACT | Status: DC
  Administered 2016-10-17 – 2016-10-19 (×16): 3 mL via RESPIRATORY_TRACT
  Filled 2016-10-17 (×16): qty 3

## 2016-10-17 MED ORDER — POLYETHYLENE GLYCOL 3350 17 G PO PACK: 17.0000 g | PACK | Freq: Every day | ORAL | Status: DC | PRN

## 2016-10-17 MED ORDER — AZITHROMYCIN 500 MG PO TABS
500.0000 mg | ORAL_TABLET | Freq: Every day | ORAL | Status: AC
  Administered 2016-10-17: 500 mg via ORAL
  Filled 2016-10-17: qty 1

## 2016-10-17 MED ORDER — AZITHROMYCIN 250 MG PO TABS
250.0000 mg | ORAL_TABLET | Freq: Every day | ORAL | Status: AC
  Administered 2016-10-18 – 2016-10-21 (×4): 250 mg via ORAL
  Filled 2016-10-17 (×4): qty 1

## 2016-10-17 MED ORDER — POTASSIUM CHLORIDE CRYS ER 20 MEQ PO TBCR
40.0000 meq | EXTENDED_RELEASE_TABLET | Freq: Once | ORAL | Status: AC
  Administered 2016-10-17: 40 meq via ORAL
  Filled 2016-10-17: qty 2

## 2016-10-17 MED ORDER — ENOXAPARIN SODIUM 40 MG/0.4ML ~~LOC~~ SOLN
40.0000 mg | SUBCUTANEOUS | Status: DC
  Administered 2016-10-18 – 2016-10-22 (×5): 40 mg via SUBCUTANEOUS
  Filled 2016-10-17 (×6): qty 0.4

## 2016-10-17 MED ORDER — ENSURE ENLIVE PO LIQD
237.0000 mL | Freq: Two times a day (BID) | ORAL | Status: DC
  Administered 2016-10-17 – 2016-10-21 (×2): 237 mL via ORAL

## 2016-10-17 MED ORDER — TRAMADOL HCL 50 MG PO TABS
50.0000 mg | ORAL_TABLET | Freq: Four times a day (QID) | ORAL | Status: DC | PRN
  Administered 2016-10-17 – 2016-10-18 (×4): 50 mg via ORAL
  Filled 2016-10-17 (×4): qty 1

## 2016-10-17 MED ORDER — TIOTROPIUM BROMIDE MONOHYDRATE 18 MCG IN CAPS
18.0000 ug | ORAL_CAPSULE | Freq: Every day | RESPIRATORY_TRACT | Status: DC
  Administered 2016-10-18 – 2016-10-22 (×4): 18 ug via RESPIRATORY_TRACT
  Filled 2016-10-17 (×3): qty 5

## 2016-10-17 MED ORDER — BENZONATATE 100 MG PO CAPS
100.0000 mg | ORAL_CAPSULE | Freq: Three times a day (TID) | ORAL | Status: DC | PRN
  Administered 2016-10-17 – 2016-10-21 (×5): 100 mg via ORAL
  Filled 2016-10-17 (×5): qty 1

## 2016-10-17 MED ORDER — GUAIFENESIN ER 600 MG PO TB12
600.0000 mg | ORAL_TABLET | Freq: Two times a day (BID) | ORAL | Status: DC | PRN
  Administered 2016-10-17 – 2016-10-21 (×3): 600 mg via ORAL
  Filled 2016-10-17 (×3): qty 1

## 2016-10-17 MED ORDER — PREDNISONE 20 MG PO TABS
50.0000 mg | ORAL_TABLET | Freq: Every day | ORAL | Status: DC
  Administered 2016-10-17 – 2016-10-18 (×2): 50 mg via ORAL
  Filled 2016-10-17 (×2): qty 2

## 2016-10-17 MED ORDER — POTASSIUM CHLORIDE CRYS ER 20 MEQ PO TBCR
40.0000 meq | EXTENDED_RELEASE_TABLET | Freq: Two times a day (BID) | ORAL | Status: AC
  Administered 2016-10-17 (×2): 40 meq via ORAL
  Filled 2016-10-17: qty 2

## 2016-10-17 MED ORDER — ALBUTEROL SULFATE (2.5 MG/3ML) 0.083% IN NEBU
2.5000 mg | INHALATION_SOLUTION | RESPIRATORY_TRACT | Status: DC | PRN
  Administered 2016-10-18 (×2): 2.5 mg via RESPIRATORY_TRACT
  Filled 2016-10-17 (×2): qty 3

## 2016-10-17 MED ORDER — ACETAMINOPHEN 325 MG PO TABS
650.0000 mg | ORAL_TABLET | Freq: Four times a day (QID) | ORAL | Status: DC | PRN
  Administered 2016-10-18 – 2016-10-21 (×6): 650 mg via ORAL
  Filled 2016-10-17 (×6): qty 2

## 2016-10-17 MED ORDER — ATORVASTATIN CALCIUM 80 MG PO TABS
80.0000 mg | ORAL_TABLET | Freq: Every day | ORAL | Status: DC
  Administered 2016-10-17 – 2016-10-21 (×5): 80 mg via ORAL
  Filled 2016-10-17 (×5): qty 1

## 2016-10-17 MED ORDER — ACETAMINOPHEN 650 MG RE SUPP: 650.0000 mg | Freq: Four times a day (QID) | RECTAL | Status: DC | PRN

## 2016-10-17 NOTE — Progress Notes (Signed)
Transitions of Care Pharmacy Note  Plan:  - Educated on new Spiriva inhaler and demonstrated use.  - Educated on length of therapy, indication and side effects of prednisone and azithromycin  - Educated on plavix, aspirin, and atorvastatin.   --------------------------------------------- [name] is an 66 y.o. female who presents with a chief complaint of shortness of breath. In anticipation of discharge, pharmacy has reviewed this patient's prior to admission medication history, as well as current inpatient medications listed per the [facility].  Current medication indications, dosing, frequency, and notable side effects reviewed with patient. patient verbalized understanding of current inpatient medication regimen and is aware that the After Visit Summary when presented, will represent the most accurate medication list at discharge.   [name] expressed concerns regarding her new medications after her femoral artery stenting last week. I reviewed the indications, dosing, and side effects of her aspirin, plavix, and atorvastatin. She stated that her Medicaid and [employer] covered all of her medications well and that her pharmacy delivers her prescriptions.  Assessment: Understanding of regimen: fair Understanding of indications: fair Potential of compliance: good Barriers to Obtaining Medications: No  Patient instructed to contact inpatient pharmacy team with further questions or concerns if needed.    Time spent preparing for discharge counseling: 15 minutes Time spent counseling patient: 15 minutes   Thank you for allowing pharmacy to be a part of this patient's care.  Ihor Austin, PharmD PGY1 Pharmacy Resident Pager: [phone]

## 2016-10-17 NOTE — Care Management Note (Addendum)
Case Management Note Marvetta Gibbons RN, BSN Unit 2W-Case Manager [phone]  Patient Details  Name: [name] MRN: [id number] Date of Birth: 12-26-1950  Subjective/Objective:  Pt presented with [place] exacerbation                  Action/Plan: PTA pt lived at home, does not wear home 02, independent, referral for COPD controllers- noted that pt had been started on Spiriva- insurance check completed- copay cost $3.70- spoke with pt at bedside coverage info shared- per pt she does not have any difficulty affording meds- also had Medicaid coverage. Has a RW at home already   Expected Discharge Date:    10/18/16              Expected Discharge Plan:  Home/Self Care  In-House Referral:     Discharge planning Services  CM Consult, Medication Assistance  Post Acute Care Choice:    Choice offered to:     DME Arranged:    DME Agency:     HH Arranged:    HH Agency:     Status of Service:  Completed, signed off  If discussed at [employer], dates discussed:    Additional Comments:  Dawayne Patricia, RN 10/17/2016, 2:53 PM

## 2016-10-17 NOTE — Progress Notes (Signed)
Insurance check for [name] @ [employer] # [id number]. SPIRIVA  18 MCG DAILY INHALER   COVER- YES  CO-PAY- $ 3.70  TIER- 3 DRUG  PRIOR APPROVAL- NO   PHARMACY ; [facility]  MAIL-ORDER FOR 90 DAY SUPPLY # 3.70 [employer]

## 2016-10-17 NOTE — Progress Notes (Signed)
Nutrition Brief Note  Patient identified on the Malnutrition Screening Tool (MST) Report.  Wt Readings from Last 15 Encounters:  10/17/16 157 lb 6.4 oz (71.4 kg)  10/06/16 160 lb (72.6 kg)  09/26/16 168 lb (76.2 kg)  05/26/16 163 lb (73.9 kg)  04/12/16 158 lb 6.4 oz (71.9 kg)  04/11/16 158 lb 6.4 oz (71.8 kg)  04/02/16 161 lb 12.8 oz (73.4 kg)  09/28/15 170 lb (77.1 kg)  12/14/14 168 lb (76.2 kg)  11/09/14 166 lb (75.3 kg)  05/16/14 162 lb (73.5 kg)  04/27/14 156 lb (70.8 kg)  08/15/13 134 lb (60.8 kg)  08/10/13 133 lb (60.3 kg)  07/20/13 132 lb (59.9 kg)   Body mass index is 25.41 kg/m. Patient meets criteria for Overweight based on current BMI.   Current diet order is Regular.  Labs and medications reviewed.   No nutrition interventions warranted at this time. If nutrition issues arise, please consult RD.   Arthur Holms, RD, LDN Pager #: [phone] After-Hours Pager #: [phone]

## 2016-10-17 NOTE — Care Management Obs Status (Signed)
[facility] NOTIFICATION   Patient Details  Name: [name] MRN: [id number] Date of Birth: Dec 17, 1950   Medicare Observation Status Notification Given:  Yes    Dawayne Patricia, RN 10/17/2016, 2:53 PM

## 2016-10-17 NOTE — Progress Notes (Signed)
Inpatient Diabetes Program Recommendations  AACE/ADA: New Consensus Statement on Inpatient Glycemic Control (2015)  Target Ranges:  Prepandial:   less than 140 mg/dL      Peak postprandial:   less than 180 mg/dL (1-2 hours)      Critically ill patients:  140 - 180 mg/dL   Results for LOLISA, MUSCH (MRN [id number]) as of 10/17/2016 11:09  Ref. Range 10/16/2016 17:33 10/17/2016 01:51  Glucose Latest Ref Range: 65 - 99 mg/dL 124 (H) 213 (H)   Review of Glycemic Control  Diabetes history: None Current orders for Inpatient glycemic control: None  Inpatient Diabetes Program Recommendations:  Patient admitted with COPD Asthma exacerbation received 125 mg IV Solumedrol now on PO prednisone 50 mg Daily. Lab glucose this am was 213 mg/dl.   Please consider checking CBG and if elevated still consider CBGs and Novolog Sensitive correction TID.  Thanks,  Tama Headings RN, MSN, [facility] Inpatient Diabetes Coordinator Team Pager [phone] (8a-5p)

## 2016-10-17 NOTE — Discharge Summary (Signed)
[facility] Discharge Summary  Patient name: [name] Medical record number: [id number] Date of birth: 06/06/1951 Age: 66 y.o. Gender: female Date of Admission: 10/16/2016  Date of Discharge:  Admitting Physician: Lind Covert, MD  Primary Care Provider: Smiley Houseman, MD Consultants: None  Indication for Hospitalization: COPD exacerbation   Discharge Diagnoses/Problem List:  COPD PVD with recent femoral artery stenting Chronic nausea  Disposition: discharge home  Discharge Condition: stable, improved  Discharge Exam:  General: older woman, sitting up in bed, appears tired ENTM: dry MMs Cardiovascular: RRR, no MRG Respiratory: Diffuse inspiratory and expiratory wheezes, speaking in complete sentences.  Gastrointestinal: soft, NTND, +BS MSK: no edema or cyanosis Derm: warm, dry Neuro: AOx3 Psych: anxious mood and affect  Brief Hospital Course:  Patient presented to ED with SOB and required CAT, duonebs, mag and solumedrol and had new oxygen requirement.  CXR normal and flu negative.  Received scheduled albuterol and duonebs initially and azithromycin x5 days as well as prednisone x5 days.  Seen by ENT who that the predominant upper airway transmission was due to tracheal and   She was started on Spiriva during this admission.  At the time of discharge was comfortable on room air and feeling improved.    Issues for Follow Up:  1. COPD/Asthma: Large component of airway symptoms felt to be due to tracheal and bronchial irritation/inflammation due to reflux.  Recommending starting daily spiriva and daily protonix and PRN carafate and tums. Cannot rule out lagyngeal pathology.  If symptoms continue, consider ENT consult for laryngoscopy   Significant Procedures: none  Significant Labs and Imaging:   Recent Labs Lab 10/19/16 0308 10/20/16 0223 10/22/16 0203  WBC 8.8 7.7 8.3  HGB 9.2* 9.0* 9.1*  HCT 29.3* 29.2* 29.3*  PLT 321  374 402*    Recent Labs Lab 10/16/16 1733 10/17/16 0151 10/17/16 0932 10/18/16 0419 10/19/16 0308 10/20/16 0223 10/22/16 0203  NA 136 137  --  141 140 143 140  K 3.2* 2.9*  --  4.0 4.8 4.3 4.2  CL 96* 98*  --  107 108 107 103  CO2 27 23  --  26 24 29 28   GLUCOSE 124* 213*  --  129* 103* 115* 103*  BUN <5* 6  --  11 8 10 11   CREATININE 0.78 0.82  --  0.80 0.77 0.78 0.81  CALCIUM 9.2 9.1  --  8.5* 8.3* 8.5* 8.4*  MG  --   --  2.7*  --   --   --   --   ALKPHOS 96  --   --   --   --   --   --   AST 24  --   --   --   --   --   --   ALT 10*  --   --   --   --   --   --   ALBUMIN 3.9  --   --   --   --   --   --      Results/Tests Pending at Time of Discharge: none  Discharge Medications:  Allergies as of 10/22/2016   No Known Allergies     Medication List    STOP taking these medications   clobetasol cream 0.05 % Commonly known as:  TEMOVATE   VENTOLIN HFA 108 (90 Base) MCG/ACT inhaler Generic drug:  albuterol     TAKE these medications   atorvastatin 80 MG tablet Commonly  known as:  LIPITOR take 1 tablet by mouth once daily AT 6 PM   clopidogrel 75 MG tablet Commonly known as:  PLAVIX Take 1 tablet (75 mg total) by mouth daily.   guaiFENesin 600 MG 12 hr tablet Commonly known as:  MUCINEX Take 600 mg by mouth 2 (two) times daily as needed for cough or to loosen phlegm.   ipratropium 17 MCG/ACT inhaler Commonly known as:  ATROVENT HFA Inhale 2 puffs into the lungs every 6 (six) hours as needed for wheezing.   pantoprazole 20 MG tablet Commonly known as:  PROTONIX Take 1 tablet (20 mg total) by mouth daily.   promethazine 25 MG tablet Commonly known as:  PHENERGAN take 1 tablet by mouth every 6 hours if needed for nausea and vomiting   RA ASPIRIN EC ADULT LOW ST 81 MG EC tablet Generic drug:  aspirin take 1 tablet by mouth once daily   sucralfate 1 GM/10ML suspension Commonly known as:  CARAFATE Take 10 mLs (1 g total) by mouth 4 (four) times daily  -  with meals and at bedtime.   tiotropium 18 MCG inhalation capsule Commonly known as:  SPIRIVA Place 1 capsule (18 mcg total) into inhaler and inhale daily. Start taking on:  10/23/2016   traMADol 50 MG tablet Commonly known as:  ULTRAM Take 1 tablet (50 mg total) by mouth every 6 (six) hours as needed.       Discharge Instructions: Please refer to Patient Instructions section of EMR for full details.  Patient was counseled important signs and symptoms that should prompt return to medical care, changes in medications, dietary instructions, activity restrictions, and follow up appointments.   Follow-Up Appointments: Please follow up on 10/30/16 at 2PM at the family medicine center.    Eloise Levels, MD 10/22/2016, 2:21 PM PGY-1, [place]

## 2016-10-17 NOTE — Progress Notes (Signed)
Family Medicine Teaching Service Daily Progress Note Intern Pager: [phone]  Patient name: [name] Medical record number: [id number] Date of birth: 08-18-1951 Age: 66 y.o. Gender: female  Primary Care Provider: Smiley Houseman, MD Consultants: none Code Status: Full  Pt Overview and Major Events to Date:  1. Admitted to FPTS  Assessment and Plan: Darothy Fuller is a 66 y.o. female presenting with dyspnea. PMH is significant for COPD, Asthma, GERD, hx of tobacco abuse, PVD with recent stent in femoral artery  COPD/Asthma overlap syndrome with acute exacerbation- URI last week and was using albuterol q1hr PTA to ED and ran out.  S/p CAT, duonebs, solumedrol and magnesium in ED. CXR clear for acute infiltrate.  WBC 6.1, lactic acid 1.14>1.43. Afebrile.  No home O2 requirement. On 2L satting 98% 2LNC.  No events on telemetry and ekg showed mild sinus tachy, otherwise normal. Troponin normal x1.  Negative flu.   - consider repeat CXR if resp status does not improve to r/o developing PNA -vitals per floor -oxygen ordered goal to keep sats >88% -duonebs q4 hours schedule -albuterol nebulizers q2H as needed -patient will likely need controller medication at discharge - azithromycin (day 1/5) - prednisone day 2/5  Hypokalemia- potassium on admission 3.2. Down to 2.9 this AM s/p 40kdur.  -replete with 50meq k-dur twice -check BMP in morning  PVD with recent femoral artery stenting- stent placed last week. 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  Denies CP, changes in vision, NV or  diarrhea.  Not drinking much.  Had about one styrofoam cup worth of volume.   Objective: Temp:  [97.8 F (36.6 C)-98.4 F (36.9 C)] 98.3 F (36.8 C) (02/16 0147) Pulse Rate:  [92-119] 110 (02/16 0147) Resp:  [12-24] 12 (02/16 0147) BP: (139-173)/(78-147) 164/87 (02/16 0147) SpO2:  [94 %-100 %] 98 % (02/16 0446) Weight:  [157 lb 6.4 oz (71.4 kg)-160 lb (72.6 kg)] 157 lb 6.4 oz (71.4 kg) (02/16 0147) Physical Exam: General: elderly woman sitting up in bed appearing comfortable Eyes: PERRL, EOMI ENTM: moist mucous membranes Neck: supple,non-tender, no lymphadenopathy Cardiovascular: RRR no MRG Respiratory: Upper airway sounds transmitted. diffuse wheezing, good air movement and no increased WOB Gastrointestinal: soft, NTND  +BS MSK: no edema or cyanosis Derm: warm, dry Neuro: AAOx3 Psych: appropriate mood and affect  Laboratory:  Recent Labs Lab 10/16/16 1733 10/17/16 0151  WBC 6.1 6.8  HGB 11.9* 10.9*  HCT 36.6 33.7*  PLT 340 318    Recent Labs Lab 10/16/16 1733 10/17/16 0151  NA 136 137  K 3.2* 2.9*  CL 96* 98*  CO2 27 23  BUN <5* 6  CREATININE 0.78 0.82  CALCIUM 9.2 9.1  PROT 8.0  --   BILITOT 0.5  --   ALKPHOS 96  --   ALT 10*  --   AST 24  --   GLUCOSE 124* 213*    Imaging/Diagnostic Tests: Dg Chest 2 View  Result Date: 10/16/2016 CLINICAL DATA:  Chest pain, dyspnea, fever and productive cough EXAM: CHEST  2 VIEW COMPARISON:  05/16/2014, 10/12/2012 CXR FINDINGS: The heart size and mediastinal contours are within normal limits. Aortic  atherosclerosis without aneurysm. Both lungs are clear. 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 IMPRESSION: No active cardiopulmonary disease. Electronically Signed   By: Ashley Royalty M.D.   On: 10/16/2016 17:51    Eloise Levels, MD 10/17/2016, 7:28 AM PGY-1, [place] Intern pager: [phone], text pages welcome

## 2016-10-18 ENCOUNTER — Observation Stay (HOSPITAL_COMMUNITY): Payer: Medicare HMO

## 2016-10-18 DIAGNOSIS — J45901 Unspecified asthma with (acute) exacerbation: Secondary | ICD-10-CM

## 2016-10-18 DIAGNOSIS — F17210 Nicotine dependence, cigarettes, uncomplicated: Secondary | ICD-10-CM | POA: Diagnosis present

## 2016-10-18 DIAGNOSIS — R0603 Acute respiratory distress: Secondary | ICD-10-CM | POA: Diagnosis not present

## 2016-10-18 DIAGNOSIS — Z79899 Other long term (current) drug therapy: Secondary | ICD-10-CM | POA: Diagnosis not present

## 2016-10-18 DIAGNOSIS — K219 Gastro-esophageal reflux disease without esophagitis: Secondary | ICD-10-CM | POA: Diagnosis present

## 2016-10-18 DIAGNOSIS — R110 Nausea: Secondary | ICD-10-CM | POA: Diagnosis present

## 2016-10-18 DIAGNOSIS — Z809 Family history of malignant neoplasm, unspecified: Secondary | ICD-10-CM | POA: Diagnosis not present

## 2016-10-18 DIAGNOSIS — I70209 Unspecified atherosclerosis of native arteries of extremities, unspecified extremity: Secondary | ICD-10-CM | POA: Diagnosis present

## 2016-10-18 DIAGNOSIS — E876 Hypokalemia: Secondary | ICD-10-CM | POA: Diagnosis present

## 2016-10-18 DIAGNOSIS — R05 Cough: Secondary | ICD-10-CM | POA: Diagnosis not present

## 2016-10-18 DIAGNOSIS — J441 Chronic obstructive pulmonary disease with (acute) exacerbation: Secondary | ICD-10-CM | POA: Diagnosis present

## 2016-10-18 DIAGNOSIS — R0602 Shortness of breath: Secondary | ICD-10-CM | POA: Diagnosis not present

## 2016-10-18 DIAGNOSIS — Z9049 Acquired absence of other specified parts of digestive tract: Secondary | ICD-10-CM | POA: Diagnosis not present

## 2016-10-18 DIAGNOSIS — Z7902 Long term (current) use of antithrombotics/antiplatelets: Secondary | ICD-10-CM | POA: Diagnosis not present

## 2016-10-18 DIAGNOSIS — Z8249 Family history of ischemic heart disease and other diseases of the circulatory system: Secondary | ICD-10-CM | POA: Diagnosis not present

## 2016-10-18 DIAGNOSIS — Z90711 Acquired absence of uterus with remaining cervical stump: Secondary | ICD-10-CM | POA: Diagnosis not present

## 2016-10-18 DIAGNOSIS — Z86010 Personal history of colonic polyps: Secondary | ICD-10-CM | POA: Diagnosis not present

## 2016-10-18 DIAGNOSIS — Z90710 Acquired absence of both cervix and uterus: Secondary | ICD-10-CM | POA: Diagnosis not present

## 2016-10-18 LAB — BASIC METABOLIC PANEL
ANION GAP: 8 (ref 5–15)
BUN: 11 mg/dL (ref 6–20)
CALCIUM: 8.5 mg/dL — AB (ref 8.9–10.3)
CO2: 26 mmol/L (ref 22–32)
Chloride: 107 mmol/L (ref 101–111)
Creatinine, Ser: 0.8 mg/dL (ref 0.44–1.00)
GFR calc non Af Amer: >60 mL/min (ref >60)
Glucose, Bld: 129 mg/dL — ABNORMAL HIGH (ref 65–99)
POTASSIUM: 4 mmol/L (ref 3.5–5.1)
SODIUM: 141 mmol/L (ref 135–145)

## 2016-10-18 MED ORDER — METHYLPREDNISOLONE SODIUM SUCC 125 MG IJ SOLR
80.0000 mg | Freq: Every day | INTRAMUSCULAR | Status: DC
  Administered 2016-10-19 – 2016-10-22 (×4): 80 mg via INTRAVENOUS
  Filled 2016-10-18 (×4): qty 2

## 2016-10-18 MED ORDER — LEVALBUTEROL HCL 0.63 MG/3ML IN NEBU
0.6300 mg | INHALATION_SOLUTION | RESPIRATORY_TRACT | Status: DC | PRN
  Administered 2016-10-19 – 2016-10-21 (×2): 0.63 mg via RESPIRATORY_TRACT
  Filled 2016-10-18 (×2): qty 3

## 2016-10-18 NOTE — Progress Notes (Signed)
Family Medicine Teaching Service Daily Progress Note Intern Pager: [phone]  Patient name: [name] Medical record number: [id number] Date of birth: [birth year] Age: 66 y.o. Gender: female  Primary Care Provider: Smiley Houseman, MD Consultants: none Code Status: Full  Pt Overview and Major Events to Date:  1. Admitted to [place] for dyspnea  Assessment and Plan: [name] is a 66 y.o. female presenting with dyspnea. PMH is significant for COPD, Asthma, GERD, hx of tobacco abuse, PVD with recent stent in femoral artery  COPD/Asthma overlap syndrome with acute exacerbation- Stable/Not improved. In setting of URI last week and was using albuterol q1hr PTA to ED and ran out. S/p CAT, duonebs, solumedrol and magnesium in ED. Initial CXR clear for acute infiltrate. Troponin normal x 3. Negative flu.   - Repeat CXR to r/o developing PNA - vitals per floor - oxygen ordered goal to keep sats >88%; wean as able - duonebs q4 hours scheduled - spiriva added 2/16; will need at discharge - Could consider another CAT treatment, though persistently tachycardic - Will switch q2h albuterol prn to xopenex due to tachycardia - azithromycin (day 2/5) - prednisone day 3/5 - will need ambulation with O2 monitoring prior to d/c  Hypokalemia- Resolved. 3.2 on admission. -check BMP daily  PVD with recent femoral artery stenting- stent placed last week. 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 Felt she could not catch her breath the entire night. Says she became short of breath when walking to bathroom this a.m., requiring  additional breathing treatment.   Objective: Temp:  [98.2 F (36.8 C)-98.9 F (37.2 C)] 98.6 F (37 C) (02/17 0448) Pulse Rate:  [98-107] 102 (02/17 0448) Resp:  [19-20] 20 (02/17 0448) BP: (128-142)/(65-81) 128/71 (02/17 0448) SpO2:  [98 %-100 %] 99 % (02/17 0904) Physical Exam: General: older woman, sitting up in bed, appears tired ENTM: dry MMs Cardiovascular: RRR, no MRG Respiratory: Diffuse inspiratory and expiratory wheezes, speaking in complete sentences Gastrointestinal: soft, NTND, +BS MSK: no edema or cyanosis Derm: warm, dry Neuro: AOx3 Psych: anxious mood and affect  Laboratory:  Recent Labs Lab 10/16/16 1733 10/17/16 0151  WBC 6.1 6.8  HGB 11.9* 10.9*  HCT 36.6 33.7*  PLT 340 318    Recent Labs Lab 10/16/16 1733 10/17/16 0151 10/18/16 0419  NA 136 137 141  K 3.2* 2.9* 4.0  CL 96* 98* 107  CO2 27 23 26   BUN <5* 6 11  CREATININE 0.78 0.82 0.80  CALCIUM 9.2 9.1 8.5*  PROT 8.0  --   --   BILITOT 0.5  --   --   ALKPHOS 96  --   --   ALT 10*  --   --   AST 24  --   --   GLUCOSE 124* 213* 129*    Imaging/Diagnostic Tests: No results found.  Rogue Bussing, MD 10/18/2016, 9:34 AM PGY-2, [place] Intern pager: [phone], text pages welcome

## 2016-10-19 ENCOUNTER — Inpatient Hospital Stay (HOSPITAL_COMMUNITY): Payer: Medicare HMO

## 2016-10-19 LAB — CBC
HCT: 29.3 % — ABNORMAL LOW (ref 36.0–46.0)
Hemoglobin: 9.2 g/dL — ABNORMAL LOW (ref 12.0–15.0)
MCH: 29 pg (ref 26.0–34.0)
MCHC: 31.4 g/dL (ref 30.0–36.0)
MCV: 92.4 fL (ref 78.0–100.0)
Platelets: 321 10*3/uL (ref 150–400)
RBC: 3.17 MIL/uL — ABNORMAL LOW (ref 3.87–5.11)
RDW: 14.8 % (ref 11.5–15.5)
WBC: 8.8 10*3/uL (ref 4.0–10.5)

## 2016-10-19 LAB — BASIC METABOLIC PANEL
Anion gap: 8 (ref 5–15)
BUN: 8 mg/dL (ref 6–20)
CALCIUM: 8.3 mg/dL — AB (ref 8.9–10.3)
CO2: 24 mmol/L (ref 22–32)
CREATININE: 0.77 mg/dL (ref 0.44–1.00)
Chloride: 108 mmol/L (ref 101–111)
Glucose, Bld: 103 mg/dL — ABNORMAL HIGH (ref 65–99)
Potassium: 4.8 mmol/L (ref 3.5–5.1)
SODIUM: 140 mmol/L (ref 135–145)

## 2016-10-19 LAB — D-DIMER, QUANTITATIVE (NOT AT ARMC): D DIMER QUANT: 0.46 ug{FEU}/mL (ref 0.00–0.50)

## 2016-10-19 LAB — TROPONIN I

## 2016-10-19 MED ORDER — IPRATROPIUM-ALBUTEROL 0.5-2.5 (3) MG/3ML IN SOLN
3.0000 mL | Freq: Four times a day (QID) | RESPIRATORY_TRACT | Status: DC
  Administered 2016-10-20: 3 mL via RESPIRATORY_TRACT
  Filled 2016-10-19: qty 3

## 2016-10-19 MED ORDER — ALBUTEROL (5 MG/ML) CONTINUOUS INHALATION SOLN: 10.0000 mg/h | INHALATION_SOLUTION | RESPIRATORY_TRACT | Status: DC

## 2016-10-19 MED ORDER — IPRATROPIUM BROMIDE 0.02 % IN SOLN
0.5000 mg | Freq: Once | RESPIRATORY_TRACT | Status: AC
  Administered 2016-10-19: 0.5 mg via RESPIRATORY_TRACT

## 2016-10-19 MED ORDER — ALBUTEROL (5 MG/ML) CONTINUOUS INHALATION SOLN
10.0000 mg/h | INHALATION_SOLUTION | RESPIRATORY_TRACT | Status: DC
  Filled 2016-10-19: qty 20

## 2016-10-19 MED ORDER — ALBUTEROL SULFATE (2.5 MG/3ML) 0.083% IN NEBU
INHALATION_SOLUTION | RESPIRATORY_TRACT | Status: AC
  Administered 2016-10-19: 10 mg
  Filled 2016-10-19: qty 12

## 2016-10-19 MED ORDER — IPRATROPIUM BROMIDE 0.02 % IN SOLN
RESPIRATORY_TRACT | Status: AC
  Filled 2016-10-19: qty 2.5

## 2016-10-19 NOTE — Progress Notes (Signed)
Family Medicine Teaching Service Daily Progress Note Intern Pager: [phone]  Patient name: [name] Medical record number: [id number] Date of birth: [birth year] Age: 66 y.o. Gender: female  Primary Care Provider: Smiley Houseman, MD Consultants: none Code Status: Full  Pt Overview and Major Events to Date:  1. Admitted to [facility] for dyspnea  Assessment and Plan: [name] is a 66 y.o. female presenting with dyspnea. PMH is significant for COPD, Asthma, GERD, hx of tobacco abuse, PVD with recent stent in femoral artery  COPD/Asthma overlap syndrome with acute exacerbation- Stable/Not improved. Repeat CXR WNL. Satting 100%2L and audibly wheezing.  - vitals per floor - oxygen ordered goal to keep sats >88%; wean as able - duonebs q4 hours scheduled - spiriva added 2/16; will need at discharge - q2h PRN xopenex due to tachycardia - azithromycin (day 3/5) - prednisone day 4/5 - will need ambulation with O2 monitoring prior to d/c  Hypokalemia- Resolved. 3.2 on admission. -check BMP daily  PVD with recent femoral artery stenting- stent placed last week. 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 Lying down in LL decubitus position with audible upper airway sounds. Denies CP or palpitations, but does feel SOB.   Objective: Temp:  [97.7 F (36.5 C)-98.1 F (36.7 C)] 98.1 F (36.7 C) (02/18 0629) Pulse Rate:  [93-99] 93 (02/18 0629) Resp:  [18] 18 (02/18 0629) BP: (131-170)/(67-91) 161/86 (02/18 0629) SpO2:  [99 %-100 %] 100 % (02/18 0629) Physical Exam: General: older woman, sitting up in bed,  appears tired ENTM: dry MMs Cardiovascular: RRR, no MRG Respiratory: Diffuse inspiratory and expiratory wheezes, speaking in complete sentences.  Gastrointestinal: soft, NTND, +BS MSK: no edema or cyanosis Derm: warm, dry Neuro: AOx3 Psych: anxious mood and affect  Laboratory:  Recent Labs Lab 10/16/16 1733 10/17/16 0151 10/19/16 0308  WBC 6.1 6.8 8.8  HGB 11.9* 10.9* 9.2*  HCT 36.6 33.7* 29.3*  PLT 340 318 321    Recent Labs Lab 10/16/16 1733 10/17/16 0151 10/18/16 0419 10/19/16 0308  NA 136 137 141 140  K 3.2* 2.9* 4.0 4.8  CL 96* 98* 107 108  CO2 27 23 26 24   BUN <5* 6 11 8   CREATININE 0.78 0.82 0.80 0.77  CALCIUM 9.2 9.1 8.5* 8.3*  PROT 8.0  --   --   --   BILITOT 0.5  --   --   --   ALKPHOS 96  --   --   --   ALT 10*  --   --   --   AST 24  --   --   --   GLUCOSE 124* 213* 129* 103*    Imaging/Diagnostic Tests: Dg Chest 2 View  Result Date: 10/18/2016 CLINICAL DATA:  Shortness of breath and fever.  Cough EXAM: CHEST  2 VIEW COMPARISON:  October 16, 2016 FINDINGS: There is no edema or consolidation. Heart is upper normal in size with pulmonary vascularity within normal limits. No adenopathy. There is atherosclerotic calcification in the aorta. No bone lesions. IMPRESSION: No edema or consolidation.  There is aortic atherosclerosis. Electronically Signed   By: Lowella Grip III M.D.   On: 10/18/2016 13:35  Eloise Levels, MD 10/19/2016, 6:49 AM PGY-1, [place] Intern pager: [phone], text pages welcome

## 2016-10-19 NOTE — Progress Notes (Signed)
  Interim Progress Note  Went to assess Ms. Orsak after several hours of CAT. Patient feels much improved. Saturating 100% on 2.5L O2. Wheezes throughout. Mild increased work of breathing. She was able to use the bedside commode and is happy. No other complaints. Will continue to follow, continue scheduled breathing treatments.  Lucila Maine, DO PGY-1, [facility] 10/19/2016 11:00 PM   Text pages welcome [phone]

## 2016-10-19 NOTE — Progress Notes (Addendum)
FPTS Interim Progress Note  S: Paged by RT due to concern for increased WOB and no response to duoneb treatment. Patient reports that for the past hour she has been having greater difficulty breathing and has acutely worsening chest tightness that is not new, but has acutely worsened.   O: BP 138/79 (BP Location: Right Arm)   Pulse (!) 123   Temp 98 F (36.7 C) (Oral)   Resp 18   Ht 5\' 6"  (1.676 m)   Wt 157 lb 6.4 oz (71.4 kg)   SpO2 100%   BMI 25.41 kg/m   General: 65yo F sitting up in bed, tearful and in acute distress Resp: Increased work of breathing with audible inspiratory and expiratory wheezing with majority of transmission from upper airway and no crackles or rhonchi Cardiac: tachycardic with regular rhythum, no MRG Ext: no LE edema or palpable cords   A/P: 66yo F with first admission for COPD exacerbation now with acutely worsening SOB without hypoxia. No significant response to scheduled duoneb treatments and solumedrol. EKG showed normal sinus rhythm and CXR showed no acute cardiopulmonary disease.  Official read for neck x-ray pending, but discussed study with Dr. Rosana Hoes with [facility] who felt there was no airway narrowing to explain upper airway sounds.  - CAT and transfer to SDU - troponin q6hrs - continue to closely monitor - consider d-dimer if no improvement with CAT  Eloise Levels, MD 10/19/2016, 4:13 PM PGY-1, [place] Medicine Service pager [phone]

## 2016-10-20 ENCOUNTER — Encounter: Payer: Self-pay | Admitting: Internal Medicine

## 2016-10-20 ENCOUNTER — Inpatient Hospital Stay (HOSPITAL_COMMUNITY): Payer: Medicare HMO

## 2016-10-20 LAB — CBC
HEMATOCRIT: 29.2 % — AB (ref 36.0–46.0)
HEMOGLOBIN: 9 g/dL — AB (ref 12.0–15.0)
MCH: 28.5 pg (ref 26.0–34.0)
MCHC: 30.8 g/dL (ref 30.0–36.0)
MCV: 92.4 fL (ref 78.0–100.0)
Platelets: 374 10*3/uL (ref 150–400)
RBC: 3.16 MIL/uL — ABNORMAL LOW (ref 3.87–5.11)
RDW: 14.7 % (ref 11.5–15.5)
WBC: 7.7 10*3/uL (ref 4.0–10.5)

## 2016-10-20 LAB — BLOOD GAS, ARTERIAL
Acid-Base Excess: 3.2 mmol/L — ABNORMAL HIGH (ref 0.0–2.0)
BICARBONATE: 27.9 mmol/L (ref 20.0–28.0)
Drawn by: 39899
O2 CONTENT: 2 L/min
O2 Saturation: 94.4 %
PCO2 ART: 48 mmHg (ref 32.0–48.0)
PH ART: 7.382 (ref 7.350–7.450)
PO2 ART: 73.4 mmHg — AB (ref 83.0–108.0)
Patient temperature: 98.6

## 2016-10-20 LAB — BASIC METABOLIC PANEL
ANION GAP: 7 (ref 5–15)
BUN: 10 mg/dL (ref 6–20)
CHLORIDE: 107 mmol/L (ref 101–111)
CO2: 29 mmol/L (ref 22–32)
Calcium: 8.5 mg/dL — ABNORMAL LOW (ref 8.9–10.3)
Creatinine, Ser: 0.78 mg/dL (ref 0.44–1.00)
GFR calc Af Amer: >60 mL/min (ref >60)
GFR calc non Af Amer: >60 mL/min (ref >60)
GLUCOSE: 115 mg/dL — AB (ref 65–99)
Potassium: 4.3 mmol/L (ref 3.5–5.1)
Sodium: 143 mmol/L (ref 135–145)

## 2016-10-20 LAB — BRAIN NATRIURETIC PEPTIDE: B Natriuretic Peptide: 156.1 pg/mL — ABNORMAL HIGH (ref 0.0–100.0)

## 2016-10-20 MED ORDER — SUCRALFATE 1 GM/10ML PO SUSP
1.0000 g | Freq: Three times a day (TID) | ORAL | Status: DC
  Administered 2016-10-20 – 2016-10-22 (×7): 1 g via ORAL
  Filled 2016-10-20 (×10): qty 10

## 2016-10-20 MED ORDER — IPRATROPIUM-ALBUTEROL 0.5-2.5 (3) MG/3ML IN SOLN
3.0000 mL | Freq: Four times a day (QID) | RESPIRATORY_TRACT | Status: DC
  Administered 2016-10-20 – 2016-10-21 (×5): 3 mL via RESPIRATORY_TRACT
  Filled 2016-10-20 (×5): qty 3

## 2016-10-20 MED ORDER — PANTOPRAZOLE SODIUM 20 MG PO TBEC
20.0000 mg | DELAYED_RELEASE_TABLET | Freq: Two times a day (BID) | ORAL | Status: DC
  Administered 2016-10-20 – 2016-10-22 (×4): 20 mg via ORAL
  Filled 2016-10-20 (×4): qty 1

## 2016-10-20 NOTE — Progress Notes (Signed)
Notified Teaching Service MD regarding observation and patient c/o difficulty with breathing.  Patient noted to have audible wheezing. RRT called to do bedside respiratory treatment.  Dr. Everlean Patterson at bedside assessing patient.  Will continue to assess and monitor for changes.

## 2016-10-20 NOTE — Progress Notes (Signed)
FPTS Interim Progress Note  S:Patient reports that she is feeling better compared to earlier in the day.  She was able to eat some of her dinner.  Reports comfort with breathing on [place].  O: BP (!) 145/83   Pulse 97   Temp 98 F (36.7 C) (Oral)   Resp 12   Ht 5\' 6"  (1.676 m)   Wt 157 lb 6.4 oz (71.4 kg)   SpO2 98%   BMI 25.41 kg/m   Gen: awake, alert, NAD, sitting up in bed having dinner Cardio: RRR Pulm: coarse breathsounds throughout w/ expiratory wheeze.  Similar sounds appreciated in the upper airways as well, normal WOB on 2L [place]  ABG    Component Value Date/Time   PHART 7.382 10/20/2016 1630   PCO2ART 48.0 10/20/2016 1630   PO2ART 73.4 (L) 10/20/2016 1630   HCO3 27.9 10/20/2016 1630   TCO2 25 10/06/2016 1132   O2SAT 94.4 10/20/2016 1630   A/P: [name] is a 66 y.o. female here for acute respiratory illness.  CT chest performed which was notable for emphysema but no acute processes.  Additionally, ABG with normal pH.  Stable on 2L O2 via [place]. - Continue to monitor - Consider c/s to ENT given transmission of upper airway sounds. - Day team to reassess in am  Janora Norlander, DO 10/20/2016, 7:43 PM PGY-3, [place] Medicine Service pager [phone]

## 2016-10-20 NOTE — Progress Notes (Signed)
Family Medicine Teaching Service Daily Progress Note Intern Pager: [phone]  Patient name: [name] Medical record number: [id number] Date of birth: Oct 01, 1950 Age: 66 y.o. Gender: female  Primary Care Provider: Smiley Houseman, MD Consultants: none Code Status: Full  Pt Overview and Major Events to Date:  1. Admitted to [facility] for dyspnea  Assessment and Plan: [name] is a 66 y.o. female presenting with dyspnea. PMH is significant for COPD, Asthma, GERD, hx of tobacco abuse, PVD with recent stent in femoral artery  COPD/Asthma overlap syndrome with acute exacerbation- Stable/Not improved. Satting 100%2L and audibly wheezing. Clinically was appearing much worse yesterday and was stridorous, tearful and uncomfortable. Required CAT x3, however never became hypoxic.  Neck X-ray normal and normal CXR, troponins negative. Did have a negative d-dimer, but cannot rule out PE at this point. Patient also had femoral artery stent placed last week and post-op pneumonitis is also on the differential.  - CT non-contrast - consider pulm c/s and ENT consult if clinically worsens or no improvement - vitals per floor - oxygen ordered goal to keep sats >88%; wean as able - duonebs q4 hours scheduled - spiriva added 2/16; will need at discharge - q2h PRN xopenex due to tachycardia - azithromycin (day 4/5) - prednisone day 5/5 - will need ambulation with O2 monitoring prior to d/c  Hypokalemia- Resolved. 3.2 on admission. -check BMP daily  PVD with recent femoral artery stenting- stent placed last week. 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   Objective: Temp:  [98 F (36.7 C)-98.8 F (37.1 C)] 98 F (36.7 C) (02/19 0700) Pulse Rate:  [93-124] 96 (02/19 0700) Resp:  [14-24] 14 (02/19 0700) BP: (135-158)/(71-83) 135/71 (02/19 0700) SpO2:  [97 %-100 %] 99 % (02/19 0700) FiO2 (%):  [32 %] 32 % (02/19 0326) Physical Exam: General: older woman, sitting up in bed, appears tired ENTM: dry MMs Cardiovascular: RRR, no MRG Respiratory: Diffuse inspiratory and expiratory wheezes, speaking in complete sentences.  Gastrointestinal: soft, NTND, +BS MSK: no edema or cyanosis Derm: warm, dry Neuro: AOx3 Psych: anxious mood and affect  Laboratory:  Recent Labs Lab 10/17/16 0151 10/19/16 0308 10/20/16 0223  WBC 6.8 8.8 7.7  HGB 10.9* 9.2* 9.0*  HCT 33.7* 29.3* 29.2*  PLT 318 321 374    Recent Labs Lab 10/16/16 1733  10/18/16 0419 10/19/16 0308 10/20/16 0223  NA 136  < > 141 140 143  K 3.2*  < > 4.0 4.8 4.3  CL 96*  < > 107 108 107  CO2 27  < > 26 24 29   BUN <5*  < > 11 8 10   CREATININE 0.78  < > 0.80 0.77 0.78  CALCIUM 9.2  < > 8.5* 8.3* 8.5*  PROT 8.0  --   --   --   --   BILITOT 0.5  --   --   --   --   ALKPHOS 96  --   --   --   --   ALT 10*  --   --   --   --   AST 24  --   --   --   --  GLUCOSE 124*  < > 129* 103* 115*  < > = values in this interval not displayed.  Imaging/Diagnostic Tests: Dg Neck Soft Tissue  Result Date: 10/19/2016 CLINICAL DATA:  66 year old female with wheezing and shortness of breath. Initial encounter. EXAM: NECK SOFT TISSUES - 1+ VIEW COMPARISON:  Chest radiographs today reported separately and earlier. FINDINGS: Upright portable and cross-table lateral views of the neck soft tissues. Prevertebral and pharyngeal soft tissue contours are within normal limits. Normal epiglottis. The tracheal air column is difficult to delineate at the thoracic inlet but appears normal in the chest. Negative lung  apices. Calcified right cervical carotid atherosclerosis. Osteopenia. IMPRESSION: Normal epiglottis and pharyngeal soft tissue contours. If the patient is stridorous then recommend Neck CT (IV contrast preferred) as subglottic tracheal stenosis is difficult to exclude on these images. Electronically Signed   By: Genevie Ann M.D.   On: 10/19/2016 17:03   Dg Chest Port 1 View  Result Date: 10/19/2016 CLINICAL DATA:  66 year old female with history of wheezing and difficulty breathing. EXAM: PORTABLE CHEST 1 VIEW COMPARISON:  Chest x-ray 10/18/2016. FINDINGS: Lung volumes are normal. No consolidative airspace disease. No pleural effusions. No pneumothorax. No pulmonary nodule or mass noted. Pulmonary vasculature and the cardiomediastinal silhouette are within normal limits. Aortic atherosclerosis. IMPRESSION: 1. No radiographic evidence of acute cardiopulmonary disease. 2. Aortic atherosclerosis. Electronically Signed   By: Vinnie Langton M.D.   On: 10/19/2016 17:00    Eloise Levels, MD 10/20/2016, 8:11 AM PGY-1, [place] Intern pager: [phone], text pages welcome

## 2016-10-20 NOTE — Progress Notes (Signed)
Late Entry: Patient seen 10/19/16.   FPTS Interim Progress Note  I saw Ms. Counts yesterday while she was hospitalized on the FPTS inpatient service as I was cross cover. Patient was admitted for COPD/asthma exacerbation requiring supplemental O2 via [place].  Late yesterday afternoon (around 3-4PM) she had an acute worsening of her respiratory status. She did not have desaturations on supplemental oxygen but clearly had increased work of breathing with wheezing/stridor.. Repeat STAT CXR and x-ray soft tissue neck where unremarkable. EKG was unchanged and troponin was negative. She was started on CAT and had 2-3 treatments of this which significantly improved her respiratory status. On re-evaluation last night, she was breathing much more comfortably and able speak full sentences; she continued to have wheezing diffusely on lung auscultation but improved from earlier in the day. We switched her back to scheduled duonebs q 4 hours and PRN albuterol. She has been receiving steroids and antibiotics since admission. She is having an overall slow improvement during this hospitalization despite the appropriate treatment for COPD/asthma exacerbation. Additionally, this is her first exacerbation requiring hospitalization. Discussed with attending if pulmonology consult would be beneficial for further evaluation/recommendations. Inpatient team to decide.   Smiley Houseman, MD 10/20/2016, 8:10 AM PGY-2 [place]

## 2016-10-21 MED ORDER — TRIPLE ANTIBIOTIC 3.5-400-5000 EX OINT
1.0000 "application " | TOPICAL_OINTMENT | Freq: Once | CUTANEOUS | Status: DC | PRN
  Filled 2016-10-21: qty 1

## 2016-10-21 MED ORDER — OXYMETAZOLINE HCL 0.05 % NA SOLN
1.0000 | Freq: Once | NASAL | Status: DC | PRN
  Filled 2016-10-21: qty 15

## 2016-10-21 MED ORDER — SILVER NITRATE-POT NITRATE 75-25 % EX MISC
1.0000 | Freq: Once | CUTANEOUS | Status: DC | PRN
  Filled 2016-10-21: qty 1

## 2016-10-21 MED ORDER — LIDOCAINE-EPINEPHRINE (PF) 1.5 %-1:200000 IJ SOLN
30.0000 mL | Freq: Once | INTRAMUSCULAR | Status: DC
  Filled 2016-10-21: qty 30

## 2016-10-21 MED ORDER — IPRATROPIUM-ALBUTEROL 0.5-2.5 (3) MG/3ML IN SOLN
3.0000 mL | Freq: Four times a day (QID) | RESPIRATORY_TRACT | Status: DC | PRN
  Administered 2016-10-22 (×2): 3 mL via RESPIRATORY_TRACT
  Filled 2016-10-21 (×2): qty 3

## 2016-10-21 MED ORDER — LIDOCAINE-EPINEPHRINE (PF) 1 %-1:200000 IJ SOLN
0.0000 mL | Freq: Once | INTRAMUSCULAR | Status: DC | PRN
  Filled 2016-10-21: qty 30

## 2016-10-21 MED ORDER — LIDOCAINE HCL 2 % EX GEL
1.0000 "application " | Freq: Once | CUTANEOUS | Status: DC | PRN
  Filled 2016-10-21: qty 5

## 2016-10-21 MED ORDER — LIDOCAINE HCL 4 % EX SOLN
0.0000 mL | Freq: Once | CUTANEOUS | Status: DC | PRN
  Filled 2016-10-21: qty 50

## 2016-10-21 NOTE — Progress Notes (Signed)
Family Medicine Teaching Service Daily Progress Note Intern Pager: [phone]  Patient name: [name] Medical record number: [id number] Date of birth: May 14, 1951 Age: 66 y.o. Gender: female  Primary Care Provider: Smiley Houseman, MD Consultants: none Code Status: Full  Pt Overview and Major Events to Date:  1. Admitted to [place] for dyspnea  Assessment and Plan: [name] is a 66 y.o. female presenting with dyspnea. PMH is significant for COPD, Asthma, GERD, hx of tobacco abuse, PVD with recent stent in femoral artery  COPD/Asthma overlap syndrome with acute exacerbation- Stable/Not improved. Satting 100%2L and appearing comfortable in LLD position. ABG showing low O2 despite being on 2LO2. CT no acute processes. Seen by ENT today who felt no there was no evidence of upper airway disease and all sx were tracheal and bronchial. Feels that reflux is triggering some of the inflammation.  - ENT consulted; appreciate advice - continue protonix - vitals per floor - oxygen ordered goal to keep sats >88%, but low 90s, wean off O2 - duonebs q4 hours PRN - spiriva added 2/16; will need at discharge - q2h PRN xopenex due to tachycardia - azithromycin (day 4/5) - prednisone day 5/5 - will need ambulation with O2 monitoring prior to d/c  Hypokalemia- Resolved. 3.2 on admission. -check BMP daily  PVD with recent femoral artery stenting- stent placed last week. 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   Objective: Temp:  [98.2 F (36.8 C)-99 F (37.2 C)] 98.2  F (36.8 C) (02/20 0728) Pulse Rate:  [78-98] 78 (02/20 0728) Resp:  [12-21] 21 (02/20 0728) BP: (142-161)/(81-97) 154/97 (02/20 0728) SpO2:  [97 %-100 %] 100 % (02/20 0750) Physical Exam: General: older woman, sitting up in bed, appears tired ENTM: dry MMs Cardiovascular: RRR, no MRG Respiratory: Diffuse inspiratory and expiratory wheezes, speaking in complete sentences.  Gastrointestinal: soft, NTND, +BS MSK: no edema or cyanosis Derm: warm, dry Neuro: AOx3 Psych: anxious mood and affect  Laboratory:  Recent Labs Lab 10/17/16 0151 10/19/16 0308 10/20/16 0223  WBC 6.8 8.8 7.7  HGB 10.9* 9.2* 9.0*  HCT 33.7* 29.3* 29.2*  PLT 318 321 374    Recent Labs Lab 10/16/16 1733  10/18/16 0419 10/19/16 0308 10/20/16 0223  NA 136  < > 141 140 143  K 3.2*  < > 4.0 4.8 4.3  CL 96*  < > 107 108 107  CO2 27  < > 26 24 29   BUN <5*  < > 11 8 10   CREATININE 0.78  < > 0.80 0.77 0.78  CALCIUM 9.2  < > 8.5* 8.3* 8.5*  PROT 8.0  --   --   --   --   BILITOT 0.5  --   --   --   --   ALKPHOS 96  --   --   --   --   ALT 10*  --   --   --   --   AST 24  --   --   --   --   GLUCOSE 124*  < > 129* 103* 115*  < > = values in this interval not displayed.  Imaging/Diagnostic Tests: Ct Chest Wo Contrast  Result Date: 10/20/2016 CLINICAL DATA:  Chest pain and shortness of breath for 3 days. EXAM: CT CHEST WITHOUT CONTRAST TECHNIQUE: Multidetector CT imaging of the chest was performed following the standard protocol without IV contrast. COMPARISON:  Single-view of the chest 10/19/2016. PA and lateral chest 05/16/2014. FINDINGS: Cardiovascular: Calcific aortic and coronary atherosclerosis is seen. Heart size is normal. No pericardial effusion. Mediastinum/Nodes: No enlarged mediastinal or axillary lymph nodes. Thyroid gland, trachea, and esophagus demonstrate no significant findings. Lungs/Pleura: No pleural effusion. Centrilobular emphysematous disease is seen. Mild dependent atelectasis is noted.  Upper Abdomen: Punctate calcification in the spleen is noted. No acute abnormality. Musculoskeletal: No lytic or sclerotic lesion. Prominent Schmorl's nodes in the superior endplates of T5 and T9 are identified. Mild, remote compression fracture deformity of T7 is seen. IMPRESSION: No acute disease. Emphysema. Calcific aortic and coronary atherosclerosis. Electronically Signed   By: Inge Rise M.D.   On: 10/20/2016 19:14    Eloise Levels, MD 10/21/2016, 9:46 AM PGY-1, [place] Intern pager: [phone], text pages welcome

## 2016-10-21 NOTE — Consult Note (Signed)
Reason for Consult: Respiratory problems Referring Physician: Lind Covert, MD  [name] is an 65 y.o. female.  HPI: Past couple of weeks she has had severe cough, and worsening of her typical wheezing. She suffers with chronic COPD and asthma. She quit smoking last month. She suffers with chronic heartburn. She drinks Coca-[employer] all day long. She has responded reasonably well to medical therapy over the past day or 2. She denies any sore throat or trouble swallowing. She had a little bit of sore throat yesterday but that seems to have cleared with Tylenol.  Past Medical History:  Diagnosis Date  . Asthma   . COPD (chronic obstructive pulmonary disease) ([place])   . Hx of colonic polyps   . Nausea & vomiting   . PSORIASIS 12/29/2006   Qualifier: Diagnosis of  By: Jobe Igo MD, Shanon Brow    . Smoker     Past Surgical History:  Procedure Laterality Date  . ABDOMINAL AORTOGRAM W/LOWER EXTREMITY N/A 10/06/2016   Procedure: Abdominal Aortogram w/Lower Extremity;  Surgeon: Waynetta Sandy, MD;  Location: [facility];  Service: Cardiovascular;  Laterality: N/A;  . APPENDECTOMY    . COLONOSCOPY W/ POLYPECTOMY  02/26/2005  . HERNIA REPAIR     x 2  . PARTIAL HYSTERECTOMY    . PERIPHERAL VASCULAR ATHERECTOMY Right 10/06/2016   Procedure: Peripheral Vascular Atherectomy;  Surgeon: Waynetta Sandy, MD;  Location: [facility];  Service: Cardiovascular;  Laterality: Right;  . PERIPHERAL VASCULAR INTERVENTION Right 10/06/2016   Procedure: Peripheral Vascular Intervention;  Surgeon: Waynetta Sandy, MD;  Location: [facility];  Service: Cardiovascular;  Laterality: Right;  SFA  . TONSILLECTOMY    . UPPER GASTROINTESTINAL ENDOSCOPY  03/22/2010, 02/13/2005    Family History  Problem Relation Age of Onset  . Cancer Other   . Coronary artery disease Other   . Cancer Mother   . Hypertension Mother   . Heart disease Mother   . Cancer Father   .  Hypertension Father   . Cancer Sister   . Hypertension Sister     Social History:  reports that she quit smoking 12 days ago. Her smoking use included Cigarettes. She smoked 0.25 packs per day. She has never used smokeless tobacco. She reports that she uses drugs, including Marijuana. She reports that she does not drink alcohol.  Allergies: No Known Allergies  Medications: Reviewed  Results for orders placed or performed during the hospital encounter of 10/16/16 (from the past 48 hour(s))  Troponin I (q 6hr x 3)     Status: None   Collection Time: 10/19/16  4:23 PM  Result Value Ref Range   Troponin I <0.03 <0.03 ng/mL  D-dimer, quantitative (not at [facility])     Status: None   Collection Time: 10/19/16  6:19 PM  Result Value Ref Range   D-Dimer, Quant 0.46 0.00 - 0.50 ug/mL-FEU    Comment: (NOTE) At the manufacturer cut-off of 0.50 ug/mL FEU, this assay has been documented to exclude PE with a sensitivity and negative predictive value of 97 to 99%.  At this time, this assay has not been approved by the FDA to exclude DVT/VTE. Results should be correlated with clinical presentation.   Basic metabolic panel     Status: Abnormal   Collection Time: 10/20/16  2:23 AM  Result Value Ref Range   Sodium 143 135 - 145 mmol/L   Potassium 4.3 3.5 - 5.1 mmol/L   Chloride 107 101 -  111 mmol/L   CO2 29 22 - 32 mmol/L   Glucose, Bld 115 (H) 65 - 99 mg/dL   BUN 10 6 - 20 mg/dL   Creatinine, Ser 0.78 0.44 - 1.00 mg/dL   Calcium 8.5 (L) 8.9 - 10.3 mg/dL   GFR calc non Af Amer >60 >60 mL/min   GFR calc Af Amer >60 >60 mL/min    Comment: (NOTE) The eGFR has been calculated using the CKD EPI equation. This calculation has not been validated in all clinical situations. eGFR's persistently <60 mL/min signify possible Chronic Kidney Disease.    Anion gap 7 5 - 15  CBC     Status: Abnormal   Collection Time: 10/20/16  2:23 AM  Result Value Ref Range   WBC 7.7 4.0 - 10.5 K/uL   RBC 3.16 (L) 3.87  - 5.11 MIL/uL   Hemoglobin 9.0 (L) 12.0 - 15.0 g/dL   HCT 29.2 (L) 36.0 - 46.0 %   MCV 92.4 78.0 - 100.0 fL   MCH 28.5 26.0 - 34.0 pg   MCHC 30.8 30.0 - 36.0 g/dL   RDW 14.7 11.5 - 15.5 %   Platelets 374 150 - 400 K/uL  Brain natriuretic peptide     Status: Abnormal   Collection Time: 10/20/16 12:36 PM  Result Value Ref Range   B Natriuretic Peptide 156.1 (H) 0.0 - 100.0 pg/mL  Blood gas, arterial     Status: Abnormal   Collection Time: 10/20/16  4:30 PM  Result Value Ref Range   O2 Content 2.0 L/min   pH, Arterial 7.382 7.350 - 7.450   pCO2 arterial 48.0 32.0 - 48.0 mmHg   pO2, Arterial 73.4 (L) 83.0 - 108.0 mmHg   Bicarbonate 27.9 20.0 - 28.0 mmol/L   Acid-Base Excess 3.2 (H) 0.0 - 2.0 mmol/L   O2 Saturation 94.4 %   Patient temperature 98.6    Collection site RIGHT RADIAL    Drawn by [phone]    Sample type ARTERIAL DRAW    Allens test (pass/fail) PASS PASS    Dg Neck Soft Tissue  Result Date: 10/19/2016 CLINICAL DATA:  66 year old female with wheezing and shortness of breath. Initial encounter. EXAM: NECK SOFT TISSUES - 1+ VIEW COMPARISON:  Chest radiographs today reported separately and earlier. FINDINGS: Upright portable and cross-table lateral views of the neck soft tissues. Prevertebral and pharyngeal soft tissue contours are within normal limits. Normal epiglottis. The tracheal air column is difficult to delineate at the thoracic inlet but appears normal in the chest. Negative lung apices. Calcified right cervical carotid atherosclerosis. Osteopenia. IMPRESSION: Normal epiglottis and pharyngeal soft tissue contours. If the patient is stridorous then recommend Neck CT (IV contrast preferred) as subglottic tracheal stenosis is difficult to exclude on these images. Electronically Signed   By: Genevie Ann M.D.   On: 10/19/2016 17:03   Ct Chest Wo Contrast  Result Date: 10/20/2016 CLINICAL DATA:  Chest pain and shortness of breath for 3 days. EXAM: CT CHEST WITHOUT CONTRAST TECHNIQUE:  Multidetector CT imaging of the chest was performed following the standard protocol without IV contrast. COMPARISON:  Single-view of the chest 10/19/2016. PA and lateral chest 05/16/2014. FINDINGS: Cardiovascular: Calcific aortic and coronary atherosclerosis is seen. Heart size is normal. No pericardial effusion. Mediastinum/Nodes: No enlarged mediastinal or axillary lymph nodes. Thyroid gland, trachea, and esophagus demonstrate no significant findings. Lungs/Pleura: No pleural effusion. Centrilobular emphysematous disease is seen. Mild dependent atelectasis is noted. Upper Abdomen: Punctate calcification in the spleen is noted. No  acute abnormality. Musculoskeletal: No lytic or sclerotic lesion. Prominent Schmorl's nodes in the superior endplates of T5 and T9 are identified. Mild, remote compression fracture deformity of T7 is seen. IMPRESSION: No acute disease. Emphysema. Calcific aortic and coronary atherosclerosis. Electronically Signed   By: Inge Rise M.D.   On: 10/20/2016 19:14   Dg Chest Port 1 View  Result Date: 10/19/2016 CLINICAL DATA:  66 year old female with history of wheezing and difficulty breathing. EXAM: PORTABLE CHEST 1 VIEW COMPARISON:  Chest x-ray 10/18/2016. FINDINGS: Lung volumes are normal. No consolidative airspace disease. No pleural effusions. No pneumothorax. No pulmonary nodule or mass noted. Pulmonary vasculature and the cardiomediastinal silhouette are within normal limits. Aortic atherosclerosis. IMPRESSION: 1. No radiographic evidence of acute cardiopulmonary disease. 2. Aortic atherosclerosis. Electronically Signed   By: Vinnie Langton M.D.   On: 10/19/2016 17:00    [id number] except as listed in admit H&P  Blood pressure (!) 160/92, pulse 94, temperature 98.2 F (36.8 C), temperature source Oral, resp. rate 16, height '5\' 6"'  (1.676 m), weight 71.4 kg (157 lb 6.4 oz), SpO2 99 %.  PHYSICAL EXAM: Overall appearance:  Healthy appearing, in no distress, audible  wheezing, and throughout the examination, several paroxysms of cough. There is no stridor. Head:  Normocephalic, atraumatic. Ears: External ears look healthy. Nose: External nose is healthy in appearance. Internal nasal exam free of any lesions or obstruction. Oral Cavity/Pharynx:  There are no mucosal lesions or masses identified. Tonsils are surgically absent. Larynx/Hypopharynx: Deferred Neuro:  No identifiable neurologic deficits. Neck: No palpable neck masses. Chest: Diffuse bilateral wheezes. There is no audible stridor or upper respiratory noise.  Studies Reviewed: CT of chest, upper cuts revealed no pathology in the larynx up to the level of the false cords.  Procedures: none   Assessment/Plan: No evidence of upper airway disease. All of her symptoms seem to be tracheal and bronchial in origin. It is good that she has quit smoking unfortunately only last month. Her other biggest problem is chronic acid reflux. I discussed with her the importance of cutting back or eliminating caffeine which may be one of her biggest reflux triggers. The reflux may be triggering some of the tracheal and bronchial inflammation. Call if any additional input as necessary.  Jonelle Bann 10/21/2016, 12:49 PM

## 2016-10-22 ENCOUNTER — Other Ambulatory Visit: Payer: Self-pay | Admitting: *Deleted

## 2016-10-22 DIAGNOSIS — I70209 Unspecified atherosclerosis of native arteries of extremities, unspecified extremity: Secondary | ICD-10-CM

## 2016-10-22 LAB — BASIC METABOLIC PANEL
Anion gap: 9 (ref 5–15)
BUN: 11 mg/dL (ref 6–20)
CALCIUM: 8.4 mg/dL — AB (ref 8.9–10.3)
CHLORIDE: 103 mmol/L (ref 101–111)
CO2: 28 mmol/L (ref 22–32)
Creatinine, Ser: 0.81 mg/dL (ref 0.44–1.00)
GFR calc non Af Amer: >60 mL/min (ref >60)
Glucose, Bld: 103 mg/dL — ABNORMAL HIGH (ref 65–99)
Potassium: 4.2 mmol/L (ref 3.5–5.1)
SODIUM: 140 mmol/L (ref 135–145)

## 2016-10-22 LAB — CBC
HCT: 29.3 % — ABNORMAL LOW (ref 36.0–46.0)
Hemoglobin: 9.1 g/dL — ABNORMAL LOW (ref 12.0–15.0)
MCH: 28.4 pg (ref 26.0–34.0)
MCHC: 31.1 g/dL (ref 30.0–36.0)
MCV: 91.6 fL (ref 78.0–100.0)
PLATELETS: 402 10*3/uL — AB (ref 150–400)
RBC: 3.2 MIL/uL — AB (ref 3.87–5.11)
RDW: 14.7 % (ref 11.5–15.5)
WBC: 8.3 10*3/uL (ref 4.0–10.5)

## 2016-10-22 MED ORDER — IPRATROPIUM BROMIDE HFA 17 MCG/ACT IN AERS: 2.0000 | INHALATION_SPRAY | Freq: Four times a day (QID) | RESPIRATORY_TRACT | 12 refills | Status: DC | PRN

## 2016-10-22 MED ORDER — TRAMADOL HCL 50 MG PO TABS: 50.0000 mg | ORAL_TABLET | Freq: Four times a day (QID) | ORAL | 0 refills | Status: DC | PRN

## 2016-10-22 MED ORDER — TIOTROPIUM BROMIDE MONOHYDRATE 18 MCG IN CAPS: 18.0000 ug | ORAL_CAPSULE | Freq: Every day | RESPIRATORY_TRACT | 12 refills | Status: DC

## 2016-10-22 MED ORDER — PANTOPRAZOLE SODIUM 20 MG PO TBEC: 20.0000 mg | DELAYED_RELEASE_TABLET | Freq: Every day | ORAL | 0 refills | Status: DC

## 2016-10-22 MED ORDER — SUCRALFATE 1 GM/10ML PO SUSP: 1.0000 g | Freq: Three times a day (TID) | ORAL | 0 refills | Status: DC

## 2016-10-22 NOTE — Progress Notes (Signed)
Family Medicine Teaching Service Daily Progress Note Intern Pager: [phone]  Patient name: [name] Medical record number: [id number] Date of birth: Aug 24, 1951 Age: 66 y.o. Gender: female  Primary Care Provider: Smiley Houseman, MD Consultants: none Code Status: Full  Pt Overview and Major Events to Date:  1. Admitted to [facility] for dyspnea  Assessment and Plan: [name] is a 66 y.o. female presenting with dyspnea. PMH is significant for COPD, Asthma, GERD, hx of tobacco abuse, PVD with recent stent in femoral artery  COPD/Asthma overlap syndrome with acute exacerbation- Stable. Satting 96% RA and comfortable. Transitioned to PRN albuterol yesterday.  Ambulating pulse ox today and likely discharge - ENT consulted; appreciate advice - continue protonix - vitals per floor - oxygen ordered goal to keep sats >88%, but low 90s, wean off O2 - duonebs q4 hours PRN - spiriva added 2/16; will need at discharge - q2h PRN xopenex due to tachycardia - azithromycin (day 5/5)  Hypokalemia- Resolved. 3.2 on admission. -check BMP daily  PVD with recent femoral artery stenting- stent placed last week. 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 Eating breakfast and feeling well.  No concerns   Objective: Temp:  [98.2 F (36.8 C)-98.7 F (37.1 C)] 98.5 F (36.9 C) (02/21 0300) Pulse Rate:  [90-96] 90 (02/21 0000) Resp:  [16-26] 25 (02/21 0300) BP: (126-160)/(80-99) 149/92 (02/21 0000) SpO2:  [95 %-100 %] 98 % (02/21 0000) Physical Exam: General: older woman, sitting up in bed, appears tired ENTM: dry MMs Cardiovascular:  RRR, no MRG Respiratory: Diffuse inspiratory and expiratory wheezes, speaking in complete sentences.  Gastrointestinal: soft, NTND, +BS MSK: no edema or cyanosis Derm: warm, dry Neuro: AOx3 Psych: anxious mood and affect  Laboratory:  Recent Labs Lab 10/19/16 0308 10/20/16 0223 10/22/16 0203  WBC 8.8 7.7 8.3  HGB 9.2* 9.0* 9.1*  HCT 29.3* 29.2* 29.3*  PLT 321 374 402*    Recent Labs Lab 10/16/16 1733  10/19/16 0308 10/20/16 0223 10/22/16 0203  NA 136  < > 140 143 140  K 3.2*  < > 4.8 4.3 4.2  CL 96*  < > 108 107 103  CO2 27  < > 24 29 28   BUN <5*  < > 8 10 11   CREATININE 0.78  < > 0.77 0.78 0.81  CALCIUM 9.2  < > 8.3* 8.5* 8.4*  PROT 8.0  --   --   --   --   BILITOT 0.5  --   --   --   --   ALKPHOS 96  --   --   --   --   ALT 10*  --   --   --   --   AST 24  --   --   --   --   GLUCOSE 124*  < > 103* 115* 103*  < > = values in this interval not displayed.  Imaging/Diagnostic Tests: No results found.  Eloise Levels, MD 10/22/2016, 7:36 AM PGY-1, [place] Intern pager: [phone], text pages welcome

## 2016-10-22 NOTE — Discharge Instructions (Signed)
You were admitted for COPD and asthma exacerbation.  We feel that acid reflux plays a big role in your symptoms.  You should take your protonix daily to help with reflux and carafate and tums as needed.  Please avoid caffeine (soda AND coffee).  I have prescribed you a new medicine called spiriva and you will use this once daily.  Additionally, if you are having trouble breathing you can use your atrovent inhaler.   Please follow up on 10/30/16 at 2PM at the family medicine center.

## 2016-10-24 ENCOUNTER — Other Ambulatory Visit: Payer: Self-pay | Admitting: Family Medicine

## 2016-10-24 ENCOUNTER — Telehealth: Payer: Self-pay | Admitting: *Deleted

## 2016-10-24 MED ORDER — ALBUTEROL SULFATE (2.5 MG/3ML) 0.083% IN NEBU: 2.5000 mg | INHALATION_SOLUTION | Freq: Four times a day (QID) | RESPIRATORY_TRACT | 1 refills | Status: DC | PRN

## 2016-10-24 NOTE — Telephone Encounter (Signed)
[facility] Post-discharge Follow-Up Phone Call:  Date of Discharge: 10/22/2016 Principal Discharge Diagnosis(es): COPD Exacerbation Post-discharge Communication: Attempt #1 to reach patient and complete post-discharge follow-up phone call. Call placed to [phone]; unable to reach patient. HIPPA compliant voicemail left requesting return call. Call Completed: No                     Hubbard Hartshorn, RN, BSN

## 2016-10-29 NOTE — Telephone Encounter (Signed)
[facility] Post-discharge Follow-Up Phone Call:  Date of Discharge: 10/22/2016 Principal Discharge Diagnosis(es): COPD Exacerbation Post-discharge Communication: Attempt #2 to reach patient and complete post-discharge follow-up phone call. Call placed to [phone]; unable to reach patient. Voicemail left requesting return call and time of tomorrow's appt. Call Completed: No                     Hubbard Hartshorn, RN, BSN

## 2016-10-30 ENCOUNTER — Inpatient Hospital Stay: Payer: Medicare HMO | Admitting: Family Medicine

## 2016-10-30 NOTE — Telephone Encounter (Signed)
Attempted to reach patient to remind her of today's appt with Dr. Andy Gauss at 2 pm. Phone goes straight to VM. Message left on VM with time of today's appt. Hubbard Hartshorn, RN, BSN

## 2016-11-04 ENCOUNTER — Encounter (HOSPITAL_COMMUNITY): Payer: Medicare HMO

## 2016-11-05 ENCOUNTER — Ambulatory Visit (INDEPENDENT_AMBULATORY_CARE_PROVIDER_SITE_OTHER)
Admission: RE | Admit: 2016-11-05 | Discharge: 2016-11-05 | Disposition: A | Discharge status: Home / Self Care | Payer: Medicare HMO | Source: Ambulatory Visit | Attending: Vascular Surgery | Admitting: Vascular Surgery

## 2016-11-05 ENCOUNTER — Ambulatory Visit (HOSPITAL_COMMUNITY)
Admission: RE | Admit: 2016-11-05 | Discharge: 2016-11-05 | Disposition: A | Discharge status: Home / Self Care | Payer: Medicare HMO | Source: Ambulatory Visit | Attending: Vascular Surgery | Admitting: Vascular Surgery

## 2016-11-05 ENCOUNTER — Inpatient Hospital Stay: Payer: Medicare HMO | Admitting: Internal Medicine

## 2016-11-05 DIAGNOSIS — I70209 Unspecified atherosclerosis of native arteries of extremities, unspecified extremity: Secondary | ICD-10-CM

## 2016-11-05 DIAGNOSIS — I739 Peripheral vascular disease, unspecified: Secondary | ICD-10-CM

## 2016-11-05 DIAGNOSIS — L98491 Non-pressure chronic ulcer of skin of other sites limited to breakdown of skin: Secondary | ICD-10-CM

## 2016-11-05 DIAGNOSIS — I743 Embolism and thrombosis of arteries of the lower extremities: Secondary | ICD-10-CM | POA: Diagnosis not present

## 2016-11-07 ENCOUNTER — Encounter: Payer: Self-pay | Admitting: Vascular Surgery

## 2016-11-11 ENCOUNTER — Encounter (HOSPITAL_COMMUNITY): Payer: Medicare HMO

## 2016-11-14 ENCOUNTER — Encounter: Payer: Medicare HMO | Admitting: Vascular Surgery

## 2017-03-08 ENCOUNTER — Other Ambulatory Visit: Payer: Self-pay | Admitting: Internal Medicine

## 2017-04-14 ENCOUNTER — Telehealth: Payer: Self-pay | Admitting: Internal Medicine

## 2017-04-14 NOTE — Telephone Encounter (Signed)
Error//ep

## 2017-06-16 ENCOUNTER — Other Ambulatory Visit: Payer: Self-pay

## 2017-06-16 ENCOUNTER — Encounter (HOSPITAL_COMMUNITY): Payer: Self-pay | Admitting: *Deleted

## 2017-06-16 ENCOUNTER — Emergency Department (HOSPITAL_COMMUNITY): Payer: Medicare HMO

## 2017-06-16 ENCOUNTER — Emergency Department (HOSPITAL_COMMUNITY)
Admission: EM | Admit: 2017-06-16 | Discharge: 2017-06-16 | Disposition: A | Discharge status: Home / Self Care | Payer: Medicare HMO | Attending: Emergency Medicine | Admitting: Emergency Medicine

## 2017-06-16 DIAGNOSIS — J45909 Unspecified asthma, uncomplicated: Secondary | ICD-10-CM | POA: Diagnosis not present

## 2017-06-16 DIAGNOSIS — R0789 Other chest pain: Secondary | ICD-10-CM | POA: Diagnosis not present

## 2017-06-16 DIAGNOSIS — Z7902 Long term (current) use of antithrombotics/antiplatelets: Secondary | ICD-10-CM | POA: Diagnosis not present

## 2017-06-16 DIAGNOSIS — J449 Chronic obstructive pulmonary disease, unspecified: Secondary | ICD-10-CM | POA: Insufficient documentation

## 2017-06-16 DIAGNOSIS — R0602 Shortness of breath: Secondary | ICD-10-CM

## 2017-06-16 DIAGNOSIS — Z87891 Personal history of nicotine dependence: Secondary | ICD-10-CM | POA: Diagnosis not present

## 2017-06-16 DIAGNOSIS — Z79899 Other long term (current) drug therapy: Secondary | ICD-10-CM | POA: Diagnosis not present

## 2017-06-16 LAB — BASIC METABOLIC PANEL
ANION GAP: 9 (ref 5–15)
BUN: 6 mg/dL (ref 6–20)
CHLORIDE: 105 mmol/L (ref 101–111)
CO2: 24 mmol/L (ref 22–32)
Calcium: 9.5 mg/dL (ref 8.9–10.3)
Creatinine, Ser: 0.87 mg/dL (ref 0.44–1.00)
GFR calc Af Amer: >60 mL/min (ref >60)
GFR calc non Af Amer: >60 mL/min (ref >60)
GLUCOSE: 124 mg/dL — AB (ref 65–99)
POTASSIUM: 4.1 mmol/L (ref 3.5–5.1)
Sodium: 138 mmol/L (ref 135–145)

## 2017-06-16 LAB — CBC WITH DIFFERENTIAL/PLATELET
Basophils Absolute: 0 10*3/uL (ref 0.0–0.1)
Basophils Relative: 0 %
Eosinophils Absolute: 0 10*3/uL (ref 0.0–0.7)
Eosinophils Relative: 0 %
HEMATOCRIT: 36.9 % (ref 36.0–46.0)
Hemoglobin: 11.9 g/dL — ABNORMAL LOW (ref 12.0–15.0)
LYMPHS ABS: 3.3 10*3/uL (ref 0.7–4.0)
LYMPHS PCT: 42 %
MCH: 29.5 pg (ref 26.0–34.0)
MCHC: 32.2 g/dL (ref 30.0–36.0)
MCV: 91.6 fL (ref 78.0–100.0)
MONO ABS: 0.3 10*3/uL (ref 0.1–1.0)
MONOS PCT: 4 %
NEUTROS ABS: 4.2 10*3/uL (ref 1.7–7.7)
Neutrophils Relative %: 54 %
Platelets: 283 10*3/uL (ref 150–400)
RBC: 4.03 MIL/uL (ref 3.87–5.11)
RDW: 14.3 % (ref 11.5–15.5)
WBC: 7.9 10*3/uL (ref 4.0–10.5)

## 2017-06-16 MED ORDER — IPRATROPIUM-ALBUTEROL 0.5-2.5 (3) MG/3ML IN SOLN
3.0000 mL | Freq: Once | RESPIRATORY_TRACT | Status: AC
  Administered 2017-06-16: 3 mL via RESPIRATORY_TRACT

## 2017-06-16 MED ORDER — PREDNISONE 20 MG PO TABS
60.0000 mg | ORAL_TABLET | Freq: Once | ORAL | Status: AC
  Administered 2017-06-16: 60 mg via ORAL
  Filled 2017-06-16: qty 3

## 2017-06-16 MED ORDER — ALBUTEROL SULFATE HFA 108 (90 BASE) MCG/ACT IN AERS
1.0000 | INHALATION_SPRAY | Freq: Once | RESPIRATORY_TRACT | Status: AC
  Administered 2017-06-16: 1 via RESPIRATORY_TRACT
  Filled 2017-06-16: qty 6.7

## 2017-06-16 MED ORDER — PREDNISONE 10 MG PO TABS: 40.0000 mg | ORAL_TABLET | Freq: Every day | ORAL | 0 refills | Status: AC

## 2017-06-16 NOTE — Discharge Instructions (Signed)
Take prednisone pills as prescribed.  Use inhaler as needed for shortness of breath or chest tightness.  Follow up with your primary care doctor for refill of medications and evaluation of your breathing Return to the ER if you have chest pain, difficulty breathing, fevers, chills, or any new or worsening symptoms.

## 2017-06-16 NOTE — ED Notes (Signed)
Upon entering room to discharge patient RN noted pt tearful. Family sts patient c/o of chest pain. Pt pointing to epigastric region and states "I feel like my heart is broken, I just lost my brother and it hurts". Pt rates non-radiating pain 8/10, tender to palpation with no associated symptoms. EKG completed and shown to doctor.

## 2017-06-16 NOTE — ED Triage Notes (Signed)
Pt came down to ED for asthma exacerbation.  Pt was upstairs with her brother on the 6th floor when he died.  Pt is very upset, crying and having some wheezing.  Pt denies any Cp and states that she does not have her inhaler with her and needs a breathing treatment.  Pt is able to speak in full sentences.

## 2017-06-16 NOTE — ED Provider Notes (Signed)
[facility] EMERGENCY DEPARTMENT Provider Note   CSN: [id number] Arrival date & time: 06/16/17  1626     History   Chief Complaint Chief Complaint  Patient presents with  . Asthma    HPI [name] is a 66 y.o. female presenting with acute onset shortness of breath.  Patient states that she was on the medical floor when her brother passed away. After this, she became very tearful and had acute onset shortness of breath. She reports associated chest tightness. She has a history of asthma, but does not have any of her inhalers with her. She was given treatment in the waiting room, and reports improvement of symptoms. She denies fevers, chills, sore throat, cough, chest pain, current shortness of breath, nausea, vomiting, abdominal pain, urinary symptoms, abnormal bowel movements, or pain or swelling of her legs. She states her breathing was fine yesterday. She is unsure if she has her inhaler at home.     HPI  Past Medical History:  Diagnosis Date  . Asthma   . COPD (chronic obstructive pulmonary disease) ([place])   . Hx of colonic polyps   . Nausea & vomiting   . PSORIASIS 12/29/2006   Qualifier: Diagnosis of  By: Jobe Igo MD, Shanon Brow    . Smoker     Patient Active Problem List   Diagnosis Date Noted  . Asthma exacerbation in COPD ([place]) 10/16/2016  . 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 Loss of weight   . Dyspepsia   . Lumbar disc herniation   . Abnormal neurological exam 04/11/2016  . Leg pain, medial 04/11/2016  . Right leg pain 04/02/2016  . Orthopnea 09/28/2015  . De Quervain's tenosynovitis, left 11/09/2014  . Hypertensive retinopathy of right eye, grade 1 05/03/2014  . Hypertensive retinopathy of left eye, grade 2 05/03/2014  . Cataracts, bilateral 05/03/2014  . Headache(784.0) 04/29/2014  . Abdominal pain, unspecified site 08/15/2013  . Dyspnea 08/10/2013  . Cough 07/06/2013  . Protein-calorie malnutrition, severe ([place]) 05/11/2013  .  Abdominal pain, epigastric 02/23/2013  . Cyclical vomiting 81/82/9937  . Nausea & vomiting   . Smoker   . Chronic bronchitis ([place]) 12/29/2006  . COPD exacerbation ([place]) 12/29/2006  . GASTROESOPHAGEAL REFLUX DISEASE 12/29/2006  . HIATAL HERNIA WITH REFLUX 12/29/2006  . PSORIASIS 12/29/2006    Past Surgical History:  Procedure Laterality Date  . ABDOMINAL AORTOGRAM W/LOWER EXTREMITY N/A 10/06/2016   Procedure: Abdominal Aortogram w/Lower Extremity;  Surgeon: Waynetta Sandy, MD;  Location: [facility];  Service: Cardiovascular;  Laterality: N/A;  . APPENDECTOMY    . COLONOSCOPY W/ POLYPECTOMY  02/26/2005  . HERNIA REPAIR     x 2  . PARTIAL HYSTERECTOMY    . PERIPHERAL VASCULAR ATHERECTOMY Right 10/06/2016   Procedure: Peripheral Vascular Atherectomy;  Surgeon: Waynetta Sandy, MD;  Location: [facility];  Service: Cardiovascular;  Laterality: Right;  . PERIPHERAL VASCULAR INTERVENTION Right 10/06/2016   Procedure: Peripheral Vascular Intervention;  Surgeon: Waynetta Sandy, MD;  Location: [facility];  Service: Cardiovascular;  Laterality: Right;  SFA  . TONSILLECTOMY    . UPPER GASTROINTESTINAL ENDOSCOPY  03/22/2010, 02/13/2005    OB History    No data available       Home Medications    Prior to Admission medications   Medication Sig Start Date End Date Taking? Authorizing Provider  atorvastatin (LIPITOR) 80 MG tablet take 1 tablet by mouth once daily AT 6 PM 07/18/16  Smiley Houseman, MD  clopidogrel (PLAVIX) 75 MG tablet Take 1 tablet (75 mg total) by mouth daily. 10/06/16   Waynetta Sandy, MD  guaiFENesin (MUCINEX) 600 MG 12 hr tablet Take 600 mg by mouth 2 (two) times daily as needed for cough or to loosen phlegm.    [provider]  ipratropium (ATROVENT HFA) 17 MCG/ACT inhaler Inhale 2 puffs into the lungs every 6 (six) hours as needed for wheezing. 10/22/16   Eloise Levels, MD  pantoprazole (PROTONIX) 20  MG tablet Take 1 tablet (20 mg total) by mouth daily. 10/22/16   Eloise Levels, MD  predniSONE (DELTASONE) 10 MG tablet Take 4 tablets (40 mg total) by mouth daily. 06/16/17 06/20/17  Maday Guarino, PA-C  promethazine (PHENERGAN) 25 MG tablet take 1 tablet by mouth every 6 hours if needed for nausea and vomiting 03/09/17   Smiley Houseman, MD  RA ASPIRIN EC ADULT LOW ST 81 MG EC tablet take 1 tablet by mouth once daily 10/09/16   Smiley Houseman, MD  sucralfate (CARAFATE) 1 GM/10ML suspension Take 10 mLs (1 g total) by mouth 4 (four) times daily -  with meals and at bedtime. 10/22/16   Eloise Levels, MD  traMADol (ULTRAM) 50 MG tablet Take 1 tablet (50 mg total) by mouth every 6 (six) hours as needed for moderate pain. 10/22/16   Rogue Bussing, MD  VENTOLIN HFA 108 [phone] Base) MCG/ACT inhaler inhale 2 puffs by mouth every 4 hours if needed for wheezing shortness of breath 03/09/17   Smiley Houseman, MD    Family History Family History  Problem Relation Age of Onset  . Cancer Other   . Coronary artery disease Other   . Cancer Mother   . Hypertension Mother   . Heart disease Mother   . Cancer Father   . Hypertension Father   . Cancer Sister   . Hypertension Sister     Social History Social History  Substance Use Topics  . Smoking status: Former Smoker    Packs/day: 0.25    Types: Cigarettes    Quit date: 10/09/2016  . Smokeless tobacco: Never Used  . Alcohol use No     Comment: Quit alcohol in 1992     Allergies   Patient has no known allergies.   Review of Systems Review of Systems  Constitutional: Negative for chills and fever.  HENT: Negative for sore throat.   Respiratory: Positive for chest tightness and shortness of breath. Negative for cough.   Cardiovascular: Negative for chest pain, palpitations and leg swelling.  Gastrointestinal: Negative for abdominal pain, nausea and vomiting.     Physical Exam Updated Vital Signs BP (!) 145/87    Pulse 98   Temp 98.3 F (36.8 C) (Oral)   Resp 16   Wt 71.2 kg (157 lb)   SpO2 99%   BMI 25.34 kg/m   Physical Exam  Constitutional: She is oriented to person, place, and time. She appears well-developed and well-nourished. No distress.  HENT:  Head: Normocephalic and atraumatic.  Mouth/Throat: Uvula is midline, oropharynx is clear and moist and mucous membranes are normal.  Eyes: EOM are normal.  Neck: Normal range of motion.  Cardiovascular: Normal rate, regular rhythm and intact distal pulses.   Pulmonary/Chest: Effort normal and breath sounds normal. No respiratory distress. She has no decreased breath sounds. She has no wheezes. She has no rhonchi. She has no rales. She exhibits no tenderness.  Patient speaking  in full sentences without difficulty. Good air movement in all lung fields.  Abdominal: Soft. Bowel sounds are normal. She exhibits no distension. There is no tenderness.  Musculoskeletal: Normal range of motion. She exhibits no edema.  No pain or swelling of the legs  Lymphadenopathy:    She has no cervical adenopathy.  Neurological: She is alert and oriented to person, place, and time.  Skin: Skin is warm and dry.  Psychiatric: She has a normal mood and affect.  Nursing note and vitals reviewed.    ED Treatments / Results  Labs (all labs ordered are listed, but only abnormal results are displayed) Labs Reviewed  BASIC METABOLIC PANEL - Abnormal; Notable for the following:       Result Value   Glucose, Bld 124 (*)    All other components within normal limits  CBC WITH DIFFERENTIAL/PLATELET - Abnormal; Notable for the following:    Hemoglobin 11.9 (*)    All other components within normal limits    EKG  EKG Interpretation  Date/Time:  Tuesday June 16 2017 20:38:19 EDT Ventricular Rate:  93 PR Interval:    QRS Duration: 82 QT Interval:  355 QTC Calculation: 442 R Axis:   60 Text Interpretation:  Sinus rhythm RSR' in V1 or V2, right VCD or RVH  Confirmed by Virgel Manifold [phone]) on 06/17/2017 9:15:48 AM       Radiology Dg Chest 2 View  Result Date: 06/16/2017 CLINICAL DATA:  Wheezing.  Asthma. EXAM: CHEST  2 VIEW COMPARISON:  CT 10/20/2016. Most recent chest radiograph 10/19/2016. FINDINGS: Lateral view degraded by patient arm position. Minimal pectus excavatum deformity. Mild hyperinflation. Midline trachea. Borderline cardiomegaly. Mediastinal contours otherwise within normal limits. No pleural effusion or pneumothorax. Clear lungs. IMPRESSION: Hyperinflation, without acute disease. Electronically Signed   By: Abigail Miyamoto M.D.   On: 06/16/2017 17:33    Procedures Procedures (including critical care time)  Medications Ordered in ED Medications  ipratropium-albuterol (DUONEB) 0.5-2.5 (3) MG/3ML nebulizer solution 3 mL (3 mLs Nebulization Given 06/16/17 1638)  albuterol (PROVENTIL HFA;VENTOLIN HFA) 108 (90 Base) MCG/ACT inhaler 1 puff (1 puff Inhalation Given 06/16/17 2030)  predniSONE (DELTASONE) tablet 60 mg (60 mg Oral Given 06/16/17 2030)     Initial Impression / Assessment and Plan / ED Course  I have reviewed the triage vital signs and the nursing notes.  Pertinent labs & imaging results that were available during my care of the patient were reviewed by me and considered in my medical decision making (see chart for details).     Patient presented with acute onset shortness of breath after her brother passed away. Symptoms resolved with treatment in the waiting room. Physical examination shows patient speaking in full sentences without difficulty. Sats reassuring on room air. Lung exam with good air movement. Basic lab work and chest x-ray reassuring, doubt pneumonia, other lung infection, or new oxygen dependence. Will discharge patient with inhaler for home use and prednisone rx.  RN reports that upon discharge, patient is very tearful and crying. Is reporting occasional chest pain/epigastric pain, which is  reproducible. Likely due to emotional state. Will obtain EKG to r/o cardiac cause. Case discussed with attending, and Dr. Alvino Chapel agrees to plan.   EKG reassuring. At this time, pt appears safe for discharge. Sxs likely due to grief. Pt to f/u with PCP as needed. Strict return precautions given. Pt states she understands and agrees to plan.  Final Clinical Impressions(s) / ED Diagnoses   Final diagnoses:  Shortness  of breath    New Prescriptions Discharge Medication List as of 06/16/2017  8:25 PM    START taking these medications   Details  predniSONE (DELTASONE) 10 MG tablet Take 4 tablets (40 mg total) by mouth daily., Starting Tue 06/16/2017, Until Sat 06/20/2017, Print         [place], [place], PA-C 06/17/17 1200    Davonna Belling, MD 06/24/17 [phone]

## 2017-07-14 ENCOUNTER — Other Ambulatory Visit: Payer: Self-pay | Admitting: Internal Medicine

## 2017-08-27 ENCOUNTER — Other Ambulatory Visit: Payer: Self-pay | Admitting: Internal Medicine

## 2017-08-28 NOTE — Telephone Encounter (Signed)
Patient informed and scheduled appt for 09/14/17. Jazmin Hartsell,CMA

## 2017-08-28 NOTE — Telephone Encounter (Signed)
Please see if patient is able to make an appointment for physical. She has a few health maintenance things that are due.

## 2017-09-13 DIAGNOSIS — J449 Chronic obstructive pulmonary disease, unspecified: Secondary | ICD-10-CM | POA: Insufficient documentation

## 2017-09-13 NOTE — Progress Notes (Signed)
[facility] Phone: [phone]   Date of Visit: 09/14/2017   HPI:  Patient presents today for a well woman exam.   Concerns today:   Left Leg Pain:  - reports of left leg pain for 3 weeks -Pain is in her inner thigh and distally -Reports pain is similar to the pain she had earlier on which led her to have a procedure done by vascular surgery.  She thought she had the same symptoms on the left leg however per chart review, her symptoms were on her right lower extremity and she was found to have a lack of arterial flow to the right lower extremity.  At that admission vascular surgery recommended smoking cessation starting aspirin and statin medication and follow-up on an outpatient basis.  Per chart review on February 2018, she had a peripheral vascular catheterization which showed occlusion of the right SFA.  She had a stent placed of the proximal right SFA as well as a arthrectomy of the right SFA with jetstream.  She had not had a follow-up with vascular surgery after this procedure in February.  -She has not been taking atorvastatin or Plavix.  She does not recall these medications and thinks she has not been on these medications in the past.  Of note Plavix was started by vascular surgery.  Periods: Hysterectomy Contraception: Hysterectomy Pelvic symptoms: Denies vaginal bleeding, vaginal discharge, vaginal itching Sexual activity: not sexually active STD Screening: Patient requests STD screening Pap smear status: Last Pap in 11/2009 with LSIL Exercise: Reports she walks 20 minutes daily Diet: Reports she eats well-balanced meals Smoking: 3 cigs/day, marijuana once or twice a week.  She is interested in tobacco cessation.  She has tried Chantix in the past without much help.  She has cut down on the number of cigarettes she smokes per day on her own.  She reports that she has nicotine patches at home but has not tried this.  She does not remember what dose of  nicotine each patch has. Alcohol: Denies Drugs: Reports of marijuana use once or twice a week.  Denies cocaine or other recreational drug use Mood: PHQ 2 positive.  PHQ 9 score 10, somewhat difficult.  Question 9 is negative.  No SI or HI Dentist: last seen in September  Eyes: 2 years ago.  Reports that her former eye doctor does not accept Medicaid or Medicare.  ROS: Review of Systems  Constitutional: Negative for chills and fever.  Respiratory: Negative for shortness of breath.   Gastrointestinal: Positive for abdominal pain, nausea and vomiting.       GI symptoms are chronic  Musculoskeletal: Positive for joint pain and myalgias.  Neurological: Positive for headaches.  Psychiatric/Behavioral:       Stress    PMFSH:   PMH: Occlusion of Common Femoral Artery  Hiatal Hernia/GERD  Cyclical Vomiting Psoriasis Lumbar Disc Herniation Protein Calorie Malnutrition Hypertensive Retinopathy of the Eyes Bilateral Cataracts  PHYSICAL EXAM: BP 118/63   Pulse 91   Temp 98.8 F (37.1 C) (Oral)   Wt 156 lb (70.8 kg)   SpO2 98%   BMI 25.18 kg/m  Gen: NAD, pleasant, cooperative HEENT: NCAT, PERRL, no palpable thyromegaly or anterior cervical lymphadenopathy Heart: RRR, no murmurs Lungs: CTAB, NWOB Abdomen: soft, nontender to palpation Lower extremities: Unable to palpate dorsalis pedis pulse or posterior tibial pulse on the left lower extremity.  But able to palpate dorsalis pedis and posterior tibial pulses on the right lower extremity.  Unable  to palpate popliteal artery bilaterally.  Femoral pulses intact bilaterally.  Her capillary refill is normal in her lower extremities bilaterally.  Her extremities are warm to touch equally bilaterally.  She has some pain to palpation in the left medial thigh. Neuro: grossly nonfocal, speech normal GU: normal appearing external genitalia without lesions. Vagina is moist with white discharge.  Unable to see cervix on exam.  Unable to palpate  cervix on bimanual exam. No adnexal masses.   ASSESSMENT/PLAN:  # Health maintenance:  -STD screening: Screen for HIV, RPR, urine GC/chlamydia -pap smear: Unable to complete Pap smear because I was unable to visualize the cervix on exam and unable to palpate cervix on bimanual exam.  We double checked her chart and she had a partial hysterectomy in the past.  Will refer to gynecology for Pap smear screening..  -mammogram: Ordered -lipid screening: Will do at next visit -immunizations: Declines shingles vaccine -Hep C screening today  Left leg pain: I am concerned that this is due to her peripheral artery disease.  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  She has not been taking her Plavix or Lipitor as recommended by vascular surgery.  I recommended that she restart her Plavix and Lipitor and continue her aspirin 81 mg daily.  Urgent referral made to vascular surgery.  Nursing is working on getting her visit this week.  Provided tramadol as needed #4 pills.  Provided strict ED precautions if symptoms worsen in any way.  Follow-up in 1 week to address other concerns.  Smiley Houseman, MD PGY [place]

## 2017-09-14 ENCOUNTER — Other Ambulatory Visit (HOSPITAL_COMMUNITY)
Admission: RE | Admit: 2017-09-14 | Discharge: 2017-09-14 | Disposition: A | Discharge status: Home / Self Care | Payer: Medicare HMO | Source: Ambulatory Visit | Attending: Family Medicine | Admitting: Family Medicine

## 2017-09-14 ENCOUNTER — Encounter: Payer: Self-pay | Admitting: Internal Medicine

## 2017-09-14 ENCOUNTER — Telehealth: Payer: Self-pay | Admitting: Vascular Surgery

## 2017-09-14 ENCOUNTER — Other Ambulatory Visit: Payer: Self-pay

## 2017-09-14 ENCOUNTER — Ambulatory Visit (INDEPENDENT_AMBULATORY_CARE_PROVIDER_SITE_OTHER): Payer: Medicare HMO | Admitting: Internal Medicine

## 2017-09-14 VITALS — BP 118/63 | HR 91 | Temp 98.8°F | Wt 156.0 lb

## 2017-09-14 DIAGNOSIS — I739 Peripheral vascular disease, unspecified: Secondary | ICD-10-CM | POA: Diagnosis not present

## 2017-09-14 DIAGNOSIS — Z7251 High risk heterosexual behavior: Secondary | ICD-10-CM | POA: Insufficient documentation

## 2017-09-14 DIAGNOSIS — Z1159 Encounter for screening for other viral diseases: Secondary | ICD-10-CM | POA: Diagnosis not present

## 2017-09-14 DIAGNOSIS — Z124 Encounter for screening for malignant neoplasm of cervix: Secondary | ICD-10-CM | POA: Diagnosis not present

## 2017-09-14 DIAGNOSIS — Z1231 Encounter for screening mammogram for malignant neoplasm of breast: Secondary | ICD-10-CM | POA: Diagnosis not present

## 2017-09-14 DIAGNOSIS — M79605 Pain in left leg: Secondary | ICD-10-CM | POA: Diagnosis not present

## 2017-09-14 DIAGNOSIS — Z0000 Encounter for general adult medical examination without abnormal findings: Secondary | ICD-10-CM

## 2017-09-14 DIAGNOSIS — Z1239 Encounter for other screening for malignant neoplasm of breast: Secondary | ICD-10-CM

## 2017-09-14 MED ORDER — ATORVASTATIN CALCIUM 80 MG PO TABS: ORAL_TABLET | ORAL | 1 refills | Status: DC

## 2017-09-14 MED ORDER — TRAMADOL HCL 50 MG PO TABS: 50.0000 mg | ORAL_TABLET | Freq: Two times a day (BID) | ORAL | 0 refills | Status: DC | PRN

## 2017-09-14 MED ORDER — CLOPIDOGREL BISULFATE 75 MG PO TABS: 75.0000 mg | ORAL_TABLET | Freq: Every day | ORAL | 3 refills | Status: DC

## 2017-09-14 NOTE — Telephone Encounter (Signed)
[name] from PCP Dr. Dallas Schimke lm to sch appt for urgent referral to [facility] for PAD. PT is established w/ [facility] for PAD. No showed their appt s/p their angiogram 11/14/16. PCP wants an appt this week for the pt. Currently [facility] is off for two weeks and his next available is not until 10/09/17. The NP does not have anything this week as well.  Lm for [name] at [phone] to call back to discuss scheduling.

## 2017-09-14 NOTE — Patient Instructions (Addendum)
Let me know what nicotine patch dose you have at home. I want to make sure you are on the appropriate dose.   Please follow up in 1 week to discuss other concerns  FOR YOUR LEFT LEG PAIN, I AM WORRIED THAT YOU MAY NOT BE GETTING ENOUGH BLOOD CIRCULATION TO YOUR LEG WHICH IS LIKELY CAUSING YOUR SYMPTOMS. I MADE A URGENT REFERRAL TO THEN VASCULAR SURGEON. I PRESCRIBED A FEW PILLS OF TRAMADOL TO TAKE AS NEEDED. BUT IF YOUR SYMPTOMS WORSEN YOU NEED TO GO TO THE ED.   PLEASE START TAKING LIPITOR, ASPIRIN AND PLAVIX AGAIN. THIS IS IMPORTANT FOR THE CIRCULATION.

## 2017-09-15 ENCOUNTER — Inpatient Hospital Stay (HOSPITAL_COMMUNITY)
Admission: EM | Admit: 2017-09-15 | Discharge: 2017-09-17 | DRG: 192 | Disposition: A | Discharge status: Home / Self Care | Payer: Medicare HMO | Attending: Family Medicine | Admitting: Family Medicine

## 2017-09-15 ENCOUNTER — Emergency Department (HOSPITAL_COMMUNITY): Payer: Medicare HMO

## 2017-09-15 ENCOUNTER — Other Ambulatory Visit: Payer: Self-pay

## 2017-09-15 ENCOUNTER — Encounter (HOSPITAL_COMMUNITY): Payer: Self-pay | Admitting: Emergency Medicine

## 2017-09-15 ENCOUNTER — Observation Stay (HOSPITAL_COMMUNITY): Payer: Medicare HMO

## 2017-09-15 DIAGNOSIS — Z86010 Personal history of colonic polyps: Secondary | ICD-10-CM

## 2017-09-15 DIAGNOSIS — M79609 Pain in unspecified limb: Secondary | ICD-10-CM | POA: Diagnosis not present

## 2017-09-15 DIAGNOSIS — Z720 Tobacco use: Secondary | ICD-10-CM | POA: Diagnosis not present

## 2017-09-15 DIAGNOSIS — Z79899 Other long term (current) drug therapy: Secondary | ICD-10-CM | POA: Diagnosis not present

## 2017-09-15 DIAGNOSIS — J439 Emphysema, unspecified: Secondary | ICD-10-CM | POA: Diagnosis present

## 2017-09-15 DIAGNOSIS — K449 Diaphragmatic hernia without obstruction or gangrene: Secondary | ICD-10-CM | POA: Diagnosis present

## 2017-09-15 DIAGNOSIS — F1290 Cannabis use, unspecified, uncomplicated: Secondary | ICD-10-CM | POA: Diagnosis present

## 2017-09-15 DIAGNOSIS — I700 Atherosclerosis of aorta: Secondary | ICD-10-CM | POA: Diagnosis present

## 2017-09-15 DIAGNOSIS — R0602 Shortness of breath: Secondary | ICD-10-CM | POA: Diagnosis present

## 2017-09-15 DIAGNOSIS — Z8249 Family history of ischemic heart disease and other diseases of the circulatory system: Secondary | ICD-10-CM

## 2017-09-15 DIAGNOSIS — R0603 Acute respiratory distress: Secondary | ICD-10-CM

## 2017-09-15 DIAGNOSIS — M79605 Pain in left leg: Secondary | ICD-10-CM | POA: Diagnosis present

## 2017-09-15 DIAGNOSIS — Z7982 Long term (current) use of aspirin: Secondary | ICD-10-CM

## 2017-09-15 DIAGNOSIS — M79672 Pain in left foot: Secondary | ICD-10-CM | POA: Insufficient documentation

## 2017-09-15 DIAGNOSIS — Z95828 Presence of other vascular implants and grafts: Secondary | ICD-10-CM

## 2017-09-15 DIAGNOSIS — K219 Gastro-esophageal reflux disease without esophagitis: Secondary | ICD-10-CM | POA: Diagnosis not present

## 2017-09-15 DIAGNOSIS — I10 Essential (primary) hypertension: Secondary | ICD-10-CM | POA: Diagnosis present

## 2017-09-15 DIAGNOSIS — J441 Chronic obstructive pulmonary disease with (acute) exacerbation: Principal | ICD-10-CM | POA: Diagnosis present

## 2017-09-15 DIAGNOSIS — Z87891 Personal history of nicotine dependence: Secondary | ICD-10-CM | POA: Diagnosis not present

## 2017-09-15 DIAGNOSIS — R071 Chest pain on breathing: Secondary | ICD-10-CM | POA: Diagnosis not present

## 2017-09-15 DIAGNOSIS — I4581 Long QT syndrome: Secondary | ICD-10-CM | POA: Diagnosis not present

## 2017-09-15 DIAGNOSIS — G43A0 Cyclical vomiting, not intractable: Secondary | ICD-10-CM | POA: Diagnosis not present

## 2017-09-15 DIAGNOSIS — R112 Nausea with vomiting, unspecified: Secondary | ICD-10-CM | POA: Diagnosis not present

## 2017-09-15 DIAGNOSIS — R000 Tachycardia, unspecified: Secondary | ICD-10-CM | POA: Diagnosis not present

## 2017-09-15 DIAGNOSIS — J449 Chronic obstructive pulmonary disease, unspecified: Secondary | ICD-10-CM | POA: Diagnosis present

## 2017-09-15 DIAGNOSIS — Z7902 Long term (current) use of antithrombotics/antiplatelets: Secondary | ICD-10-CM

## 2017-09-15 DIAGNOSIS — R072 Precordial pain: Secondary | ICD-10-CM | POA: Diagnosis present

## 2017-09-15 DIAGNOSIS — R0600 Dyspnea, unspecified: Secondary | ICD-10-CM | POA: Diagnosis present

## 2017-09-15 DIAGNOSIS — R1013 Epigastric pain: Secondary | ICD-10-CM | POA: Diagnosis present

## 2017-09-15 DIAGNOSIS — I739 Peripheral vascular disease, unspecified: Secondary | ICD-10-CM | POA: Diagnosis not present

## 2017-09-15 DIAGNOSIS — J432 Centrilobular emphysema: Secondary | ICD-10-CM | POA: Diagnosis not present

## 2017-09-15 DIAGNOSIS — R079 Chest pain, unspecified: Secondary | ICD-10-CM | POA: Diagnosis present

## 2017-09-15 DIAGNOSIS — I70209 Unspecified atherosclerosis of native arteries of extremities, unspecified extremity: Secondary | ICD-10-CM | POA: Diagnosis present

## 2017-09-15 HISTORY — DX: Epigastric pain: R10.13

## 2017-09-15 HISTORY — DX: Radial styloid tenosynovitis (de quervain): M65.4

## 2017-09-15 HISTORY — DX: Headache, unspecified: R51.9

## 2017-09-15 HISTORY — DX: Unspecified chronic bronchitis: J42

## 2017-09-15 HISTORY — DX: Unspecified abdominal pain: R10.9

## 2017-09-15 HISTORY — DX: Unspecified symptoms and signs involving the nervous system: R29.90

## 2017-09-15 HISTORY — DX: Gastro-esophageal reflux disease without esophagitis: K21.9

## 2017-09-15 HISTORY — DX: Headache: R51

## 2017-09-15 HISTORY — DX: Unspecified cataract: H26.9

## 2017-09-15 HISTORY — DX: Pain in right leg: M79.604

## 2017-09-15 LAB — CBC WITH DIFFERENTIAL/PLATELET
Basophils Absolute: 0 10*3/uL (ref 0.0–0.1)
Basophils Relative: 0 %
EOS ABS: 0 10*3/uL (ref 0.0–0.7)
Eosinophils Relative: 0 %
HEMATOCRIT: 41.2 % (ref 36.0–46.0)
HEMOGLOBIN: 13.6 g/dL (ref 12.0–15.0)
LYMPHS ABS: 1.2 10*3/uL (ref 0.7–4.0)
Lymphocytes Relative: 17 %
MCH: 30.2 pg (ref 26.0–34.0)
MCHC: 33 g/dL (ref 30.0–36.0)
MCV: 91.4 fL (ref 78.0–100.0)
MONOS PCT: 2 %
Monocytes Absolute: 0.2 10*3/uL (ref 0.1–1.0)
NEUTROS ABS: 5.9 10*3/uL (ref 1.7–7.7)
NEUTROS PCT: 81 %
Platelets: 280 10*3/uL (ref 150–400)
RBC: 4.51 MIL/uL (ref 3.87–5.11)
RDW: 14 % (ref 11.5–15.5)
WBC: 7.4 10*3/uL (ref 4.0–10.5)

## 2017-09-15 LAB — BASIC METABOLIC PANEL
Anion gap: 16 — ABNORMAL HIGH (ref 5–15)
BUN: 6 mg/dL (ref 6–20)
CHLORIDE: 102 mmol/L (ref 101–111)
CO2: 20 mmol/L — ABNORMAL LOW (ref 22–32)
Calcium: 10 mg/dL (ref 8.9–10.3)
Creatinine, Ser: 0.92 mg/dL (ref 0.44–1.00)
GFR calc Af Amer: >60 mL/min (ref >60)
GFR calc non Af Amer: >60 mL/min (ref >60)
GLUCOSE: 153 mg/dL — AB (ref 65–99)
POTASSIUM: 3.7 mmol/L (ref 3.5–5.1)
Sodium: 138 mmol/L (ref 135–145)

## 2017-09-15 LAB — TROPONIN I: Troponin I: <0.03 ng/mL (ref <0.03)

## 2017-09-15 LAB — CREATININE, SERUM
Creatinine, Ser: 1.1 mg/dL — ABNORMAL HIGH (ref 0.44–1.00)
GFR calc non Af Amer: 52 mL/min — ABNORMAL LOW (ref >60)

## 2017-09-15 LAB — HIV ANTIBODY (ROUTINE TESTING W REFLEX): HIV SCREEN 4TH GENERATION: NONREACTIVE

## 2017-09-15 LAB — CBC
HEMATOCRIT: 37.9 % (ref 36.0–46.0)
Hemoglobin: 12.2 g/dL (ref 12.0–15.0)
MCH: 29.4 pg (ref 26.0–34.0)
MCHC: 32.2 g/dL (ref 30.0–36.0)
MCV: 91.3 fL (ref 78.0–100.0)
Platelets: 341 10*3/uL (ref 150–400)
RBC: 4.15 MIL/uL (ref 3.87–5.11)
RDW: 13.8 % (ref 11.5–15.5)
WBC: 11.5 10*3/uL — AB (ref 4.0–10.5)

## 2017-09-15 LAB — URINE CYTOLOGY ANCILLARY ONLY
CHLAMYDIA, DNA PROBE: NEGATIVE
Neisseria Gonorrhea: NEGATIVE
Trichomonas: NEGATIVE

## 2017-09-15 LAB — LIPID PANEL
Cholesterol: 185 mg/dL (ref 0–200)
HDL: 44 mg/dL (ref >40)
LDL CALC: 127 mg/dL — AB (ref 0–99)
TRIGLYCERIDES: 72 mg/dL (ref <150)
Total CHOL/HDL Ratio: 4.2 RATIO
VLDL: 14 mg/dL (ref 0–40)

## 2017-09-15 LAB — TSH: TSH: 0.329 u[IU]/mL — AB (ref 0.350–4.500)

## 2017-09-15 LAB — D-DIMER, QUANTITATIVE: D-Dimer, Quant: 0.76 ug/mL-FEU — ABNORMAL HIGH (ref 0.00–0.50)

## 2017-09-15 LAB — HEPATITIS C ANTIBODY

## 2017-09-15 LAB — HEMOGLOBIN A1C
HEMOGLOBIN A1C: 6 % — AB (ref 4.8–5.6)
Mean Plasma Glucose: 125.5 mg/dL

## 2017-09-15 LAB — INFLUENZA PANEL BY PCR (TYPE A & B)
Influenza A By PCR: NEGATIVE
Influenza B By PCR: NEGATIVE

## 2017-09-15 LAB — RPR: RPR Ser Ql: NONREACTIVE

## 2017-09-15 MED ORDER — PREDNISONE 20 MG PO TABS
60.0000 mg | ORAL_TABLET | Freq: Once | ORAL | Status: DC
  Filled 2017-09-15: qty 3

## 2017-09-15 MED ORDER — TIOTROPIUM BROMIDE MONOHYDRATE 18 MCG IN CAPS
18.0000 ug | ORAL_CAPSULE | Freq: Every day | RESPIRATORY_TRACT | Status: DC
  Administered 2017-09-16: 18 ug via RESPIRATORY_TRACT
  Filled 2017-09-15 (×2): qty 5

## 2017-09-15 MED ORDER — PANTOPRAZOLE SODIUM 40 MG IV SOLR
40.0000 mg | INTRAVENOUS | Status: DC
  Administered 2017-09-15 – 2017-09-16 (×2): 40 mg via INTRAVENOUS
  Filled 2017-09-15 (×2): qty 40

## 2017-09-15 MED ORDER — ALBUTEROL (5 MG/ML) CONTINUOUS INHALATION SOLN
15.0000 mg/h | INHALATION_SOLUTION | Freq: Once | RESPIRATORY_TRACT | Status: AC
  Administered 2017-09-15: 15 mg/h via RESPIRATORY_TRACT
  Filled 2017-09-15: qty 20

## 2017-09-15 MED ORDER — ONDANSETRON HCL 4 MG/2ML IJ SOLN
4.0000 mg | Freq: Four times a day (QID) | INTRAMUSCULAR | Status: DC | PRN
  Administered 2017-09-15 – 2017-09-16 (×3): 4 mg via INTRAVENOUS
  Filled 2017-09-15 (×3): qty 2

## 2017-09-15 MED ORDER — PROMETHAZINE HCL 25 MG PO TABS: 12.5000 mg | ORAL_TABLET | Freq: Four times a day (QID) | ORAL | Status: DC | PRN

## 2017-09-15 MED ORDER — MORPHINE SULFATE (PF) 4 MG/ML IV SOLN
2.0000 mg | INTRAVENOUS | Status: AC
  Administered 2017-09-15: 2 mg via INTRAVENOUS
  Filled 2017-09-15: qty 1

## 2017-09-15 MED ORDER — ACETAMINOPHEN 325 MG PO TABS: 650.0000 mg | ORAL_TABLET | Freq: Four times a day (QID) | ORAL | Status: DC | PRN

## 2017-09-15 MED ORDER — ENOXAPARIN SODIUM 40 MG/0.4ML ~~LOC~~ SOLN
40.0000 mg | SUBCUTANEOUS | Status: DC
  Administered 2017-09-15 – 2017-09-16 (×2): 40 mg via SUBCUTANEOUS
  Filled 2017-09-15 (×2): qty 0.4

## 2017-09-15 MED ORDER — FAMOTIDINE 20 MG PO TABS
20.0000 mg | ORAL_TABLET | Freq: Once | ORAL | Status: AC
  Administered 2017-09-15: 20 mg via ORAL
  Filled 2017-09-15: qty 1

## 2017-09-15 MED ORDER — CLOPIDOGREL BISULFATE 75 MG PO TABS
75.0000 mg | ORAL_TABLET | Freq: Every day | ORAL | Status: DC
  Administered 2017-09-16 – 2017-09-17 (×2): 75 mg via ORAL
  Filled 2017-09-15 (×2): qty 1

## 2017-09-15 MED ORDER — ASPIRIN EC 81 MG PO TBEC
81.0000 mg | DELAYED_RELEASE_TABLET | Freq: Every day | ORAL | Status: DC
  Administered 2017-09-16 – 2017-09-17 (×2): 81 mg via ORAL
  Filled 2017-09-15 (×2): qty 1

## 2017-09-15 MED ORDER — PANTOPRAZOLE SODIUM 20 MG PO TBEC: 20.0000 mg | DELAYED_RELEASE_TABLET | Freq: Every day | ORAL | Status: DC

## 2017-09-15 MED ORDER — SUCRALFATE 1 GM/10ML PO SUSP
1.0000 g | Freq: Three times a day (TID) | ORAL | Status: DC
  Administered 2017-09-15 – 2017-09-17 (×6): 1 g via ORAL
  Filled 2017-09-15 (×6): qty 10

## 2017-09-15 MED ORDER — LEVALBUTEROL HCL 0.63 MG/3ML IN NEBU
0.6300 mg | INHALATION_SOLUTION | RESPIRATORY_TRACT | Status: DC | PRN
  Administered 2017-09-16: 0.63 mg via RESPIRATORY_TRACT
  Filled 2017-09-15: qty 3

## 2017-09-15 MED ORDER — PREDNISONE 50 MG PO TABS
50.0000 mg | ORAL_TABLET | Freq: Every day | ORAL | Status: DC
  Administered 2017-09-16 – 2017-09-17 (×2): 50 mg via ORAL
  Filled 2017-09-15 (×2): qty 1

## 2017-09-15 MED ORDER — DM-GUAIFENESIN ER 30-600 MG PO TB12
1.0000 | ORAL_TABLET | Freq: Two times a day (BID) | ORAL | Status: DC
  Administered 2017-09-15 – 2017-09-17 (×4): 1 via ORAL
  Filled 2017-09-15 (×4): qty 1

## 2017-09-15 MED ORDER — METHYLPREDNISOLONE SODIUM SUCC 125 MG IJ SOLR
125.0000 mg | Freq: Once | INTRAMUSCULAR | Status: AC
  Administered 2017-09-15: 125 mg via INTRAVENOUS
  Filled 2017-09-15: qty 2

## 2017-09-15 MED ORDER — MORPHINE SULFATE (PF) 4 MG/ML IV SOLN
2.0000 mg | INTRAVENOUS | Status: DC | PRN
  Administered 2017-09-15 – 2017-09-17 (×4): 2 mg via INTRAVENOUS
  Filled 2017-09-15 (×4): qty 1

## 2017-09-15 MED ORDER — ACETAMINOPHEN 650 MG RE SUPP: 650.0000 mg | Freq: Four times a day (QID) | RECTAL | Status: DC | PRN

## 2017-09-15 MED ORDER — ATORVASTATIN CALCIUM 80 MG PO TABS
80.0000 mg | ORAL_TABLET | Freq: Every day | ORAL | Status: DC
  Administered 2017-09-16: 80 mg via ORAL
  Filled 2017-09-15: qty 1

## 2017-09-15 MED ORDER — POLYETHYLENE GLYCOL 3350 17 G PO PACK: 17.0000 g | PACK | Freq: Every day | ORAL | Status: DC | PRN

## 2017-09-15 MED ORDER — ALBUTEROL (5 MG/ML) CONTINUOUS INHALATION SOLN
10.0000 mg/h | INHALATION_SOLUTION | Freq: Once | RESPIRATORY_TRACT | Status: AC
  Administered 2017-09-15: 10 mg/h via RESPIRATORY_TRACT

## 2017-09-15 MED ORDER — ENSURE ENLIVE PO LIQD: 237.0000 mL | Freq: Two times a day (BID) | ORAL | Status: DC

## 2017-09-15 MED ORDER — ONDANSETRON HCL 4 MG/2ML IJ SOLN
4.0000 mg | Freq: Once | INTRAMUSCULAR | Status: AC
Start: 2017-09-15 — End: 2017-09-15
  Administered 2017-09-15: 4 mg via INTRAVENOUS
  Filled 2017-09-15: qty 2

## 2017-09-15 MED ORDER — IOPAMIDOL (ISOVUE-370) INJECTION 76%
INTRAVENOUS | Status: AC
  Administered 2017-09-15: 100 mL
  Filled 2017-09-15: qty 100

## 2017-09-15 NOTE — Progress Notes (Signed)
Patient admitted to the floor via stretcher from the ED. Telemetry applied. Continuous pulse OX applied.  Orders reviewed. Patient comfortable in bed. Vital signs completed. Will monitor.  Sheliah Plane RN

## 2017-09-15 NOTE — H&P (Signed)
[facility] Admission History and Physical Service Pager: [phone]  Patient name: [name] Medical record number: [id number] Date of birth: 10/24/1950 Age: 67 y.o. Gender: female  Primary Care Provider: Smiley Houseman, MD Consultants: None Code Status: Full  Chief Complaint: Shortness of breath  Assessment and Plan: [name] is a 67 y.o. female presenting with dyspnea. PMH is significant for Asthma/COPD, PAD with stint placement, and GERD.  Dyspnea  Presentes for dyspnea that began yesterday.  She has had increased cough and increased sputum production.  Her sats have been 96-100% while in the ED and she has received CAT.  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 Dyspnea is most likely due to a COPD exacerbation, however, a PE is also possible.  She has had unilateral leg pain that is not typical for a DVT, but we cannot rule it out. Her Wells score is 1.5, indicating low likelihood of PE.  - admit to med-surg, attending Dr. McDiarmid - continuous pulse ox - solumedrol today  - Start prednisone tomorrow - Start Doxycycline - start levalbuterol - promethazine for nausea - influenza panel - incentive spirometry  Chest Pain: Patient has had some substernal chest pain over the last day.  It is worsened with cough, so likely MSK or pleuritic, but could be ACS - trend troponins - EKG - cardiac monitoring - Lipid panel - TSH  Leg Pain: Leg pain is likely due to PAD.  She has severe PAD in her right leg that necessasitated a stent in 2018.  Leg pain is likely due to this, but could also be due to a DVT - continue home atorvastatin and plavix - D-dimer - acetaminophen for pain  Asthma/COPD: Listed as having combination of asthma and COPD in EMR. Do not see any PFTs recorded in past.  - would recommend PFTs as outpatient  - continue spirivia  PAD: Has significant PAD  necessitating a stent in 2018. She was not taking atorvastatin or Plavix, but it was restarted yesterday in clinic.   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  Blood pressures have been elevated at previous office visits and admission, and has been elevated in the ED today. - continue to monitor  - consider starting antihypertensive medication if they remain elevated    GERD: Patient has known hiatal hernia. She takes sucralfate at home.  - continue home sucralfate - start pantroprazole  Tobacco Use: Smokes 3-5 cigarettes daily for 40 years.  - recommend smoking cessation - Nicotine patch upon request  FEN/GI: Regular diet Prophylaxis: lovenox  Disposition: admit to med/surg  History of Present Illness:  [name] is a 67 y.o. female presenting with dyspnea. Reports that she has been SOB since yesterday. Came on gradually and worsened overnight. Filled up 3 bags coughing up sputum. Also endorses chest pain as well. Feels like it is squeezing pain sub-sternally. Chest pain worsens with coughing.Denies hemoptysis. Mentions being nauseous since yesterday and having trouble with taking her medications. Has post tussive emesis. Pain in her left leg since December, was seen at [facility] yesterday for this complaint. No sick contacts or recent travel.   Review Of Systems: Per HPI with the following additions:   Constitutional: Negative for chills and fever.  Respiratory: Positive for cough, sputum production and shortness of  breath. Negative for hemoptysis.   Gastrointestinal: Negative for abdominal pain, constipation and diarrhea.  Genitourinary: Negative for dysuria, frequency and urgency.   Patient Active Problem List   Diagnosis Date Noted  . Asthma-COPD overlap syndrome ([place]) 09/13/2017  . 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 Loss of weight    . Dyspepsia   . Lumbar disc herniation   . Abnormal neurological exam 04/11/2016  . Leg pain, medial 04/11/2016  . Right leg pain 04/02/2016  . Orthopnea 09/28/2015  . De Quervain's tenosynovitis, left 11/09/2014  . Hypertensive retinopathy of right eye, grade 1 05/03/2014  . Hypertensive retinopathy of left eye, grade 2 05/03/2014  . Cataracts, bilateral 05/03/2014  . Headache(784.0) 04/29/2014  . Abdominal pain, unspecified site 08/15/2013  . Dyspnea 08/10/2013  . Protein-calorie malnutrition, severe ([place]) 05/11/2013  . Abdominal pain, epigastric 02/23/2013  . Cyclical vomiting 25/42/7062  . Nausea & vomiting   . Smoker   . GASTROESOPHAGEAL REFLUX DISEASE 12/29/2006  . HIATAL HERNIA WITH REFLUX 12/29/2006  . PSORIASIS 12/29/2006    Past Medical History: Past Medical History:  Diagnosis Date  . Asthma   . COPD (chronic obstructive pulmonary disease) ([place])   . Hx of colonic polyps   . Nausea & vomiting   . PSORIASIS 12/29/2006   Qualifier: Diagnosis of  By: Jobe Igo MD, Shanon Brow    . Smoker     Past Surgical History: Past Surgical History:  Procedure Laterality Date  . ABDOMINAL AORTOGRAM W/LOWER EXTREMITY N/A 10/06/2016   Procedure: Abdominal Aortogram w/Lower Extremity;  Surgeon: Waynetta Sandy, MD;  Location: [place] [facility];  Service: Cardiovascular;  Laterality: N/A;  . APPENDECTOMY    . COLONOSCOPY W/ POLYPECTOMY  02/26/2005  . HERNIA REPAIR     x 2  . PARTIAL HYSTERECTOMY    . PERIPHERAL VASCULAR ATHERECTOMY Right 10/06/2016   Procedure: Peripheral Vascular Atherectomy;  Surgeon: Waynetta Sandy, MD;  Location: [place] [facility];  Service: Cardiovascular;  Laterality: Right;  . PERIPHERAL VASCULAR INTERVENTION Right 10/06/2016   Procedure: Peripheral Vascular Intervention;  Surgeon: Waynetta Sandy, MD;  Location: [place] [facility];  Service: Cardiovascular;  Laterality: Right;  SFA  . TONSILLECTOMY    . UPPER GASTROINTESTINAL ENDOSCOPY   03/22/2010, 02/13/2005    Social History: Social History   Tobacco Use  . Smoking status: Former Smoker    Packs/day: 0.25    Types: Cigarettes    Last attempt to quit: 10/09/2016    Years since quitting: 0.9  . Smokeless tobacco: Never Used  Substance Use Topics  . Alcohol use: No    Alcohol/week: 0.0 oz    Comment: Quit alcohol in 1992  . Drug use: Yes    Types: Marijuana   Additional social history: Lives alone, but granddaughter lives close by.  Please also refer to relevant sections of EMR.  Family History: Family History  Problem Relation Age of Onset  . Cancer Other   . Coronary artery disease Other   . Cancer Mother   . Hypertension Mother   . Heart disease Mother   . Cancer Father   . Hypertension Father   . Cancer Sister   . Hypertension Sister     Allergies and Medications: No Known Allergies No current facility-administered medications on file prior to encounter.    Current Outpatient Medications on File Prior to Encounter  Medication Sig Dispense Refill  . atorvastatin (LIPITOR) 80 MG tablet take 1 tablet by mouth once daily AT  6 PM 30 tablet 1  . clopidogrel (PLAVIX) 75 MG tablet Take 1 tablet (75 mg total) by mouth daily. 30 tablet 3  . guaiFENesin-dextromethorphan (ROBITUSSIN DM) 100-10 MG/5ML syrup Take 5 mLs by mouth every 4 (four) hours as needed for cough.    Marland Kitchen ipratropium (ATROVENT HFA) 17 MCG/ACT inhaler Inhale 2 puffs into the lungs every 6 (six) hours as needed for wheezing. 1 Inhaler 12  . promethazine (PHENERGAN) 25 MG tablet take 1 tablet by mouth every 6 hours if needed for nausea and vomiting 30 tablet 2  . RA ASPIRIN EC ADULT LOW ST 81 MG EC tablet take 1 tablet by mouth once daily 30 tablet 0  . tiotropium (SPIRIVA) 18 MCG inhalation capsule Place 18 mcg into inhaler and inhale daily.    . traMADol (ULTRAM) 50 MG tablet Take 1 tablet (50 mg total) by mouth every 12 (twelve) hours as needed. (Patient taking differently: Take 50 mg by mouth  every 12 (twelve) hours as needed for moderate pain. ) 4 tablet 0  . VENTOLIN HFA 108 (90 Base) MCG/ACT inhaler inhale 2 puffs by mouth every 4 hours if needed for wheezing or shortness of breath 18 g 1  . pantoprazole (PROTONIX) 20 MG tablet Take 1 tablet (20 mg total) by mouth daily. (Patient not taking: Reported on 09/14/2017) 30 tablet 0  . sucralfate (CARAFATE) 1 GM/10ML suspension Take 10 mLs (1 g total) by mouth 4 (four) times daily -  with meals and at bedtime. (Patient not taking: Reported on 09/14/2017) 420 mL 0    Objective: BP 138/64   Pulse (!) 119   Resp 20   Wt 156 lb (70.8 kg)   SpO2 96%   BMI 25.18 kg/m  Exam: General: Alert, sitting up in bed with nebulizer Cardiovascular: Normal S1 and S2, no murmurs, rubs or gallops Respiratory: diffuse wheezing bilaterally Gastrointestinal: Normal active bowel sounds MSK: Tenderness to palpation in left upper inner thigh, but no edema  Labs and Imaging: CBC BMET  Recent Labs  Lab 09/15/17 1021  WBC 7.4  HGB 13.6  HCT 41.2  PLT 280   Recent Labs  Lab 09/15/17 1021  NA 138  K 3.7  CL 102  CO2 20*  BUN 6  CREATININE 0.92  GLUCOSE 153*  CALCIUM 10.0      Landry Dyke, Medical Student 09/15/2017, 4:24 PM  [place] Intern pager: [phone], text pages welcome  RESIDENT ADDENDUM  I have separately seen and examined the patient. I have discussed the findings and exam with the medical student and agree with the above note, which I have edited appropriately. I helped develop the management plan that is described in the student's note, and I agree with the content.  Additionally I have outlined my exam and assessment/plan below:   67 year old female with PMH of asthma/COPD, PAD with stent placement in RLE, HTN who presents with dyspnea and chest pain x<24h.   PE:  Gen: alert, sitting up in bed, NAD Eyes: EOMI. Conjunctiva normal.  Neck: Normal ROM.  CV: tachycardic, regular rhythm. No murmurs  appreciated. Difficult to palpate pedal pulses. Negative Homan's sign bilaterally.  Lungs: Diffuse end expiratory wheezes with reduced air movement.  Abdomen: +BS, soft, NTND  MSK: TTP in left upper, medial thigh without edema or masses appreciated. No LE edema. Increased warmth to LE bilaterally.  Skin: Warm and dry.  Neuro: No focal deficits. Alert and oriented.  Psych: Appropriate mood and affect.  A/P:  Dyspnea:  Received CAT in the ED. CXR negative. Stable on RA. Most concerning for COPD exacerbation given increased cough with sputum production and wheezing. However, with sudden onset, pleuritic sounding chest pain, and complaint of left lower extremity pain with reported edema there is concern for PE. Patient does not have any signs of DVT on exam. Is tachycardic but has been receiving albuterol. Doubt CAP due to lack of CXR findings or leukocytosis however temperature is not documented.  -admit to telemetry, vital signs per unit with continuous pulse ox  -give solumedrol x1, then start prednisone 50 mg  -doxycyline given prolonged Qtc  -continue home spiriva; would likely benefit from addition of LABA given recurrent COPD exacerbations  -xopenox prn due to tachycardia  -incentive spirometry  -obtain d-dimer, if elevated would obtain CTA chest  -monitor respiratory status  -flu panel  -PFTs as outpatient  Chest Pain: Suspect related to cough however patient has a HEART score of 4, so warrants ACS r/o. Initial troponin negative and EKG with RBBB seen on prior EKGs.  -trend troponins  -repeat EKG in AM -risk stratification labs  Leg Pain: Leg pain is likely due to PAD.  She has severe PAD in her right leg that necessasitated a stent in 2018. Lower suspicion for DVT given lack of edema, negative homan's sign and no pain to palpation of calf.  - continue home atorvastatin and plavix - D-dimer - acetaminophen for pain Asthma/COPD: With acute exacerbation. -see above for management   PAD: Patient with stent in right LE for occlusion of common femoral artery. Pedal pulses difficult to access. Was referred to vascular surgery by PCP yesterday.  -consider vascular surgery consult while here  -continue statin and plavix  HTN: Not currently on hypertensive medications. BPs have been elevated at previous office visits and admissions. BP elevated today.  -may need to initiate medical therapy  GERD: Stable.  -PPI  Tobacco Use: Smokes 3-5 cigarettes daily for 40 years.  - recommend smoking cessation - Nicotine patch upon request   Phill Myron, D.O. 09/15/2017, 5:37 PM PGY-3, [place]

## 2017-09-15 NOTE — ED Provider Notes (Signed)
[facility] EMERGENCY DEPARTMENT Provider Note   CSN: [id number] Arrival date & time: 09/15/17  1011     History   Chief Complaint Chief Complaint  Patient presents with  . Shortness of Breath    HPI [name] is a 67 y.o. female.  She presents for evaluation of shortness of breath and intermittent vomiting.  She has been coughing, producing sputum, wheezing and denies fevers or chills.  She smokes cigarettes.  She saw her PCP yesterday for a checkup, and was referred to vascular surgery to follow-up on peripheral vascular disease of the legs.    HPI  Past Medical History:  Diagnosis Date  . Asthma   . COPD (chronic obstructive pulmonary disease) ([place])   . Hx of colonic polyps   . Nausea & vomiting   . PSORIASIS 12/29/2006   Qualifier: Diagnosis of  By: Jobe Igo MD, Shanon Brow    . Smoker     Patient Active Problem List   Diagnosis Date Noted  . Asthma-COPD overlap syndrome ([place]) 09/13/2017  . 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 Loss of weight   . Dyspepsia   . Lumbar disc herniation   . Abnormal neurological exam 04/11/2016  . Leg pain, medial 04/11/2016  . Right leg pain 04/02/2016  . Orthopnea 09/28/2015  . De Quervain's tenosynovitis, left 11/09/2014  . Hypertensive retinopathy of right eye, grade 1 05/03/2014  . Hypertensive retinopathy of left eye, grade 2 05/03/2014  . Cataracts, bilateral 05/03/2014  . Headache(784.0) 04/29/2014  . Abdominal pain, unspecified site 08/15/2013  . Dyspnea 08/10/2013  . Protein-calorie malnutrition, severe ([place]) 05/11/2013  . Abdominal pain, epigastric 02/23/2013  . Cyclical vomiting 16/38/4536  . Nausea & vomiting   . Smoker   . GASTROESOPHAGEAL REFLUX DISEASE 12/29/2006  . HIATAL HERNIA WITH REFLUX 12/29/2006  . PSORIASIS 12/29/2006    Past Surgical History:  Procedure Laterality Date  . ABDOMINAL AORTOGRAM W/LOWER EXTREMITY N/A 10/06/2016   Procedure: Abdominal Aortogram w/Lower Extremity;   Surgeon: Waynetta Sandy, MD;  Location: [place] [facility];  Service: Cardiovascular;  Laterality: N/A;  . APPENDECTOMY    . COLONOSCOPY W/ POLYPECTOMY  02/26/2005  . HERNIA REPAIR     x 2  . PARTIAL HYSTERECTOMY    . PERIPHERAL VASCULAR ATHERECTOMY Right 10/06/2016   Procedure: Peripheral Vascular Atherectomy;  Surgeon: Waynetta Sandy, MD;  Location: [place] [facility];  Service: Cardiovascular;  Laterality: Right;  . PERIPHERAL VASCULAR INTERVENTION Right 10/06/2016   Procedure: Peripheral Vascular Intervention;  Surgeon: Waynetta Sandy, MD;  Location: [place] [facility];  Service: Cardiovascular;  Laterality: Right;  SFA  . TONSILLECTOMY    . UPPER GASTROINTESTINAL ENDOSCOPY  03/22/2010, 02/13/2005    OB History    No data available       Home Medications    Prior to Admission medications   Medication Sig Start Date End Date Taking? Authorizing Provider  atorvastatin (LIPITOR) 80 MG tablet take 1 tablet by mouth once daily AT 6 PM 09/14/17   Smiley Houseman, MD  clopidogrel (PLAVIX) 75 MG tablet Take 1 tablet (75 mg total) by mouth daily. 09/14/17   Smiley Houseman, MD  ipratropium (ATROVENT HFA) 17 MCG/ACT inhaler Inhale 2 puffs into the lungs every 6 (six) hours as needed for wheezing. 10/22/16   Eloise Levels, MD  pantoprazole (PROTONIX) 20 MG tablet Take 1 tablet (20 mg total) by mouth daily. Patient not taking: Reported on 09/14/2017 10/22/16  Eloise Levels, MD  promethazine (PHENERGAN) 25 MG tablet take 1 tablet by mouth every 6 hours if needed for nausea and vomiting 08/28/17   Smiley Houseman, MD  RA ASPIRIN EC ADULT LOW ST 81 MG EC tablet take 1 tablet by mouth once daily 10/09/16   Smiley Houseman, MD  sucralfate (CARAFATE) 1 GM/10ML suspension Take 10 mLs (1 g total) by mouth 4 (four) times daily -  with meals and at bedtime. Patient not taking: Reported on 09/14/2017 10/22/16   Eloise Levels, MD  tiotropium [facility]) 18 MCG  inhalation capsule Place 18 mcg into inhaler and inhale daily.    [provider]  traMADol (ULTRAM) 50 MG tablet Take 1 tablet (50 mg total) by mouth every 12 (twelve) hours as needed. 09/14/17   Smiley Houseman, MD  VENTOLIN HFA 108 [phone] Base) MCG/ACT inhaler inhale 2 puffs by mouth every 4 hours if needed for wheezing or shortness of breath 07/14/17   Smiley Houseman, MD    Family History Family History  Problem Relation Age of Onset  . Cancer Other   . Coronary artery disease Other   . Cancer Mother   . Hypertension Mother   . Heart disease Mother   . Cancer Father   . Hypertension Father   . Cancer Sister   . Hypertension Sister     Social History Social History   Tobacco Use  . Smoking status: Former Smoker    Packs/day: 0.25    Types: Cigarettes    Last attempt to quit: 10/09/2016    Years since quitting: 0.9  . Smokeless tobacco: Never Used  Substance Use Topics  . Alcohol use: No    Alcohol/week: 0.0 oz    Comment: Quit alcohol in 1992  . Drug use: Yes    Types: Marijuana     Allergies   Patient has no known allergies.   Review of Systems Review of Systems  All other systems reviewed and are negative.    Physical Exam Updated Vital Signs BP (!) 178/103   Pulse (!) 115   Resp (!) 21   Wt 70.8 kg (156 lb)   SpO2 100%   BMI 25.18 kg/m   Physical Exam  Constitutional: She is oriented to person, place, and time. She appears well-developed and well-nourished.  HENT:  Head: Normocephalic and atraumatic.  Eyes: Conjunctivae and EOM are normal. Pupils are equal, round, and reactive to light.  Neck: Normal range of motion and phonation normal. Neck supple.  Cardiovascular: Normal rate and regular rhythm.  Pulmonary/Chest: Effort normal and breath sounds normal. She exhibits no tenderness.  Abdominal: Soft. She exhibits no distension. There is no tenderness. There is no guarding.  Musculoskeletal: Normal range of motion.  Neurological:  She is alert and oriented to person, place, and time. She exhibits normal muscle tone.  Skin: Skin is warm and dry.  Psychiatric: She has a normal mood and affect. Her behavior is normal. Judgment and thought content normal.  Nursing note and vitals reviewed.    ED Treatments / Results  Labs (all labs ordered are listed, but only abnormal results are displayed) Labs Reviewed  BASIC METABOLIC PANEL - Abnormal; Notable for the following components:      Result Value   CO2 20 (*)    Glucose, Bld 153 (*)    Anion gap 16 (*)    All other components within normal limits  CBC WITH DIFFERENTIAL/PLATELET  TROPONIN I  EKG  EKG Interpretation  Date/Time:  Tuesday September 15 2017 10:21:20 EST Ventricular Rate:  115 PR Interval:    QRS Duration: 85 QT Interval:  346 QTC Calculation: 479 R Axis:   65 Text Interpretation:  Sinus tachycardia Atrial premature complex RSR' in V1 or V2, right VCD or RVH Since last tracing rate faster Confirmed by Daleen Bo [phone]) on 09/15/2017 10:29:21 AM       Radiology Dg Chest Port 1 View  Result Date: 09/15/2017 CLINICAL DATA:  Shortness of breath and mid chest pain. EXAM: PORTABLE CHEST 1 VIEW COMPARISON:  06/16/2017 FINDINGS: Lungs are clear. Heart and mediastinum are within normal limits. Atherosclerotic calcifications at the aortic arch. Trachea is midline. Negative for a pneumothorax. Stable sclerotic densities in the proximal left humerus. IMPRESSION: No acute cardiopulmonary disease. Electronically Signed   By: Markus Daft M.D.   On: 09/15/2017 10:52    Procedures .Critical Care Performed by: Daleen Bo, MD Authorized by: Daleen Bo, MD   Critical care provider statement:    Critical care time (minutes):  35   Critical care start time:  09/15/2017 10:20 AM   Critical care end time:  09/15/2017 3:26 PM   Critical care time was exclusive of:  Separately billable procedures and treating other patients   Critical care was necessary  to treat or prevent imminent or life-threatening deterioration of the following conditions:  Respiratory failure   Critical care was time spent personally by me on the following activities:  Blood draw for specimens, development of treatment plan with patient or surrogate, discussions with consultants, obtaining history from patient or surrogate and examination of patient    (including critical care time)  Medications Ordered in ED Medications  predniSONE (DELTASONE) tablet 60 mg (not administered)  albuterol (PROVENTIL,VENTOLIN) solution continuous neb (15 mg/hr Nebulization Given 09/15/17 1052)  ondansetron (ZOFRAN) injection 4 mg (4 mg Intravenous Given 09/15/17 1058)     Initial Impression / Assessment and Plan / ED Course  I have reviewed the triage vital signs and the nursing notes.  Pertinent labs & imaging results that were available during my care of the patient were reviewed by me and considered in my medical decision making (see chart for details).  Clinical Course as of Sep 16 1439  Tue Sep 15, 2017  1022 Noisy audible respirations, with mild increased work of breathing, characterized by tachypnea, and poor air movement bilaterally, with end expiratory wheezing.  Nebulizer and prednisone ordered.  [EW]    Clinical Course User Index [EW] Daleen Bo, MD     Patient Vitals for the past 24 hrs:  BP Pulse Resp SpO2 Weight  09/15/17 1430 138/64 (!) 119 20 96 % -  09/15/17 1415 (!) 144/76 (!) 112 20 97 % -  09/15/17 1400 139/63 (!) 116 19 96 % -  09/15/17 1145 - - - 100 % -  09/15/17 1047 - - - - 70.8 kg (156 lb)  09/15/17 1038 - - - 100 % -  09/15/17 1019 (!) 178/103 (!) 115 (!) 21 100 % -    3:25 PM Reevaluation with update and discussion. After initial assessment and treatment, an updated evaluation reveals he continues to be uncomfortable with increased work of breathing and is tachycardic.  Borderline tachypnea.  Findings discussed with patient and her family  member, all questions answered. Daleen Bo   3:30 PM-Consult complete with family medicine resident. Patient case explained and discussed.  She agrees to admit patient for further evaluation and treatment.  Call ended at 3:43 PM  Final Clinical Impressions(s) / ED Diagnoses   Final diagnoses:  None    Ongoing smoking, with COPD exacerbation.  Peripheral vascular disease, not being treated properly, but not apparently critical at this time.  Patient persistently uncomfortable, with tachycardia and tachypnea and wheezing after initial treatment.  Repeat nebulizer ordered, and admission arranged.  No indication for advanced airway support at this time.  Nursing Notes Reviewed/ Care Coordinated Applicable Imaging Reviewed Interpretation of Laboratory Data incorporated into ED treatment  Plan: [place]  ED Discharge Orders    None       Daleen Bo, MD 09/15/17 [phone]

## 2017-09-15 NOTE — ED Notes (Signed)
Attempted to ambulate pt in hall, but when pt sat up in bed she felt too weak and said she could not stand.

## 2017-09-15 NOTE — ED Triage Notes (Addendum)
Pt in from home via [facility] with c/o sob since yesterday. Per EMS, pt has been vomitting on and off x 3 days. Denies fevers, bilateral wheezes present, sats 100% on mask. Given 5 of Albuterol and 0.5 Atrovent en route. Hx of DVT's

## 2017-09-16 ENCOUNTER — Encounter: Payer: Self-pay | Admitting: Internal Medicine

## 2017-09-16 ENCOUNTER — Other Ambulatory Visit: Payer: Self-pay | Admitting: Internal Medicine

## 2017-09-16 ENCOUNTER — Inpatient Hospital Stay (HOSPITAL_COMMUNITY): Payer: Medicare HMO

## 2017-09-16 ENCOUNTER — Encounter (HOSPITAL_COMMUNITY): Payer: Self-pay | Admitting: Family Medicine

## 2017-09-16 DIAGNOSIS — R0603 Acute respiratory distress: Secondary | ICD-10-CM | POA: Diagnosis not present

## 2017-09-16 DIAGNOSIS — Z8249 Family history of ischemic heart disease and other diseases of the circulatory system: Secondary | ICD-10-CM | POA: Diagnosis not present

## 2017-09-16 DIAGNOSIS — Z124 Encounter for screening for malignant neoplasm of cervix: Secondary | ICD-10-CM

## 2017-09-16 DIAGNOSIS — M79605 Pain in left leg: Secondary | ICD-10-CM | POA: Diagnosis present

## 2017-09-16 DIAGNOSIS — F1290 Cannabis use, unspecified, uncomplicated: Secondary | ICD-10-CM | POA: Diagnosis present

## 2017-09-16 DIAGNOSIS — Z95828 Presence of other vascular implants and grafts: Secondary | ICD-10-CM | POA: Diagnosis not present

## 2017-09-16 DIAGNOSIS — J432 Centrilobular emphysema: Secondary | ICD-10-CM | POA: Diagnosis not present

## 2017-09-16 DIAGNOSIS — I10 Essential (primary) hypertension: Secondary | ICD-10-CM | POA: Diagnosis present

## 2017-09-16 DIAGNOSIS — Z87891 Personal history of nicotine dependence: Secondary | ICD-10-CM | POA: Diagnosis not present

## 2017-09-16 DIAGNOSIS — R0602 Shortness of breath: Secondary | ICD-10-CM | POA: Diagnosis present

## 2017-09-16 DIAGNOSIS — K449 Diaphragmatic hernia without obstruction or gangrene: Secondary | ICD-10-CM | POA: Diagnosis present

## 2017-09-16 DIAGNOSIS — M79609 Pain in unspecified limb: Secondary | ICD-10-CM | POA: Diagnosis not present

## 2017-09-16 DIAGNOSIS — J449 Chronic obstructive pulmonary disease, unspecified: Secondary | ICD-10-CM | POA: Diagnosis not present

## 2017-09-16 DIAGNOSIS — J441 Chronic obstructive pulmonary disease with (acute) exacerbation: Secondary | ICD-10-CM | POA: Diagnosis present

## 2017-09-16 DIAGNOSIS — I739 Peripheral vascular disease, unspecified: Secondary | ICD-10-CM | POA: Diagnosis not present

## 2017-09-16 DIAGNOSIS — R072 Precordial pain: Secondary | ICD-10-CM | POA: Diagnosis present

## 2017-09-16 DIAGNOSIS — Z7982 Long term (current) use of aspirin: Secondary | ICD-10-CM | POA: Diagnosis not present

## 2017-09-16 DIAGNOSIS — Z7902 Long term (current) use of antithrombotics/antiplatelets: Secondary | ICD-10-CM | POA: Diagnosis not present

## 2017-09-16 DIAGNOSIS — R000 Tachycardia, unspecified: Secondary | ICD-10-CM | POA: Diagnosis present

## 2017-09-16 DIAGNOSIS — Z79899 Other long term (current) drug therapy: Secondary | ICD-10-CM | POA: Diagnosis not present

## 2017-09-16 DIAGNOSIS — I70209 Unspecified atherosclerosis of native arteries of extremities, unspecified extremity: Secondary | ICD-10-CM | POA: Diagnosis present

## 2017-09-16 DIAGNOSIS — K219 Gastro-esophageal reflux disease without esophagitis: Secondary | ICD-10-CM | POA: Diagnosis present

## 2017-09-16 DIAGNOSIS — J439 Emphysema, unspecified: Secondary | ICD-10-CM | POA: Diagnosis present

## 2017-09-16 DIAGNOSIS — I700 Atherosclerosis of aorta: Secondary | ICD-10-CM | POA: Diagnosis present

## 2017-09-16 DIAGNOSIS — I4581 Long QT syndrome: Secondary | ICD-10-CM | POA: Diagnosis present

## 2017-09-16 DIAGNOSIS — G43A0 Cyclical vomiting, not intractable: Secondary | ICD-10-CM | POA: Diagnosis present

## 2017-09-16 DIAGNOSIS — Z86010 Personal history of colonic polyps: Secondary | ICD-10-CM | POA: Diagnosis not present

## 2017-09-16 LAB — CBC
HEMATOCRIT: 34.4 % — AB (ref 36.0–46.0)
Hemoglobin: 11.3 g/dL — ABNORMAL LOW (ref 12.0–15.0)
MCH: 29.7 pg (ref 26.0–34.0)
MCHC: 32.8 g/dL (ref 30.0–36.0)
MCV: 90.5 fL (ref 78.0–100.0)
Platelets: 322 10*3/uL (ref 150–400)
RBC: 3.8 MIL/uL — AB (ref 3.87–5.11)
RDW: 14 % (ref 11.5–15.5)
WBC: 12.5 10*3/uL — AB (ref 4.0–10.5)

## 2017-09-16 LAB — BASIC METABOLIC PANEL
ANION GAP: 13 (ref 5–15)
BUN: 12 mg/dL (ref 6–20)
CHLORIDE: 104 mmol/L (ref 101–111)
CO2: 21 mmol/L — ABNORMAL LOW (ref 22–32)
Calcium: 9.1 mg/dL (ref 8.9–10.3)
Creatinine, Ser: 0.94 mg/dL (ref 0.44–1.00)
GFR calc non Af Amer: >60 mL/min (ref >60)
Glucose, Bld: 153 mg/dL — ABNORMAL HIGH (ref 65–99)
POTASSIUM: 3.5 mmol/L (ref 3.5–5.1)
SODIUM: 138 mmol/L (ref 135–145)

## 2017-09-16 LAB — TROPONIN I
Troponin I: <0.03 ng/mL (ref <0.03)
Troponin I: <0.03 ng/mL (ref <0.03)

## 2017-09-16 LAB — T4, FREE: Free T4: 1.07 ng/dL (ref 0.61–1.12)

## 2017-09-16 MED ORDER — IPRATROPIUM-ALBUTEROL 0.5-2.5 (3) MG/3ML IN SOLN
3.0000 mL | RESPIRATORY_TRACT | Status: DC
  Administered 2017-09-16: 3 mL via RESPIRATORY_TRACT
  Filled 2017-09-16 (×2): qty 3

## 2017-09-16 MED ORDER — IPRATROPIUM-ALBUTEROL 0.5-2.5 (3) MG/3ML IN SOLN
3.0000 mL | Freq: Three times a day (TID) | RESPIRATORY_TRACT | Status: DC
  Administered 2017-09-16 – 2017-09-17 (×2): 3 mL via RESPIRATORY_TRACT
  Filled 2017-09-16 (×2): qty 3

## 2017-09-16 MED ORDER — DOXYCYCLINE HYCLATE 100 MG PO TABS
100.0000 mg | ORAL_TABLET | Freq: Two times a day (BID) | ORAL | Status: DC
  Administered 2017-09-16 – 2017-09-17 (×3): 100 mg via ORAL
  Filled 2017-09-16 (×3): qty 1

## 2017-09-16 MED ORDER — NICOTINE 7 MG/24HR TD PT24
7.0000 mg | MEDICATED_PATCH | Freq: Every day | TRANSDERMAL | Status: DC
  Administered 2017-09-16 – 2017-09-17 (×2): 7 mg via TRANSDERMAL
  Filled 2017-09-16 (×2): qty 1

## 2017-09-16 NOTE — Progress Notes (Signed)
Sent letter informing of normal labs

## 2017-09-16 NOTE — Care Management Note (Signed)
Case Management Note  Patient Details  Name: [name] MRN: [id number] Date of Birth: Mar 03, 1951  Subjective/Objective:                 Patient independent from home admitted with COPD flare. Currently RA.   PCP Dennie Fetters   Action/Plan:  CM will continue to follow for home needs.   Expected Discharge Date:                  Expected Discharge Plan:  Home/Self Care  In-House Referral:     Discharge planning Services  CM Consult  Post Acute Care Choice:    Choice offered to:     DME Arranged:    DME Agency:     HH Arranged:    HH Agency:     Status of Service:  In process, will continue to follow  If discussed at Long Length of Stay Meetings, dates discussed:    Additional Comments:  Carles Collet, RN 09/16/2017, 2:39 PM

## 2017-09-16 NOTE — Progress Notes (Signed)
   09/16/17 1100  Clinical Encounter Type  Visited With Patient  Visit Type Initial  Referral From Nurse  Consult/Referral To Chaplain  Spiritual Encounters  Spiritual Needs Prayer;Emotional  Stress Factors  Patient Stress Factors Exhausted  Family Stress Factors Exhausted    Patient was alone in her bed eating but struggling with her breath. Nurse came in and helped her with her breathing treatment. Patient was very appreciative of my visit. I provided emotional support and compassionate presence.  [name] a Medical sales representative, [employer]

## 2017-09-16 NOTE — Progress Notes (Signed)
Bilateral ABIs and TBIs completed. RT ABI/TBI 1.02/0.70 Lt 0.81/0.30, The posterior tibial ABI however appears to be 0.47. 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 There has been a decrease bilaterally since the study of 11/05/2016 which was rt ABI/TBI of 1.15/0.78 lt 1.07/0.57. [name],RVS 09/16/2017 1:38 PM

## 2017-09-16 NOTE — Progress Notes (Signed)
Family Medicine Teaching Service Daily Progress Note Intern Pager: [phone]  Patient name: [name] Medical record number: [id number] Date of birth: 1951/08/02 Age: 67 y.o. Gender: female  Primary Care Provider: Smiley Houseman, MD Consultants: None Code Status: Full  Pt Overview and Major Events to Date:  [name] is a 67 y.o. female presenting with dyspnea. PMH is significant for Asthma/COPD, PAD with stint placement, and GERD.  Assessment and Plan:  Dyspnea  Presented for dyspnea that began yesterday.  She has had increased cough and increased sputum production.  Her sats have been 96-100% while in the ED and she has received CAT.  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 CXR was normal. Dyspnea is most likely due to a COPD exacerbation. D-dimer was elevated, but CT angiogram was negative for PE. - continuous pulse ox - monitor respiratory status - continue prednisone 50 mg - continue Doxycycline (due to prolonged Qtc) - continue levalbuterol due to  - continue home spirivia - promethazine for nausea - incentive spirometry  Chest Pain: Patient has had some substernal chest pain over the last day.  It is worsened with cough, so likely MSK or pleuritic, less likely to be ACS. Elevated LDL. - Troponins negative x2 - EKG shows no signs of ischemic changes - cardiac monitoring  Leg Pain/PAD: Leg pain is likely due to PAD.  She has severe PAD in her right leg that necessasitated a stent in 2018.  Leg pain is likely due to this, but could also be due to a DVT.  D-dimer positive, but CT angiogram negative for PE.   - continue home atorvastatin and plavix - acetaminophen for pain - ABI today, consider consulting vascular if abnormal  Asthma/COPD: Listed as having combination of asthma and COPD in EMR. Do not see any PFTs recorded in past.  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  Blood pressures have been elevated at previous office visits and admission, and has been elevated in the ED today. - continue to monitor  - consider starting antihypertensive medication if they remain elevated    GERD: Patient has known hiatal hernia.She takes sucralfate at home.  - continue home sucralfate - start pantroprazole  Tobacco VPX:TGGYIR 3-5 cigarettes daily for 40 years.  - recommend smoking cessation - Nicotine patch upon request  FEN/GI: Regular diet Prophylaxis: lovenox  Disposition: home  Subjective:  Increased cough this morning and chest is hurting with the coughing. Needed 1 breathing treatment last night.  Objective: Temp:  [98 F (36.7 C)-99.5 F (37.5 C)] 98.6 F (37 C) (01/16 0504) Pulse Rate:  [99-135] 99 (01/16 0504) Resp:  [15-23] 19 (01/16 0504) BP: (124-178)/(59-103) 132/74 (01/16 0504) SpO2:  [94 %-100 %] 94 % (01/16 0504) Weight:  [155 lb 6.8 oz (70.5 kg)-156 lb (70.8 kg)] 155 lb 6.8 oz (70.5 kg) (01/15 2123) Physical Exam: General: Coughing heavily this morning with audible wheezing.  Cardiovascular: Normal S1 and S2, no murmurs, rubs or gallops Respiratory: Loud referred upper airway sounds, diffuse expiratory wheezes Abdomen: No tenderness to palpation, normal active bowel sounds Extremities: Tender to palpation in left inner, upper thigh  Laboratory: Recent Labs  Lab 09/15/17 1021 09/15/17 1732 09/16/17 0549  WBC 7.4 11.5* 12.5*  HGB 13.6 12.2 11.3*  HCT 41.2 37.9 34.4*  PLT  280 341 322   Recent Labs  Lab 09/15/17 1021 09/15/17 1732  NA 138  --   K 3.7  --   CL 102  --   CO2 20*  --   BUN 6  --   CREATININE 0.92 1.10*  CALCIUM 10.0  --   GLUCOSE 153*  --    D-dimer: 0.76 (elevated)  Imaging/Diagnostic Tests: CT-angiogram: No evidence of pulmonary embolism,   Landry Dyke,  Medical Student 09/16/2017, 7:38 AM [place] Intern pager: [phone], text pages welcome  I have personally seen and examined this patient with Landry Dyke and agree with the above note. The following is my additional documentation.   Physical Exam: General: thin elderly woman, NAD with non-toxic appearance HEENT: normocephalic, atraumatic, moist mucous membranes Neck: supple, non-tender without lymphadenopathy Cardiovascular: regular rate and rhythm without murmurs, rubs, or gallops Lungs: diffuse expiratory wheeze without rales or rhonci with normal work of breathing on room air Abdomen: soft, non-tender, non-distended, normoactive bowel sounds Skin: warm, dry, no rashes or lesions, cap refill < 2 seconds Extremities: warm and well perfused, normal tone, no edema  Assessment/Plan [name] is a 67 y.o. female presenting with COPD exacerbation. PMH is significant forAsthma/COPD, PAD with stent placement, and GERD.  Non-hypoxic respiratory distress: Acute.  C/w COPD exacerbation. Remains on room air without increased work of breathing.  Continues to have productive cough.  Currently receiving doxycycline and prednisone along with scheduled duo nebs and albuterol as needed. Noncardiac chest pain: Acute.  Seems to be related to cough, likely MSK or pleuritic.  ACS unlikely given negative troponins and reassuring EKG findings. PAD: Chronic.  Continues to have leg pain.  Does have history of stent placement in 2018 on right leg.  CTA negative for PE. GERD: Chronic.  Has known hiatal hernia.  Initiating pantoprazole. Tobacco use disorder: Current everyday smoker, 3-5 cigarettes daily for 45 years.  Interested in cessation on discharge.  Receiving nicotine patch daily.  Disposition: Appears to be doing stable on room air.  Continues to have some posttussive emesis and diffuse wheeze.  Will monitor throughout the day with DuoNeb treatments with anticipated discharge in  the next 24 hours.   Harriet Butte, [place], PGY-2

## 2017-09-16 NOTE — Progress Notes (Signed)
Initial Nutrition Assessment  DOCUMENTATION CODES:   Not applicable  INTERVENTION:   Discontinue Ensure Enlive po BID  Provide snacks TID  NUTRITION DIAGNOSIS:   Inadequate oral intake related to poor appetite as evidenced by per patient/family report.  GOAL:   Patient will meet greater than or equal to 90% of their needs  MONITOR:   PO intake, Weight trends, I & O's  REASON FOR ASSESSMENT:   Malnutrition Screening Tool    ASSESSMENT:   Pt with PMH of COPD, GERD, and PAD presents with dyspnea  Discussed pt with RN, reports pt consumed majority of breakfast tray. Meal completion records show pt consumed 100% of snack yesterday and breakfast today.   Pt reports a decreased appetite for the past 3 days stating she has only consumed 1/2 of a cheeseburger. Pt said prior to this she would consume 1 large meal, occasional snacks, and two 16 oz bottles of Coke daily. This amount of soda provides almost 400 kcal. Pt cares for many family members at home with multiple deaths to immediate family members in the past year.    Pt reports weight loss 3 years ago stating a past UBW of 175 lb, with a recent increase. Per chart, pt's weight has remained fairly stable over past couple years.   Pt reports disliking Ensure supplement, RD to order snacks. RD promoted PO intake and increased protein consumption.   Labs reviewed; Hemoglobin A1C 6.0 Medications reviewed; Protonix, Prednisone, Carafate  NUTRITION - FOCUSED PHYSICAL EXAM:    Most Recent Value  Orbital Region  No depletion  Upper Arm Region  Mild depletion  Thoracic and Lumbar Region  Unable to assess  Buccal Region  No depletion  Temple Region  Mild depletion  Clavicle Bone Region  Moderate depletion  Clavicle and Acromion Bone Region  Moderate depletion  Scapular Bone Region  Unable to assess  Dorsal Hand  Unable to assess  Patellar Region  Mild depletion  Anterior Thigh Region  Mild depletion  Posterior Calf Region   Moderate depletion  Edema (RD Assessment)  None  Hair  Other (Comment) [thinning]  Eyes  Reviewed  Mouth  Reviewed  Skin  Reviewed  Nails  Other (Comment) [frail]      Diet Order:  Diet Heart Room service appropriate? Yes; Fluid consistency: Thin  EDUCATION NEEDS:   Education needs have been addressed  Skin:  Skin Assessment: Reviewed RN Assessment  Last BM:  09/14/17  Height:   Ht Readings from Last 1 Encounters:  09/16/17 5\' 6"  (1.676 m)    Weight:   Wt Readings from Last 1 Encounters:  09/15/17 155 lb 6.8 oz (70.5 kg)    Ideal Body Weight:  59 kg  BMI:  Body mass index is 25.09 kg/m.  Estimated Nutritional Needs:   Kcal:  1610-9604  Protein:  85-95 grams  Fluid:  >/= 1.7 L/d  Parks Ranger, MS, RDN, LDN 09/16/2017 2:38 PM

## 2017-09-17 ENCOUNTER — Telehealth: Payer: Self-pay | Admitting: *Deleted

## 2017-09-17 DIAGNOSIS — I739 Peripheral vascular disease, unspecified: Secondary | ICD-10-CM | POA: Diagnosis present

## 2017-09-17 DIAGNOSIS — J449 Chronic obstructive pulmonary disease, unspecified: Secondary | ICD-10-CM

## 2017-09-17 DIAGNOSIS — J432 Centrilobular emphysema: Secondary | ICD-10-CM

## 2017-09-17 MED ORDER — PREDNISONE 50 MG PO TABS: ORAL_TABLET | ORAL | 0 refills | Status: AC

## 2017-09-17 MED ORDER — FLUTICASONE-UMECLIDIN-VILANT 100-62.5-25 MCG/INH IN AEPB: 1.0000 | INHALATION_SPRAY | Freq: Every day | RESPIRATORY_TRACT | 0 refills | Status: DC

## 2017-09-17 MED ORDER — DOXYCYCLINE HYCLATE 100 MG PO TABS: 100.0000 mg | ORAL_TABLET | Freq: Two times a day (BID) | ORAL | 0 refills | Status: DC

## 2017-09-17 MED ORDER — NICOTINE 14 MG/24HR TD PT24: 14.0000 mg | MEDICATED_PATCH | Freq: Every day | TRANSDERMAL | 0 refills | Status: DC

## 2017-09-17 NOTE — Telephone Encounter (Signed)
Received message on nurse line from [place], Case Manager with [facility] stating patient would be good candidate for referral to [facility] Case Management for COPD. If appropriate please place order in Epic. HFU sched with PCP on 09/23/2017. Hubbard Hartshorn, RN, BSN

## 2017-09-17 NOTE — Consult Note (Signed)
            [facility] CM Primary Care Navigator  09/17/2017  [name] 1951-08-02 [id number]   Went to see patient in the room to identify possible discharge needs but she was already discharged per RN report. Patient was discharged home today.  Primary care provider's office called (Dr. Luci Bank) to notify of patient's discharge and need for post hospital follow-up and transition of care. Notified of patient's health issues needing follow-up (mainly COPD exacerbation, tobacco cessation).   Made aware to refer patient to [facility] care management if deemed necessary and appropriate for any services.  Patient has discharge instruction tofollow-up with primary care provider post hospitalization on 09/23/17 at 2:30 pm.   For additional questions please contact:  Edwena Felty A. Jordany Russett, BSN, RN-BC [facility] PRIMARY CARE Navigator Cell: [phone]

## 2017-09-17 NOTE — Progress Notes (Signed)
Family Medicine Teaching Service Daily Progress Note Intern Pager: [phone]  Patient name: [name] Medical record number: [id number] Date of birth: September 11, 1950 Age: 67 y.o. Gender: female  Primary Care Provider: Smiley Houseman, MD Consultants: None Code Status: Full  Pt Overview and Major Events to Date:  Kathleen Stimpsonis a 67 y.o.femalepresenting with dyspnea. PMH is significant forAsthma/COPD, PAD with stint placement, and GERD.  Assessment and Plan:  Dyspnea  Presented for dyspnea that began 1/14. She has had increased cough and increased sputum production. Her sats have been 96-100% while in the ED and she has received CAT. 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 CXR was normal. Dyspnea is most likely due to a COPD exacerbation. D-dimer was elevated, but CT angiogram was negative for PE. - continuous pulse ox - monitor respiratory status - continue prednisone 50 mg (day 3 of 5) - continue Doxycycline (due to prolonged Qtc) - continue levalbuterol due to tachycardia - continue home spirivia - promethazine for nausea - incentive spirometry  Chest Pain:Patient has had some substernal chest pain over the last day. It is worsened with cough, so likely MSK or pleuritic, less likely to be ACS. Elevated LDL. - Troponins negative x2 - EKG shows no signs of ischemic changes - cardiac monitoring  Leg Pain/PAD:Leg pain is likely due to PAD. She has severe PAD in her right leg that necessasitated a stent in 2018. Leg pain is likely due to this, but could also be due to a DVT.  D-dimer positive, but CT angiogram negative for PE.  ABI on 1/16 on left was 0.47 - continue home atorvastatin and plavix - acetaminophen for pain - follow up with vascular  Asthma/COPD: Listed as having combination of asthma and COPD in EMR. Do not see any PFTs recorded in past.  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 Blood pressures have been elevated at previous office visits and admission, and has been elevated in the ED today. - continue to monitor  - consider starting antihypertensive medication if they remain elevated  GERD:Patient hasknown hiatal hernia.She takes sucralfate at home.  - continue home sucralfate - continue pantroprazole  Tobacco XFG:HWEXHB 3-5 cigarettes daily for 40 years.  - recommend smoking cessation - Nicotine patch upon request  FEN/GI: heart healthy diet Prophylaxis:lovenox  Disposition: home  Subjective:  Breathing is better this morning.  Has been having more nausea and vomiting.    Objective: Temp:  [98.2 F (36.8 C)-98.4 F (36.9 C)] 98.3 F (36.8 C) (01/17 0500) Pulse Rate:  [74-111] 90 (01/17 0500) Resp:  [18-22] 18 (01/17 0500) BP: (120-138)/(61-89) 120/69 (01/17 0500) SpO2:  [95 %-100 %] 97 % (01/17 0500) Weight:  [155 lb 6.8 oz (70.5 kg)] 155 lb 6.8 oz (70.5 kg) (01/16 2033) Physical Exam: General: Alert and active Cardiovascular: Normal S1 and S2, no murmurs rubs or gallops Respiratory: diffuse wheezing bilaterally, breathing much more easily.   Abdomen: Tenderness to palpation in epigastric region  Laboratory: Recent Labs  Lab 09/15/17 1021 09/15/17 1732 09/16/17 0549  WBC 7.4 11.5* 12.5*  HGB 13.6 12.2 11.3*  HCT 41.2 37.9 34.4*  PLT 280 341 322   Recent Labs  Lab 09/15/17 1021 09/15/17 1732 09/16/17 0549  NA 138  --  138  K 3.7  --  3.5  CL 102  --  104  CO2 20*  --  21*  BUN 6  --  12  CREATININE 0.92 1.10* 0.94  CALCIUM 10.0  --  9.1  GLUCOSE 153*  --  153*    Imaging/Diagnostic Tests: None  Landry Dyke, Medical Student 09/17/2017, 8:17 AM [place] Intern pager: [phone], text pages welcome   I  have personally seen and examined this patient with Landry Dyke and agree with the above note. The following is my additional documentation.   Physical Exam: General: thin elderly woman, NAD with non-toxic appearance HEENT: normocephalic, atraumatic, moist mucous membranes Neck: supple, non-tender without lymphadenopathy Cardiovascular: regular rate and rhythm without murmurs, rubs, or gallops Lungs: diffuse expiratory wheeze without rales or rhonci with normal work of breathing on room air Abdomen: soft, non-tender, non-distended, normoactive bowel sounds Skin: warm, dry, no rashes or lesions, cap refill < 2 seconds Extremities: warm and well perfused, normal tone, no edema  Assessment/Plan [name] is a 67 y.o. female presenting with COPD exacerbation. PMH is significant forAsthma/COPD, PAD with stent placement, and GERD.  Non-hypoxic respiratory distress: Acute.  C/w COPD exacerbation. Remains on room air without increased work of breathing.  Continues to have productive cough.  Currently receiving doxycycline and prednisone along with scheduled duo nebs and albuterol as needed. Noncardiac chest pain: Acute.  Seems to be related to cough, likely MSK or pleuritic.  ACS unlikely given negative troponins and reassuring EKG findings. PAD: Chronic.  Continues to have leg pain.  Does have history of stent placement in 2018 on right leg.  CTA negative for PE. Left posterior tibal artery ABI was only 0.47. Not acute limb ischemia but will need urgent follow up with [facility] after discharge.  GERD: Chronic.  Has known hiatal hernia.  Initiating pantoprazole. Tobacco use disorder: Current everyday smoker, 3-5 cigarettes daily for 45 years.  Interested in cessation on discharge.  Receiving nicotine patch daily.  Disposition: Stable on room air with comfortable WOB.  Continues to have some posttussive emesis (although cyclical vomiting likely contributing) and diffuse wheeze. Stable for discharge  today with continued outpatient treatment for COPD exacerbation.   Phill Myron, D.O. 09/17/2017, 1:59 PM PGY-3, [place]

## 2017-09-17 NOTE — Discharge Instructions (Signed)
You were admitted for a COPD exacerbation.  You have been taking Prednisone and doxycycline for this exacerbation.   - When you leave the hospital you will continue to take the predisone and doxycycline for 2 more days (through 1/19).  - We have switched your spirivia inhaler to a Trelegy inhaler.  You will STOP taking spirivia, and START Trelegy. - You should follow up with your PCP to discuss quitting smoking.  You will be given a nicotine to use until you follow up.   Chronic Obstructive Pulmonary Disease Exacerbation Chronic obstructive pulmonary disease (COPD) is a common lung problem. In COPD, the flow of air from the lungs is limited. COPD exacerbations are times that breathing gets worse and you need extra treatment. Without treatment they can be life threatening. If they happen often, your lungs can become more damaged. If your COPD gets worse, your doctor may treat you with:  Medicines.  Oxygen.  Different ways to clear your airway, such as using a mask.  Follow these instructions at home:  Do not smoke.  Avoid tobacco smoke and other things that bother your lungs.  If given, take your antibiotic medicine as told. Finish the medicine even if you start to feel better.  Only take medicines as told by your doctor.  Drink enough fluids to keep your pee (urine) clear or pale yellow (unless your doctor has told you not to).  Use a cool mist machine (vaporizer).  If you use oxygen or a machine that turns liquid medicine into a mist (nebulizer), continue to use them as told.  Keep up with shots (vaccinations) as told by your doctor.  Exercise regularly.  Eat healthy foods.  Keep all doctor visits as told. Get help right away if:  You are very short of breath and it gets worse.  You have trouble talking.  You have bad chest pain.  You have blood in your spit (sputum).  You have a fever.  You keep throwing up (vomiting).  You feel weak, or you pass out  (faint).  You feel confused.  You keep getting worse. This information is not intended to replace advice given to you by your health care provider. Make sure you discuss any questions you have with your health care provider. Document Released: 08/07/2011 Document Revised: 01/24/2016 Document Reviewed: 04/22/2013 Elsevier Interactive Patient Education  2017 [employer].

## 2017-09-17 NOTE — Discharge Summary (Signed)
[facility] Discharge Summary  Patient name: [name] Medical record number: [id number] Date of birth: Feb 16, 1951 Age: 67 y.o. Gender: female Date of Admission: 09/15/2017  Date of Discharge: 09/17/17 Admitting Physician: Blane Ohara McDiarmid, MD  Primary Care Provider: Smiley Houseman, MD Consultants: None  Indication for Hospitalization: COPD exacerbation  Discharge Diagnoses/Problem List:  COPD Cyclic vomiting  Disposition: Home  Discharge Condition: Stable  Discharge Exam:  General:  Alert and active Cardiovascular:  Normal S1 and S2, no murmurs rubs or gallops Respiratory:  Diffuse expiratory wheezing bilaterally, comfortable WOB on RA, productive cough  Abdomen:  Tenderness to palpation in epigastric region  Brief Hospital Course:  Dyspnea:  Presented for dyspnea, increased cough and increased sputum production.  Her sats remained in the 90s on RA throughout her hospitalization.  She received multiple breathing treatments.  She also had nausea and vomiting that was likely a combination of post-tussive and her cyclic vomiting.  She received a prednisone burst and doxycycline.  Discharged with prednisone, doxycycline, and Trelegy in place of Spirivia, in order to optimize therapy and try to avoid further exacerbations requiring hospitalization.   Chest Pain:  Presented with some substernal chest pain.  Troponins were negative x3 and ECG showed no signs of ischemic changes. Had negative CTA for PE. Suspect musculoskeletal related to intensity of cough.   Leg Pain/PAD:  Patient has been having left leg pain that is similar to pain she had in right necessitating a stent.  Left posterior tibial artery ABI was 0.47.  She will follow up with Vascular outpatient.  Tobacco use:  She is interested in quitting.  Will discharge with 14mg  patch to start nicotine taper.   Issues for Follow Up:  1. PFT to determine COPD vs. COPD/Asthma. No prior PFTs on file.   2. Tobacco cessation and nicotine taper with patches.  3. Follow up with vascular for PAD in left leg. No signs of acute limb ischemia during hospitalization. PCP had already previously placed urgent referral.  4. Patient to complete 5 day course of steroids ending 1/19 and 5 days of Doxycyline ending 1/20.   Significant Procedures: None  Significant Labs and Imaging:  Recent Labs  Lab 09/15/17 1021 09/15/17 1732 09/16/17 0549  WBC 7.4 11.5* 12.5*  HGB 13.6 12.2 11.3*  HCT 41.2 37.9 34.4*  PLT 280 341 322   Recent Labs  Lab 09/15/17 1021 09/15/17 1732 09/16/17 0549  NA 138  --  138  K 3.7  --  3.5  CL 102  --  104  CO2 20*  --  21*  GLUCOSE 153*  --  153*  BUN 6  --  12  CREATININE 0.92 1.10* 0.94  CALCIUM 10.0  --  9.1    Results/Tests Pending at Time of Discharge: None  Discharge Medications:  Allergies as of 09/17/2017   No Known Allergies     Medication List    STOP taking these medications   tiotropium 18 MCG inhalation capsule Commonly known as:  SPIRIVA     TAKE these medications   atorvastatin 80 MG tablet Commonly known as:  LIPITOR take 1 tablet by mouth once daily AT 6 PM   clopidogrel 75 MG tablet Commonly known as:  PLAVIX Take 1 tablet (75 mg total) by mouth daily.   doxycycline 100 MG tablet Commonly known as:  VIBRA-TABS Take 1 tablet (100 mg total) by mouth every 12 (twelve) hours.   Fluticasone-Umeclidin-Vilant 100-62.5-25 MCG/INH Aepb Commonly known as:  TRELEGY ELLIPTA Inhale 1 puff into the lungs daily.   guaiFENesin-dextromethorphan 100-10 MG/5ML syrup Commonly known as:  ROBITUSSIN DM Take 5 mLs by mouth every 4 (four) hours as needed for cough.   ipratropium 17 MCG/ACT inhaler Commonly known as:  ATROVENT HFA Inhale 2 puffs into the lungs every 6 (six) hours as needed for wheezing.   nicotine 14 mg/24hr patch Commonly known as:  NICODERM CQ - dosed in mg/24 hours Place 1 patch (14 mg total) onto the skin daily. Start  taking on:  09/18/2017   pantoprazole 20 MG tablet Commonly known as:  PROTONIX Take 1 tablet (20 mg total) by mouth daily.   predniSONE 50 MG tablet Commonly known as:  DELTASONE Take one tablet daily with breakfast. Start taking on:  09/18/2017   promethazine 25 MG tablet Commonly known as:  PHENERGAN take 1 tablet by mouth every 6 hours if needed for nausea and vomiting   RA ASPIRIN EC ADULT LOW ST 81 MG EC tablet Generic drug:  aspirin take 1 tablet by mouth once daily   sucralfate 1 GM/10ML suspension Commonly known as:  CARAFATE Take 10 mLs (1 g total) by mouth 4 (four) times daily -  with meals and at bedtime.   traMADol 50 MG tablet Commonly known as:  ULTRAM Take 1 tablet (50 mg total) by mouth every 12 (twelve) hours as needed. What changed:  reasons to take this   VENTOLIN HFA 108 (90 Base) MCG/ACT inhaler Generic drug:  albuterol inhale 2 puffs by mouth every 4 hours if needed for wheezing or shortness of breath       Discharge Instructions: Please refer to Patient Instructions section of EMR for full details.  Patient was counseled important signs and symptoms that should prompt return to medical care, changes in medications, dietary instructions, activity restrictions, and follow up appointments.   Follow-Up Appointments: Follow-up [place] Follow up on 09/23/2017.   Specialty:  Family Medicine Why:  With Dr. Waldon Reining at 2:30 pm  Contact information: [address] [id number] [place]          Nicolette Bang, DO 09/17/2017, 2:02 PM [place]

## 2017-09-21 NOTE — Progress Notes (Deleted)
   [facility] Phone: [phone]   Date of Visit: 09/23/2017   HPI:  ***  ROS: See HPI.  [place]: ***  PHYSICAL EXAM: There were no vitals taken for this visit. Gen: *** HEENT: *** Heart: *** Lungs: *** Neuro: *** Ext: ***  ASSESSMENT/PLAN:  Health maintenance:  -***  No problem-specific Assessment & Plan notes found for this encounter.  FOLLOW UP: Follow up in *** for ***  Smiley Houseman, MD PGY [place]

## 2017-09-23 ENCOUNTER — Inpatient Hospital Stay: Payer: Medicare HMO | Admitting: Internal Medicine

## 2017-09-24 ENCOUNTER — Ambulatory Visit: Payer: Self-pay | Admitting: Pharmacist

## 2017-09-24 ENCOUNTER — Other Ambulatory Visit: Payer: Self-pay | Admitting: Pharmacist

## 2017-09-24 ENCOUNTER — Other Ambulatory Visit: Payer: Self-pay | Admitting: Internal Medicine

## 2017-09-24 NOTE — Patient Outreach (Signed)
[place] [facility]) Care Management  09/24/2017  [name] Jan 21, 1951 [id number]   Patient was called for post discharge medication review. Unfortunately, she did not answer the phone. HIPAA compliant message was left with a female who answered the phone.  Plan: I will call the patient back in 5-7 business days.  Elayne Guerin, PharmD, [place] Clinical Pharmacist [phone]

## 2017-09-24 NOTE — Patient Outreach (Deleted)
[facility]) Care Management  09/24/2017  [name] Aug 16, 1951 [id number]

## 2017-09-24 NOTE — Telephone Encounter (Signed)
Patient has an appointment on 09/28/17. Jazmin Hartsell,CMA  

## 2017-09-28 ENCOUNTER — Encounter: Payer: Self-pay | Admitting: Internal Medicine

## 2017-09-28 ENCOUNTER — Encounter: Payer: Self-pay | Admitting: Obstetrics and Gynecology

## 2017-09-28 ENCOUNTER — Other Ambulatory Visit: Payer: Self-pay

## 2017-09-28 ENCOUNTER — Ambulatory Visit (INDEPENDENT_AMBULATORY_CARE_PROVIDER_SITE_OTHER): Payer: Medicare HMO | Admitting: Internal Medicine

## 2017-09-28 VITALS — BP 158/82 | HR 88 | Temp 98.4°F | Wt 164.0 lb

## 2017-09-28 DIAGNOSIS — I2721 Secondary pulmonary arterial hypertension: Secondary | ICD-10-CM | POA: Diagnosis not present

## 2017-09-28 DIAGNOSIS — Z09 Encounter for follow-up examination after completed treatment for conditions other than malignant neoplasm: Secondary | ICD-10-CM | POA: Diagnosis not present

## 2017-09-28 DIAGNOSIS — J441 Chronic obstructive pulmonary disease with (acute) exacerbation: Secondary | ICD-10-CM | POA: Diagnosis not present

## 2017-09-28 MED ORDER — TRAMADOL HCL 50 MG PO TABS: 50.0000 mg | ORAL_TABLET | Freq: Two times a day (BID) | ORAL | 0 refills | Status: DC | PRN

## 2017-09-28 MED ORDER — PANTOPRAZOLE SODIUM 20 MG PO TBEC: 20.0000 mg | DELAYED_RELEASE_TABLET | Freq: Every day | ORAL | 0 refills | Status: DC

## 2017-09-28 NOTE — Progress Notes (Signed)
   [facility] Phone: [phone]   Date of Visit: 09/28/2017   HPI:  Hospital Follow Up: - patient was treated for COPD exacerbation. With Doxycycline and Prednisone. Trelegy was started in place of Spiriva. No desaturaions  - chest pain was thought to be MSK in etiology due to cough. EKG was unremarkable and troponins were negative x 3  - leg pain/PAD - was discharged with Nicotine patch 14mg  but patient reports she never got his from the pharmacy with her other medications.  - patient reports she is doing better since the discharge but not quite back to her baseline.  - she reports of 3-4 month history of orthopnea and dyspnea on exertion.  - she also reports of chest pain/reflux in the past 2-3 months which is usually after a meal or when she lays down at night. No associated shortness of breath or diaphoresis. She was on Protonix but is no longer taking it. She stopped this for an unknown reason.  - her leg still hurts. Per chart review, unable to get a urgent visit with Dr. Donzetta Matters. Will ask referral coordinator to try other places in [place], [place], or [place].   ROS: See HPI.  [place]:  Asthma-COPD PAD GER Cyclic Vomiting Psoriasis Tobacco Use  PHYSICAL EXAM: BP (!) 158/82   Pulse 88   Temp 98.4 F (36.9 C) (Oral)   Wt 164 lb (74.4 kg)   SpO2 99%   BMI 26.47 kg/m  GEN: NAD CV: RRR, no murmurs, rubs, or gallops PULM: CTAB, normal effort SKIN: No rash or cyanosis; warm and well-perfused EXTR: No lower extremity edema or calf tenderness. Unable to palpate left DP and PT pulse but capillary refill on this leg is normal.  PSYCH: Mood and affect euthymic, normal rate and volume of speech NEURO: Awake, alert, no focal deficits grossly, normal speech   ASSESSMENT/PLAN: 1. Hospital discharge follow-up 2. COPD exacerbation ([place]) 3. Pulmonary artery hypertension (HCC) COPD symptoms seem to have improved. However, patient reports of  dyspnea on exertion and orthopnea. She is euvolemic on exam. Her ECHo in 2017 showed elevated pulmonary artery pressure to 38. This is possibly contributing to her symptoms. Will make a referral to cardiology for further evaluation.  - Ambulatory referral to Cardiology  Left Leg Pain: PVD Will ask referral coordinator to try other places in [place], [place], or [place] since [facility] cannot see patient.    Smiley Houseman, MD PGY [place]

## 2017-09-28 NOTE — Patient Instructions (Addendum)
Please give [facility] a call to see what they meant about their bill. I think you need to see them for your vomiting symptoms.  Address: [address], [place], [place] [address]  Phone: [phone]   I made a referral to the heart doctor to help [place] figure out why you are short of breath.  Your blood pressure was elevated today. I would like you to come back for a nurse visit in 1 week to get your blood pressure rechecked. We may need to start you on a blood pressure medication.   I will talk to our referral coordinator about your vascular surgery referral.  Please follow up in 2-3 weeks. We need to do a pap smear.

## 2017-09-29 ENCOUNTER — Encounter: Payer: Self-pay | Admitting: Internal Medicine

## 2017-10-02 ENCOUNTER — Ambulatory Visit: Payer: Self-pay | Admitting: Pharmacist

## 2017-10-02 ENCOUNTER — Other Ambulatory Visit: Payer: Self-pay | Admitting: Pharmacist

## 2017-10-02 NOTE — Patient Outreach (Signed)
[place] [facility]) Care Management  10/02/2017  [name] November 01, 1950 [id number]   Patient was called for post discharge medication review. Unfortunately, she did not answer the phone. HIPAA compliant message was left with a female who answered the phone.  Plan: I will call the patient back in 5-7 business days.  Elayne Guerin, PharmD, [place] Clinical Pharmacist [phone]

## 2017-10-05 ENCOUNTER — Ambulatory Visit: Payer: Self-pay | Admitting: Pharmacist

## 2017-10-05 ENCOUNTER — Other Ambulatory Visit: Payer: Self-pay | Admitting: Pharmacist

## 2017-10-05 ENCOUNTER — Ambulatory Visit: Payer: Medicare HMO

## 2017-10-05 DIAGNOSIS — Z0130 Encounter for examination of blood pressure without abnormal findings: Secondary | ICD-10-CM

## 2017-10-05 NOTE — Progress Notes (Signed)
   Patient here today for BP check. BP today is 114/64. Checked BP in left arm with normal cuff.  Symptoms present none. Patient not currently on any hypertension medications. Routed note to PCP.    Esau Grew, RN

## 2017-10-05 NOTE — Patient Outreach (Signed)
[place] [facility]) Care Management  10/05/2017  Senovia Gauer 01/19/1951 [id number]   Patient was called regarding medication review post discharge. Unfortunately, she did not answer the phone and there was no availability to leave a message. This was the third unsuccessful attempt to reach the patient.   Plan: Send an unsuccessful contact letter. If the patient does not respond within ten business days, close the pharmacy case.  Elayne Guerin, PharmD, [place] Clinical Pharmacist [phone]

## 2017-10-14 ENCOUNTER — Other Ambulatory Visit: Payer: Self-pay

## 2017-10-14 DIAGNOSIS — I739 Peripheral vascular disease, unspecified: Secondary | ICD-10-CM

## 2017-10-19 ENCOUNTER — Ambulatory Visit (INDEPENDENT_AMBULATORY_CARE_PROVIDER_SITE_OTHER): Payer: Medicare HMO | Admitting: Internal Medicine

## 2017-10-19 ENCOUNTER — Other Ambulatory Visit: Payer: Self-pay

## 2017-10-19 ENCOUNTER — Encounter: Payer: Self-pay | Admitting: Internal Medicine

## 2017-10-19 VITALS — BP 100/70 | HR 88 | Temp 98.1°F | Wt 154.0 lb

## 2017-10-19 DIAGNOSIS — M79672 Pain in left foot: Secondary | ICD-10-CM

## 2017-10-19 NOTE — Progress Notes (Signed)
   [facility] Phone: [phone]   Date of Visit: 10/19/2017   HPI:  Left Foot Pain:  -She has chronic left leg and left foot pain -Her left leg pain is likely vascular in etiology especially as she has a past medical history of peripheral vascular disease requiring a stent.  She has an appointment with vascular surgery in the near future.  There are no signs of acute ischemia of the leg. -She also complains of dorsal left foot pain.  She denies any injury to the area in the recent past.  She denies any swelling.  It is difficult for her to walk due to the pain.  The pain does not radiate.  ROS: See HPI.  [place]:  PMH: PVD,HTN, COPD, GERD, Cyclic vomiting  PHYSICAL EXAM: BP 100/70   Pulse 88   Temp 98.1 F (36.7 C) (Oral)   Wt 154 lb (69.9 kg)   SpO2 99%   BMI 24.86 kg/m  GEN: NAD CV: RRR, no murmurs, rubs, or gallops PULM: CTAB, normal effort SKIN: No rash or cyanosis; warm and well-perfused EXTR: No lower extremity edema or calf tenderness; unable to palpate DP pulse on the left lower extremity (which is not new) but the leg is warm with good capillary refill. No swelling of the foot. No tenderness to palpation on the ankle. There is point tenderness over the area of the second and third cuneiform bones where these bones meet the metatarsal heads. Normal range of motion of the foot and toes. The overlying area is not warm or erythematous PSYCH: Mood and affect euthymic, normal rate and volume of speech NEURO: Awake, alert, no focal deficits grossly, normal speech   ASSESSMENT/PLAN:  Foot Pain:  Chronic. No history of trauma/injury. Likely arthritis. Will obtain xray of the foot to further evaluate.    Smiley Houseman, MD PGY [place]

## 2017-10-19 NOTE — Patient Instructions (Addendum)
[facility] phone number  [phone]  We will get a xray of your foot to help [place] figure out what is causing the pain.

## 2017-10-20 ENCOUNTER — Ambulatory Visit: Payer: Self-pay | Admitting: Pharmacist

## 2017-10-20 ENCOUNTER — Other Ambulatory Visit: Payer: Self-pay | Admitting: Pharmacist

## 2017-10-20 NOTE — Patient Outreach (Signed)
[facility]) Care Management  10/20/2017  [name] [birth year] [id number]  Patient was called regarding medication review post discharge. Unfortunately, she did not answer the phone and there was no availability to leave a message. Patient has been called more than 3 times without successful contact and she was sent an unsuccessful contact letter without response.   Plan: Close patient's case and send letters to the patient and her PCP.   Elayne Guerin, PharmD, [place] Clinical Pharmacist [phone]

## 2017-10-21 ENCOUNTER — Encounter: Payer: Self-pay | Admitting: Internal Medicine

## 2017-10-23 ENCOUNTER — Ambulatory Visit (HOSPITAL_COMMUNITY)
Admission: RE | Admit: 2017-10-23 | Discharge: 2017-10-23 | Disposition: A | Discharge status: Home / Self Care | Payer: Medicare HMO | Source: Ambulatory Visit | Attending: Family Medicine | Admitting: Family Medicine

## 2017-10-23 ENCOUNTER — Other Ambulatory Visit: Payer: Self-pay | Admitting: Internal Medicine

## 2017-10-23 DIAGNOSIS — M7732 Calcaneal spur, left foot: Secondary | ICD-10-CM | POA: Diagnosis not present

## 2017-10-23 DIAGNOSIS — M79672 Pain in left foot: Secondary | ICD-10-CM

## 2017-10-23 DIAGNOSIS — M79671 Pain in right foot: Secondary | ICD-10-CM | POA: Diagnosis not present

## 2017-10-26 ENCOUNTER — Ambulatory Visit (HOSPITAL_COMMUNITY)
Admission: RE | Admit: 2017-10-26 | Discharge: 2017-10-26 | Disposition: A | Discharge status: Home / Self Care | Payer: Medicare HMO | Source: Ambulatory Visit | Attending: Surgery | Admitting: Surgery

## 2017-10-26 DIAGNOSIS — I10 Essential (primary) hypertension: Secondary | ICD-10-CM | POA: Diagnosis not present

## 2017-10-26 DIAGNOSIS — I739 Peripheral vascular disease, unspecified: Secondary | ICD-10-CM | POA: Insufficient documentation

## 2017-10-26 DIAGNOSIS — F17200 Nicotine dependence, unspecified, uncomplicated: Secondary | ICD-10-CM | POA: Diagnosis not present

## 2017-10-26 DIAGNOSIS — R0989 Other specified symptoms and signs involving the circulatory and respiratory systems: Secondary | ICD-10-CM | POA: Insufficient documentation

## 2017-10-27 ENCOUNTER — Encounter: Payer: Self-pay | Admitting: Internal Medicine

## 2017-10-27 ENCOUNTER — Telehealth: Payer: Self-pay | Admitting: Internal Medicine

## 2017-10-27 DIAGNOSIS — R7303 Prediabetes: Secondary | ICD-10-CM | POA: Insufficient documentation

## 2017-10-27 MED ORDER — MELOXICAM 7.5 MG PO TABS: 7.5000 mg | ORAL_TABLET | Freq: Every day | ORAL | 0 refills | Status: DC

## 2017-10-27 NOTE — Telephone Encounter (Signed)
Called patient to discuss x ray foot. No findings to explain the foot pain. There was nonspecific swelling over the area she has pain. Will try short course of NSAID, Mobic 7.5 daily for 4 days then as needed up to 1 week. Discussed side effects.

## 2017-10-30 ENCOUNTER — Ambulatory Visit: Payer: Medicare HMO | Admitting: Vascular Surgery

## 2017-11-02 ENCOUNTER — Encounter: Payer: Self-pay | Admitting: Obstetrics and Gynecology

## 2017-11-02 ENCOUNTER — Other Ambulatory Visit (HOSPITAL_COMMUNITY)
Admission: RE | Admit: 2017-11-02 | Discharge: 2017-11-02 | Disposition: A | Discharge status: Home / Self Care | Payer: Medicare HMO | Source: Ambulatory Visit | Attending: Obstetrics and Gynecology | Admitting: Obstetrics and Gynecology

## 2017-11-02 ENCOUNTER — Encounter: Payer: Medicare HMO | Admitting: Obstetrics and Gynecology

## 2017-11-02 ENCOUNTER — Ambulatory Visit (INDEPENDENT_AMBULATORY_CARE_PROVIDER_SITE_OTHER): Payer: Medicare HMO | Admitting: Obstetrics and Gynecology

## 2017-11-02 ENCOUNTER — Telehealth: Payer: Self-pay

## 2017-11-02 VITALS — BP 125/76 | HR 84 | Ht 66.5 in | Wt 157.5 lb

## 2017-11-02 DIAGNOSIS — R87622 Low grade squamous intraepithelial lesion on cytologic smear of vagina (LGSIL): Secondary | ICD-10-CM | POA: Diagnosis not present

## 2017-11-02 DIAGNOSIS — Z0000 Encounter for general adult medical examination without abnormal findings: Secondary | ICD-10-CM

## 2017-11-02 DIAGNOSIS — Z90710 Acquired absence of both cervix and uterus: Secondary | ICD-10-CM | POA: Diagnosis not present

## 2017-11-02 DIAGNOSIS — Z0001 Encounter for general adult medical examination with abnormal findings: Secondary | ICD-10-CM

## 2017-11-02 DIAGNOSIS — Z1151 Encounter for screening for human papillomavirus (HPV): Secondary | ICD-10-CM | POA: Insufficient documentation

## 2017-11-02 NOTE — Progress Notes (Signed)
m   GYNECOLOGY ANNUAL PREVENTATIVE CARE ENCOUNTER NOTE  Subjective:   [name] is a 67 y.o. P2 female here for a annual gynecologic exam. Current complaints: none.  Denies abnormal vaginal bleeding, discharge, pelvic pain, problems with intercourse or other gynecologic concerns. Reports she had hysterectomy in 1975 done at a hospital in [facility]). She reports it is a partial hysterectomy which she thinks was done through the vagina but not sure, she is unsure what was removed. Not sure if she has tubes/ovaries/cervix. She did keep getting pap smears after her hysterectomy, most recently 2011. She never had menopausal symptoms.    Gynecologic History No LMP recorded. Patient has had a hysterectomy. Contraception: none Last Pap: 2011. Results were: LGSIL, CIN-I, VAIN-I, HPV Last mammogram: 2014. Results were: normal  Obstetric History P2 2 x SVD  Past Medical History:  Diagnosis Date  . Abdominal pain, epigastric 02/23/2013  . Abdominal pain, unspecified site 08/15/2013  . Abnormal neurological exam 04/11/2016  . Asthma   . Cataracts, bilateral 05/03/2014  . Chronic bronchitis ([place])   . COPD (chronic obstructive pulmonary disease) ([place])   . Daily headache   . De Quervain's tenosynovitis, left 11/09/2014  . GERD (gastroesophageal reflux disease)   . Headache(784.0) 04/29/2014  . Hx of colonic polyps   . Nausea & vomiting   . PSORIASIS 12/29/2006   Qualifier: Diagnosis of  By: Jobe Igo MD, Shanon Brow    . Right leg pain 04/02/2016  . Smoker     Past Surgical History:  Procedure Laterality Date  . ABDOMINAL AORTOGRAM W/LOWER EXTREMITY N/A 10/06/2016   Procedure: Abdominal Aortogram w/Lower Extremity;  Surgeon: Waynetta Sandy, MD;  Location: [facility];  Service: Cardiovascular;  Laterality: N/A;  . ABDOMINAL HERNIA REPAIR    . APPENDECTOMY    . COLONOSCOPY W/ POLYPECTOMY  02/26/2005  . HERNIA REPAIR    . PERIPHERAL VASCULAR ATHERECTOMY Right  10/06/2016   Procedure: Peripheral Vascular Atherectomy;  Surgeon: Waynetta Sandy, MD;  Location: [facility];  Service: Cardiovascular;  Laterality: Right;  . PERIPHERAL VASCULAR INTERVENTION Right 10/06/2016   Procedure: Peripheral Vascular Intervention;  Surgeon: Waynetta Sandy, MD;  Location: [facility];  Service: Cardiovascular;  Laterality: Right;  SFA  . TONSILLECTOMY  1960s  . UPPER GASTROINTESTINAL ENDOSCOPY  03/22/2010, 02/13/2005  . VAGINAL HYSTERECTOMY     partial    Current Outpatient Medications on File Prior to Visit  Medication Sig Dispense Refill  . atorvastatin (LIPITOR) 80 MG tablet take 1 tablet by mouth once daily AT 6 PM 30 tablet 1  . clopidogrel (PLAVIX) 75 MG tablet Take 1 tablet (75 mg total) by mouth daily. 30 tablet 3  . Fluticasone-Umeclidin-Vilant (TRELEGY ELLIPTA) 100-62.5-25 MCG/INH AEPB Inhale 1 puff into the lungs daily. 28 each 0  . ipratropium (ATROVENT HFA) 17 MCG/ACT inhaler Inhale 2 puffs into the lungs every 6 (six) hours as needed for wheezing. 1 Inhaler 12  . pantoprazole (PROTONIX) 20 MG tablet Take 1 tablet (20 mg total) by mouth daily. 30 tablet 0  . promethazine (PHENERGAN) 25 MG tablet take 1 tablet by mouth every 6 hours if needed for nausea and vomiting 30 tablet 2  . RA ASPIRIN EC ADULT LOW ST 81 MG EC tablet take 1 tablet by mouth once daily 30 tablet 0  . VENTOLIN HFA 108 (90 Base) MCG/ACT inhaler inhale 2 puffs by mouth every 4 hours if needed for wheezing or shortness of breath 18 g  1  . meloxicam (MOBIC) 7.5 MG tablet Take 1 tablet (7.5 mg total) by mouth daily. For 4 days then take as needed for foot pain (Patient not taking: Reported on 11/02/2017) 7 tablet 0  . nicotine (NICODERM CQ - DOSED IN MG/24 HOURS) 14 mg/24hr patch Place 1 patch (14 mg total) onto the skin daily. (Patient not taking: Reported on 11/02/2017) 14 patch 0  . traMADol (ULTRAM) 50 MG tablet take 1 tablet by mouth every 12 hours if needed  (Patient not taking: Reported on 11/02/2017) 14 tablet 0   No current facility-administered medications on file prior to visit.     No Known Allergies  Social History   Socioeconomic History  . Marital status: Single    Spouse name: Not on file  . Number of children: Not on file  . Years of education: Not on file  . Highest education level: Not on file  Social Needs  . Financial resource strain: Not on file  . Food insecurity - worry: Not on file  . Food insecurity - inability: Not on file  . Transportation needs - medical: Not on file  . Transportation needs - non-medical: Not on file  Occupational History  . Not on file  Tobacco Use  . Smoking status: Current Every Day Smoker    Packs/day: 0.25    Years: 47.00    Pack years: 11.75    Types: Cigarettes  . Smokeless tobacco: Never Used  Substance and Sexual Activity  . Alcohol use: No    Alcohol/week: 0.0 oz    Comment: Quit alcohol in 1992; "never really drank much alcohol"  . Drug use: Yes    Types: Marijuana    Comment: 1/15/12019 "once/wk"  . Sexual activity: No  Other Topics Concern  . Not on file  Social History Narrative  . Not on file    Family History  Problem Relation Age of Onset  . Cancer Other   . Coronary artery disease Other   . Cancer Mother   . Hypertension Mother   . Heart disease Mother   . Cancer Father   . Hypertension Father   . Cancer Sister   . Hypertension Sister     Diet: has been losing weight, has not changed diet Exercise: walks regularly  The following portions of the patient's history were reviewed and updated as appropriate: allergies, current medications, past family history, past medical history, past social history, past surgical history and problem list.  Review of Systems Pertinent items are noted in HPI.   Objective:  BP 125/76   Pulse 84   Ht 5' 6.5" (1.689 m)   Wt 157 lb 8 oz (71.4 kg)   BMI 25.04 kg/m  CONSTITUTIONAL: Well-developed, well-nourished female  in no acute distress.  HENT:  Normocephalic, atraumatic, External right and left ear normal. Oropharynx is clear and moist EYES: Conjunctivae and EOM are normal. Pupils are equal, round, and reactive to light. No scleral icterus.  NECK: Normal range of motion, supple, no masses.  Normal thyroid.  SKIN: Skin is warm and dry. No rash noted. Not diaphoretic. No erythema. No pallor. NEUROLOGIC: Alert and oriented to person, place, and time. Normal reflexes, muscle tone coordination. No cranial nerve deficit noted. PSYCHIATRIC: Normal mood and affect. Normal behavior. Normal judgment and thought content. CARDIOVASCULAR: Normal heart rate noted, regular rhythm RESPIRATORY: Clear to auscultation bilaterally. Effort and breath sounds normal, no problems with respiration noted. BREASTS: Symmetric in size. No masses, skin changes, nipple drainage, or  lymphadenopathy. ABDOMEN: Soft, normal bowel sounds, no distention noted.  No tenderness, rebound or guarding. Well healed vertical midline incision PELVIC: Normal appearing mildly atrophic external female genitalia; normal appearing vaginal mucosa, cervix surgically absent.  No abnormal discharge noted.  Pap smear of cuff obtained.  Uterus surgically absent, no adnexal fullness noted but generalized mild tenderness on exam MUSCULOSKELETAL: Normal range of motion. No tenderness.  No cyanosis, clubbing, or edema.  2+ distal pulses.  Assessment and Plan:   1. Annual physical exam Normal exam - MM SCREENING BREAST TOMO BILATERAL; Future Declined STI testing, had done in 09/2017  2. LGSIL Pap smear of vagina Patient s/p hysterectomy, thinks vaginally but has large vertical midline incision which she believes also may be from hysterectomy as she denies h/o c-section. Reports paps after her hysterectomy, had LGSIL/CIN/VAIN pap in 2011, repeated cuff pap today   Will follow up results of pap smear and manage accordingly. Encouraged improvement in diet and  exercise.  Mammogram scheduled Colonoscopy UTD   Routine preventative health maintenance measures emphasized. Please refer to After Visit Summary for other counseling recommendations.    Feliz Beam, M.D. Attending [place], [facility] for [employer], [place]

## 2017-11-02 NOTE — Telephone Encounter (Signed)
Called pt and left a message with grand daughter Loma Sousa about  Mammogram appt scheduled on March 22 at 12:30pm.

## 2017-11-03 ENCOUNTER — Other Ambulatory Visit: Payer: Self-pay | Admitting: Internal Medicine

## 2017-11-04 LAB — CYTOLOGY - PAP
BACTERIAL VAGINITIS: POSITIVE — AB
Diagnosis: NEGATIVE
HPV (WINDOPATH): NOT DETECTED

## 2017-11-13 ENCOUNTER — Other Ambulatory Visit: Payer: Self-pay | Admitting: Internal Medicine

## 2017-11-17 ENCOUNTER — Other Ambulatory Visit: Payer: Self-pay | Admitting: Internal Medicine

## 2017-11-18 NOTE — Telephone Encounter (Signed)
Attempted to call patient to discus refill. She reports that she did go to vascular surgery but does not know what was discussed. She reports that "they got xrays and they said that they were going to send it to the PCP". I do not see any clinic notes from vascular or any new imaging studies. I asked her to call the vascular surgery clinic and request this. I will refill Mobic. She is not having any GI discomfort with the medication.

## 2017-11-20 ENCOUNTER — Ambulatory Visit: Payer: Medicare HMO

## 2017-11-28 ENCOUNTER — Other Ambulatory Visit: Payer: Self-pay | Admitting: Internal Medicine

## 2017-12-07 ENCOUNTER — Other Ambulatory Visit: Payer: Self-pay | Admitting: Internal Medicine

## 2017-12-11 ENCOUNTER — Other Ambulatory Visit: Payer: Self-pay | Admitting: Internal Medicine

## 2017-12-24 ENCOUNTER — Other Ambulatory Visit: Payer: Self-pay | Admitting: Internal Medicine

## 2017-12-30 ENCOUNTER — Other Ambulatory Visit: Payer: Self-pay

## 2017-12-30 ENCOUNTER — Encounter: Payer: Self-pay | Admitting: Internal Medicine

## 2017-12-30 ENCOUNTER — Ambulatory Visit (INDEPENDENT_AMBULATORY_CARE_PROVIDER_SITE_OTHER): Payer: Medicare HMO | Admitting: Internal Medicine

## 2017-12-30 VITALS — BP 124/64 | HR 74 | Temp 98.6°F | Ht 67.0 in | Wt 160.0 lb

## 2017-12-30 DIAGNOSIS — Z1211 Encounter for screening for malignant neoplasm of colon: Secondary | ICD-10-CM | POA: Diagnosis not present

## 2017-12-30 DIAGNOSIS — M79672 Pain in left foot: Secondary | ICD-10-CM

## 2017-12-30 MED ORDER — TETANUS-DIPHTH-ACELL PERTUSSIS 5-2.5-18.5 LF-MCG/0.5 IM SUSP: 0.5000 mL | Freq: Once | INTRAMUSCULAR | 0 refills | Status: AC

## 2017-12-30 MED ORDER — MELOXICAM 15 MG PO TABS: 15.0000 mg | ORAL_TABLET | Freq: Every day | ORAL | 0 refills | Status: DC | PRN

## 2017-12-30 NOTE — Patient Instructions (Addendum)
[facility]: [phone]  I made a referral to the sports medicine doctor for your foot. You may benefit from an injection.   I made a referral to the GI doc for your colonoscopy.   I sent a prescription for your tetanus shot to your pharamacy

## 2017-12-30 NOTE — Progress Notes (Signed)
   [facility] Phone: [phone]   Date of Visit: 12/30/2017   HPI:  Left Foot Pain:  - chronic left foot pain on the dorsal aspect  - no past injury  - we had an xray done of the foot which did not show any significant findings - it is difficult for her to walk due to the pain. - the pain does not radiate. -Has not improved with NSAIDs or tramadol   ROS: See HPI.  [place]:  PMH: PVD COPD-Asthma  GERD Prediabetes   PHYSICAL EXAM: BP 124/64   Pulse 74   Temp 98.6 F (37 C) (Oral)   Ht 5\' 7"  (1.702 m)   Wt 160 lb (72.6 kg)   SpO2 97%   BMI 25.06 kg/m  GEN: NAD MSK:  SKIN: No rash or cyanosis; warm and well-perfused EXTR: No lower extremity edema or calf tenderness  MSK: Tenderness to palpation at the proximal metatarsal heads of the first and second metatarsal bones.  It seems that there is tenderness in between these 2 structures.  Squeezing of the forefoot causes pain at that same site. PSYCH: Mood and affect euthymic, normal rate and volume of speech NEURO: Awake, alert, no focal deficits grossly, normal speech   ASSESSMENT/PLAN:  Health maintenance:  - reminded her to get mammogram  - Tdap sent to pharmacy   1. Left foot pain Not improving with conservative therapy.  Possibly due to Morton's neuroma.  X-ray of the foot was unremarkable. - Ambulatory referral to Sports Medicine patient would benefit from possible metatarsal pad or insoles as well as ultrasound evaluation for possible steroid injection.  2. Screening for colon cancer - Ambulatory referral to Gastroenterology  Smiley Houseman, MD PGY [place]

## 2018-01-04 NOTE — Progress Notes (Signed)
Cardiology Office Note    Date:  01/05/2018   ID:  [name], DOB 1950/09/13, MRN [id number]  PCP:  Smiley Houseman, MD  Cardiologist: Sinclair Grooms, MD   No chief complaint on file.   History of Present Illness:  Armonee Fuller is a 67 y.o. female  Referred for evaluation of pulmonary hypertension who is referred for evaluation of decreased pulses in her legs by Dr. Dallas Schimke.  We spent significant time trying to figure out why she is actually being seen in the office.  Eventually we identified that the referral was made in January and it was to evaluate shortness of breath.  The patient will only talk about pain in her left foot today.  She feels that the appointment with [place] is to consider management strategies for foot and leg pain.  She has bilateral lower extremity vascular obstructive disease, right femoral stent, and claudication in both legs.  Over the past 2 months she has had near continuous pain with pins-and-needles-like sensation in the left foot.  The only relief she can get is lying down with her foot elevated and using a heating pad.  She states her breathing is now better.  She still has occasional episodes of orthopnea that required to a more pillows to allow sleep.  She has not had lower extremity swelling.  She denies chest pain.  She continues to smoke.  Past Medical History:  Diagnosis Date  . Abdominal pain, epigastric 02/23/2013  . Abdominal pain, unspecified site 08/15/2013  . Abnormal neurological exam 04/11/2016  . Asthma   . Cataracts, bilateral 05/03/2014  . Chronic bronchitis ([place])   . COPD (chronic obstructive pulmonary disease) ([place])   . Daily headache   . De Quervain's tenosynovitis, left 11/09/2014  . GERD (gastroesophageal reflux disease)   . Headache(784.0) 04/29/2014  . Hx of colonic polyps   . Nausea & vomiting   . PSORIASIS 12/29/2006   Qualifier: Diagnosis of  By: Jobe Igo MD, Shanon Brow    . Right leg pain 04/02/2016  . Smoker     Past  Surgical History:  Procedure Laterality Date  . ABDOMINAL AORTOGRAM W/LOWER EXTREMITY N/A 10/06/2016   Procedure: Abdominal Aortogram w/Lower Extremity;  Surgeon: Waynetta Sandy, MD;  Location: [facility];  Service: Cardiovascular;  Laterality: N/A;  . ABDOMINAL HERNIA REPAIR    . APPENDECTOMY    . COLONOSCOPY W/ POLYPECTOMY  02/26/2005  . HERNIA REPAIR    . PERIPHERAL VASCULAR ATHERECTOMY Right 10/06/2016   Procedure: Peripheral Vascular Atherectomy;  Surgeon: Waynetta Sandy, MD;  Location: [facility];  Service: Cardiovascular;  Laterality: Right;  . PERIPHERAL VASCULAR INTERVENTION Right 10/06/2016   Procedure: Peripheral Vascular Intervention;  Surgeon: Waynetta Sandy, MD;  Location: [facility];  Service: Cardiovascular;  Laterality: Right;  SFA  . TONSILLECTOMY  1960s  . UPPER GASTROINTESTINAL ENDOSCOPY  03/22/2010, 02/13/2005  . VAGINAL HYSTERECTOMY     partial    Current Medications: Outpatient Medications Prior to Visit  Medication Sig Dispense Refill  . atorvastatin (LIPITOR) 80 MG tablet TAKE 1 TABLET BY MOUTH ONCE DAILY AT 6 PM 30 tablet 4  . clopidogrel (PLAVIX) 75 MG tablet Take 1 tablet (75 mg total) by mouth daily. 30 tablet 3  . Fluticasone-Umeclidin-Vilant (TRELEGY ELLIPTA) 100-62.5-25 MCG/INH AEPB Inhale 1 puff into the lungs daily. 28 each 0  . ipratropium (ATROVENT HFA) 17 MCG/ACT inhaler Inhale 2 puffs into the lungs every 6 (six) hours as  needed for wheezing. 1 Inhaler 12  . meloxicam (MOBIC) 15 MG tablet Take 1 tablet (15 mg total) by mouth daily as needed for pain. As needed for foot pain (take with food) 10 tablet 0  . pantoprazole (PROTONIX) 20 MG tablet Take 20 mg by mouth daily as needed for heartburn.    . promethazine (PHENERGAN) 25 MG tablet take 1 tablet by mouth every 6 hours if needed for nausea and vomiting 30 tablet 2  . SPIRIVA HANDIHALER 18 MCG inhalation capsule Place 18 mcg into inhaler and inhale daily.   1  . VENTOLIN HFA 108 (90 Base) MCG/ACT inhaler INHALE 2 PUFFS BY MOUTH EVERY 4 HOURS IF NEEDED FOR WHEEZING OR SHORTNESS OF BREATH 18 g 0  . nicotine (NICODERM CQ - DOSED IN MG/24 HOURS) 14 mg/24hr patch Place 1 patch (14 mg total) onto the skin daily. (Patient not taking: Reported on 11/02/2017) 14 patch 0  . pantoprazole (PROTONIX) 20 MG tablet Take 1 tablet (20 mg total) by mouth daily. (Patient not taking: Reported on 01/05/2018) 30 tablet 0  . RA ASPIRIN EC ADULT LOW ST 81 MG EC tablet take 1 tablet by mouth once daily (Patient not taking: Reported on 01/05/2018) 30 tablet 0  . traMADol (ULTRAM) 50 MG tablet take 1 tablet by mouth every 12 hours if needed (Patient not taking: Reported on 11/02/2017) 14 tablet 0   No facility-administered medications prior to visit.      Allergies:   Patient has no known allergies.   Social History   Socioeconomic History  . Marital status: Single    Spouse name: Not on file  . Number of children: Not on file  . Years of education: Not on file  . Highest education level: Not on file  Occupational History  . Not on file  Social Needs  . Financial resource strain: Not on file  . Food insecurity:    Worry: Not on file    Inability: Not on file  . Transportation needs:    Medical: Not on file    Non-medical: Not on file  Tobacco Use  . Smoking status: Current Every Day Smoker    Packs/day: 0.25    Years: 47.00    Pack years: 11.75    Types: Cigarettes  . Smokeless tobacco: Never Used  Substance and Sexual Activity  . Alcohol use: No    Alcohol/week: 0.0 oz    Comment: Quit alcohol in 1992; "never really drank much alcohol"  . Drug use: Yes    Types: Marijuana    Comment: 1/15/12019 "once/wk"  . Sexual activity: Never  Lifestyle  . Physical activity:    Days per week: Not on file    Minutes per session: Not on file  . Stress: Not on file  Relationships  . Social connections:    Talks on phone: Not on file    Gets together: Not on file     Attends religious service: Not on file    Active member of club or organization: Not on file    Attends meetings of clubs or organizations: Not on file    Relationship status: Not on file  Other Topics Concern  . Not on file  Social History Narrative  . Not on file     Family History:  The patient's family history includes Cancer in her father, mother, other, and sister; Coronary artery disease in her other; Heart disease in her mother; Hypertension in her father, mother, and sister.  ROS:   Please see the history of present illness.    Positive review of systems including weight gain, waking up from sleep short of breath, leg swelling, cough, vision disturbance, abdominal pain, muscle pain, constipation, leg swelling, difficulty with balance, headaches, and foot pain. All other systems reviewed and are negative.   PHYSICAL EXAM:   VS:  BP 134/78   Pulse 83   Ht 5' 6.5" (1.689 m)   Wt 158 lb 9.6 oz (71.9 kg)   BMI 25.22 kg/m    GEN: Well nourished, well developed, in no acute distress  HEENT: normal  Neck: no JVD, carotid bruits, or masses Cardiac: RRR; no murmurs, rubs, or gallops,no edema .  Bilateral absent lower extremity pulses including popliteal.  Left femoral bruit.  Absent right femoral bruit bruit.  1+ femoral pulses bilateral. 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  Recent Labs: 09/15/2017: TSH 0.329 09/16/2017: BUN 12; Creatinine, Ser 0.94; Hemoglobin 11.3; Platelets  322; Potassium 3.5; Sodium 138   Lipid Panel    Component Value Date/Time   CHOL 185 09/15/2017 1732   TRIG 72 09/15/2017 1732   HDL 44 09/15/2017 1732   CHOLHDL 4.2 09/15/2017 1732   VLDL 14 09/15/2017 1732   LDLCALC 127 (H) 09/15/2017 1732    Additional studies/ records that were reviewed today include:  Prior echocardiogram February 2017 demonstrated PA systolic pressure of 38 and 2017, normal LV systolic function.    ASSESSMENT:    1. Dyspnea, unspecified type   2. PAD (peripheral artery disease) ([place]). left leg   3. Aortic atherosclerosis ([place])   4. Left foot pain   5. Tobacco abuse   6. Chronic obstructive pulmonary disease, unspecified COPD type ([place])      PLAN:  In order of problems listed above:  1. BNP.  2D Doppler echocardiogram.  Encouraged smoking cessation.  If regional wall motion abnormality is noted on echocardiogram, she will need to have an ischemic evaluation given risk factors, aortic atherosclerosis, and PAD. 2. Refer back to Dr. Joylene Igo for continued follow-up of lower extremity arterial disease 3. LDL cholesterol target should be less than 70 4. Left foot pain at rest of uncertain etiology.  Could be orthopedic, neurogenic, or circulatory. 5. Smoking cessation  Clinical follow-up based on cardiac evaluation..  Needs chronic follow-up with vascular vein specialist of [place].  Medication Adjustments/Labs and Tests Ordered: Current medicines are reviewed at length with the patient today.  Concerns regarding medicines are outlined above.  Medication changes, Labs and Tests ordered today are listed in the Patient Instructions below. There are no Patient Instructions on file for this visit.   Signed, Sinclair Grooms, MD  01/05/2018 2:44 PM    [facility] [address], [place], [place]  [address] Phone: [phone]; Fax: [phone]

## 2018-01-05 ENCOUNTER — Ambulatory Visit (INDEPENDENT_AMBULATORY_CARE_PROVIDER_SITE_OTHER): Payer: Medicare HMO | Admitting: Interventional Cardiology

## 2018-01-05 ENCOUNTER — Encounter

## 2018-01-05 ENCOUNTER — Encounter: Payer: Self-pay | Admitting: Interventional Cardiology

## 2018-01-05 VITALS — BP 134/78 | HR 83 | Ht 66.5 in | Wt 158.6 lb

## 2018-01-05 DIAGNOSIS — I739 Peripheral vascular disease, unspecified: Secondary | ICD-10-CM

## 2018-01-05 DIAGNOSIS — M79672 Pain in left foot: Secondary | ICD-10-CM | POA: Diagnosis not present

## 2018-01-05 DIAGNOSIS — J449 Chronic obstructive pulmonary disease, unspecified: Secondary | ICD-10-CM | POA: Diagnosis not present

## 2018-01-05 DIAGNOSIS — Z720 Tobacco use: Secondary | ICD-10-CM | POA: Diagnosis not present

## 2018-01-05 DIAGNOSIS — I700 Atherosclerosis of aorta: Secondary | ICD-10-CM

## 2018-01-05 DIAGNOSIS — R0600 Dyspnea, unspecified: Secondary | ICD-10-CM

## 2018-01-05 NOTE — Patient Instructions (Signed)
Medication Instructions:  Your physician recommends that you continue on your current medications as directed. Please refer to the Current Medication list given to you today.  Labwork: Pro BNP today  Testing/Procedures: Your physician has requested that you have an echocardiogram. Echocardiography is a painless test that uses sound waves to create images of your heart. It provides your doctor with information about the size and shape of your heart and how well your heart's chambers and valves are working. This procedure takes approximately one hour. There are no restrictions for this procedure.   Follow-Up: You have been referred to Vein and Vascular, Dr. Scot Dock, for management of current foot issues.  Your physician recommends that you schedule a follow-up appointment as needed with Dr. Tamala Julian.    Any Other Special Instructions Will Be Listed Below (If Applicable).     If you need a refill on your cardiac medications before your next appointment, please call your pharmacy.

## 2018-01-06 LAB — PRO B NATRIURETIC PEPTIDE: NT-PRO BNP: 170 pg/mL (ref 0–301)

## 2018-01-07 ENCOUNTER — Other Ambulatory Visit: Payer: Self-pay

## 2018-01-07 DIAGNOSIS — I739 Peripheral vascular disease, unspecified: Secondary | ICD-10-CM

## 2018-01-08 ENCOUNTER — Other Ambulatory Visit: Payer: Self-pay | Admitting: Internal Medicine

## 2018-01-12 ENCOUNTER — Other Ambulatory Visit: Payer: Self-pay

## 2018-01-12 ENCOUNTER — Ambulatory Visit (HOSPITAL_COMMUNITY): Payer: Medicare HMO | Attending: Cardiology

## 2018-01-12 DIAGNOSIS — Z720 Tobacco use: Secondary | ICD-10-CM | POA: Diagnosis not present

## 2018-01-12 DIAGNOSIS — J449 Chronic obstructive pulmonary disease, unspecified: Secondary | ICD-10-CM | POA: Insufficient documentation

## 2018-01-12 DIAGNOSIS — R0600 Dyspnea, unspecified: Secondary | ICD-10-CM | POA: Insufficient documentation

## 2018-01-12 DIAGNOSIS — I071 Rheumatic tricuspid insufficiency: Secondary | ICD-10-CM | POA: Insufficient documentation

## 2018-01-12 DIAGNOSIS — I253 Aneurysm of heart: Secondary | ICD-10-CM | POA: Diagnosis not present

## 2018-01-14 ENCOUNTER — Other Ambulatory Visit: Payer: Self-pay | Admitting: Internal Medicine

## 2018-01-16 ENCOUNTER — Other Ambulatory Visit: Payer: Self-pay | Admitting: Internal Medicine

## 2018-01-19 ENCOUNTER — Telehealth: Payer: Self-pay

## 2018-01-19 NOTE — Telephone Encounter (Signed)
Received fax from [employer] requesting prior authorization of Promethazine. PA submitted via CoverMyMeds. Status pending. Will recheck status in 24 hours. Danley Danker, RN [facility] RN)

## 2018-01-20 NOTE — Telephone Encounter (Signed)
noted 

## 2018-01-20 NOTE — Telephone Encounter (Signed)
Prior approval for Promethazine completed via CoverMyMeds. Med approved from 01/20/18 - 01/20/20. [facility] informed. Danley Danker, RN [facility] RN)

## 2018-01-27 ENCOUNTER — Other Ambulatory Visit: Payer: Self-pay | Admitting: Internal Medicine

## 2018-02-05 ENCOUNTER — Encounter: Payer: Self-pay | Admitting: Vascular Surgery

## 2018-02-05 ENCOUNTER — Ambulatory Visit: Payer: Medicare HMO | Admitting: Vascular Surgery

## 2018-02-05 ENCOUNTER — Other Ambulatory Visit: Payer: Self-pay

## 2018-02-05 ENCOUNTER — Ambulatory Visit (HOSPITAL_COMMUNITY)
Admission: RE | Admit: 2018-02-05 | Discharge: 2018-02-05 | Disposition: A | Discharge status: Home / Self Care | Payer: Medicare HMO | Source: Ambulatory Visit | Attending: Vascular Surgery | Admitting: Vascular Surgery

## 2018-02-05 ENCOUNTER — Encounter (HOSPITAL_COMMUNITY): Payer: Medicare HMO

## 2018-02-05 ENCOUNTER — Ambulatory Visit (INDEPENDENT_AMBULATORY_CARE_PROVIDER_SITE_OTHER): Payer: Medicare HMO | Admitting: Vascular Surgery

## 2018-02-05 ENCOUNTER — Encounter: Payer: Self-pay | Admitting: *Deleted

## 2018-02-05 ENCOUNTER — Other Ambulatory Visit: Payer: Self-pay | Admitting: *Deleted

## 2018-02-05 VITALS — BP 175/95 | HR 69 | Temp 97.8°F | Resp 16 | Ht 66.5 in | Wt 158.0 lb

## 2018-02-05 DIAGNOSIS — I739 Peripheral vascular disease, unspecified: Secondary | ICD-10-CM

## 2018-02-05 NOTE — Progress Notes (Signed)
Patient ID: [name], female   DOB: December 03, 1950, 67 y.o.   MRN: [id number]  Reason for Consult: PAD (Re-Eval per Dr. Daneen Schick [phone].  L foot pain x 3 wks.)   Referred by Smiley Houseman, MD  Subjective:     HPI:  [name] is a 67 y.o. female with a history of right foot ulceration is between her fourth and fifth toes.  She underwent atherectomy and drug-coated balloon angioplasty of her right SFA and required bailout stenting.  She subsequently healed her wound and had ABI of 1 on last check.  She now returns today with left foot pain that occurs at rest and keeps her up at night.  She does have some improvement with movement.  She does not have any tissue loss or ulceration.  She continues to smoke.  She is taking Plavix does not take other blood thinners.  She also takes a statin drug.  She has not had left lower extremity interventions.  Past Medical History:  Diagnosis Date  . Abdominal pain, epigastric 02/23/2013  . Abdominal pain, unspecified site 08/15/2013  . Abnormal neurological exam 04/11/2016  . Asthma   . Cataracts, bilateral 05/03/2014  . Chronic bronchitis ([place])   . COPD (chronic obstructive pulmonary disease) ([place])   . Daily headache   . De Quervain's tenosynovitis, left 11/09/2014  . GERD (gastroesophageal reflux disease)   . Headache(784.0) 04/29/2014  . Hx of colonic polyps   . Nausea & vomiting   . PSORIASIS 12/29/2006   Qualifier: Diagnosis of  By: Jobe Igo MD, Shanon Brow    . Right leg pain 04/02/2016  . Smoker    Family History  Problem Relation Age of Onset  . Cancer Other   . Coronary artery disease Other   . Cancer Mother   . Hypertension Mother   . Heart disease Mother   . Cancer Father   . Hypertension Father   . Cancer Sister   . Hypertension Sister    Past Surgical History:  Procedure Laterality Date  . ABDOMINAL AORTOGRAM W/LOWER EXTREMITY N/A 10/06/2016   Procedure: Abdominal Aortogram w/Lower Extremity;  Surgeon: Waynetta Sandy, MD;  Location: [facility];  Service: Cardiovascular;  Laterality: N/A;  . ABDOMINAL HERNIA REPAIR    . APPENDECTOMY    . COLONOSCOPY W/ POLYPECTOMY  02/26/2005  . HERNIA REPAIR    . PERIPHERAL VASCULAR ATHERECTOMY Right 10/06/2016   Procedure: Peripheral Vascular Atherectomy;  Surgeon: Waynetta Sandy, MD;  Location: [facility];  Service: Cardiovascular;  Laterality: Right;  . PERIPHERAL VASCULAR INTERVENTION Right 10/06/2016   Procedure: Peripheral Vascular Intervention;  Surgeon: Waynetta Sandy, MD;  Location: [facility];  Service: Cardiovascular;  Laterality: Right;  SFA  . TONSILLECTOMY  1960s  . UPPER GASTROINTESTINAL ENDOSCOPY  03/22/2010, 02/13/2005  . VAGINAL HYSTERECTOMY     partial    Short Social History:  Social History   Tobacco Use  . Smoking status: Current Every Day Smoker    Packs/day: 0.25    Years: 47.00    Pack years: 11.75    Types: Cigarettes  . Smokeless tobacco: Never Used  . Tobacco comment: 3-4 cigarettes per day  Substance Use Topics  . Alcohol use: No    Alcohol/week: 0.0 oz    Comment: Quit alcohol in 1992; "never really drank much alcohol"    No Known Allergies  Current Outpatient Medications  Medication Sig Dispense Refill  . atorvastatin (LIPITOR) 80 MG tablet  TAKE 1 TABLET BY MOUTH ONCE DAILY AT 6 PM 30 tablet 4  . clopidogrel (PLAVIX) 75 MG tablet Take 1 tablet (75 mg total) by mouth daily. 30 tablet 3  . Fluticasone-Umeclidin-Vilant (TRELEGY ELLIPTA) 100-62.5-25 MCG/INH AEPB Inhale 1 puff into the lungs daily. 28 each 0  . ipratropium (ATROVENT HFA) 17 MCG/ACT inhaler Inhale 2 puffs into the lungs every 6 (six) hours as needed for wheezing. 1 Inhaler 12  . meloxicam (MOBIC) 15 MG tablet TAKE 1 TABLET BY MOUTH EVERY DAY AS NEEDED FOR FOOT PAIN.(TAKE WITH FOOD) 20 tablet 0  . pantoprazole (PROTONIX) 20 MG tablet Take 20 mg by mouth daily as needed for heartburn.    . promethazine  (PHENERGAN) 25 MG tablet take 1 tablet by mouth every 6 hours if needed for nausea and vomiting 30 tablet 2  . SPIRIVA HANDIHALER 18 MCG inhalation capsule Place 18 mcg into inhaler and inhale daily.  1  . VENTOLIN HFA 108 (90 Base) MCG/ACT inhaler INHALE 2 PUFFS BY MOUTH EVERY 4 HOURS IF NEEDED FOR WHEEZING OR SHORTNESS OF BREATH 18 g 0   No current facility-administered medications for this visit.     Review of Systems  Constitutional:  Constitutional negative. HENT: HENT negative.  Eyes: Eyes negative.  Respiratory: Respiratory negative.  Cardiovascular: Cardiovascular negative.  Musculoskeletal: Positive for leg pain.  Skin:       Black toes Neurological: Neurological negative. Hematologic: Hematologic/lymphatic negative.  Psychiatric: Psychiatric negative.        Objective:  Objective   Vitals:   02/05/18 1551  BP: (!) 175/95  Pulse: 69  Resp: 16  Temp: 97.8 F (36.6 C)  TempSrc: Oral  SpO2: 99%  Weight: 158 lb (71.7 kg)  Height: 5' 6.5" (1.689 m)   Body mass index is 25.12 kg/m.  Physical Exam  Constitutional: She appears well-developed.  HENT:  Head: Normocephalic.  Eyes: Pupils are equal, round, and reactive to light.  Neck: Normal range of motion. Neck supple.  Cardiovascular: Normal rate.  Pulses:      Femoral pulses are 2+ on the right side, and 2+ on the left side. No bilateral popliteal pulses.  Her signals are very dampened both sides DP and PT.  Abdominal: Soft. She exhibits no mass.  Musculoskeletal: Normal range of motion. She exhibits no edema.  Neurological: She is alert.  Skin: Capillary refill takes less than 2 seconds.  Psychiatric: She has a normal mood and affect. Her behavior is normal. Judgment and thought content normal.    Data: I have independently interpreted her bilateral lower extremity ABIs which are 0.42 right 0.43 left.  Her toe pressure on the right is 0 on the left is TBI 0.23.     Assessment/Plan:     67 year old  female previously had intervention on her right lower extremity and heel to wound.  That intervention appears to be occluded at this time and is likely she would need a femoropopliteal bypass for that foot if needed.  She however is having left foot rest pain but this time with an ABI of 0.4 bilaterally.  We will proceed with aortogram bilateral lower extremity runoff and plan to intervene on the left.  She will continue Plavix.  I have counseled her on smoking cessation but at this time she is not ready.  We will get her set up for the very near future.     Waynetta Sandy MD [facility]

## 2018-02-09 ENCOUNTER — Encounter: Payer: Self-pay | Admitting: Internal Medicine

## 2018-02-10 ENCOUNTER — Encounter (HOSPITAL_COMMUNITY): Payer: Self-pay | Admitting: Vascular Surgery

## 2018-02-10 ENCOUNTER — Other Ambulatory Visit: Payer: Self-pay

## 2018-02-10 ENCOUNTER — Encounter (HOSPITAL_COMMUNITY)
Admission: RE | Disposition: A | Discharge status: Home / Self Care | Payer: Self-pay | Source: Ambulatory Visit | Attending: Vascular Surgery

## 2018-02-10 ENCOUNTER — Observation Stay (HOSPITAL_COMMUNITY)
Admission: RE | Admit: 2018-02-10 | Discharge: 2018-02-12 | Disposition: A | Discharge status: Home / Self Care | Payer: Medicare HMO | Source: Ambulatory Visit | Attending: Vascular Surgery | Admitting: Vascular Surgery

## 2018-02-10 DIAGNOSIS — Z90711 Acquired absence of uterus with remaining cervical stump: Secondary | ICD-10-CM | POA: Diagnosis not present

## 2018-02-10 DIAGNOSIS — Z8249 Family history of ischemic heart disease and other diseases of the circulatory system: Secondary | ICD-10-CM | POA: Diagnosis not present

## 2018-02-10 DIAGNOSIS — I70223 Atherosclerosis of native arteries of extremities with rest pain, bilateral legs: Secondary | ICD-10-CM | POA: Insufficient documentation

## 2018-02-10 DIAGNOSIS — Z9889 Other specified postprocedural states: Secondary | ICD-10-CM | POA: Diagnosis not present

## 2018-02-10 DIAGNOSIS — Z86010 Personal history of colonic polyps: Secondary | ICD-10-CM | POA: Insufficient documentation

## 2018-02-10 DIAGNOSIS — F17210 Nicotine dependence, cigarettes, uncomplicated: Secondary | ICD-10-CM | POA: Diagnosis not present

## 2018-02-10 DIAGNOSIS — I998 Other disorder of circulatory system: Principal | ICD-10-CM | POA: Insufficient documentation

## 2018-02-10 DIAGNOSIS — I739 Peripheral vascular disease, unspecified: Secondary | ICD-10-CM | POA: Diagnosis present

## 2018-02-10 DIAGNOSIS — K219 Gastro-esophageal reflux disease without esophagitis: Secondary | ICD-10-CM | POA: Insufficient documentation

## 2018-02-10 DIAGNOSIS — H269 Unspecified cataract: Secondary | ICD-10-CM | POA: Insufficient documentation

## 2018-02-10 DIAGNOSIS — M654 Radial styloid tenosynovitis [de Quervain]: Secondary | ICD-10-CM | POA: Diagnosis not present

## 2018-02-10 DIAGNOSIS — Z79899 Other long term (current) drug therapy: Secondary | ICD-10-CM | POA: Diagnosis not present

## 2018-02-10 DIAGNOSIS — J449 Chronic obstructive pulmonary disease, unspecified: Secondary | ICD-10-CM | POA: Insufficient documentation

## 2018-02-10 DIAGNOSIS — Z7951 Long term (current) use of inhaled steroids: Secondary | ICD-10-CM | POA: Diagnosis not present

## 2018-02-10 DIAGNOSIS — Z7901 Long term (current) use of anticoagulants: Secondary | ICD-10-CM | POA: Diagnosis not present

## 2018-02-10 HISTORY — DX: Other chronic pain: G89.29

## 2018-02-10 HISTORY — DX: Low back pain, unspecified: M54.50

## 2018-02-10 HISTORY — DX: Low back pain: M54.5

## 2018-02-10 HISTORY — PX: ABDOMINAL AORTOGRAM W/LOWER EXTREMITY: CATH118223

## 2018-02-10 HISTORY — PX: PERIPHERAL VASCULAR INTERVENTION: CATH118257

## 2018-02-10 LAB — CREATININE, SERUM
Creatinine, Ser: 0.82 mg/dL (ref 0.44–1.00)
GFR calc Af Amer: >60 mL/min (ref >60)

## 2018-02-10 LAB — POCT I-STAT, CHEM 8
BUN: 8 mg/dL (ref 6–20)
CHLORIDE: 110 mmol/L (ref 101–111)
CREATININE: 0.8 mg/dL (ref 0.44–1.00)
Calcium, Ion: 0.93 mmol/L — ABNORMAL LOW (ref 1.15–1.40)
GLUCOSE: 97 mg/dL (ref 65–99)
HCT: 37 % (ref 36.0–46.0)
Hemoglobin: 12.6 g/dL (ref 12.0–15.0)
POTASSIUM: 4.7 mmol/L (ref 3.5–5.1)
Sodium: 139 mmol/L (ref 135–145)
TCO2: 21 mmol/L — ABNORMAL LOW (ref 22–32)

## 2018-02-10 LAB — CBC
HEMATOCRIT: 36.6 % (ref 36.0–46.0)
HEMOGLOBIN: 11.6 g/dL — AB (ref 12.0–15.0)
MCH: 29 pg (ref 26.0–34.0)
MCHC: 31.7 g/dL (ref 30.0–36.0)
MCV: 91.5 fL (ref 78.0–100.0)
Platelets: 274 10*3/uL (ref 150–400)
RBC: 4 MIL/uL (ref 3.87–5.11)
RDW: 13.9 % (ref 11.5–15.5)
WBC: 8.4 10*3/uL (ref 4.0–10.5)

## 2018-02-10 LAB — POCT ACTIVATED CLOTTING TIME: Activated Clotting Time: 230 seconds

## 2018-02-10 SURGERY — ABDOMINAL AORTOGRAM W/LOWER EXTREMITY
Anesthesia: LOCAL

## 2018-02-10 MED ORDER — ALBUTEROL SULFATE (2.5 MG/3ML) 0.083% IN NEBU: 3.0000 mL | INHALATION_SOLUTION | Freq: Four times a day (QID) | RESPIRATORY_TRACT | Status: DC | PRN

## 2018-02-10 MED ORDER — CLOPIDOGREL BISULFATE 75 MG PO TABS
75.0000 mg | ORAL_TABLET | Freq: Every day | ORAL | Status: DC
  Administered 2018-02-10 – 2018-02-12 (×3): 75 mg via ORAL
  Filled 2018-02-10 (×3): qty 1

## 2018-02-10 MED ORDER — SODIUM CHLORIDE 0.9% FLUSH
3.0000 mL | Freq: Two times a day (BID) | INTRAVENOUS | Status: DC
  Administered 2018-02-10 – 2018-02-11 (×2): 3 mL via INTRAVENOUS

## 2018-02-10 MED ORDER — ACETAMINOPHEN 325 MG PO TABS: 650.0000 mg | ORAL_TABLET | ORAL | Status: DC | PRN

## 2018-02-10 MED ORDER — MIDAZOLAM HCL 2 MG/2ML IJ SOLN
INTRAMUSCULAR | Status: DC | PRN
  Administered 2018-02-10 (×2): 1 mg via INTRAVENOUS

## 2018-02-10 MED ORDER — ONDANSETRON HCL 4 MG/2ML IJ SOLN
INTRAMUSCULAR | Status: AC
  Filled 2018-02-10: qty 2

## 2018-02-10 MED ORDER — FLUTICASONE FUROATE-VILANTEROL 100-25 MCG/INH IN AEPB
1.0000 | INHALATION_SPRAY | Freq: Every day | RESPIRATORY_TRACT | Status: DC
  Administered 2018-02-11 – 2018-02-12 (×2): 1 via RESPIRATORY_TRACT
  Filled 2018-02-10: qty 28

## 2018-02-10 MED ORDER — HEPARIN SODIUM (PORCINE) 5000 UNIT/ML IJ SOLN
5000.0000 [IU] | Freq: Three times a day (TID) | INTRAMUSCULAR | Status: DC
  Administered 2018-02-10 – 2018-02-12 (×5): 5000 [IU] via SUBCUTANEOUS
  Filled 2018-02-10 (×4): qty 1

## 2018-02-10 MED ORDER — PROMETHAZINE HCL 25 MG PO TABS
25.0000 mg | ORAL_TABLET | Freq: Four times a day (QID) | ORAL | Status: DC | PRN
  Administered 2018-02-10 – 2018-02-11 (×2): 25 mg via ORAL
  Filled 2018-02-10 (×2): qty 1

## 2018-02-10 MED ORDER — FLUTICASONE-UMECLIDIN-VILANT 100-62.5-25 MCG/INH IN AEPB: 1.0000 | INHALATION_SPRAY | Freq: Every day | RESPIRATORY_TRACT | Status: DC

## 2018-02-10 MED ORDER — SODIUM CHLORIDE 0.9 % IV SOLN: INTRAVENOUS | Status: AC

## 2018-02-10 MED ORDER — ASPIRIN EC 81 MG PO TBEC
81.0000 mg | DELAYED_RELEASE_TABLET | Freq: Every day | ORAL | Status: DC
  Administered 2018-02-10 – 2018-02-12 (×3): 81 mg via ORAL
  Filled 2018-02-10 (×4): qty 1

## 2018-02-10 MED ORDER — SODIUM CHLORIDE 0.9 % IV SOLN
INTRAVENOUS | Status: DC
  Administered 2018-02-10: 08:00:00 via INTRAVENOUS

## 2018-02-10 MED ORDER — HEPARIN SODIUM (PORCINE) 1000 UNIT/ML IJ SOLN
INTRAMUSCULAR | Status: AC
  Filled 2018-02-10: qty 1

## 2018-02-10 MED ORDER — FENTANYL CITRATE (PF) 100 MCG/2ML IJ SOLN
INTRAMUSCULAR | Status: AC
  Filled 2018-02-10: qty 2

## 2018-02-10 MED ORDER — ONDANSETRON HCL 4 MG/2ML IJ SOLN
4.0000 mg | Freq: Four times a day (QID) | INTRAMUSCULAR | Status: DC | PRN
  Administered 2018-02-10 – 2018-02-11 (×4): 4 mg via INTRAVENOUS
  Filled 2018-02-10 (×4): qty 2

## 2018-02-10 MED ORDER — TIOTROPIUM BROMIDE MONOHYDRATE 18 MCG IN CAPS
18.0000 ug | ORAL_CAPSULE | Freq: Every day | RESPIRATORY_TRACT | Status: DC
  Filled 2018-02-10: qty 5

## 2018-02-10 MED ORDER — MIDAZOLAM HCL 2 MG/2ML IJ SOLN
INTRAMUSCULAR | Status: AC
  Filled 2018-02-10: qty 2

## 2018-02-10 MED ORDER — LABETALOL HCL 5 MG/ML IV SOLN
10.0000 mg | INTRAVENOUS | Status: DC | PRN
  Administered 2018-02-10: 10 mg via INTRAVENOUS

## 2018-02-10 MED ORDER — HYDRALAZINE HCL 20 MG/ML IJ SOLN
INTRAMUSCULAR | Status: AC
  Filled 2018-02-10: qty 1

## 2018-02-10 MED ORDER — SODIUM CHLORIDE 0.9 % IV SOLN
250.0000 mL | INTRAVENOUS | Status: DC | PRN
Start: 2018-02-10 — End: 2018-02-12

## 2018-02-10 MED ORDER — UMECLIDINIUM BROMIDE 62.5 MCG/INH IN AEPB
1.0000 | INHALATION_SPRAY | Freq: Every day | RESPIRATORY_TRACT | Status: DC
  Administered 2018-02-11 – 2018-02-12 (×2): 1 via RESPIRATORY_TRACT
  Filled 2018-02-10: qty 7

## 2018-02-10 MED ORDER — FENTANYL CITRATE (PF) 100 MCG/2ML IJ SOLN
INTRAMUSCULAR | Status: DC | PRN
  Administered 2018-02-10 (×3): 25 ug via INTRAVENOUS

## 2018-02-10 MED ORDER — HYDRALAZINE HCL 20 MG/ML IJ SOLN
5.0000 mg | INTRAMUSCULAR | Status: DC | PRN
  Administered 2018-02-10: 5 mg via INTRAVENOUS

## 2018-02-10 MED ORDER — ENSURE ENLIVE PO LIQD: 237.0000 mL | Freq: Two times a day (BID) | ORAL | Status: DC

## 2018-02-10 MED ORDER — SODIUM CHLORIDE 0.9% FLUSH: 3.0000 mL | INTRAVENOUS | Status: DC | PRN

## 2018-02-10 MED ORDER — LIDOCAINE HCL (PF) 1 % IJ SOLN
INTRAMUSCULAR | Status: AC
  Filled 2018-02-10: qty 30

## 2018-02-10 MED ORDER — LIDOCAINE HCL (PF) 1 % IJ SOLN
INTRAMUSCULAR | Status: DC | PRN
  Administered 2018-02-10: 20 mL

## 2018-02-10 MED ORDER — HEPARIN SODIUM (PORCINE) 1000 UNIT/ML IJ SOLN
INTRAMUSCULAR | Status: DC | PRN
  Administered 2018-02-10: 3000 [IU] via INTRAVENOUS
  Administered 2018-02-10: 7000 [IU] via INTRAVENOUS

## 2018-02-10 MED ORDER — ASPIRIN EC 81 MG PO TBEC: 81.0000 mg | DELAYED_RELEASE_TABLET | Freq: Every day | ORAL | Status: DC

## 2018-02-10 MED ORDER — IODIXANOL 320 MG/ML IV SOLN
INTRAVENOUS | Status: DC | PRN
  Administered 2018-02-10: 170 mL via INTRA_ARTERIAL

## 2018-02-10 MED ORDER — LABETALOL HCL 5 MG/ML IV SOLN
INTRAVENOUS | Status: AC
  Filled 2018-02-10: qty 4

## 2018-02-10 MED ORDER — HEPARIN (PORCINE) IN NACL 1000-0.9 UT/500ML-% IV SOLN
INTRAVENOUS | Status: AC
  Filled 2018-02-10: qty 1000

## 2018-02-10 MED ORDER — HEPARIN (PORCINE) IN NACL 2-0.9 UNITS/ML
INTRAMUSCULAR | Status: AC | PRN
  Administered 2018-02-10 (×2): 500 mL via INTRA_ARTERIAL

## 2018-02-10 MED ORDER — TRAMADOL HCL 50 MG PO TABS
50.0000 mg | ORAL_TABLET | Freq: Four times a day (QID) | ORAL | Status: DC | PRN
  Administered 2018-02-11 – 2018-02-12 (×2): 50 mg via ORAL
  Filled 2018-02-10 (×2): qty 1

## 2018-02-10 MED ORDER — ATORVASTATIN CALCIUM 20 MG PO TABS
20.0000 mg | ORAL_TABLET | Freq: Every day | ORAL | Status: DC
  Administered 2018-02-10 – 2018-02-11 (×2): 20 mg via ORAL
  Filled 2018-02-10 (×2): qty 1

## 2018-02-10 SURGICAL SUPPLY — 25 items
BALLN STERLING OTW 3X220X150 (BALLOONS) ×3
BALLN STERLING OTW 5X220X150 (BALLOONS) ×3
BALLOON STERLING OTW 3X220X150 (BALLOONS) IMPLANT
BALLOON STERLING OTW 5X220X150 (BALLOONS) IMPLANT
CATH OMNI FLUSH 5F 65CM (CATHETERS) ×1 IMPLANT
CATH QUICKCROSS .035X135CM (MICROCATHETER) ×1 IMPLANT
CATH QUICKCROSS ANG SELECT (CATHETERS) ×1 IMPLANT
COVER PRB 48X5XTLSCP FOLD TPE (BAG) IMPLANT
COVER PROBE 5X48 (BAG) ×3
DEVICE CLOSURE MYNXGRIP 6/7F (Vascular Products) ×1 IMPLANT
DEVICE TORQUE .025-.038 (MISCELLANEOUS) ×1 IMPLANT
GUIDEWIRE ANGLED .035X260CM (WIRE) ×1 IMPLANT
KIT ENCORE 26 ADVANTAGE (KITS) ×1 IMPLANT
KIT MICROPUNCTURE NIT STIFF (SHEATH) ×1 IMPLANT
KIT PV (KITS) ×3 IMPLANT
SHEATH AVANTI 11CM 5FR (SHEATH) ×1 IMPLANT
SHEATH FLEXOR ANSEL 1 7F 45CM (SHEATH) ×1 IMPLANT
SHEATH PINNACLE 7F 10CM (SHEATH) ×1 IMPLANT
STENT ELUVIA 6X120X130 (Permanent Stent) ×2 IMPLANT
STENT ELUVIA 6X60X130 (Permanent Stent) ×1 IMPLANT
SYR MEDRAD MARK V 150ML (SYRINGE) ×3 IMPLANT
TRANSDUCER W/STOPCOCK (MISCELLANEOUS) ×3 IMPLANT
TRAY PV CATH (CUSTOM PROCEDURE TRAY) ×3 IMPLANT
WIRE BENTSON .035X145CM (WIRE) ×1 IMPLANT
WIRE G V18X300CM (WIRE) ×1 IMPLANT

## 2018-02-10 NOTE — H&P (Signed)
   History and Physical Update  The patient was interviewed and re-examined.  The patient's previous History and Physical has been reviewed and is unchanged from recent office visit. Plan for aortogram with bilateral runoff and possible intervention on the left.   Brandon C. Donzetta Matters, MD [facility] [place] Office: [phone] Pager: [phone]   02/10/2018, 7:28 AM

## 2018-02-10 NOTE — Op Note (Signed)
    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 Donzetta Matters, MD Procedure Performed: 1.  Ultrasound-guided cannulation right common femoral artery   2.  aortogram bilateral lower extremity runoff 3.  Balloon angioplasty left popliteal artery with 3 mm balloon  4.  Stent of left SFA with 6x118mm elluvia x 2 and 6x51mm proximal 5.  Moderate sedation with fentanyl and Versed for 108 minutes 6.  Minx grip device closure of right common femoral artery   Indications: 67 year old female with a history of bilateral lower extremity arterial disease and has a previous stent in her right SFA which is now occluded.  Her left side now has rest pain with ABI that is severely depressed.  She is indicated for aortogram bilateral lower extremity runoff possible intervention on the left.  Findings: The aorta and iliac segments have non-flow-limiting calcifications.  On the right side the proximal SFA stent is occluded reconstitutes and above the knee popliteal artery with three-vessel runoff.  On the left side the SFA occludes after several centimeters of patency reconstitutes above the knee SFA then there is a popliteal artery which is almost 95% stenosed just above the knee and three-vessel runoff to the foot.  At completion stents are patent in the left SFA with no flow-limiting stenosis or dissection in the popliteal artery has no flow-limiting stenosis or dissection were once it was nearly occluded.   Procedure:  The patient was identified in the holding area and taken to room 8.  She was placed supine on the table monitoring devices were placed.  She was sterilely prepped and draped in her bilateral groins timeout was called.  We began with instillation 1% lidocaine in the right groin.  Ultrasound was then used and identified a patent common  femoral artery could also see the stent on the right SFA which did not appear patent.  An image was saved to the patient's record.  This was cannulated under direct guidance micropuncture needle and a wire sheath were placed.  We then exchanged for a Bentson wire and placed a short 5 [place] sheath.  Angiogram with bilateral lower extremity runoff was then performed with the above findings.  We then crossed the bifurcation with a Omni Flush catheter and then placed a long 7 French sheath and the patient was heparinized.  A Glidewire and quick cross catheter were used to cross the occluded SFA.  We then used a V 18 and quick cross select to get across the nearly occluded popliteal artery.  All this was then ballooned with a 3 mm balloon.  Popliteal artery now looked good.  The SFA had multiple dissections and it was then ballooned with a 5 mm balloon continue to have dissection throughout.  This we elected to stent and we started with a 6 x 120 distally followed that with another one proximally and then placed a 6 x 60 at the most proximal aspect of the SFA.  These were all then dilated with 5 mm balloon.  Completion demonstrated brisk flow no residual stenosis or dissection and there is three-vessel runoff to the ankle.  She tolerated this procedure well without immediate complication.  Contrast: 170cc   Pieper Kasik C. Donzetta Matters, MD [facility] [place] Office: [phone] Pager: [phone]

## 2018-02-10 NOTE — Progress Notes (Signed)
Patient has been very nauseated and vomiting since her arrival from PACU.  PRN Zofran and phenergan administered.  Patient continues to experience nausea, vomiting and inability to take p.o. meds at this time.  Will continue to monitor.

## 2018-02-10 NOTE — Progress Notes (Signed)
Patient arrived to 4E room 19 from PACU after left leg stenting.  Telemetry monitor applied and CCMD notified.  Patient nauseated and vomiting on arrival.  PRN nausea med administered.  Patient oriented to unit and room to include call light and phone.  Will continue to monitor.

## 2018-02-11 DIAGNOSIS — J449 Chronic obstructive pulmonary disease, unspecified: Secondary | ICD-10-CM | POA: Diagnosis not present

## 2018-02-11 DIAGNOSIS — I70223 Atherosclerosis of native arteries of extremities with rest pain, bilateral legs: Secondary | ICD-10-CM | POA: Diagnosis not present

## 2018-02-11 DIAGNOSIS — Z86010 Personal history of colonic polyps: Secondary | ICD-10-CM | POA: Diagnosis not present

## 2018-02-11 DIAGNOSIS — F17210 Nicotine dependence, cigarettes, uncomplicated: Secondary | ICD-10-CM | POA: Diagnosis not present

## 2018-02-11 DIAGNOSIS — Z8249 Family history of ischemic heart disease and other diseases of the circulatory system: Secondary | ICD-10-CM | POA: Diagnosis not present

## 2018-02-11 DIAGNOSIS — M654 Radial styloid tenosynovitis [de Quervain]: Secondary | ICD-10-CM | POA: Diagnosis not present

## 2018-02-11 DIAGNOSIS — H269 Unspecified cataract: Secondary | ICD-10-CM | POA: Diagnosis not present

## 2018-02-11 DIAGNOSIS — K219 Gastro-esophageal reflux disease without esophagitis: Secondary | ICD-10-CM | POA: Diagnosis not present

## 2018-02-11 DIAGNOSIS — I998 Other disorder of circulatory system: Secondary | ICD-10-CM | POA: Diagnosis not present

## 2018-02-11 LAB — CBC
HCT: 36.2 % (ref 36.0–46.0)
HEMOGLOBIN: 11.6 g/dL — AB (ref 12.0–15.0)
MCH: 28.9 pg (ref 26.0–34.0)
MCHC: 32 g/dL (ref 30.0–36.0)
MCV: 90.3 fL (ref 78.0–100.0)
Platelets: 262 10*3/uL (ref 150–400)
RBC: 4.01 MIL/uL (ref 3.87–5.11)
RDW: 13.8 % (ref 11.5–15.5)
WBC: 8.1 10*3/uL (ref 4.0–10.5)

## 2018-02-11 LAB — BASIC METABOLIC PANEL
ANION GAP: 13 (ref 5–15)
BUN: 8 mg/dL (ref 6–20)
CHLORIDE: 105 mmol/L (ref 101–111)
CO2: 23 mmol/L (ref 22–32)
Calcium: 9.4 mg/dL (ref 8.9–10.3)
Creatinine, Ser: 0.88 mg/dL (ref 0.44–1.00)
GFR calc non Af Amer: >60 mL/min (ref >60)
Glucose, Bld: 152 mg/dL — ABNORMAL HIGH (ref 65–99)
Potassium: 3.5 mmol/L (ref 3.5–5.1)
Sodium: 141 mmol/L (ref 135–145)

## 2018-02-11 MED ORDER — PROMETHAZINE HCL 25 MG/ML IJ SOLN
12.5000 mg | Freq: Four times a day (QID) | INTRAMUSCULAR | Status: DC | PRN
  Administered 2018-02-11 (×2): 12.5 mg via INTRAVENOUS
  Filled 2018-02-11 (×2): qty 1

## 2018-02-11 MED ORDER — BOOST / RESOURCE BREEZE PO LIQD CUSTOM
1.0000 | Freq: Three times a day (TID) | ORAL | Status: DC
  Administered 2018-02-11 – 2018-02-12 (×2): 1 via ORAL

## 2018-02-11 MED ORDER — ADULT MULTIVITAMIN W/MINERALS CH
1.0000 | ORAL_TABLET | Freq: Every day | ORAL | Status: DC
  Administered 2018-02-11 – 2018-02-12 (×2): 1 via ORAL
  Filled 2018-02-11 (×2): qty 1

## 2018-02-11 MED FILL — Heparin Sod (Porcine)-NaCl IV Soln 1000 Unit/500ML-0.9%: INTRAVENOUS | Qty: 1000 | Status: AC

## 2018-02-11 NOTE — Progress Notes (Addendum)
  Progress Note    02/11/2018 7:17 AM 1 Day Post-Op  Subjective:  "I had a terrible night, I was throwing up" Says she just started feeling better this morning at 6.  Feels like she needs another day.  Tm 99.2  HR  80's-100's  124'P-809'X systolic 83% RA  Vitals:   02/10/18 1912 02/11/18 0449  BP: (!) 175/84 (!) 155/72  Pulse: 97 99  Resp: 15 20  Temp: 98.4 F (36.9 C) 99.2 F (37.3 C)  SpO2: 100% 100%    Physical Exam: Cardiac:  regular Lungs:  Non labored Incisions:  Right groin is soft without hematoma Extremities:  + palpable left PT pulse   CBC    Component Value Date/Time   WBC 8.1 02/11/2018 0437   RBC 4.01 02/11/2018 0437   HGB 11.6 (L) 02/11/2018 0437   HCT 36.2 02/11/2018 0437   PLT 262 02/11/2018 0437   MCV 90.3 02/11/2018 0437   MCH 28.9 02/11/2018 0437   MCHC 32.0 02/11/2018 0437   RDW 13.8 02/11/2018 0437   LYMPHSABS 1.2 09/15/2017 1021   MONOABS 0.2 09/15/2017 1021   EOSABS 0.0 09/15/2017 1021   BASOSABS 0.0 09/15/2017 1021    BMET    Component Value Date/Time   NA 141 02/11/2018 0437   K 3.5 02/11/2018 0437   CL 105 02/11/2018 0437   CO2 23 02/11/2018 0437   GLUCOSE 152 (H) 02/11/2018 0437   BUN 8 02/11/2018 0437   CREATININE 0.88 02/11/2018 0437   CREATININE 0.79 08/15/2013 1446   CALCIUM 9.4 02/11/2018 0437   GFRNONAA >60 02/11/2018 0437   GFRAA >60 02/11/2018 0437    INR No results found for: INR   Intake/Output Summary (Last 24 hours) at 02/11/2018 0717 Last data filed at 02/10/2018 2200 Gross per 24 hour  Intake 120 ml  Output -  Net 120 ml     Assessment:  67 y.o. female is s/p:  1.  Ultrasound-guided cannulation right common femoral artery   2.  aortogram bilateral lower extremity runoff 3.  Balloon angioplasty left popliteal artery with 3 mm balloon  4.  Stent of left SFA with 6x151mm elluvia x 2 and 6x86mm proximal 5.  Moderate sedation with fentanyl and Versed for 108 minutes 6.  Minx grip device closure of  right common femoral artery  1 Day Post-Op  Plan: -pt with palpable left PT pulse -pt with N/V last evening and feeling a little better this am-feels she needs another day -needs to mobilize out of bed   Leontine Locket, PA-C [facility] [phone] 02/11/2018 7:17 AM  I have independently interviewed and examined patient and agree with PA assessment and plan above. Palpable left pt and foot pain has improved. Has persistent nausea and will continue hydration.  Brandon C. Donzetta Matters, MD [facility] [place] Office: [phone] Pager: [phone]

## 2018-02-11 NOTE — Progress Notes (Signed)
Pt requested L wrist IV to be taken out, IV occluded and also hurting. Removed and pt refused another IV placement for now unless needed for phenergan admin. Currently not nauseated, will continue to monitor.   Jaymes Graff, RN

## 2018-02-11 NOTE — Progress Notes (Signed)
Initial Nutrition Assessment  DOCUMENTATION CODES:   Not applicable  INTERVENTION:   -D/c Ensure Enlive po BID, each supplement provides 350 kcal and 20 grams of protein -Boost Breeze po TID, each supplement provides 250 kcal and 9 grams of protein -MVI with minerals daily  NUTRITION DIAGNOSIS:   Inadequate oral intake related to decreased appetite, nausea as evidenced by per patient/family report, meal completion < 25%.  GOAL:   Patient will meet greater than or equal to 90% of their needs  MONITOR:   PO intake, Supplement acceptance, Labs, Weight trends, Skin, I & O's  REASON FOR ASSESSMENT:   Malnutrition Screening Tool    ASSESSMENT:   67 year old female previously had intervention on her right lower extremity and heel to wound.  That intervention appears to be occluded at this time and is likely she would need a femoropopliteal bypass for that foot if needed.  She however is having left foot rest pain but this time with an ABI of 0.4 bilaterally.  We will proceed with aortogram bilateral lower extremity runoff and plan to intervene on the left.   6/12- s/p:Ultrasound-guided cannulation right common femoral artery; aortogram bilateral lower extremity runoff; balloon angioplasty left popliteal artery with 3 mm balloon; stent of left SFA with6x17mm elluvia x 2 and 6x25mm proximal; moderate sedation with fentanyl and Versedfor 108 minutes; minx grip device closure of right common femoral artery   Case discussed with RN prior to visit, who reports pt with poor appetite due to nausea. Pt was recently given phenergan.   Spoke with pt at bedside, who endorses poor appetite and weight loss for approximately one month. She shares that she had been suffering from h-pylori earlier this month and received a medication (unsure of name) which "made me sick to my stomach". Noted breakfast tray at bedside; pt did not consume any solid food (eggs, toast, sausage), but consumed a few sips  of fruit juice and 75% of can of gingerale. Pt reports that she has been tolerating liquids well.   Pt shares that she typically consumes 2 meals per day; she really enjoys eating breakfast food and will often consume multiple times per day. Breakfast is usually and sausage and egg biscuit and dinner is usually scrambled eggs, breakfast meat, and toast. She shares her intake has significantly declined over the past 2 weeks. She estimates that she has lost over 10# within the past month. She reports UBW of around 198#. However, this is not consistent with wt hx, which reveals wt stability over > 1 year. Verified wt on bedscale of 73.6 kg (161.9 #).   Pt denies any changes in mobility; was active and independent PTA. Noted some mild muscle depletion in lower extremities. Per pt, she has always had thin legs and legs do not appear physically different from baseline.   Discussed importance of good nutritional intake to promote healing. Pt does not take supplements at home, but amenable to try Boost Breeze secondary to nausea.   Labs reviewed.   NUTRITION - FOCUSED PHYSICAL EXAM:    Most Recent Value  Orbital Region  No depletion  Upper Arm Region  Mild depletion  Thoracic and Lumbar Region  No depletion  Buccal Region  No depletion  Temple Region  No depletion  Clavicle Bone Region  No depletion  Clavicle and Acromion Bone Region  No depletion  Scapular Bone Region  No depletion  Dorsal Hand  No depletion  Patellar Region  Mild depletion  Anterior Thigh Region  Mild depletion  Posterior Calf Region  Mild depletion  Edema (RD Assessment)  None  Hair  Reviewed  Eyes  Reviewed  Mouth  Reviewed  Skin  Reviewed  Nails  Reviewed       Diet Order:   Diet Order           Diet regular Room service appropriate? Yes; Fluid consistency: Thin  Diet effective now          EDUCATION NEEDS:   Education needs have been addressed  Skin:  Skin Assessment: Reviewed RN Assessment  Last BM:   02/09/18  Height:   Ht Readings from Last 1 Encounters:  02/10/18 5' 6.5" (1.689 m)    Weight:   Wt Readings from Last 1 Encounters:  02/10/18 155 lb (70.3 kg)    Ideal Body Weight:  60.5 kg  BMI:  Body mass index is 24.64 kg/m.  Estimated Nutritional Needs:   Kcal:  7035-0093  Protein:  90-105 grams  Fluid:  1.7-1.9 L    Alvis Pulcini A. Jimmye Norman, RD, LDN, CDE Pager: [phone] After hours Pager: [phone]

## 2018-02-12 ENCOUNTER — Telehealth: Payer: Self-pay | Admitting: Vascular Surgery

## 2018-02-12 DIAGNOSIS — K219 Gastro-esophageal reflux disease without esophagitis: Secondary | ICD-10-CM | POA: Diagnosis not present

## 2018-02-12 DIAGNOSIS — J449 Chronic obstructive pulmonary disease, unspecified: Secondary | ICD-10-CM | POA: Diagnosis not present

## 2018-02-12 DIAGNOSIS — Z8249 Family history of ischemic heart disease and other diseases of the circulatory system: Secondary | ICD-10-CM | POA: Diagnosis not present

## 2018-02-12 DIAGNOSIS — I70223 Atherosclerosis of native arteries of extremities with rest pain, bilateral legs: Secondary | ICD-10-CM | POA: Diagnosis not present

## 2018-02-12 DIAGNOSIS — M654 Radial styloid tenosynovitis [de Quervain]: Secondary | ICD-10-CM | POA: Diagnosis not present

## 2018-02-12 DIAGNOSIS — F17210 Nicotine dependence, cigarettes, uncomplicated: Secondary | ICD-10-CM | POA: Diagnosis not present

## 2018-02-12 DIAGNOSIS — I998 Other disorder of circulatory system: Secondary | ICD-10-CM | POA: Diagnosis not present

## 2018-02-12 DIAGNOSIS — Z86010 Personal history of colonic polyps: Secondary | ICD-10-CM | POA: Diagnosis not present

## 2018-02-12 DIAGNOSIS — H269 Unspecified cataract: Secondary | ICD-10-CM | POA: Diagnosis not present

## 2018-02-12 NOTE — Progress Notes (Signed)
  Progress Note    02/12/2018 8:09 AM 2 Days Post-Op  Subjective:  Feeling better; no more nausea  Tm 99.4 HR 80's-100's NSR 025'K-270'W systolic 23% RA  Vitals:   02/12/18 0402 02/12/18 0736  BP: 135/86   Pulse: 89   Resp: (!) 22   Temp: 99.4 F (37.4 C)   SpO2:  94%    Physical Exam: Cardiac:  regular Lungs:  Non labored Incisions:  Right groin is soft without hematoma Extremities:  Brisk doppler signal left PT/DP   CBC    Component Value Date/Time   WBC 8.1 02/11/2018 0437   RBC 4.01 02/11/2018 0437   HGB 11.6 (L) 02/11/2018 0437   HCT 36.2 02/11/2018 0437   PLT 262 02/11/2018 0437   MCV 90.3 02/11/2018 0437   MCH 28.9 02/11/2018 0437   MCHC 32.0 02/11/2018 0437   RDW 13.8 02/11/2018 0437   LYMPHSABS 1.2 09/15/2017 1021   MONOABS 0.2 09/15/2017 1021   EOSABS 0.0 09/15/2017 1021   BASOSABS 0.0 09/15/2017 1021    BMET    Component Value Date/Time   NA 141 02/11/2018 0437   K 3.5 02/11/2018 0437   CL 105 02/11/2018 0437   CO2 23 02/11/2018 0437   GLUCOSE 152 (H) 02/11/2018 0437   BUN 8 02/11/2018 0437   CREATININE 0.88 02/11/2018 0437   CREATININE 0.79 08/15/2013 1446   CALCIUM 9.4 02/11/2018 0437   GFRNONAA >60 02/11/2018 0437   GFRAA >60 02/11/2018 0437    INR No results found for: INR   Intake/Output Summary (Last 24 hours) at 02/12/2018 0809 Last data filed at 02/11/2018 0920 Gross per 24 hour  Intake 50 ml  Output -  Net 50 ml     Assessment:  67 y.o. female is s/p:  1.Ultrasound-guided cannulation right common femoral artery 2.aortogram bilateral lower extremity runoff 3.Balloon angioplasty left popliteal artery with 3 mm balloon 4.Stent of left SFA with6x19mm elluvia x 2 and 6x36mm proximal 5.Moderate sedation with fentanyl and Versedfor 108 minutes 6.Minx grip device closure of right common femoral artery   2 Days Post-Op  Plan: -pt feeling better this morning and has not had any more nausea -brisk  doppler signals left DP/PT  -right groin is soft without hematoma -ambulate pt this morning and plan for discharge    Leontine Locket, PA-C [facility] [phone] 02/12/2018 8:09 AM

## 2018-02-12 NOTE — Progress Notes (Signed)
02/12/2018 12:12 PM Discharge AVS meds taken today and those due this evening reviewed.  Follow-up appointments and when to call md reviewed.  D/C IV and TELE.  Questions and concerns addressed.   D/C home per orders. [place]

## 2018-02-12 NOTE — Discharge Summary (Signed)
Discharge Summary    [name] [birth year] 67 y.o. female  [id number]  Admission Date: 02/10/2018  Discharge Date: 02/12/18  Physician: Thomes Lolling*  Admission Diagnosis: PAD (peripheral artery disease) ([place]) [I73.9]   HPI:   This is a 67 y.o. female with a history of bilateral lower extremity arterial disease and has a previous stent in her right SFA which is now occluded.  Her left side now has rest pain with ABI that is severely depressed.  She is indicated for aortogram bilateral lower extremity runoff possible intervention on the left.   Hospital Course:  The patient was admitted to the hospital and taken to the operating room on 02/10/2018 and underwent: 1.  Ultrasound-guided cannulation right common femoral artery   2.  aortogram bilateral lower extremity runoff 3.  Balloon angioplasty left popliteal artery with 3 mm balloon  4.  Stent of left SFA with 6x114mm elluvia x 2 and 6x22mm proximal 5.  Moderate sedation with fentanyl and Versed for 108 minutes 6.  Minx grip device closure of right common femoral artery  Findings: The aorta and iliac segments have non-flow-limiting calcifications.  On the right side the proximal SFA stent is occluded reconstitutes and above the knee popliteal artery with three-vessel runoff.  On the left side the SFA occludes after several centimeters of patency reconstitutes above the knee SFA then there is a popliteal artery which is almost 95% stenosed just above the knee and three-vessel runoff to the foot.   The pt tolerated the procedure well and was transported to the PACU in good condition.   By POD 1, she had good flow to her left foot.  She had been nauseated all night as well as vomiting.  She was kept an additional day for IV hydration.    On POD 2, she was feeling much better and had brisk doppler signals to the left PT/DP.  Her right groin is soft without hematoma.  She is discharged home.   The remainder of the  hospital course consisted of increasing mobilization and increasing intake of solids without difficulty.  CBC    Component Value Date/Time   WBC 8.1 02/11/2018 0437   RBC 4.01 02/11/2018 0437   HGB 11.6 (L) 02/11/2018 0437   HCT 36.2 02/11/2018 0437   PLT 262 02/11/2018 0437   MCV 90.3 02/11/2018 0437   MCH 28.9 02/11/2018 0437   MCHC 32.0 02/11/2018 0437   RDW 13.8 02/11/2018 0437   LYMPHSABS 1.2 09/15/2017 1021   MONOABS 0.2 09/15/2017 1021   EOSABS 0.0 09/15/2017 1021   BASOSABS 0.0 09/15/2017 1021    BMET    Component Value Date/Time   NA 141 02/11/2018 0437   K 3.5 02/11/2018 0437   CL 105 02/11/2018 0437   CO2 23 02/11/2018 0437   GLUCOSE 152 (H) 02/11/2018 0437   BUN 8 02/11/2018 0437   CREATININE 0.88 02/11/2018 0437   CREATININE 0.79 08/15/2013 1446   CALCIUM 9.4 02/11/2018 0437   GFRNONAA >60 02/11/2018 0437   GFRAA >60 02/11/2018 0437      Discharge Instructions    Discharge patient   Complete by:  As directed    Discharge disposition:  01-Home or Self Care   Discharge patient date:  02/12/2018      Discharge Diagnosis:  PAD (peripheral artery disease) ([place]) [I73.9]  Secondary Diagnosis: Patient Active Problem List   Diagnosis Date Noted  . Prediabetes 10/27/2017  . PAD (peripheral artery disease) ([place]). left leg 09/17/2017  .  Aortic atherosclerosis ([place]) 09/16/2017  . Emphysema lung ([place]) 09/16/2017  . Uses marijuana 09/16/2017  . 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 Loss of weight   . Lumbar disc herniation   . Leg pain, medial 04/11/2016  . Orthopnea 09/28/2015  . Hypertensive retinopathy of right eye, grade 1 05/03/2014  . Hypertensive retinopathy of left eye, grade 2 05/03/2014  . Dyspnea 08/10/2013  . Protein-calorie malnutrition, severe ([place]) 05/11/2013  . Cyclical vomiting 38/93/7342  . Nausea & vomiting   . Tobacco abuse   . COPD exacerbation ([place]) 12/29/2006  .  GASTROESOPHAGEAL REFLUX DISEASE 12/29/2006  . HIATAL HERNIA WITH REFLUX 12/29/2006  . PSORIASIS 12/29/2006   Past Medical History:  Diagnosis Date  . Abdominal pain, epigastric 02/23/2013  . Abdominal pain, unspecified site 08/15/2013  . Abnormal neurological exam 04/11/2016  . Asthma   . Cataracts, bilateral 05/03/2014  . Chronic bronchitis ([place])   . Chronic lower back pain   . COPD (chronic obstructive pulmonary disease) ([place])   . Daily headache   . De Quervain's tenosynovitis, left 11/09/2014  . GERD (gastroesophageal reflux disease)   . Hx of colonic polyps   . Nausea & vomiting   . PSORIASIS 12/29/2006   Qualifier: Diagnosis of  By: Jobe Igo MD, Shanon Brow    . Right leg pain 04/02/2016  . Smoker      Allergies as of 02/12/2018   No Known Allergies     Medication List    TAKE these medications   aspirin EC 81 MG tablet Take 81 mg by mouth daily.   atorvastatin 80 MG tablet Commonly known as:  LIPITOR TAKE 1 TABLET BY MOUTH ONCE DAILY AT 6 PM   clopidogrel 75 MG tablet Commonly known as:  PLAVIX Take 1 tablet (75 mg total) by mouth daily.   Fluticasone-Umeclidin-Vilant 100-62.5-25 MCG/INH Aepb Commonly known as:  TRELEGY ELLIPTA Inhale 1 puff into the lungs daily.   ipratropium 17 MCG/ACT inhaler Commonly known as:  ATROVENT HFA Inhale 2 puffs into the lungs every 6 (six) hours as needed for wheezing.   meloxicam 15 MG tablet Commonly known as:  MOBIC TAKE 1 TABLET BY MOUTH EVERY DAY AS NEEDED FOR FOOT PAIN.(TAKE WITH FOOD)   promethazine 25 MG tablet Commonly known as:  PHENERGAN take 1 tablet by mouth every 6 hours if needed for nausea and vomiting   SPIRIVA HANDIHALER 18 MCG inhalation capsule Generic drug:  tiotropium Place 18 mcg into inhaler and inhale daily.   traMADol 50 MG tablet Commonly known as:  ULTRAM Take 50 mg by mouth every 6 (six) hours as needed for severe pain.   VENTOLIN HFA 108 (90 Base) MCG/ACT inhaler Generic drug:  albuterol INHALE 2  PUFFS BY MOUTH EVERY 4 HOURS IF NEEDED FOR WHEEZING OR SHORTNESS OF BREATH       Prescriptions given: none  Instructions:   [facility]  Discharge Instructions  Lower Extremity Angiogram; Angioplasty/Stenting  Please refer to the following instructions for your post-procedure care. Your surgeon or physician assistant will discuss any changes with you.  Activity  Avoid lifting more than 8 pounds (1 gallons of milk) for 72 hours (3 days) after your procedure. You may walk as much as you can tolerate. It's OK to drive after 72 hours.  Bathing/Showering  You may shower the day after your procedure. If you have a bandage, you may remove it at 24- 48 hours. Clean  your incision site with mild soap and water. Pat the area dry with a clean towel.  Diet  Resume your pre-procedure diet. There are no special food restrictions following this procedure. All patients with peripheral vascular disease should follow a low fat/low cholesterol diet. In order to heal from your surgery, it is CRITICAL to get adequate nutrition. Your body requires vitamins, minerals, and protein. Vegetables are the best source of vitamins and minerals. Vegetables also provide the perfect balance of protein. Processed food has little nutritional value, so try to avoid this.  Medications  Resume taking all of your medications unless your doctor tells you not to. If your incision is causing pain, you may take over-the-counter pain relievers such as acetaminophen (Tylenol)  Follow Up  Follow up will be arranged at the time of your procedure. You may have an office visit scheduled or may be scheduled for surgery. Ask your surgeon if you have any questions.  Please call us immediately for any of the following conditions: .Severe or worsening pain your legs or feet at rest or with walking. .Increased pain, redness, drainage at your groin puncture site. .Fever of 101 degrees or higher. .If  you have any mild or slow bleeding from your puncture site: lie down, apply firm constant pressure over the area with a piece of gauze or a clean wash cloth for 30 minutes- no peeking!, call 911 right away if you are still bleeding after 30 minutes, or if the bleeding is heavy and unmanageable.  Reduce your risk factors of vascular disease:  . Stop smoking. If you would like help call [facility] at 1-800-QUIT-NOW 419-410-3692) or [place] at 646-771-4958. . Manage your cholesterol . Maintain a desired weight . Control your diabetes . Keep your blood pressure down .  If you have any questions, please call the office at [phone]   Disposition: home  Patient's condition: is Good  Follow up: 1. Dr. Donzetta Matters in 4-6 weeks with abi's and LLE arterial duplex   Leontine Locket, PA-C [facility] [phone] 02/12/2018  8:24 AM

## 2018-02-12 NOTE — Discharge Instructions (Signed)
° °  [facility] [place] ° °Discharge Instructions ° °Lower Extremity Angiogram; Angioplasty/Stenting ° °Please refer to the following instructions for your post-procedure care. Your surgeon or physician assistant will discuss any changes with you. ° °Activity ° °Avoid lifting more than 8 pounds (1 gallons of milk) for 72 hours (3 days) after your procedure. You may walk as much as you can tolerate. It's OK to drive after 72 hours. ° °Bathing/Showering ° °You may shower the day after your procedure. If you have a bandage, you may remove it at 24- 48 hours. Clean your incision site with mild soap and water. Pat the area dry with a clean towel. ° °Diet ° °Resume your pre-procedure diet. There are no special food restrictions following this procedure. All patients with peripheral vascular disease should follow a low fat/low cholesterol diet. In order to heal from your surgery, it is CRITICAL to get adequate nutrition. Your body requires vitamins, minerals, and protein. Vegetables are the best source of vitamins and minerals. Vegetables also provide the perfect balance of protein. Processed food has little nutritional value, so try to avoid this. ° °Medications ° °Resume taking all of your medications unless your doctor tells you not to. If your incision is causing pain, you may take over-the-counter pain relievers such as acetaminophen (Tylenol) ° °Follow Up ° °Follow up will be arranged at the time of your procedure. You may have an office visit scheduled or may be scheduled for surgery. Ask your surgeon if you have any questions. ° °Please call us immediately for any of the following conditions: °•Severe or worsening pain your legs or feet at rest or with walking. °•Increased pain, redness, drainage at your groin puncture site. °•Fever of 101 degrees or higher. °•If you have any mild or slow bleeding from your puncture site: lie down, apply firm constant pressure over the area with a piece of  gauze or a clean wash cloth for 30 minutes- no peeking!, call 911 right away if you are still bleeding after 30 minutes, or if the bleeding is heavy and unmanageable. ° °Reduce your risk factors of vascular disease: ° °Stop smoking. If you would like help call [facility] at 1-800-QUIT-NOW (1-800-784-8669) or [place] at 336-586-4000. °Manage your cholesterol °Maintain a desired weight °Control your diabetes °Keep your blood pressure down ° °If you have any questions, please call the office at [phone] ° °

## 2018-02-12 NOTE — Telephone Encounter (Signed)
sch appt lvm 03/25/18 3pm ABI 4pm LE Art 03/26/18 845am p/o MD

## 2018-02-16 ENCOUNTER — Other Ambulatory Visit: Payer: Self-pay

## 2018-02-16 DIAGNOSIS — I739 Peripheral vascular disease, unspecified: Secondary | ICD-10-CM

## 2018-02-16 DIAGNOSIS — I70209 Unspecified atherosclerosis of native arteries of extremities, unspecified extremity: Secondary | ICD-10-CM

## 2018-02-16 DIAGNOSIS — L97302 Non-pressure chronic ulcer of unspecified ankle with fat layer exposed: Secondary | ICD-10-CM

## 2018-03-25 ENCOUNTER — Other Ambulatory Visit: Payer: Self-pay | Admitting: Internal Medicine

## 2018-03-25 ENCOUNTER — Ambulatory Visit (INDEPENDENT_AMBULATORY_CARE_PROVIDER_SITE_OTHER)
Admit: 2018-03-25 | Discharge: 2018-03-25 | Disposition: A | Discharge status: Home / Self Care | Payer: Medicare HMO | Attending: Vascular Surgery | Admitting: Vascular Surgery

## 2018-03-25 ENCOUNTER — Ambulatory Visit (HOSPITAL_COMMUNITY)
Admission: RE | Admit: 2018-03-25 | Discharge: 2018-03-25 | Disposition: A | Discharge status: Home / Self Care | Payer: Medicare HMO | Source: Ambulatory Visit | Attending: Vascular Surgery | Admitting: Vascular Surgery

## 2018-03-25 DIAGNOSIS — I739 Peripheral vascular disease, unspecified: Secondary | ICD-10-CM | POA: Diagnosis not present

## 2018-03-25 DIAGNOSIS — I6522 Occlusion and stenosis of left carotid artery: Secondary | ICD-10-CM | POA: Insufficient documentation

## 2018-03-25 DIAGNOSIS — I70209 Unspecified atherosclerosis of native arteries of extremities, unspecified extremity: Secondary | ICD-10-CM

## 2018-03-25 DIAGNOSIS — F17200 Nicotine dependence, unspecified, uncomplicated: Secondary | ICD-10-CM | POA: Insufficient documentation

## 2018-03-25 DIAGNOSIS — L97302 Non-pressure chronic ulcer of unspecified ankle with fat layer exposed: Secondary | ICD-10-CM | POA: Diagnosis not present

## 2018-03-26 ENCOUNTER — Encounter: Payer: Self-pay | Admitting: Vascular Surgery

## 2018-03-26 ENCOUNTER — Other Ambulatory Visit: Payer: Self-pay

## 2018-03-26 ENCOUNTER — Encounter: Payer: Self-pay | Admitting: *Deleted

## 2018-03-26 ENCOUNTER — Telehealth: Payer: Self-pay

## 2018-03-26 ENCOUNTER — Telehealth: Payer: Self-pay | Admitting: *Deleted

## 2018-03-26 ENCOUNTER — Other Ambulatory Visit: Payer: Self-pay | Admitting: *Deleted

## 2018-03-26 ENCOUNTER — Ambulatory Visit (INDEPENDENT_AMBULATORY_CARE_PROVIDER_SITE_OTHER): Payer: Medicare HMO | Admitting: Vascular Surgery

## 2018-03-26 VITALS — BP 137/88 | HR 82 | Temp 97.4°F | Resp 14 | Ht 66.5 in | Wt 154.0 lb

## 2018-03-26 DIAGNOSIS — I739 Peripheral vascular disease, unspecified: Secondary | ICD-10-CM | POA: Diagnosis not present

## 2018-03-26 DIAGNOSIS — Z01818 Encounter for other preprocedural examination: Secondary | ICD-10-CM

## 2018-03-26 DIAGNOSIS — I700 Atherosclerosis of aorta: Secondary | ICD-10-CM

## 2018-03-26 NOTE — Telephone Encounter (Signed)
Spoke with pt and went over recommendations and instructions for stress test.  Pt verbalized understanding and was in agreement with this plan.

## 2018-03-26 NOTE — Progress Notes (Signed)
Patient ID: [name], female   DOB: [birth year], 67 y.o.   MRN: [id number]  Reason for Consult: Routine Post Op   Referred by Lovenia Kim, MD  Subjective:     HPI:  [name] is a 67 y.o. female with history of bilateral lower extremity SFA stenting.  Most recently she had stents placed in early June.  This was done for rest pain.  Unfortunately her pain never improved.  She continues to smoke although she has cut down significantly.  She takes aspirin Plavix and statins.  Studies performed yesterday demonstrate occluded stents bilaterally.  States that her pain is at night in the in her forefoot and then when she walks she has cramping that severely limits her.  She does not have any tissue loss at this time.  Past Medical History:  Diagnosis Date  . Abdominal pain, epigastric 02/23/2013  . Abdominal pain, unspecified site 08/15/2013  . Abnormal neurological exam 04/11/2016  . Asthma   . Cataracts, bilateral 05/03/2014  . Chronic bronchitis ([place])   . Chronic lower back pain   . COPD (chronic obstructive pulmonary disease) ([place])   . Daily headache   . De Quervain's tenosynovitis, left 11/09/2014  . GERD (gastroesophageal reflux disease)   . Hx of colonic polyps   . Nausea & vomiting   . PSORIASIS 12/29/2006   Qualifier: Diagnosis of  By: Jobe Igo MD, Shanon Brow    . Right leg pain 04/02/2016  . Smoker    Family History  Problem Relation Age of Onset  . Cancer Other   . Coronary artery disease Other   . Cancer Mother   . Hypertension Mother   . Heart disease Mother   . Cancer Father   . Hypertension Father   . Cancer Sister   . Hypertension Sister    Past Surgical History:  Procedure Laterality Date  . ABDOMINAL AORTOGRAM W/LOWER EXTREMITY N/A 10/06/2016   Procedure: Abdominal Aortogram w/Lower Extremity;  Surgeon: Waynetta Sandy, MD;  Location: [facility];  Service: Cardiovascular;  Laterality: N/A;  . ABDOMINAL AORTOGRAM W/LOWER EXTREMITY N/A 02/10/2018   Procedure: ABDOMINAL AORTOGRAM W/LOWER EXTREMITY;  Surgeon: Waynetta Sandy, MD;  Location: [facility];  Service: Cardiovascular;  Laterality: N/A;  . ABDOMINAL HERNIA REPAIR    . APPENDECTOMY    . COLONOSCOPY W/ POLYPECTOMY  02/26/2005  . HERNIA REPAIR    . PERIPHERAL VASCULAR ATHERECTOMY Right 10/06/2016   Procedure: Peripheral Vascular Atherectomy;  Surgeon: Waynetta Sandy, MD;  Location: [facility];  Service: Cardiovascular;  Laterality: Right;  . PERIPHERAL VASCULAR INTERVENTION Right 10/06/2016   Procedure: Peripheral Vascular Intervention;  Surgeon: Waynetta Sandy, MD;  Location: [facility];  Service: Cardiovascular;  Laterality: Right;  SFA  . PERIPHERAL VASCULAR INTERVENTION Left 02/10/2018   Procedure: PERIPHERAL VASCULAR INTERVENTION;  Surgeon: Waynetta Sandy, MD;  Location: [facility];  Service: Cardiovascular;  Laterality: Left;  . TONSILLECTOMY  1960s  . UPPER GASTROINTESTINAL ENDOSCOPY  03/22/2010, 02/13/2005  . VAGINAL HYSTERECTOMY     partial    Short Social History:  Social History   Tobacco Use  . Smoking status: Current Every Day Smoker    Packs/day: 0.25    Years: 47.00    Pack years: 11.75    Types: Cigarettes  . Smokeless tobacco: Never Used  . Tobacco comment: 3-4 cigarettes per day  Substance Use Topics  . Alcohol use: Not Currently    Alcohol/week: 0.0 oz  Comment: Quit alcohol in 1992; "never really drank much alcohol"    No Known Allergies  Current Outpatient Medications  Medication Sig Dispense Refill  . aspirin EC 81 MG tablet Take 81 mg by mouth daily.    Marland Kitchen atorvastatin (LIPITOR) 80 MG tablet TAKE 1 TABLET BY MOUTH ONCE DAILY AT 6 PM 30 tablet 4  . clopidogrel (PLAVIX) 75 MG tablet Take 1 tablet (75 mg total) by mouth daily. 30 tablet 3  . Fluticasone-Umeclidin-Vilant (TRELEGY ELLIPTA) 100-62.5-25 MCG/INH AEPB Inhale 1 puff into the lungs daily. 28 each 0  . ipratropium  (ATROVENT HFA) 17 MCG/ACT inhaler Inhale 2 puffs into the lungs every 6 (six) hours as needed for wheezing. 1 Inhaler 12  . meloxicam (MOBIC) 15 MG tablet TAKE 1 TABLET BY MOUTH EVERY DAY AS NEEDED FOR FOOT PAIN. TAKE WITH FOOD 20 tablet 0  . promethazine (PHENERGAN) 25 MG tablet take 1 tablet by mouth every 6 hours if needed for nausea and vomiting 30 tablet 2  . SPIRIVA HANDIHALER 18 MCG inhalation capsule Place 18 mcg into inhaler and inhale daily.  1  . VENTOLIN HFA 108 (90 Base) MCG/ACT inhaler INHALE 2 PUFFS BY MOUTH EVERY 4 HOURS IF NEEDED FOR WHEEZING OR SHORTNESS OF BREATH 18 g 0  . traMADol (ULTRAM) 50 MG tablet Take 50 mg by mouth every 6 (six) hours as needed for severe pain.     No current facility-administered medications for this visit.     Review of Systems  HENT: HENT negative.  Eyes: Eyes negative.  Respiratory: Positive for shortness of breath.  Cardiovascular: Positive for claudication.  GI: Gastrointestinal negative.  Musculoskeletal: Positive for leg pain.  Skin: Skin negative.  Neurological: Neurological negative.       Objective:  Objective   Vitals:   03/26/18 0832  BP: 137/88  Pulse: 82  Resp: 14  Temp: (!) 97.4 F (36.3 C)  TempSrc: Oral  SpO2: 100%  Weight: 154 lb (69.9 kg)  Height: 5' 6.5" (1.689 m)   Body mass index is 24.48 kg/m.  Physical Exam  Constitutional: She is oriented to person, place, and time. She appears well-developed.  HENT:  Head: Normocephalic.  Eyes: Pupils are equal, round, and reactive to light.  Neck: Normal range of motion.  Cardiovascular: Normal rate.  Pulses:      Radial pulses are 2+ on the right side, and 2+ on the left side.       Femoral pulses are 2+ on the right side, and 2+ on the left side. She has monophasic DP and PTs bilaterally  Pulmonary/Chest: Effort normal.  Abdominal: Soft. She exhibits no mass.  Musculoskeletal: Normal range of motion.  Her feet are cool to touch  Neurological: She is alert  and oriented to person, place, and time.  Skin: Skin is warm and dry.  Psychiatric: She has a normal mood and affect. Her behavior is normal. Judgment and thought content normal.    Data: I have independently interpreted her left lower extremity duplex which demonstrates occluded SFA stents proximally  I also independently interpreted her ABIs which are 0.59 right with a toe pressure of 13 and 0.64 left with a toe pressure of 40 all with monophasic signals     Assessment/Plan:     67 year old female has undergone bilateral SFA stenting.  Unfortunately all of her stents are occluded on the left side she has what appears to be rest pain although some of her pain is likely multifactorial.  I discussed  this with her.  This time her only option for revascularization will be left femoral to likely below-knee popliteal bypass.  I have discussed with her proceeding with this and we will need to do diagnostic angiogram first and likely admit her overnight plan for bypass surgery the next day.  She will also need vein mapping after her angiogram.  After her last angiogram she had severe nausea and so we will try to limit her moderate sedation on that day.  She will also need cardiac clearance although she has seen Dr. Tamala Julian in the very recent past.  We will hopefully get this procedure scheduled in the next 2 to 3 weeks.     Waynetta Sandy MD [facility]

## 2018-03-26 NOTE — Telephone Encounter (Signed)
   Primary Cardiologist: Dr Tamala Julian  Chart reviewed and patient interviewed over the phone today as part of pre-operative protocol coverage. Given past medical history and time since last visit, based on ACC/AHA guidelines, [name] would be at acceptable risk for the planned procedure without further cardiovascular testing.   Plavix was prescribed by her vascular surgeon, will defer holding it to them.  I will route this recommendation to the requesting party via Epic fax function and remove from pre-op pool.  Please call with questions.  Kerin Ransom, PA-C 03/26/2018, 2:49 PM

## 2018-03-26 NOTE — Telephone Encounter (Signed)
-----   Message from Willy Eddy, RN sent at 03/26/2018 10:49 AM EDT ----- Regarding: CARDIAC CLEARANCE REQUESTING CARDIAC CLEARANCE PATIENT IS SCHEDULE FOR LEFT FEMORAL-POPLITEAL BYPASS GRAFT  SURGERY 04/08/18 WITH DR. BRANDON CAIN/ GENERAL ANESTHESIA  NO MEDICATION CLEARANCE REQUESTED.   Gomez Cleverly RN [facility] [place] [phone] FAX [phone]

## 2018-03-26 NOTE — Telephone Encounter (Signed)
-----   Message from Willy Eddy, RN sent at 03/26/2018  9:32 AM EDT ----- Regarding: Cardiac Clearance Requesting Cardiac Clearance Dr. Donzetta Matters has scheduled patient for Left Femoral Bypass Graft for 04/08/18. General anesthesia.  Thank you for your help with this patient.   Gomez Cleverly RN [facility] Fax [phone]

## 2018-03-26 NOTE — Telephone Encounter (Signed)
   [facility] Pre-operative Risk Assessment    Request for surgical clearance:  1. What type of surgery is being performed? Left Femoral-Popliteal Bypass    2. When is this surgery scheduled? 04/08/18   3. What type of clearance is required (medical clearance vs. Pharmacy clearance to hold med vs. Both)? Medical  4. Are there any medications that need to be held prior to surgery and how long? No   5. Practice name and name of physician performing surgery? [facility], Dr. Servando Snare   6. What is your office phone number [phone]    7.   What is your office fax number [phone]  8.   Anesthesia type (None, local, MAC, general) ? General   [name] 03/26/2018, 11:03 AM  _________________________________________________________________   (provider comments below)

## 2018-03-26 NOTE — Telephone Encounter (Signed)
-----   Message from Belva Crome, MD sent at 03/26/2018  3:35 PM EDT ----- Regarding: RE: Cardiac Clearance The patient will need a Lexiscan myocardial perfusion study before clearance can be granted.  If high risk features identified, may need to have cardiac status further evaluated before vascular surgery.  Anderson Malta please schedule this patient for [facility] to clear her for lower extremity vascular surgery by Dr. Donzetta Matters ----- Message ----- From: Willy Eddy, RN Sent: 03/26/2018   9:32 AM To: Belva Crome, MD, Willy Eddy, RN Subject: Cardiac Clearance                              Requesting Cardiac Clearance Dr. Donzetta Matters has scheduled patient for Left Femoral Bypass Graft for 04/08/18. General anesthesia.  Thank you for your help with this patient.   Gomez Cleverly RN [facility] [place] Fax [phone]

## 2018-03-28 IMAGING — CR DG FOOT COMPLETE 3+V*L*
3 series · 3 of 3 positions shown · non-contrast
Comparison: None.

CLINICAL DATA: Chronic dorsal foot pain for 2 months.

EXAM:
LEFT FOOT - COMPLETE 3+ VIEW

[foot ap]
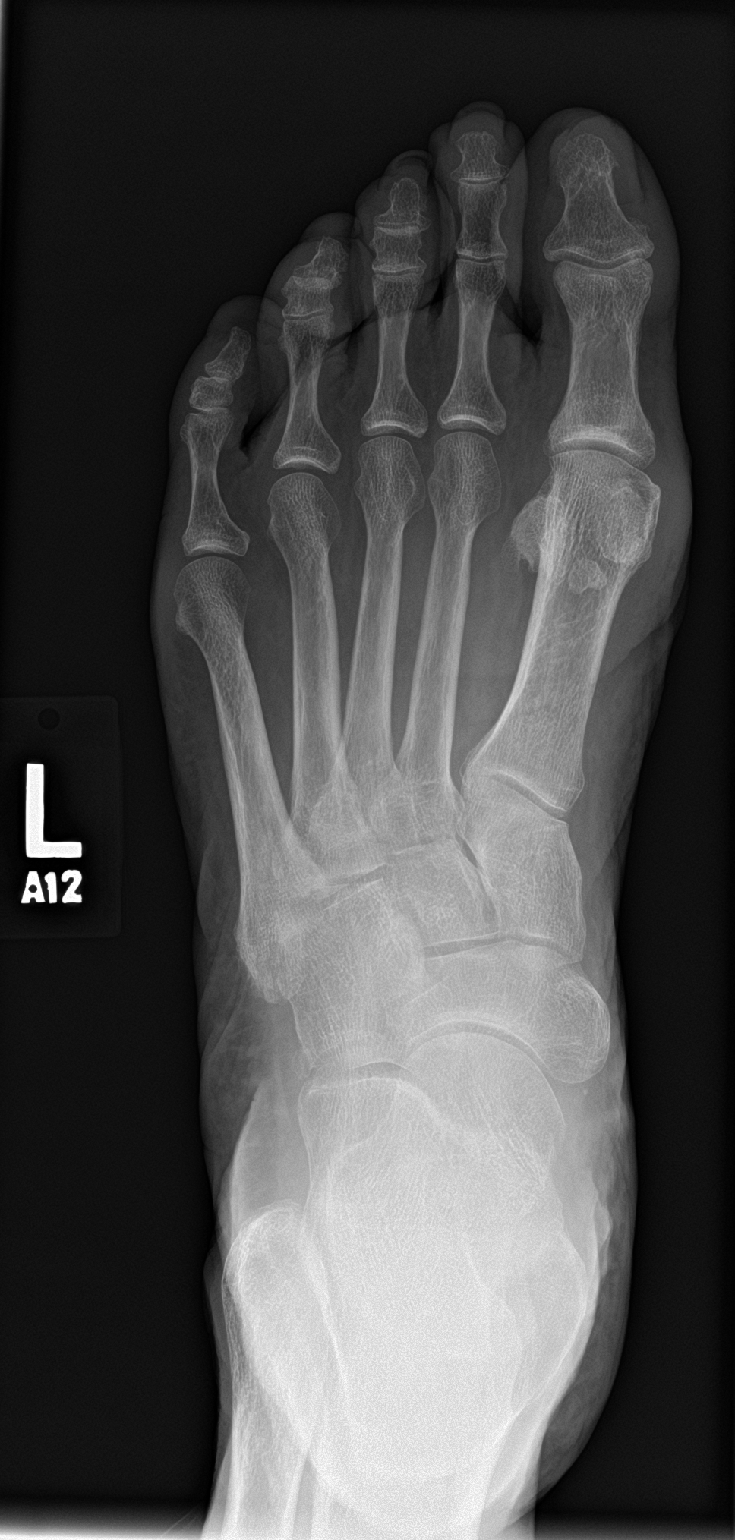

[foot obl]
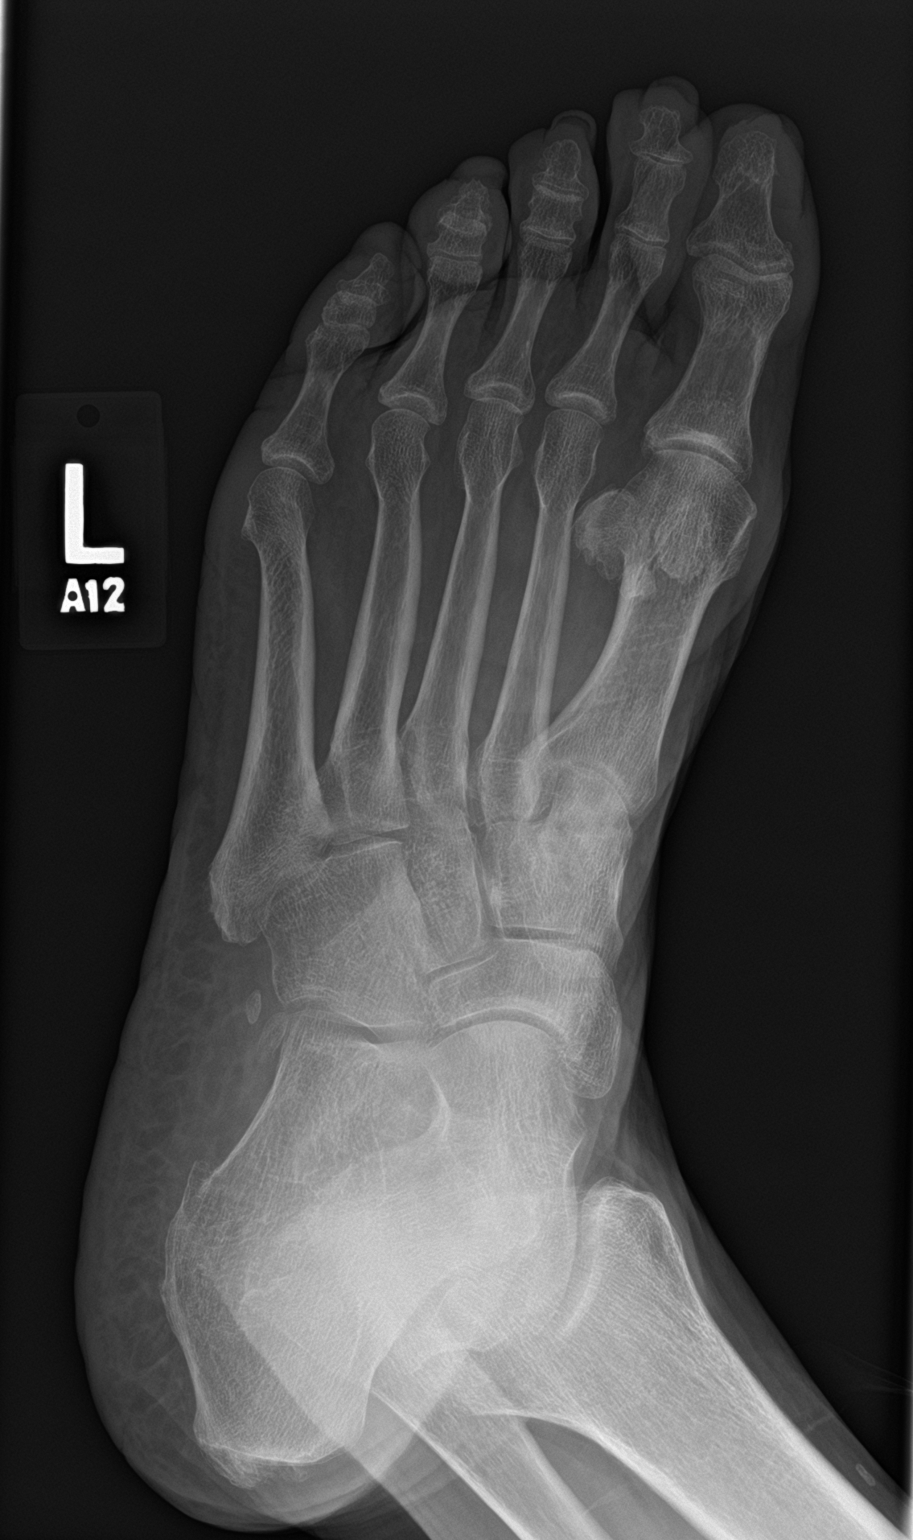

[foot lat]
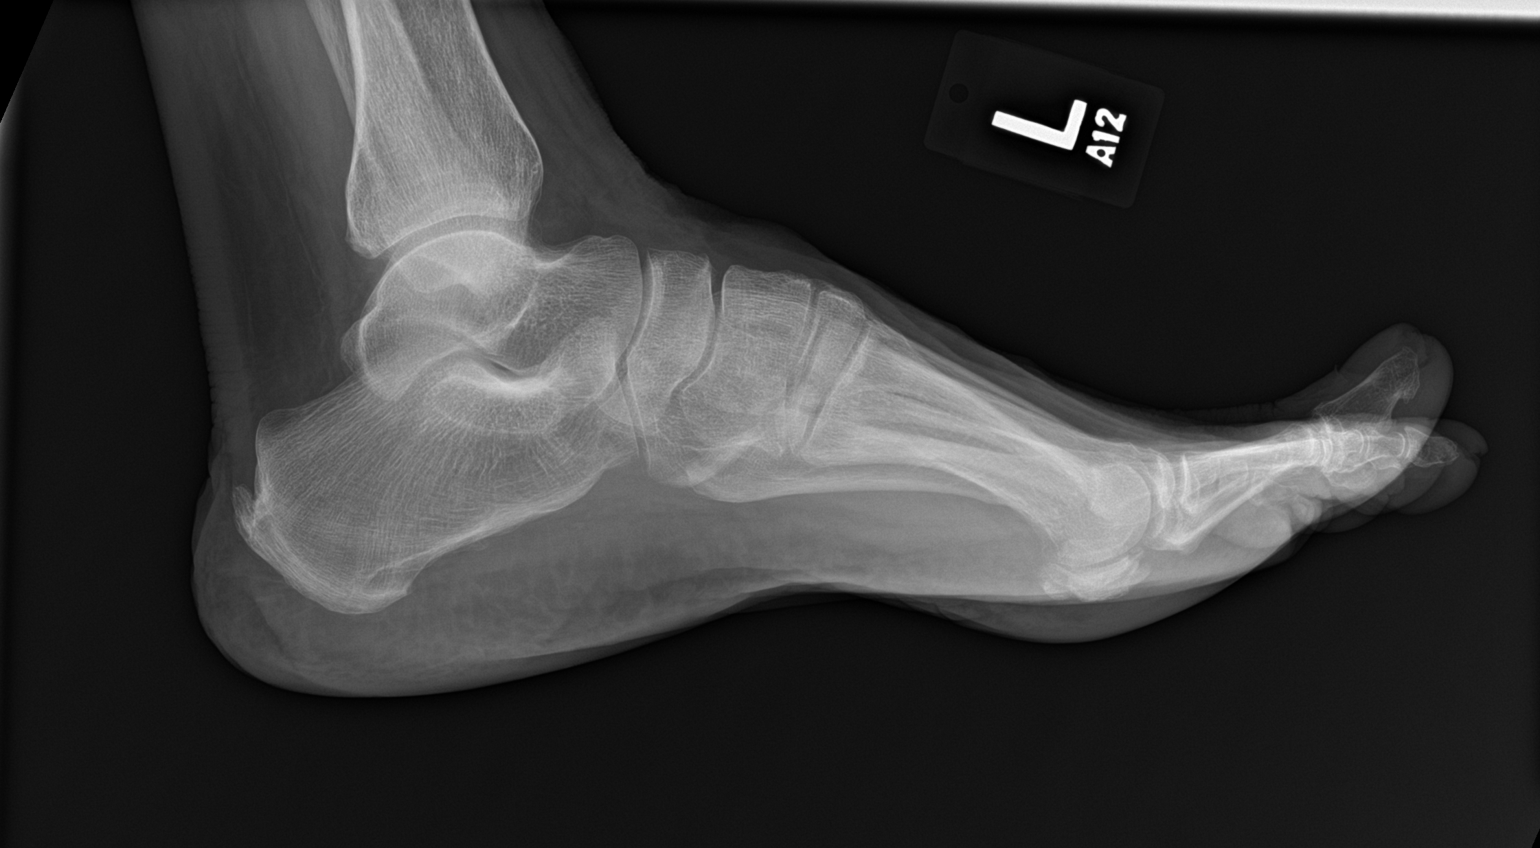

[3 of 3 positions shown; findings below may reference images not displayed]

FINDINGS: No acute fracture or dislocation is identified. There is a small to
moderate-sized posterior calcaneal enthesophyte. The joint space
widths are preserved. No osseous erosion is seen. There is mild
focal fullness of the soft tissues in the dorsal midfoot,
nonspecific.
IMPRESSION: No acute osseous abnormality.  Posterior calcaneal spur.

## 2018-03-29 ENCOUNTER — Telehealth (HOSPITAL_COMMUNITY): Payer: Self-pay | Admitting: Interventional Cardiology

## 2018-03-31 ENCOUNTER — Telehealth (HOSPITAL_COMMUNITY): Payer: Self-pay | Admitting: *Deleted

## 2018-03-31 NOTE — Telephone Encounter (Signed)
Left message on voicemail per DPR in reference to upcoming appointment scheduled on 04/02/18 with detailed instructions given per Myocardial Perfusion Study Information Sheet for the test. LM to arrive 15 minutes early, and that it is imperative to arrive on time for appointment to keep from having the test rescheduled. If you need to cancel or reschedule your appointment, please call the office within 24 hours of your appointment. Failure to do so may result in a cancellation of your appointment, and a $50 no show fee. Phone number given for call back for any questions. Madie Cahn Jacqueline    

## 2018-04-01 NOTE — Telephone Encounter (Signed)
User: [name] Date/time: 03/31/18 8:55 AM  Comment: contacted pt to sch myoview but was unable to lmsg .  Context:  Outcome: No Answer/Busy  Phone number: [phone] Phone Type: Home Phone  Comm. type: Telephone Call type: Outgoing  Contact: [name], [name] Relation to patient: Self    User: [name], [name] Fuller Date/time: 03/29/18 1:02 PM  Comment: Called pt and was unable to lmsg due to no VM being set up.  Context:  Outcome: No Answer/Busy  Phone number: [phone] Phone Type: Home Phone  Comm. type: Telephone Call type: Outgoing  Contact: [name], [name] Relation to patient: Self

## 2018-04-02 ENCOUNTER — Encounter: Payer: Self-pay | Admitting: Interventional Cardiology

## 2018-04-02 ENCOUNTER — Ambulatory Visit (HOSPITAL_COMMUNITY): Payer: Medicare HMO | Attending: Internal Medicine

## 2018-04-02 DIAGNOSIS — Z01810 Encounter for preprocedural cardiovascular examination: Secondary | ICD-10-CM | POA: Insufficient documentation

## 2018-04-02 DIAGNOSIS — Z01818 Encounter for other preprocedural examination: Secondary | ICD-10-CM | POA: Diagnosis not present

## 2018-04-02 DIAGNOSIS — R110 Nausea: Secondary | ICD-10-CM

## 2018-04-02 DIAGNOSIS — I700 Atherosclerosis of aorta: Secondary | ICD-10-CM | POA: Insufficient documentation

## 2018-04-02 LAB — MYOCARDIAL PERFUSION IMAGING
CHL CUP NUCLEAR SSS: 19
LV dias vol: 50 mL (ref 46–106)
LV sys vol: 17 mL
Peak HR: 125 {beats}/min
RATE: 0.37
Rest HR: 70 {beats}/min
SDS: 4
SRS: 15
TID: 1.24

## 2018-04-02 MED ORDER — AMINOPHYLLINE 25 MG/ML IV SOLN
150.0000 mg | Freq: Once | INTRAVENOUS | Status: AC
  Administered 2018-04-02: 150 mg via INTRAVENOUS

## 2018-04-02 MED ORDER — TECHNETIUM TC 99M TETROFOSMIN IV KIT
10.7000 | PACK | Freq: Once | INTRAVENOUS | Status: AC | PRN
  Administered 2018-04-02: 10.7 via INTRAVENOUS
  Filled 2018-04-02: qty 11

## 2018-04-02 MED ORDER — TECHNETIUM TC 99M TETROFOSMIN IV KIT
31.6000 | PACK | Freq: Once | INTRAVENOUS | Status: AC | PRN
  Administered 2018-04-02: 31.6 via INTRAVENOUS
  Filled 2018-04-02: qty 32

## 2018-04-02 MED ORDER — REGADENOSON 0.4 MG/5ML IV SOLN
0.4000 mg | Freq: Once | INTRAVENOUS | Status: AC
  Administered 2018-04-02: 0.4 mg via INTRAVENOUS

## 2018-04-05 ENCOUNTER — Encounter: Payer: Self-pay | Admitting: *Deleted

## 2018-04-05 NOTE — Progress Notes (Signed)
Patient request to change date for Angiogram and surgery due to death in family. Rescheduled for August 19/20.

## 2018-04-15 ENCOUNTER — Other Ambulatory Visit: Payer: Self-pay | Admitting: Internal Medicine

## 2018-04-19 ENCOUNTER — Inpatient Hospital Stay (HOSPITAL_COMMUNITY)
Admission: RE | Admit: 2018-04-19 | Discharge: 2018-04-24 | DRG: 254 | Disposition: A | Discharge status: Home Health | Payer: Medicare HMO | Source: Ambulatory Visit | Attending: Vascular Surgery | Admitting: Vascular Surgery

## 2018-04-19 ENCOUNTER — Encounter (HOSPITAL_COMMUNITY): Payer: Self-pay | Admitting: Vascular Surgery

## 2018-04-19 ENCOUNTER — Inpatient Hospital Stay (HOSPITAL_COMMUNITY): Payer: Medicare HMO

## 2018-04-19 ENCOUNTER — Other Ambulatory Visit: Payer: Self-pay

## 2018-04-19 ENCOUNTER — Encounter (HOSPITAL_COMMUNITY)
Admission: RE | Disposition: A | Discharge status: Home Health | Payer: Self-pay | Source: Ambulatory Visit | Attending: Vascular Surgery

## 2018-04-19 DIAGNOSIS — Z01810 Encounter for preprocedural cardiovascular examination: Secondary | ICD-10-CM

## 2018-04-19 DIAGNOSIS — J449 Chronic obstructive pulmonary disease, unspecified: Secondary | ICD-10-CM | POA: Diagnosis not present

## 2018-04-19 DIAGNOSIS — Z9889 Other specified postprocedural states: Secondary | ICD-10-CM | POA: Diagnosis not present

## 2018-04-19 DIAGNOSIS — F17200 Nicotine dependence, unspecified, uncomplicated: Secondary | ICD-10-CM | POA: Diagnosis present

## 2018-04-19 DIAGNOSIS — I739 Peripheral vascular disease, unspecified: Secondary | ICD-10-CM | POA: Diagnosis not present

## 2018-04-19 DIAGNOSIS — I70223 Atherosclerosis of native arteries of extremities with rest pain, bilateral legs: Secondary | ICD-10-CM | POA: Diagnosis not present

## 2018-04-19 DIAGNOSIS — I700 Atherosclerosis of aorta: Secondary | ICD-10-CM | POA: Diagnosis not present

## 2018-04-19 DIAGNOSIS — J441 Chronic obstructive pulmonary disease with (acute) exacerbation: Secondary | ICD-10-CM | POA: Diagnosis not present

## 2018-04-19 DIAGNOSIS — K449 Diaphragmatic hernia without obstruction or gangrene: Secondary | ICD-10-CM | POA: Diagnosis not present

## 2018-04-19 HISTORY — PX: LOWER EXTREMITY ANGIOGRAPHY: CATH118251

## 2018-04-19 LAB — CBC
HCT: 36.7 % (ref 36.0–46.0)
Hemoglobin: 11.4 g/dL — ABNORMAL LOW (ref 12.0–15.0)
MCH: 29.6 pg (ref 26.0–34.0)
MCHC: 31.1 g/dL (ref 30.0–36.0)
MCV: 95.3 fL (ref 78.0–100.0)
PLATELETS: 222 10*3/uL (ref 150–400)
RBC: 3.85 MIL/uL — ABNORMAL LOW (ref 3.87–5.11)
RDW: 14 % (ref 11.5–15.5)
WBC: 6.1 10*3/uL (ref 4.0–10.5)

## 2018-04-19 LAB — SURGICAL PCR SCREEN
MRSA, PCR: NEGATIVE
Staphylococcus aureus: NEGATIVE

## 2018-04-19 LAB — POCT I-STAT, CHEM 8
BUN: 19 mg/dL (ref 8–23)
CHLORIDE: 102 mmol/L (ref 98–111)
CREATININE: 1 mg/dL (ref 0.44–1.00)
Calcium, Ion: 1.1 mmol/L — ABNORMAL LOW (ref 1.15–1.40)
GLUCOSE: 104 mg/dL — AB (ref 70–99)
HCT: 38 % (ref 36.0–46.0)
HEMOGLOBIN: 12.9 g/dL (ref 12.0–15.0)
Potassium: 4.8 mmol/L (ref 3.5–5.1)
Sodium: 136 mmol/L (ref 135–145)
TCO2: 26 mmol/L (ref 22–32)

## 2018-04-19 LAB — CREATININE, SERUM
Creatinine, Ser: 0.88 mg/dL (ref 0.44–1.00)
GFR calc Af Amer: >60 mL/min (ref >60)
GFR calc non Af Amer: >60 mL/min (ref >60)

## 2018-04-19 SURGERY — LOWER EXTREMITY ANGIOGRAPHY
Anesthesia: LOCAL | Laterality: Bilateral

## 2018-04-19 MED ORDER — CEFAZOLIN SODIUM-DEXTROSE 2-4 GM/100ML-% IV SOLN
2.0000 g | INTRAVENOUS | Status: AC
Start: 2018-04-20 — End: 2018-04-20
  Administered 2018-04-20: 2 g via INTRAVENOUS

## 2018-04-19 MED ORDER — IODIXANOL 320 MG/ML IV SOLN
INTRAVENOUS | Status: DC | PRN
  Administered 2018-04-19: 97 mL via INTRA_ARTERIAL

## 2018-04-19 MED ORDER — HEPARIN (PORCINE) IN NACL 1000-0.9 UT/500ML-% IV SOLN
INTRAVENOUS | Status: AC
  Filled 2018-04-19: qty 1000

## 2018-04-19 MED ORDER — ASPIRIN EC 81 MG PO TBEC
81.0000 mg | DELAYED_RELEASE_TABLET | Freq: Every day | ORAL | Status: DC
  Administered 2018-04-21 – 2018-04-24 (×4): 81 mg via ORAL
  Filled 2018-04-19 (×4): qty 1

## 2018-04-19 MED ORDER — SODIUM CHLORIDE 0.9% FLUSH: 3.0000 mL | INTRAVENOUS | Status: DC | PRN

## 2018-04-19 MED ORDER — SODIUM CHLORIDE 0.9% FLUSH
3.0000 mL | Freq: Two times a day (BID) | INTRAVENOUS | Status: DC
  Administered 2018-04-20 – 2018-04-24 (×8): 3 mL via INTRAVENOUS

## 2018-04-19 MED ORDER — ATORVASTATIN CALCIUM 40 MG PO TABS
40.0000 mg | ORAL_TABLET | Freq: Every day | ORAL | Status: DC
  Administered 2018-04-19 – 2018-04-23 (×5): 40 mg via ORAL
  Filled 2018-04-19 (×5): qty 1

## 2018-04-19 MED ORDER — HEPARIN SODIUM (PORCINE) 5000 UNIT/ML IJ SOLN
5000.0000 [IU] | Freq: Three times a day (TID) | INTRAMUSCULAR | Status: DC
  Administered 2018-04-19: 5000 [IU] via SUBCUTANEOUS
  Filled 2018-04-19: qty 1

## 2018-04-19 MED ORDER — HYDRALAZINE HCL 20 MG/ML IJ SOLN: 5.0000 mg | INTRAMUSCULAR | Status: DC | PRN

## 2018-04-19 MED ORDER — SODIUM CHLORIDE 0.9 % IV SOLN
INTRAVENOUS | Status: DC
  Administered 2018-04-19: 06:00:00 via INTRAVENOUS

## 2018-04-19 MED ORDER — ACETAMINOPHEN 325 MG PO TABS
650.0000 mg | ORAL_TABLET | ORAL | Status: DC | PRN
  Administered 2018-04-21 – 2018-04-24 (×2): 650 mg via ORAL
  Filled 2018-04-19 (×2): qty 2

## 2018-04-19 MED ORDER — LIDOCAINE HCL (PF) 1 % IJ SOLN
INTRAMUSCULAR | Status: AC
  Filled 2018-04-19: qty 30

## 2018-04-19 MED ORDER — ALBUTEROL SULFATE (2.5 MG/3ML) 0.083% IN NEBU
3.0000 mL | INHALATION_SOLUTION | RESPIRATORY_TRACT | Status: DC | PRN
  Administered 2018-04-20 – 2018-04-21 (×2): 3 mL via RESPIRATORY_TRACT
  Filled 2018-04-19 (×2): qty 3

## 2018-04-19 MED ORDER — HYDROMORPHONE HCL 1 MG/ML IJ SOLN
0.5000 mg | INTRAMUSCULAR | Status: DC | PRN
  Administered 2018-04-22: 0.5 mg via INTRAVENOUS
  Filled 2018-04-19: qty 1

## 2018-04-19 MED ORDER — SODIUM CHLORIDE 0.9 % IV SOLN: 250.0000 mL | INTRAVENOUS | Status: DC | PRN

## 2018-04-19 MED ORDER — LIDOCAINE HCL (PF) 1 % IJ SOLN
INTRAMUSCULAR | Status: DC | PRN
  Administered 2018-04-19: 10 mL

## 2018-04-19 MED ORDER — OXYCODONE HCL 5 MG PO TABS
5.0000 mg | ORAL_TABLET | ORAL | Status: DC | PRN
  Administered 2018-04-19 – 2018-04-22 (×11): 10 mg via ORAL
  Administered 2018-04-23 (×2): 5 mg via ORAL
  Administered 2018-04-23: 10 mg via ORAL
  Administered 2018-04-24: 5 mg via ORAL
  Filled 2018-04-19: qty 2
  Filled 2018-04-19: qty 1
  Filled 2018-04-19 (×4): qty 2
  Filled 2018-04-19: qty 1
  Filled 2018-04-19 (×5): qty 2
  Filled 2018-04-19: qty 1
  Filled 2018-04-19 (×2): qty 2

## 2018-04-19 MED ORDER — LABETALOL HCL 5 MG/ML IV SOLN: 10.0000 mg | INTRAVENOUS | Status: DC | PRN

## 2018-04-19 MED ORDER — SODIUM CHLORIDE 0.9 % WEIGHT BASED INFUSION: 1.0000 mL/kg/h | INTRAVENOUS | Status: AC

## 2018-04-19 MED ORDER — GUAIFENESIN-DM 100-10 MG/5ML PO SYRP
5.0000 mL | ORAL_SOLUTION | ORAL | Status: DC | PRN
  Administered 2018-04-19 – 2018-04-23 (×5): 5 mL via ORAL
  Filled 2018-04-19 (×5): qty 5

## 2018-04-19 MED ORDER — HEPARIN (PORCINE) IN NACL 1000-0.9 UT/500ML-% IV SOLN
INTRAVENOUS | Status: DC | PRN
  Administered 2018-04-19 (×2): 500 mL

## 2018-04-19 MED ORDER — ONDANSETRON HCL 4 MG/2ML IJ SOLN
4.0000 mg | Freq: Four times a day (QID) | INTRAMUSCULAR | Status: DC | PRN
  Administered 2018-04-20 – 2018-04-23 (×3): 4 mg via INTRAVENOUS
  Filled 2018-04-19 (×2): qty 2

## 2018-04-19 MED ORDER — CLOPIDOGREL BISULFATE 75 MG PO TABS
75.0000 mg | ORAL_TABLET | Freq: Every day | ORAL | Status: DC
  Administered 2018-04-21 – 2018-04-24 (×4): 75 mg via ORAL
  Filled 2018-04-19 (×4): qty 1

## 2018-04-19 SURGICAL SUPPLY — 9 items
CATH OMNI FLUSH 5F 65CM (CATHETERS) ×1 IMPLANT
KIT MICROPUNCTURE NIT STIFF (SHEATH) ×1 IMPLANT
KIT PV (KITS) ×2 IMPLANT
SHEATH PINNACLE 5F 10CM (SHEATH) ×1 IMPLANT
SHEATH PROBE COVER 6X72 (BAG) ×1 IMPLANT
SYR MEDRAD MARK V 150ML (SYRINGE) ×2 IMPLANT
TRANSDUCER W/STOPCOCK (MISCELLANEOUS) ×2 IMPLANT
TRAY PV CATH (CUSTOM PROCEDURE TRAY) ×2 IMPLANT
WIRE BENTSON .035X145CM (WIRE) ×1 IMPLANT

## 2018-04-19 NOTE — Progress Notes (Signed)
Bilateral lower extremity vein mapping has been completed.    04/19/18 1:40 PM Carlos Levering RVT

## 2018-04-19 NOTE — Progress Notes (Signed)
Site area:  Right groin fa sheath Site Prior to Removal:  Level 0 Pressure Applied For: 20 minutes Manual:   yes Patient Status During Pull:  stable Post Pull Site:  Level 0 Post Pull Instructions Given:  yes Post Pull Pulses Present: rt pt dopplered Dressing Applied:  Gauze and tegaderm Bedrest begins @ 1518 Comments:

## 2018-04-19 NOTE — Progress Notes (Signed)
Pt received from cath lab. R groin site level 0. Telemetry applied, CCMD notified x2. CHG bath complete. VSS. Pt and daughter oriented to room, equipment, discussed plan of care. All questions answered. Lunch tray ordered per pt request.   Fritz Pickerel, RN

## 2018-04-19 NOTE — H&P (Signed)
   History and Physical Update  The patient was interviewed and re-examined.  The patient's previous History and Physical has been reviewed and is unchanged from recent office visit. Plan for aortogram with bilateral runoff possible intervention on the left and possible admission for bypass tomorrow.   Lamarius Dirr C. Donzetta Matters, MD [facility] Office: [phone] Pager: [phone]   04/19/2018, 7:19 AM

## 2018-04-19 NOTE — Op Note (Signed)
    Patient name: [name] MRN: [id number] DOB: June 16, 1951 Sex: female  04/19/2018 Pre-operative Diagnosis: Peripheral arterial disease with bilateral lower extremity pain Post-operative diagnosis:  Same Surgeon:  Eda Paschal. Donzetta Matters, MD Procedure Performed: 1.  Ultrasound-guided cannulation right common femoral artery 2.  Aortogram with bilateral lower extremity runoff  Indications: 67 year old female has undergone bilateral lower extremity stenting.  She continues to smoke daily.  By ultrasound her stents are occluded and she has bilateral lower extremity pain.  Pain is worse on the left.  We are evaluating her bilateral lower extremities for possible bypass grafting in the near future.  Findings: Aorta and iliac segments are diminutive although free of flow-limiting stenosis.  On the left lower extremity there occluded stents throughout the SFA.  There is a 50% stenosis at the level of the popliteal artery behind the knee.  On the right side there is a stent at the takeoff of the SFA that is occluded reconstitutes and above-knee popliteal.  There appears to be three-vessel runoff bilaterally per   Procedure:  The patient was identified in the holding area and taken to room 8.  The patient was then placed supine on the table and prepped and draped in the usual sterile fashion.  A time out was called.  Ultrasound was used to evaluate the right common femoral artery I could evaluate the stent in the SFA that was occluded.  The femoral artery itself was noted to be patent.  This was cannulated directly with micropuncture needle and an image was saved to the permanent record.  A micropuncture sheath was placed followed by Bentson wire and a 5 French sheath was placed.  Omni Flush catheter was placed to the aorta and to the level of L1.  Aortogram was performed followed by bilateral lower extremity runoff with the above findings.  Patient is to be considered for left lower extremity bypass from her common  femoral artery to the below-knee popliteal artery given the stenosis at the level of the knee.  She tolerated this procedure well without immediate complication.  Contrast 97 cc.   Johnell Bas C. Donzetta Matters, MD [facility] [place] Office: [phone] Pager: [phone]

## 2018-04-20 ENCOUNTER — Encounter (HOSPITAL_COMMUNITY): Payer: Medicare HMO

## 2018-04-20 ENCOUNTER — Inpatient Hospital Stay (HOSPITAL_COMMUNITY): Payer: Medicare HMO | Admitting: Anesthesiology

## 2018-04-20 ENCOUNTER — Encounter (HOSPITAL_COMMUNITY): Payer: Self-pay | Admitting: Anesthesiology

## 2018-04-20 ENCOUNTER — Encounter (HOSPITAL_COMMUNITY)
Admission: RE | Disposition: A | Discharge status: Home Health | Payer: Self-pay | Source: Ambulatory Visit | Attending: Vascular Surgery

## 2018-04-20 ENCOUNTER — Inpatient Hospital Stay (HOSPITAL_COMMUNITY): Admission: RE | Admit: 2018-04-20 | Payer: Medicare HMO | Source: Ambulatory Visit | Admitting: Vascular Surgery

## 2018-04-20 DIAGNOSIS — I70223 Atherosclerosis of native arteries of extremities with rest pain, bilateral legs: Secondary | ICD-10-CM

## 2018-04-20 HISTORY — PX: FEMORAL-POPLITEAL BYPASS GRAFT: SHX937

## 2018-04-20 LAB — BASIC METABOLIC PANEL
Anion gap: 8 (ref 5–15)
BUN: 14 mg/dL (ref 8–23)
CHLORIDE: 109 mmol/L (ref 98–111)
CO2: 23 mmol/L (ref 22–32)
Calcium: 9 mg/dL (ref 8.9–10.3)
Creatinine, Ser: 1.02 mg/dL — ABNORMAL HIGH (ref 0.44–1.00)
GFR calc Af Amer: >60 mL/min (ref >60)
GFR calc non Af Amer: 56 mL/min — ABNORMAL LOW (ref >60)
Glucose, Bld: 102 mg/dL — ABNORMAL HIGH (ref 70–99)
POTASSIUM: 4.6 mmol/L (ref 3.5–5.1)
SODIUM: 140 mmol/L (ref 135–145)

## 2018-04-20 LAB — CBC
HEMATOCRIT: 35.6 % — AB (ref 36.0–46.0)
Hemoglobin: 11.1 g/dL — ABNORMAL LOW (ref 12.0–15.0)
MCH: 29.8 pg (ref 26.0–34.0)
MCHC: 31.2 g/dL (ref 30.0–36.0)
MCV: 95.4 fL (ref 78.0–100.0)
Platelets: 237 10*3/uL (ref 150–400)
RBC: 3.73 MIL/uL — AB (ref 3.87–5.11)
RDW: 14.1 % (ref 11.5–15.5)
WBC: 6.1 10*3/uL (ref 4.0–10.5)

## 2018-04-20 SURGERY — BYPASS GRAFT FEMORAL-POPLITEAL ARTERY
Anesthesia: General | Site: Leg Upper | Laterality: Left

## 2018-04-20 MED ORDER — 0.9 % SODIUM CHLORIDE (POUR BTL) OPTIME
TOPICAL | Status: DC | PRN
  Administered 2018-04-20: 2000 mL

## 2018-04-20 MED ORDER — HYDROMORPHONE HCL 1 MG/ML IJ SOLN
INTRAMUSCULAR | Status: AC
  Filled 2018-04-20: qty 1

## 2018-04-20 MED ORDER — MORPHINE SULFATE (PF) 2 MG/ML IV SOLN: 2.0000 mg | INTRAVENOUS | Status: DC | PRN

## 2018-04-20 MED ORDER — HEPARIN SODIUM (PORCINE) 5000 UNIT/ML IJ SOLN
5000.0000 [IU] | Freq: Three times a day (TID) | INTRAMUSCULAR | Status: DC
  Administered 2018-04-20 – 2018-04-24 (×12): 5000 [IU] via SUBCUTANEOUS
  Filled 2018-04-20 (×12): qty 1

## 2018-04-20 MED ORDER — POTASSIUM CHLORIDE CRYS ER 20 MEQ PO TBCR: 20.0000 meq | EXTENDED_RELEASE_TABLET | Freq: Every day | ORAL | Status: DC | PRN

## 2018-04-20 MED ORDER — SODIUM CHLORIDE 0.9 % IV SOLN
INTRAVENOUS | Status: AC
  Filled 2018-04-20: qty 1.2

## 2018-04-20 MED ORDER — SODIUM CHLORIDE 0.9 % IV SOLN
INTRAVENOUS | Status: DC | PRN
  Administered 2018-04-20: 500 mL

## 2018-04-20 MED ORDER — PROPOFOL 10 MG/ML IV BOLUS
INTRAVENOUS | Status: DC | PRN
  Administered 2018-04-20 (×3): 20 mg via INTRAVENOUS
  Administered 2018-04-20: 150 mg via INTRAVENOUS

## 2018-04-20 MED ORDER — CEFAZOLIN SODIUM-DEXTROSE 2-4 GM/100ML-% IV SOLN
2.0000 g | Freq: Three times a day (TID) | INTRAVENOUS | Status: AC
  Administered 2018-04-20 (×2): 2 g via INTRAVENOUS
  Filled 2018-04-20 (×2): qty 100

## 2018-04-20 MED ORDER — HEPARIN SODIUM (PORCINE) 1000 UNIT/ML IJ SOLN
INTRAMUSCULAR | Status: DC | PRN
  Administered 2018-04-20: 7000 [IU] via INTRAVENOUS

## 2018-04-20 MED ORDER — DEXAMETHASONE SODIUM PHOSPHATE 10 MG/ML IJ SOLN
INTRAMUSCULAR | Status: DC | PRN
  Administered 2018-04-20: 10 mg via INTRAVENOUS

## 2018-04-20 MED ORDER — HYDROMORPHONE HCL 1 MG/ML IJ SOLN
0.2500 mg | INTRAMUSCULAR | Status: DC | PRN
  Administered 2018-04-20 (×2): 0.25 mg via INTRAVENOUS

## 2018-04-20 MED ORDER — SCOPOLAMINE 1 MG/3DAYS TD PT72
MEDICATED_PATCH | TRANSDERMAL | Status: DC | PRN
  Administered 2018-04-20: 1.5 mg via TRANSDERMAL

## 2018-04-20 MED ORDER — PHENOL 1.4 % MT LIQD: 1.0000 | OROMUCOSAL | Status: DC | PRN

## 2018-04-20 MED ORDER — ALUM & MAG HYDROXIDE-SIMETH 200-200-20 MG/5ML PO SUSP: 15.0000 mL | ORAL | Status: DC | PRN

## 2018-04-20 MED ORDER — MIDAZOLAM HCL 5 MG/5ML IJ SOLN
INTRAMUSCULAR | Status: DC | PRN
  Administered 2018-04-20: 2 mg via INTRAVENOUS

## 2018-04-20 MED ORDER — PANTOPRAZOLE SODIUM 40 MG PO TBEC
40.0000 mg | DELAYED_RELEASE_TABLET | Freq: Every day | ORAL | Status: DC
  Administered 2018-04-20 – 2018-04-24 (×5): 40 mg via ORAL
  Filled 2018-04-20 (×5): qty 1

## 2018-04-20 MED ORDER — ALBUTEROL SULFATE HFA 108 (90 BASE) MCG/ACT IN AERS
INHALATION_SPRAY | RESPIRATORY_TRACT | Status: DC | PRN
  Administered 2018-04-20: 2 via RESPIRATORY_TRACT

## 2018-04-20 MED ORDER — METOPROLOL TARTRATE 5 MG/5ML IV SOLN: 2.0000 mg | INTRAVENOUS | Status: DC | PRN

## 2018-04-20 MED ORDER — FENTANYL CITRATE (PF) 250 MCG/5ML IJ SOLN
INTRAMUSCULAR | Status: DC | PRN
  Administered 2018-04-20: 25 ug via INTRAVENOUS
  Administered 2018-04-20: 50 ug via INTRAVENOUS
  Administered 2018-04-20: 75 ug via INTRAVENOUS
  Administered 2018-04-20: 50 ug via INTRAVENOUS
  Administered 2018-04-20: 100 ug via INTRAVENOUS
  Administered 2018-04-20 (×2): 50 ug via INTRAVENOUS

## 2018-04-20 MED ORDER — PROMETHAZINE HCL 25 MG/ML IJ SOLN: 6.2500 mg | INTRAMUSCULAR | Status: DC | PRN

## 2018-04-20 MED ORDER — PROTAMINE SULFATE 10 MG/ML IV SOLN
INTRAVENOUS | Status: DC | PRN
  Administered 2018-04-20: 50 mg via INTRAVENOUS

## 2018-04-20 MED ORDER — SODIUM CHLORIDE 0.9 % IV SOLN: INTRAVENOUS | Status: DC

## 2018-04-20 MED ORDER — LACTATED RINGERS IV SOLN
INTRAVENOUS | Status: DC | PRN
  Administered 2018-04-20: 07:00:00 via INTRAVENOUS

## 2018-04-20 MED ORDER — SODIUM CHLORIDE 0.9 % IV SOLN: 500.0000 mL | Freq: Once | INTRAVENOUS | Status: DC | PRN

## 2018-04-20 MED ORDER — PHENYLEPHRINE 40 MCG/ML (10ML) SYRINGE FOR IV PUSH (FOR BLOOD PRESSURE SUPPORT)
PREFILLED_SYRINGE | INTRAVENOUS | Status: DC | PRN
  Administered 2018-04-20 (×2): 80 ug via INTRAVENOUS

## 2018-04-20 MED ORDER — ALBUMIN HUMAN 5 % IV SOLN
INTRAVENOUS | Status: DC | PRN
  Administered 2018-04-20: 09:00:00 via INTRAVENOUS

## 2018-04-20 MED ORDER — SUGAMMADEX SODIUM 200 MG/2ML IV SOLN
INTRAVENOUS | Status: DC | PRN
  Administered 2018-04-20: 200 mg via INTRAVENOUS

## 2018-04-20 MED ORDER — MAGNESIUM SULFATE 2 GM/50ML IV SOLN: 2.0000 g | Freq: Every day | INTRAVENOUS | Status: DC | PRN

## 2018-04-20 MED ORDER — SODIUM CHLORIDE 0.9 % IV SOLN
INTRAVENOUS | Status: DC | PRN
  Administered 2018-04-20: 20 ug/min via INTRAVENOUS

## 2018-04-20 MED ORDER — ROCURONIUM BROMIDE 10 MG/ML (PF) SYRINGE
PREFILLED_SYRINGE | INTRAVENOUS | Status: DC | PRN
  Administered 2018-04-20: 70 mg via INTRAVENOUS

## 2018-04-20 MED ORDER — LIDOCAINE 2% (20 MG/ML) 5 ML SYRINGE
INTRAMUSCULAR | Status: DC | PRN
  Administered 2018-04-20: 100 mg via INTRAVENOUS

## 2018-04-20 MED ORDER — LACTATED RINGERS IV SOLN
INTRAVENOUS | Status: DC | PRN
  Administered 2018-04-20: 08:00:00 via INTRAVENOUS

## 2018-04-20 MED ORDER — GLYCOPYRROLATE PF 0.2 MG/ML IJ SOSY
PREFILLED_SYRINGE | INTRAMUSCULAR | Status: DC | PRN
  Administered 2018-04-20: .2 mg via INTRAVENOUS

## 2018-04-20 MED ORDER — HEMOSTATIC AGENTS (NO CHARGE) OPTIME
TOPICAL | Status: DC | PRN
  Administered 2018-04-20: 1 via TOPICAL

## 2018-04-20 MED ORDER — DOCUSATE SODIUM 100 MG PO CAPS
100.0000 mg | ORAL_CAPSULE | Freq: Every day | ORAL | Status: DC
  Administered 2018-04-21 – 2018-04-24 (×4): 100 mg via ORAL
  Filled 2018-04-20 (×4): qty 1

## 2018-04-20 SURGICAL SUPPLY — 66 items
ADH SKN CLS APL DERMABOND .7 (GAUZE/BANDAGES/DRESSINGS) ×3
AGENT HMST SPONGE THK3/8 (HEMOSTASIS)
BANDAGE ESMARK 6X9 LF (GAUZE/BANDAGES/DRESSINGS) IMPLANT
BNDG CMPR 9X6 STRL LF SNTH (GAUZE/BANDAGES/DRESSINGS)
BNDG ESMARK 6X9 LF (GAUZE/BANDAGES/DRESSINGS)
CANISTER SUCT 3000ML PPV (MISCELLANEOUS) ×3 IMPLANT
CANNULA VESSEL 3MM 2 BLNT TIP (CANNULA) IMPLANT
CLIP VESOCCLUDE MED 24/CT (CLIP) ×3 IMPLANT
CLIP VESOCCLUDE SM WIDE 24/CT (CLIP) ×3 IMPLANT
CLIP VESOCCLUDE SM WIDE 6/CT (CLIP) ×4 IMPLANT
CUFF TOURNIQUET SINGLE 24IN (TOURNIQUET CUFF) ×2 IMPLANT
CUFF TOURNIQUET SINGLE 34IN LL (TOURNIQUET CUFF) IMPLANT
CUFF TOURNIQUET SINGLE 44IN (TOURNIQUET CUFF) IMPLANT
DERMABOND ADVANCED (GAUZE/BANDAGES/DRESSINGS) ×6
DERMABOND ADVANCED .7 DNX12 (GAUZE/BANDAGES/DRESSINGS) ×1 IMPLANT
DRAIN CHANNEL 15F RND FF W/TCR (WOUND CARE) IMPLANT
DRAPE C-ARM 42X72 X-RAY (DRAPES) IMPLANT
DRAPE HALF SHEET 40X57 (DRAPES) IMPLANT
DRAPE X-RAY CASS 24X20 (DRAPES) IMPLANT
ELECT CAUTERY BLADE 6.4 (BLADE) ×2 IMPLANT
ELECT REM PT RETURN 9FT ADLT (ELECTROSURGICAL) ×3
ELECTRODE REM PT RTRN 9FT ADLT (ELECTROSURGICAL) ×1 IMPLANT
EVACUATOR SILICONE 100CC (DRAIN) IMPLANT
GLOVE BIO SURGEON STRL SZ7.5 (GLOVE) ×5 IMPLANT
GLOVE BIOGEL PI IND STRL 7.0 (GLOVE) IMPLANT
GLOVE BIOGEL PI IND STRL 7.5 (GLOVE) IMPLANT
GLOVE BIOGEL PI IND STRL 8 (GLOVE) IMPLANT
GLOVE BIOGEL PI INDICATOR 7.0 (GLOVE) ×2
GLOVE BIOGEL PI INDICATOR 7.5 (GLOVE) ×2
GLOVE BIOGEL PI INDICATOR 8 (GLOVE) ×2
GLOVE ECLIPSE 7.0 STRL STRAW (GLOVE) ×2 IMPLANT
GOWN STRL REUS W/ TWL LRG LVL3 (GOWN DISPOSABLE) ×2 IMPLANT
GOWN STRL REUS W/ TWL XL LVL3 (GOWN DISPOSABLE) ×1 IMPLANT
GOWN STRL REUS W/TWL LRG LVL3 (GOWN DISPOSABLE) ×12
GOWN STRL REUS W/TWL XL LVL3 (GOWN DISPOSABLE) ×3
GRAFT PROPATEN W/RING 6X80X60 (Vascular Products) ×2 IMPLANT
HEMOSTAT SNOW SURGICEL 2X4 (HEMOSTASIS) ×2 IMPLANT
HEMOSTAT SPONGE AVITENE ULTRA (HEMOSTASIS) IMPLANT
INSERT FOGARTY SM (MISCELLANEOUS) IMPLANT
KIT BASIN OR (CUSTOM PROCEDURE TRAY) ×3 IMPLANT
KIT TURNOVER KIT B (KITS) ×3 IMPLANT
MARKER GRAFT CORONARY BYPASS (MISCELLANEOUS) IMPLANT
NS IRRIG 1000ML POUR BTL (IV SOLUTION) ×6 IMPLANT
PACK PERIPHERAL VASCULAR (CUSTOM PROCEDURE TRAY) ×3 IMPLANT
PAD ARMBOARD 7.5X6 YLW CONV (MISCELLANEOUS) ×6 IMPLANT
PENCIL BUTTON HOLSTER BLD 10FT (ELECTRODE) ×2 IMPLANT
SET COLLECT BLD 21X3/4 12 (NEEDLE) IMPLANT
STOPCOCK 4 WAY LG BORE MALE ST (IV SETS) IMPLANT
SUT ETHILON 3 0 PS 1 (SUTURE) IMPLANT
SUT GORETEX 6.0 TT13 (SUTURE) IMPLANT
SUT GORETEX 6.0 TT9 (SUTURE) IMPLANT
SUT MNCRL AB 4-0 PS2 18 (SUTURE) ×6 IMPLANT
SUT PROLENE 5 0 C 1 24 (SUTURE) ×5 IMPLANT
SUT PROLENE 6 0 BV (SUTURE) ×5 IMPLANT
SUT PROLENE 7 0 BV 1 (SUTURE) IMPLANT
SUT SILK 2 0 SH (SUTURE) ×3 IMPLANT
SUT SILK 3 0 (SUTURE) ×3
SUT SILK 3-0 18XBRD TIE 12 (SUTURE) IMPLANT
SUT VIC AB 2-0 CT1 27 (SUTURE) ×6
SUT VIC AB 2-0 CT1 TAPERPNT 27 (SUTURE) ×2 IMPLANT
SUT VIC AB 3-0 SH 27 (SUTURE) ×6
SUT VIC AB 3-0 SH 27X BRD (SUTURE) ×2 IMPLANT
TOWEL GREEN STERILE (TOWEL DISPOSABLE) ×3 IMPLANT
TRAY FOLEY MTR SLVR 16FR STAT (SET/KITS/TRAYS/PACK) ×3 IMPLANT
UNDERPAD 30X30 (UNDERPADS AND DIAPERS) ×3 IMPLANT
WATER STERILE IRR 1000ML POUR (IV SOLUTION) ×3 IMPLANT

## 2018-04-20 NOTE — Anesthesia Postprocedure Evaluation (Signed)
Anesthesia Post Note  Patient: [name]  Procedure(s) Performed: Left FEMORAL-POPLITEAL ARTERY Bypass Graft using Gore Propaten Vascular Graft (Left Leg Upper)     Patient location during evaluation: PACU Anesthesia Type: General Level of consciousness: sedated Pain management: pain level controlled Vital Signs Assessment: post-procedure vital signs reviewed and stable Respiratory status: spontaneous breathing and respiratory function stable Cardiovascular status: stable Postop Assessment: no apparent nausea or vomiting Anesthetic complications: no    Last Vitals:  Vitals:   04/20/18 1015 04/20/18 1043  BP: (!) 162/80 (!) 161/90  Pulse: 85 89  Resp: 12 12  Temp: 36.5 C (!) 36.1 C  SpO2: 95% 95%    Last Pain:  Vitals:   04/20/18 1043  TempSrc: Axillary  PainSc:                  Theona Muhs DANIEL

## 2018-04-20 NOTE — Transfer of Care (Signed)
Immediate Anesthesia Transfer of Care Note  Patient: [name]  Procedure(s) Performed: Left FEMORAL-POPLITEAL ARTERY Bypass Graft using Gore Propaten Vascular Graft (Left Leg Upper)  Patient Location: PACU  Anesthesia Type:General  Level of Consciousness: awake, alert  and oriented  Airway & Oxygen Therapy: Patient Spontanous Breathing and Patient connected to face mask oxygen  Post-op Assessment: Report given to RN and Post -op Vital signs reviewed and stable  Post vital signs: Reviewed and stable  Last Vitals:  Vitals Value Taken Time  BP 166/90 04/20/2018  9:28 AM  Temp    Pulse 97 04/20/2018  9:36 AM  Resp 13 04/20/2018  9:36 AM  SpO2 100 % 04/20/2018  9:36 AM  Vitals shown include unvalidated device data.  Last Pain:  Vitals:   04/20/18 0349  TempSrc: Oral  PainSc:       Patients Stated Pain Goal: 3 (17/35/67 0141)  Complications: No apparent anesthesia complications

## 2018-04-20 NOTE — Op Note (Signed)
Patient name: Zarra Geffert MRN: [id number] DOB: 1950/09/14 Sex: female  04/20/2018 Pre-operative Diagnosis: Peripheral arterial disease with bilateral lower extremity rest pain Post-operative diagnosis:  Same Surgeon:  Eda Paschal. Donzetta Matters, MD Co-surgeon: Gae Gallop, MD Assistant: Laurence Slate, PA Procedure Performed: 1.  Left common femoral endarterectomy 2.  Left femoral to below-knee popliteal artery bypass with 6 mm ring PTFE   Indications: 67 year old female with history of bilateral lower extremity pain.  She is undergone bilateral lower extremity stenting which were found to be occluded on duplex and confirmed with angiogram yesterday.  Her left leg gives her the most symptoms.  We are proceeding with left common femoral to below-knee artery bypass.  Preoperative vein mapping there is not suitable vein for bypass.  Findings: Left common femoral artery was heavily diseased with soft plaque and endarterectomy was performed and there was very strong inflow as well as strong backbleeding from the profunda and trickle flow from the SFA.  The below-knee popliteal artery was adequate size and soft amenable for bypass.  There is a large saphenous vein in the groin that was very diminutive in the high thigh remained that way throughout the rest of the leg.   Procedure:  The patient was identified in the holding area and taken to the operating room where she is placed supine on the operating table and general endotracheal anesthesia was induced she was given antibiotics and a timeout was called.  The co-surgeon was necessary due to patient's comorbid conditions speed of the operation.  First she is ultrasound cannot identify any suitable saphenous vein in the left thigh except in the groin.  Concomitant dissections of the common femoral artery and below-knee popliteal arteries were then undertaken.  Transverse incision was made in the groin we dissected down the common femoral placed Vesseloops around  the external iliac artery up I followed by the profunda and superficial femoral arteries.  Same time the below-knee popliteal incision was made and Dr. Scot Dock dissected out the popliteal artery and marked it.  6 mm PTFE was then tunneled tween the 2 incisions in a subfascial plane the patient was fully heparinized.  The leg was straightened and the graft was trimmed to size.  A tourniquet was placed above the knee and Esmarch was used to exsanguinate the leg and tourniquet was inflated to 250 mmHg.  We then clamped the common femoral artery up on the external iliac artery as well as the profunda.  We opened this longitudinally we encountered significant soft plaque.  Endarterectomy was performed including down into the SFA where there was some old appearing clot that was removed as well.  We did have good backbleeding from the profunda.  Below the knee the popliteal artery was opened longitudinally where it was free of disease.  The graft there was sewn end-to-side with 6-0 Prolene suture.  In the groin we then sewed the graft and the side after beveling it with 5-0 Prolene suture.  Prior to completing the groin anastomosis I allowed flushing through it we then flushed to the graft completed both anastomosis simultaneously.  There was very strong signal in the popliteal artery distal to the graft that diminished with compression of the graft.  At the ankle there is a very strong posterior tibial signal that was nearly absent with compression of the graft.  Satisfied with this 50 mg of protamine was administered which she tolerated well.  We then obtained hemostasis and the wounds irrigated and closed in layers with  Vicryl Monocryl.  She was allowed away from anesthesia having tolerated procedure without any comp occasion.  All counts were correct at completion.  Next  EBL 50 cc.    Brandon C. Donzetta Matters, MD [facility] Office: [phone] Pager: [phone]

## 2018-04-20 NOTE — Progress Notes (Signed)
   Patient evaluated back in room.  She has palpable pedal pulses on the left.  Incisions are clean dry and intact.  She is having expected nausea post procedure that she has had with all procedures.  We will continue to monitor.  Brandon C. Donzetta Matters, MD [facility] [place] Office: [phone] Pager: [phone]

## 2018-04-20 NOTE — Anesthesia Preprocedure Evaluation (Signed)
Anesthesia Evaluation  Patient identified by MRN, date of birth, ID band Patient awake    Reviewed: Allergy & Precautions, NPO status , Patient's Chart, lab work & pertinent test results  Airway Mallampati: I  TM Distance: >3 FB Neck ROM: Full    Dental  (+) Edentulous Upper, Dental Advisory Given   Pulmonary asthma , COPD,  COPD inhaler, Current Smoker,    Pulmonary exam normal        Cardiovascular + Peripheral Vascular Disease  Normal cardiovascular exam     Neuro/Psych  Headaches, negative psych ROS   GI/Hepatic Neg liver ROS, GERD  ,  Endo/Other  negative endocrine ROS  Renal/GU negative Renal ROS  negative genitourinary   Musculoskeletal negative musculoskeletal ROS (+)   Abdominal   Peds negative pediatric ROS (+)  Hematology negative hematology ROS (+)   Anesthesia Other Findings   Reproductive/Obstetrics negative OB ROS                             Anesthesia Physical Anesthesia Plan  ASA: III  Anesthesia Plan: General   Post-op Pain Management:    Induction: Intravenous  PONV Risk Score and Plan: 3 and Ondansetron, Dexamethasone and Scopolamine patch - Pre-op  Airway Management Planned: Oral ETT  Additional Equipment:   Intra-op Plan:   Post-operative Plan: Extubation in OR  Informed Consent: I have reviewed the patients History and Physical, chart, labs and discussed the procedure including the risks, benefits and alternatives for the proposed anesthesia with the patient or authorized representative who has indicated his/her understanding and acceptance.   Dental advisory given  Plan Discussed with: CRNA and Anesthesiologist  Anesthesia Plan Comments:         Anesthesia Quick Evaluation

## 2018-04-20 NOTE — Progress Notes (Signed)
  Progress Note    04/20/2018 6:59 AM Day of Surgery  Subjective: no acute issues  Vitals:   04/20/18 0039 04/20/18 0349  BP:  140/82  Pulse:  79  Resp:  19  Temp:  98.2 F (36.8 C)  SpO2: 100% 99%    Physical Exam: aaox3 Non labored respirations Right groin is soft  CBC    Component Value Date/Time   WBC 6.1 04/20/2018 0308   RBC 3.73 (L) 04/20/2018 0308   HGB 11.1 (L) 04/20/2018 0308   HCT 35.6 (L) 04/20/2018 0308   PLT 237 04/20/2018 0308   MCV 95.4 04/20/2018 0308   MCH 29.8 04/20/2018 0308   MCHC 31.2 04/20/2018 0308   RDW 14.1 04/20/2018 0308   LYMPHSABS 1.2 09/15/2017 1021   MONOABS 0.2 09/15/2017 1021   EOSABS 0.0 09/15/2017 1021   BASOSABS 0.0 09/15/2017 1021    BMET    Component Value Date/Time   NA 140 04/20/2018 0308   K 4.6 04/20/2018 0308   CL 109 04/20/2018 0308   CO2 23 04/20/2018 0308   GLUCOSE 102 (H) 04/20/2018 0308   BUN 14 04/20/2018 0308   CREATININE 1.02 (H) 04/20/2018 0308   CREATININE 0.79 08/15/2013 1446   CALCIUM 9.0 04/20/2018 0308   GFRNONAA 56 (L) 04/20/2018 0308   GFRAA >60 04/20/2018 0308    INR No results found for: INR   Intake/Output Summary (Last 24 hours) at 04/20/2018 0659 Last data filed at 04/19/2018 1500 Gross per 24 hour  Intake 485.35 ml  Output -  Net 485.35 ml     Assessment:  67 y.o. female is s/p angiogram yesterday.  Plan: OR today for left fem-pop bypass likely with graft   Brandon C. Donzetta Matters, MD [facility] Office: [phone] Pager: [phone]  04/20/2018 6:59 AM

## 2018-04-20 NOTE — Anesthesia Procedure Notes (Signed)
Procedure Name: Intubation Date/Time: 04/20/2018 7:25 AM Performed by: Marsa Aris, CRNA Pre-anesthesia Checklist: Patient identified, Emergency Drugs available, Suction available and Patient being monitored Patient Re-evaluated:Patient Re-evaluated prior to induction Oxygen Delivery Method: Circle System Utilized Preoxygenation: Pre-oxygenation with 100% oxygen Induction Type: IV induction Ventilation: Mask ventilation without difficulty Laryngoscope Size: Miller and 2 Grade View: Grade I Tube type: Oral Tube size: 7.5 mm Number of attempts: 1 Airway Equipment and Method: Stylet and Oral airway Placement Confirmation: ETT inserted through vocal cords under direct vision,  positive ETCO2 and breath sounds checked- equal and bilateral Secured at: 22 cm Tube secured with: Tape Dental Injury: Teeth and Oropharynx as per pre-operative assessment

## 2018-04-21 ENCOUNTER — Inpatient Hospital Stay (HOSPITAL_COMMUNITY): Payer: Medicare HMO

## 2018-04-21 ENCOUNTER — Encounter (HOSPITAL_COMMUNITY): Payer: Self-pay | Admitting: Vascular Surgery

## 2018-04-21 DIAGNOSIS — Z9889 Other specified postprocedural states: Secondary | ICD-10-CM

## 2018-04-21 LAB — CBC
HCT: 35.2 % — ABNORMAL LOW (ref 36.0–46.0)
Hemoglobin: 11 g/dL — ABNORMAL LOW (ref 12.0–15.0)
MCH: 29.6 pg (ref 26.0–34.0)
MCHC: 31.3 g/dL (ref 30.0–36.0)
MCV: 94.6 fL (ref 78.0–100.0)
PLATELETS: 223 10*3/uL (ref 150–400)
RBC: 3.72 MIL/uL — ABNORMAL LOW (ref 3.87–5.11)
RDW: 13.9 % (ref 11.5–15.5)
WBC: 12.3 10*3/uL — ABNORMAL HIGH (ref 4.0–10.5)

## 2018-04-21 LAB — BASIC METABOLIC PANEL
Anion gap: 8 (ref 5–15)
BUN: 14 mg/dL (ref 8–23)
CALCIUM: 9.3 mg/dL (ref 8.9–10.3)
CO2: 26 mmol/L (ref 22–32)
Chloride: 103 mmol/L (ref 98–111)
Creatinine, Ser: 1.04 mg/dL — ABNORMAL HIGH (ref 0.44–1.00)
GFR calc Af Amer: >60 mL/min (ref >60)
GFR, EST NON AFRICAN AMERICAN: 55 mL/min — AB (ref >60)
Glucose, Bld: 127 mg/dL — ABNORMAL HIGH (ref 70–99)
Potassium: 5.6 mmol/L — ABNORMAL HIGH (ref 3.5–5.1)
Sodium: 137 mmol/L (ref 135–145)

## 2018-04-21 MED ORDER — ALBUTEROL SULFATE (2.5 MG/3ML) 0.083% IN NEBU
3.0000 mL | INHALATION_SOLUTION | Freq: Four times a day (QID) | RESPIRATORY_TRACT | Status: DC
  Administered 2018-04-21 – 2018-04-22 (×4): 3 mL via RESPIRATORY_TRACT
  Filled 2018-04-21 (×4): qty 3

## 2018-04-21 NOTE — Evaluation (Signed)
Physical Therapy Evaluation Patient Details Name: [name] MRN: [id number] DOB: July 12, 1951 Today's Date: 04/21/2018   History of Present Illness  [name] is a 67yo female who comes to [facility] on 8/19 for vascular procedure at Rt common femoral artery and aortogram. Pt subseqeutnly underwent  Left femoral below knee popliteal bypass on 8/20. Pt reports baseline AMB limitations PTA d/t progressive leg pain and foot numbness while AMB.   Clinical Impression  Pt admitted with above diagnosis. Pt currently with functional limitations due to the deficits listed below (see "PT Problem List"). Upon entry, pt in bed, no family/caregiver present. The pt is awake and agreeable to participate. The pt is alert and oriented x3, pleasant, conversational, and following simple commands consistently, often pausing d/t pain or coughing. Difficulty obtaining orthostatic vitals as position changes often triggering poorly controlled coughing spells, thick congestion noted, sputum mostly clear and mildly sanguinous. AMB is limited in distance d/t coughing spells. Functional mobility assessment demonstrates increased effort/time requirements, poor tolerance, and need for physical assistance and verbal cuing, whereas the patient performed these at a higher level of independence PTA. Pt will benefit from skilled PT intervention to increase independence and safety with basic mobility in preparation for discharge to the venue listed below.       Follow Up Recommendations Home health PT    Equipment Recommendations  Rolling walker with 5" wheels    Recommendations for Other Services       Precautions / Restrictions Precautions Precautions: Fall Precaution Comments: LLE incisions, coughing spells with congestion.  Restrictions Weight Bearing Restrictions: No      Mobility  Bed Mobility Overal bed mobility: Modified Independent             General bed mobility comments: slow and antalgic, effort is low    Transfers Overall transfer level: Needs assistance Equipment used: Rolling walker (2 wheeled) Transfers: Sit to/from Stand Sit to Stand: Supervision         General transfer comment: verbal cues for safe RW technique, antalgic posturing for sitting on commode to avoid Left inguinal pain exacerbation; heavy BUE use needed to pull self to standing from commode.   Ambulation/Gait Ambulation/Gait assistance: Supervision Gait Distance (Feet): 80 Feet Assistive device: Rolling walker (2 wheeled) Gait Pattern/deviations: Antalgic(slow ) Gait velocity: 0.71m/s Gait velocity interpretation: <1.31 ft/sec, indicative of household ambulator    Financial trader Rankin (Stroke Patients Only)       Balance                                             Pertinent Vitals/Pain Pain Assessment: 0-10 Pain Score: 5  Pain Location: Left Leg    Home Living Family/patient expects to be discharged to:: Private residence Living Arrangements: Other relatives(sister)   Type of Home: House Home Access: Stairs to enter Entrance Stairs-Rails: Psychiatric nurse of Steps: 5 Home Layout: One level Home Equipment: None      Prior Function Level of Independence: Independent         Comments: limited community ambulator fully independent, driving, grocerying;  limited activity tolerance d/t vascular claudication; 1 fall in past three months.      Hand Dominance   Dominant Hand: Right    Extremity/Trunk Assessment  Cervical / Trunk Assessment Cervical / Trunk Assessment: Normal  Communication      Cognition Arousal/Alertness: Awake/alert Behavior During Therapy: WFL for tasks assessed/performed Overall Cognitive Status: Within Functional Limits for tasks assessed                                        General Comments      Exercises     Assessment/Plan    PT  Assessment Patient needs continued PT services  PT Problem List Decreased strength;Decreased range of motion;Decreased activity tolerance;Pain;Decreased safety awareness       PT Treatment Interventions DME instruction;Functional mobility training;Stair training;Gait training;Patient/family education;Therapeutic activities;Therapeutic exercise    PT Goals (Current goals can be found in the Care Plan section)  Acute Rehab PT Goals Patient Stated Goal: improve AMB tolerance with decreased leg pain PT Goal Formulation: With patient Time For Goal Achievement: 05/05/18 Potential to Achieve Goals: Good    Frequency Min 3X/week   Barriers to discharge        Co-evaluation               AM-PAC PT "6 Clicks" Daily Activity  Outcome Measure Difficulty turning over in bed (including adjusting bedclothes, sheets and blankets)?: A Little Difficulty moving from lying on back to sitting on the side of the bed? : A Little Difficulty sitting down on and standing up from a chair with arms (e.g., wheelchair, bedside commode, etc,.)?: A Lot Help needed moving to and from a bed to chair (including a wheelchair)?: A Little Help needed walking in hospital room?: A Little Help needed climbing 3-5 steps with a railing? : A Lot 6 Click Score: 16    End of Session Equipment Utilized During Treatment: Gait belt Activity Tolerance: Patient tolerated treatment well;Treatment limited secondary to medical complications (Comment)(cooughing spells, productive with clear, mildly sanguinous sputum) Patient left: in chair;with call bell/phone within reach(LUE elevated) Nurse Communication: Mobility status PT Visit Diagnosis: Unsteadiness on feet (R26.81);Difficulty in walking, not elsewhere classified (R26.2);Muscle weakness (generalized) (M62.81)    Time: 8413-2440 PT Time Calculation (min) (ACUTE ONLY): 33 min   Charges:   PT Evaluation $PT Eval Moderate Complexity: 1 Mod PT Treatments $Therapeutic  Activity: 8-22 mins        9:14 AM, 04/21/18 Etta Grandchild, PT, DPT Physical Therapist - [place] [phone] (Pager)  [phone] (Office)      [name] 04/21/2018, 9:11 AM

## 2018-04-21 NOTE — Progress Notes (Signed)
ABI's have been completed. Right 0.43 Left 0.88  04/21/18 11:47 AM [name] RVT

## 2018-04-21 NOTE — Evaluation (Signed)
Occupational Therapy Evaluation Patient Details Name: [name] MRN: [id number] DOB: [birth year] Today's Date: 04/21/2018    History of Present Illness [name] is a 67yo female who comes to [facility] on 8/19 for vascular procedure at Rt common femoral artery and aortogram. Pt subseqeutnly underwent  Left femoral below knee popliteal bypass on 8/20. Pt reports baseline AMB limitations PTA d/t progressive leg pain and foot numbness while AMB.    Clinical Impression3   PTA, pt was living alone and reports her sister will be staying at dc. Pt reporting she was independent with ADLs and IADLs. Currently, pt requiring supervision for safety during ADLs and functional mobility with RW. Pt with limited activity tolerance due to pain. Evaluation limited by arrival of transport to go to vascular lab. Pt would benefit from further acute OT to facilitate safe dc. Recommend dc to home once medically stable per physician.   Follow Up Recommendations  No OT follow up;Supervision/Assistance - 24 hour    Equipment Recommendations  None recommended by OT    Recommendations for Other Services PT consult     Precautions / Restrictions Precautions Precautions: Fall Precaution Comments: LLE incisions, coughing spells with congestion.  Restrictions Weight Bearing Restrictions: No      Mobility Bed Mobility Overal bed mobility: Modified Independent             General bed mobility comments: slow and antalgic, effort is low   Transfers Overall transfer level: Needs assistance Equipment used: Rolling walker (2 wheeled) Transfers: Sit to/from Stand Sit to Stand: Supervision         General transfer comment: supervision for safety    Balance Overall balance assessment: Mild deficits observed, not formally tested                                         ADL either performed or assessed with clinical judgement   ADL Overall ADL's : Needs  assistance/impaired Eating/Feeding: Independent   Grooming: Set up;Supervision/safety;Sitting   Upper Body Bathing: Set up;Supervision/ safety;Sitting   Lower Body Bathing: Set up;Supervison/ safety;Sit to/from stand   Upper Body Dressing : Set up;Supervision/safety;Sitting   Lower Body Dressing: Set up;Supervision/safety;Sit to/from stand Lower Body Dressing Details (indicate cue type and reason): Pt able to adjsut socks without difficulty. Supervision for safety in standing.  Toilet Transfer: Set up;Supervision/safety;Ambulation;RW(Simulated in room) Toilet Transfer Details (indicate cue type and reason): Supervision for safety.         Functional mobility during ADLs: Supervision/safety;Rolling walker General ADL Comments: Pt presenting with decreased functional performance due to pain. Pt near baselien funcitonal and requiring supervision throughout for safety.      Vision         Perception     Praxis      Pertinent Vitals/Pain Pain Assessment: 0-10 Pain Score: 5  Pain Location: Left Leg Pain Descriptors / Indicators: Constant;Discomfort Pain Intervention(s): Monitored during session;Limited activity within patient's tolerance;Repositioned     Hand Dominance Right   Extremity/Trunk Assessment Upper Extremity Assessment Upper Extremity Assessment: Overall WFL for tasks assessed   Lower Extremity Assessment Lower Extremity Assessment: Defer to PT evaluation   Cervical / Trunk Assessment Cervical / Trunk Assessment: Normal   Communication Communication Communication: No difficulties   Cognition Arousal/Alertness: Awake/alert Behavior During Therapy: WFL for tasks assessed/performed Overall Cognitive Status: Within Functional Limits for tasks assessed  General Comments  Eval limited due to transport arriving and needing to go to Vascular Lab    Exercises     Shoulder Instructions      Home Living  Family/patient expects to be discharged to:: Private residence Living Arrangements: Other relatives(sister) Available Help at Discharge: Family;Available 24 hours/day("My sister will be around") Type of Home: House Home Access: Stairs to enter [employer] of Steps: 5 Entrance Stairs-Rails: Right;Left Home Layout: One level     Bathroom Shower/Tub: Corporate investment banker: Standard     Home Equipment: None          Prior Functioning/Environment Level of Independence: Independent        Comments: limited community ambulator fully independent, driving, grocerying;  limited activity tolerance d/t vascular claudication; 1 fall in past three months.         OT Problem List: Decreased strength;Decreased range of motion;Decreased activity tolerance;Impaired balance (sitting and/or standing);Decreased knowledge of use of DME or AE;Decreased knowledge of precautions;Pain      OT Treatment/Interventions: Self-care/ADL training;Therapeutic exercise;Energy conservation;DME and/or AE instruction;Therapeutic activities;Patient/family education    OT Goals(Current goals can be found in the care plan section) Acute Rehab OT Goals Patient Stated Goal: "Walk and go home" OT Goal Formulation: With patient Time For Goal Achievement: 05/05/18 Potential to Achieve Goals: Good ADL Goals Pt Will Perform Lower Body Dressing: with modified independence;sit to/from stand Pt Will Transfer to Toilet: with modified independence;ambulating;regular height toilet Pt Will Perform Tub/Shower Transfer: Tub transfer;rolling walker;ambulating;with modified independence  OT Frequency: Min 2X/week   Barriers to D/C:            Co-evaluation              AM-PAC PT "6 Clicks" Daily Activity     Outcome Measure Help from another person eating meals?: None Help from another person taking care of personal grooming?: A Little Help from another person toileting, which  includes using toliet, bedpan, or urinal?: A Little Help from another person bathing (including washing, rinsing, drying)?: A Little Help from another person to put on and taking off regular upper body clothing?: None Help from another person to put on and taking off regular lower body clothing?: A Little 6 Click Score: 20   End of Session Equipment Utilized During Treatment: Rolling walker Nurse Communication: Mobility status  Activity Tolerance: Patient tolerated treatment well Patient left: in bed;with call bell/phone within reach  OT Visit Diagnosis: Unsteadiness on feet (R26.81);Other abnormalities of gait and mobility (R26.89);Muscle weakness (generalized) (M62.81);Pain Pain - Right/Left: Left Pain - part of body: Leg                Time: 1058-1106 OT Time Calculation (min): 8 min Charges:  OT General Charges $OT Visit: 1 Visit OT Evaluation $OT Eval Low Complexity: [place], OTR/L Acute Rehab Pager: [phone] Office: [place] 04/21/2018, 12:04 PM

## 2018-04-21 NOTE — Progress Notes (Signed)
OT Cancellation Note  Patient Details Name: [name] MRN: [id number] DOB: February 24, 1951   Cancelled Treatment:    Reason Eval/Treat Not Completed: Patient at procedure or test/ unavailable(Working with PT. Will return as schedule allows. Thank you)  [place], OTR/L Acute Rehab Pager: [phone] Office: [phone] 04/21/2018, 8:45 AM

## 2018-04-21 NOTE — Progress Notes (Addendum)
[facility] [place]  Subjective  - Doing well over all.  Pain at incisions.   Objective (!) 144/91 87 98.9 F (37.2 C) (Oral) (!) 21 98%  Intake/Output Summary (Last 24 hours) at 04/21/2018 0721 Last data filed at 04/21/2018 0441 Gross per 24 hour  Intake 1673 ml  Output 2475 ml  Net -802 ml    Palpable DP, active range of motion of left LE intact as well as sensation. Incisions healing well without groin hematoma, minimal fullness at the popliteal incision. Lungs non labored breathing  Gen NAD Heart RRR  Assessment/Planning: POD # 1 fem-below knee pop by pass with PTFE  Plan for d/c foley this am, ambulate, elevate the left LE when at rest.  Dry guaze placed in left groin crease.   Stable disposition with patent bypass graft and palpable DP pulse.  Roxy Horseman 04/21/2018 7:21 AM --  Laboratory Lab Results: Recent Labs    04/20/18 0308 04/21/18 0317  WBC 6.1 12.3*  HGB 11.1* 11.0*  HCT 35.6* 35.2*  PLT 237 223   BMET Recent Labs    04/20/18 0308 04/21/18 0317  NA 140 137  K 4.6 5.6*  CL 109 103  CO2 23 26  GLUCOSE 102* 127*  BUN 14 14  CREATININE 1.02* 1.04*  CALCIUM 9.0 9.3    COAG No results found for: INR, PROTIME No results found for: PTT  I have independently interviewed and examined patient and agree with PA assessment and plan above.  ABIs improved to 0.88 on the left with palpable pulses distally.  Kishon Garriga C. Donzetta Matters, MD [facility] [place] Office: [phone] Pager: [phone]

## 2018-04-22 MED ORDER — ALBUTEROL SULFATE (2.5 MG/3ML) 0.083% IN NEBU
3.0000 mL | INHALATION_SOLUTION | Freq: Three times a day (TID) | RESPIRATORY_TRACT | Status: DC
  Administered 2018-04-22 – 2018-04-24 (×5): 3 mL via RESPIRATORY_TRACT
  Filled 2018-04-22 (×5): qty 3

## 2018-04-22 NOTE — Care Management Important Message (Signed)
Important Message  Patient Details  Name: [name] MRN: [id number] Date of Birth: Mar 25, 1951   Medicare Important Message Given:  Yes    Barb Merino Portia 04/22/2018, 3:19 PM

## 2018-04-22 NOTE — Care Management Note (Signed)
Case Management Note Marvetta Gibbons RN, BSN Unit [address]E- RN Care Coordinator  [phone]  Patient Details  Name: [name] MRN: [id number] Date of Birth: Feb 22, 1951  Subjective/Objective:  Pt admitted s/p fempop bypass                 Action/Plan: PTA pt lived at home, notified by [facility] with [facility] pt has pre-op referral to them for any HH needs at time of transition home. Orders have been placed for HHPT.   Expected Discharge Date:                  Expected Discharge Plan:  [place]  In-House Referral:     Discharge planning Services  CM Consult  Post Acute Care Choice:  Home Health Choice offered to:  Patient  DME Arranged:    DME Agency:     HH Arranged:  PT HH Agency:  [facility]  Status of Service:  Completed, signed off  If discussed at [place] of Stay Meetings, dates discussed:    Discharge Disposition: home/home health   Additional Comments:  Dawayne Patricia, RN 04/22/2018, 11:01 AM

## 2018-04-22 NOTE — Progress Notes (Signed)
Occupational Therapy Treatment Patient Details Name: [name] MRN: [id number] DOB: 1951-04-10 Today's Date: 04/22/2018    History of present illness [name] is a 67yo female who comes to [facility] on 8/19 for vascular procedure at Rt common femoral artery and aortogram. Pt subseqeutnly underwent  Left femoral below knee popliteal bypass on 8/20. Pt reports baseline AMB limitations PTA d/t progressive leg pain and foot numbness while AMB.    OT comments  Pt making good progress with functional goals. OT will continue to follow acutely  Follow Up Recommendations  No OT follow up;Supervision/Assistance - 24 hour    Equipment Recommendations  None recommended by OT    Recommendations for Other Services      Precautions / Restrictions Precautions Precautions: Fall Precaution Comments: LLE incisions, coughing spells with congestion.  Restrictions Weight Bearing Restrictions: No       Mobility Bed Mobility Overal bed mobility: Modified Independent             General bed mobility comments: slow, guarded movement  Transfers Overall transfer level: Needs assistance Equipment used: Rolling walker (2 wheeled) Transfers: Sit to/from Stand Sit to Stand: Supervision         General transfer comment: supervision for safety    Balance Overall balance assessment: Mild deficits observed, not formally tested                                         ADL either performed or assessed with clinical judgement   ADL Overall ADL's : Needs assistance/impaired                     Lower Body Dressing: Set up;Supervision/safety;Sit to/from stand Lower Body Dressing Details (indicate cue type and reason): simulated for donning clothing, socks with sup Toilet Transfer: Supervision/safety;Ambulation;RW;Regular Toilet;Grab bars   Toileting- Clothing Manipulation and Hygiene: Supervision/safety;Sit to/from stand   Tub/ Shower Transfer:  Supervision/safety;Ambulation;Rolling walker;Grab bars;3 in 1   Functional mobility during ADLs: Supervision/safety;Rolling walker       Vision Baseline Vision/History: Wears glasses Wears Glasses: Reading only Patient Visual Report: No change from baseline     Perception     Praxis      Cognition Arousal/Alertness: Awake/alert Behavior During Therapy: WFL for tasks assessed/performed Overall Cognitive Status: Within Functional Limits for tasks assessed                                          Exercises     Shoulder Instructions       General Comments      Pertinent Vitals/ Pain       Pain Assessment: 0-10 Pain Score: 4  Pain Location: Left Leg Pain Descriptors / Indicators: Discomfort;Sore Pain Intervention(s): Monitored during session;Repositioned  Home Living                                          Prior Functioning/Environment              Frequency  Min 2X/week        Progress Toward Goals  OT Goals(current goals can now be found in the care plan section)  Progress towards OT goals: Progressing toward goals  Acute Rehab OT Goals Patient Stated Goal: "Walk and go home"  Plan Discharge plan remains appropriate    Co-evaluation                 AM-PAC PT "6 Clicks" Daily Activity     Outcome Measure   Help from another person eating meals?: None Help from another person taking care of personal grooming?: A Little Help from another person toileting, which includes using toliet, bedpan, or urinal?: A Little Help from another person bathing (including washing, rinsing, drying)?: A Little Help from another person to put on and taking off regular upper body clothing?: None Help from another person to put on and taking off regular lower body clothing?: A Little 6 Click Score: 20    End of Session Equipment Utilized During Treatment: Rolling walker;Gait belt;Other (comment)(3 in 1)  OT Visit  Diagnosis: Unsteadiness on feet (R26.81);Other abnormalities of gait and mobility (R26.89);Muscle weakness (generalized) (M62.81);Pain Pain - Right/Left: Left Pain - part of body: Leg   Activity Tolerance Patient tolerated treatment well   Patient Left in bed;with call bell/phone within reach   Nurse Communication      Functional Assessment Tool Used: AM-PAC 6 Clicks Daily Activity   Time: 7622-6333 OT Time Calculation (min): 24 min  Charges: OT General Charges $OT Visit: 1 Visit OT Treatments $Self Care/Home Management : 8-22 mins $Therapeutic Activity: 8-22 mins     Britt Bottom 04/22/2018, 3:24 PM

## 2018-04-22 NOTE — Progress Notes (Signed)
PT Cancellation Note  Patient Details Name: [name] MRN: [id number] DOB: [birth year]   Cancelled Treatment:    Reason Eval/Treat Not Completed: Medical issues which prohibited therapy(Per last labs, pt hyperkalemia, K: 5.6, outside of safe range to participate in exersional activity. Will continue to follow and see pt when more appropriate. )  9:54 AM, 04/22/18 Etta Grandchild, PT, DPT Physical Therapist - [place] [phone] (Pager)  [phone] (Office)     Buccola,Allan C 04/22/2018, 9:53 AM

## 2018-04-22 NOTE — Progress Notes (Signed)
  Progress Note    04/22/2018 8:45 AM 2 Days Post-Op  Subjective:  not feeling very well this morning  Vitals:   04/22/18 0325 04/22/18 0734  BP: 123/68   Pulse: (!) 110   Resp:    Temp: 99 F (37.2 C)   SpO2: 99% 99%    Physical Exam: Awake alert oriented Nonlabored respirations Incisions left lower extremity clean dry intact Palpable left PT and DP pulses   CBC    Component Value Date/Time   WBC 12.3 (H) 04/21/2018 0317   RBC 3.72 (L) 04/21/2018 0317   HGB 11.0 (L) 04/21/2018 0317   HCT 35.2 (L) 04/21/2018 0317   PLT 223 04/21/2018 0317   MCV 94.6 04/21/2018 0317   MCH 29.6 04/21/2018 0317   MCHC 31.3 04/21/2018 0317   RDW 13.9 04/21/2018 0317   LYMPHSABS 1.2 09/15/2017 1021   MONOABS 0.2 09/15/2017 1021   EOSABS 0.0 09/15/2017 1021   BASOSABS 0.0 09/15/2017 1021    BMET    Component Value Date/Time   NA 137 04/21/2018 0317   K 5.6 (H) 04/21/2018 0317   CL 103 04/21/2018 0317   CO2 26 04/21/2018 0317   GLUCOSE 127 (H) 04/21/2018 0317   BUN 14 04/21/2018 0317   CREATININE 1.04 (H) 04/21/2018 0317   CREATININE 0.79 08/15/2013 1446   CALCIUM 9.3 04/21/2018 0317   GFRNONAA 55 (L) 04/21/2018 0317   GFRAA >60 04/21/2018 0317    INR No results found for: INR   Intake/Output Summary (Last 24 hours) at 04/22/2018 0845 Last data filed at 04/22/2018 0328 Gross per 24 hour  Intake 1083 ml  Output -  Net 1083 ml     Assessment:  67 y.o. female is s/p left femoropopliteal bypass with graft for rest pain  Plan: Plan will be for home health physical therapy. Despite a home in the next couple days when progressing better. Subcu heparin.  Karys Meckley C. Donzetta Matters, MD [facility] [place] Office: [phone] Pager: [phone]  04/22/2018 8:45 AM

## 2018-04-23 ENCOUNTER — Telehealth: Payer: Self-pay | Admitting: Vascular Surgery

## 2018-04-23 LAB — BASIC METABOLIC PANEL
Anion gap: 9 (ref 5–15)
BUN: 15 mg/dL (ref 8–23)
CO2: 27 mmol/L (ref 22–32)
Calcium: 9.5 mg/dL (ref 8.9–10.3)
Chloride: 103 mmol/L (ref 98–111)
Creatinine, Ser: 1.02 mg/dL — ABNORMAL HIGH (ref 0.44–1.00)
GFR calc Af Amer: >60 mL/min (ref >60)
GFR, EST NON AFRICAN AMERICAN: 56 mL/min — AB (ref >60)
Glucose, Bld: 109 mg/dL — ABNORMAL HIGH (ref 70–99)
Potassium: 4.3 mmol/L (ref 3.5–5.1)
Sodium: 139 mmol/L (ref 135–145)

## 2018-04-23 MED ORDER — SODIUM POLYSTYRENE SULFONATE 15 GM/60ML PO SUSP
30.0000 g | Freq: Once | ORAL | Status: AC
  Administered 2018-04-23: 30 g via ORAL
  Filled 2018-04-23: qty 120

## 2018-04-23 MED ORDER — OXYCODONE HCL 5 MG PO TABS: 5.0000 mg | ORAL_TABLET | Freq: Four times a day (QID) | ORAL | 0 refills | Status: DC | PRN

## 2018-04-23 NOTE — Telephone Encounter (Signed)
sch appt mld ltr 05/07/18 930am p/o MD

## 2018-04-23 NOTE — Discharge Instructions (Signed)
 [facility] [place]  Discharge instructions  Lower Extremity Bypass Surgery  Please refer to the following instruction for your post-procedure care. Your surgeon or physician assistant will discuss any changes with you.  Activity  You are encouraged to walk as much as you can. You can slowly return to normal activities during the month after your surgery. Avoid strenuous activity and heavy lifting until your doctor tells you it's OK. Avoid activities such as vacuuming or swinging a golf club. Do not drive until your doctor give the OK and you are no longer taking prescription pain medications. It is also normal to have difficulty with sleep habits, eating and bowel movement after surgery. These will go away with time.  Bathing/Showering  You may shower after you go home. Do not soak in a bathtub, hot tub, or swim until the incision heals completely.  Incision Care  Clean your incision with mild soap and water. Shower every day. Pat the area dry with a clean towel. You do not need a bandage unless otherwise instructed. Do not apply any ointments or creams to your incision. If you have open wounds you will be instructed how to care for them or a visiting nurse may be arranged for you. If you have staples or sutures along your incision they will be removed at your post-op appointment. You may have skin glue on your incision. Do not peel it off. It will come off on its own in about one week. If you have a great deal of moisture in your groin, use a gauze help keep this area dry.  Diet  Resume your normal diet. There are no special food restrictions following this procedure. A low fat/ low cholesterol diet is recommended for all patients with vascular disease. In order to heal from your surgery, it is CRITICAL to get adequate nutrition. Your body requires vitamins, minerals, and protein. Vegetables are the best source of vitamins and minerals. Vegetables also provide the  perfect balance of protein. Processed food has little nutritional value, so try to avoid this.  Medications  Resume taking all your medications unless your doctor or nurse practitioner tells you not to. If your incision is causing pain, you may take over-the-counter pain relievers such as acetaminophen (Tylenol). If you were prescribed a stronger pain medication, please aware these medication can cause nausea and constipation. Prevent nausea by taking the medication with a snack or meal. Avoid constipation by drinking plenty of fluids and eating foods with high amount of fiber, such as fruits, vegetables, and grains. Take Colase 100 mg (an over-the-counter stool softener) twice a day as needed for constipation. Do not take Tylenol if you are taking prescription pain medications.  Follow Up  Our office will schedule a follow up appointment 2-3 weeks following discharge.  Please call us immediately for any of the following conditions  Severe or worsening pain in your legs or feet while at rest or while walking Increase pain, redness, warmth, or drainage (pus) from your incision site(s) Fever of 101 degree or higher The swelling in your leg with the bypass suddenly worsens and becomes more painful than when you were in the hospital If you have been instructed to feel your graft pulse then you should do so every day. If you can no longer feel this pulse, call the office immediately. Not all patients are given this instruction.  Leg swelling is common after leg bypass surgery.  The swelling should improve over a few months   following surgery. To improve the swelling, you may elevate your legs above the level of your heart while you are sitting or resting. Your surgeon or physician assistant may ask you to apply an ACE wrap or wear compression (TED) stockings to help to reduce swelling.  Reduce your risk of vascular disease  Stop smoking. If you would like help call [facility] at 1-800-QUIT-NOW  (1-800-784-8669) or [facility] at 336-586-4000.  Manage your cholesterol Maintain a desired weight Control your diabetes weight Control your diabetes Keep your blood pressure down  If you have any questions, please call the office at [phone]   

## 2018-04-23 NOTE — Progress Notes (Signed)
Physical Therapy Treatment Patient Details Name: [name] MRN: [id number] DOB: [birth year] Today's Date: 04/23/2018    History of Present Illness [name] is a 67yo female who comes to [facility] on 8/19 for vascular procedure at Rt common femoral artery and aortogram. Pt subseqeutnly underwent  Left femoral below knee popliteal bypass on 8/20. Pt reports baseline AMB limitations PTA d/t progressive leg pain and foot numbness while AMB.     PT Comments    Progressing with ambulation distance and reports this is second walk today.  Did not wish to practice stairs this pm.  States family has arranged so she can enter through back door with only 2 steps (no rails).  Remembers going with backwards before.  Will continue skilled PT to progress to goals.   Follow Up Recommendations  Home health PT     Equipment Recommendations  None recommended by PT(states has a walker at home)    Recommendations for Other Services       Precautions / Restrictions Precautions Precautions: Fall    Mobility  Bed Mobility Overal bed mobility: Modified Independent                Transfers Overall transfer level: Needs assistance Equipment used: Rolling walker (2 wheeled) Transfers: Sit to/from Stand Sit to Stand: Supervision         General transfer comment: increased time, changing hand positions for success  Ambulation/Gait Ambulation/Gait assistance: Supervision Gait Distance (Feet): 200 Feet Assistive device: Rolling walker (2 wheeled) Gait Pattern/deviations: Step-through pattern;Decreased stride length     General Gait Details: slow pace, but steady in hallway with walker, did walk around bed to window and closet no AD with close S/minguard for safety   Stairs             Wheelchair Mobility    Modified Rankin (Stroke Patients Only)       Balance Overall balance assessment: Mild deficits observed, not formally tested                                           Cognition Arousal/Alertness: Awake/alert Behavior During Therapy: WFL for tasks assessed/performed Overall Cognitive Status: Within Functional Limits for tasks assessed                                        Exercises      General Comments        Pertinent Vitals/Pain Pain Assessment: Faces Faces Pain Scale: Hurts a little bit Pain Location: Left Leg Pain Descriptors / Indicators: Sore Pain Intervention(s): Premedicated before session;Monitored during session(states RN gave meds recently)    Home Living                      Prior Function            PT Goals (current goals can now be found in the care plan section) Progress towards PT goals: Progressing toward goals    Frequency    Min 3X/week      PT Plan Current plan remains appropriate;Equipment recommendations need to be updated    Co-evaluation              AM-PAC PT "6 Clicks" Daily Activity  Outcome Measure  Difficulty turning over in bed (including adjusting  bedclothes, sheets and blankets)?: None Difficulty moving from lying on back to sitting on the side of the bed? : A Little Difficulty sitting down on and standing up from a chair with arms (e.g., wheelchair, bedside commode, etc,.)?: A Little Help needed moving to and from a bed to chair (including a wheelchair)?: A Little Help needed walking in hospital room?: A Little Help needed climbing 3-5 steps with a railing? : A Little 6 Click Score: 19    End of Session Equipment Utilized During Treatment: Gait belt Activity Tolerance: Patient tolerated treatment well Patient left: in bed;with call bell/phone within reach   PT Visit Diagnosis: Difficulty in walking, not elsewhere classified (R26.2);Muscle weakness (generalized) (M62.81)     Time: 1191-4782 PT Time Calculation (min) (ACUTE ONLY): 21 min  Charges:  $Gait Training: 8-22 mins                     [place],  [place] [phone] 04/23/2018    [name] 04/23/2018, 5:00 PM

## 2018-04-23 NOTE — Progress Notes (Addendum)
  Progress Note    04/23/2018 7:15 AM 3 Days Post-Op  Subjective:  Patient says she is still not feeling well   Vitals:   04/22/18 2039 04/23/18 0437  BP:  104/74  Pulse:    Resp:  18  Temp:  98.5 F (36.9 C)  SpO2: 98% 98%   Physical Exam: Cardiac:  RRR Lungs:  Non labored; cough with deep breathing Incisions:  L groin and popliteal incisions are soft without hematoma or drainage Extremities:  Palpable L PT and DP Abdomen:  Soft Neurologic: A&O  CBC    Component Value Date/Time   WBC 12.3 (H) 04/21/2018 0317   RBC 3.72 (L) 04/21/2018 0317   HGB 11.0 (L) 04/21/2018 0317   HCT 35.2 (L) 04/21/2018 0317   PLT 223 04/21/2018 0317   MCV 94.6 04/21/2018 0317   MCH 29.6 04/21/2018 0317   MCHC 31.3 04/21/2018 0317   RDW 13.9 04/21/2018 0317   LYMPHSABS 1.2 09/15/2017 1021   MONOABS 0.2 09/15/2017 1021   EOSABS 0.0 09/15/2017 1021   BASOSABS 0.0 09/15/2017 1021    BMET    Component Value Date/Time   NA 137 04/21/2018 0317   K 5.6 (H) 04/21/2018 0317   CL 103 04/21/2018 0317   CO2 26 04/21/2018 0317   GLUCOSE 127 (H) 04/21/2018 0317   BUN 14 04/21/2018 0317   CREATININE 1.04 (H) 04/21/2018 0317   CREATININE 0.79 08/15/2013 1446   CALCIUM 9.3 04/21/2018 0317   GFRNONAA 55 (L) 04/21/2018 0317   GFRAA >60 04/21/2018 0317    INR No results found for: INR  No intake or output data in the 24 hours ending 04/23/18 0715   Assessment/Plan:  67 y.o. female is s/p L fem-pop with PTFE 3 Days Post-Op   Patent bypass with palpable L DP and PT Incisions healing well Case manager arranged HHPT Will check back this afternoon for potential d/c home  DVT prophylaxis:  subq heparin  Dagoberto Ligas, PA-C [facility] [phone] 04/23/2018 7:15 AM  I have independently interviewed and patient and agree with PA assessment and plan above.  Patient is generally feeling unwell but overall appears well.  She will need home health physical therapy.  She  has had audible issues with anesthesia in the past this is not surprising.  Continue out of bed diet as tolerates.  We will recheck labs if she has here in the morning.  Antonae Zbikowski C. Donzetta Matters, MD [facility] [place] Office: [phone] Pager: [phone]

## 2018-04-24 LAB — CBC WITH DIFFERENTIAL/PLATELET
Abs Immature Granulocytes: 0 10*3/uL (ref 0.0–0.1)
Basophils Absolute: 0 10*3/uL (ref 0.0–0.1)
Basophils Relative: 0 %
EOS ABS: 0.1 10*3/uL (ref 0.0–0.7)
EOS PCT: 1 %
HEMATOCRIT: 33 % — AB (ref 36.0–46.0)
Hemoglobin: 10.3 g/dL — ABNORMAL LOW (ref 12.0–15.0)
IMMATURE GRANULOCYTES: 1 %
LYMPHS ABS: 3.4 10*3/uL (ref 0.7–4.0)
LYMPHS PCT: 45 %
MCH: 29.2 pg (ref 26.0–34.0)
MCHC: 31.2 g/dL (ref 30.0–36.0)
MCV: 93.5 fL (ref 78.0–100.0)
MONOS PCT: 7 %
Monocytes Absolute: 0.5 10*3/uL (ref 0.1–1.0)
Neutro Abs: 3.5 10*3/uL (ref 1.7–7.7)
Neutrophils Relative %: 46 %
Platelets: 236 10*3/uL (ref 150–400)
RBC: 3.53 MIL/uL — AB (ref 3.87–5.11)
RDW: 14 % (ref 11.5–15.5)
WBC: 7.6 10*3/uL (ref 4.0–10.5)

## 2018-04-24 NOTE — Progress Notes (Signed)
Notified [facility] thatpatient will DC to home today. Patient has been pre operatively set up. No other CM needs identified.

## 2018-04-24 NOTE — Progress Notes (Signed)
D/c instructions given to pt and granddaughter. Wound care reviewed. IV removed, clean and intact. Granddaughter to escort home.  Clyde Canterbury, RN

## 2018-04-24 NOTE — Discharge Summary (Signed)
Physician Discharge Summary  Patient ID: [name] MRN: [id number] DOB/AGE: [birth year] 67 y.o.  Admit date: 04/19/2018 Discharge date: 04/24/2018  Admission Diagnosis: Peripheral arterial disease with left lower extremity rest pain  Discharge Diagnoses:  Same  Secondary Diagnoses: Active Problems:   PAD (peripheral artery disease) ([place]). left leg   Procedures: Left femoral to below-knee popliteal artery bypass with 6 mm PTFE  Discharged Condition: fair  Hospital Course: Patient was admitted to undergo angiogram of her bilateral lower extremities to evaluate for bilateral lower extremity rest pain.  She tolerated that well and the following day underwent left femoral to below-knee popliteal artery bypass with graft.  She also tolerated this well and had postoperative palpable pedal pulses on the left side.  She did have some malaise and general feeling unwell in her hospital course which is her typical response to anesthesia but by the day of discharge she was feeling well and excited for discharge.  Consults:  None  Significant Diagnostic Studies: CBC CBC Latest Ref Rng & Units 04/24/2018 04/21/2018 04/20/2018  WBC 4.0 - 10.5 K/uL 7.6 12.3(H) 6.1  Hemoglobin 12.0 - 15.0 g/dL 10.3(L) 11.0(L) 11.1(L)  Hematocrit 36.0 - 46.0 % 33.0(L) 35.2(L) 35.6(L)  Platelets 150 - 400 K/uL 236 223 237      Disposition: Home with home health physical therapy  Discharge Instructions    Call MD for:  redness, tenderness, or signs of infection (pain, swelling, bleeding, redness, odor or green/yellow discharge around incision site)   Complete by:  As directed    Call MD for:  severe or increased pain, loss or decreased feeling  in affected limb(s)   Complete by:  As directed    Call MD for:  temperature >100.5   Complete by:  As directed    Resume previous diet   Complete by:  As directed      Allergies as of 04/24/2018   No Known Allergies     Medication List    TAKE these  medications   aspirin EC 81 MG tablet Take 81 mg by mouth daily.   atorvastatin 80 MG tablet Commonly known as:  LIPITOR TAKE 1 TABLET BY MOUTH ONCE DAILY AT 6 PM   clopidogrel 75 MG tablet Commonly known as:  PLAVIX Take 1 tablet (75 mg total) by mouth daily.   Fluticasone-Umeclidin-Vilant 100-62.5-25 MCG/INH Aepb Inhale 1 puff into the lungs daily.   meloxicam 15 MG tablet Commonly known as:  MOBIC TAKE 1 TABLET BY MOUTH EVERY DAY AS NEEDED FOR FOOT PAIN. TAKE WITH FOOD   oxyCODONE 5 MG immediate release tablet Commonly known as:  Oxy IR/ROXICODONE Take 1 tablet (5 mg total) by mouth every 6 (six) hours as needed for moderate pain.   promethazine 25 MG tablet Commonly known as:  PHENERGAN TAKE 1 TABLET BY MOUTH EVERY 6 HOURS IF NEEDED FOR NAUSEA AND VOMITING   VENTOLIN HFA 108 (90 Base) MCG/ACT inhaler Generic drug:  albuterol INHALE 2 PUFFS BY MOUTH EVERY 4 HOURS IF NEEDED FOR WHEEZING OR SHORTNESS OF BREATH      Follow-up Information    [facility] Follow up.   Specialty:  [place] Why:  Pre-op referral done by vascular office for [facility] needs- they will follow up post discharge- HHPT Contact information: [place] [address] [phone]        Waynetta Sandy, MD Follow up in 2 week(s).   Specialties:  Vascular Surgery, Cardiology Contact information: [place] [address] [phone]           Signed:  Eda Paschal. Donzetta Matters, MD [facility] [place] Office: [phone] Pager: [phone]  04/24/2018, 6:25 PM

## 2018-04-24 NOTE — Progress Notes (Signed)
Physical Therapy Treatment Patient Details Name: [name] MRN: [id number] DOB: 1951/01/27 Today's Date: 04/24/2018    History of Present Illness [name] is a 67yo female who comes to [facility] on 8/19 for vascular procedure at [facility] common femoral artery and aortogram. Pt subseqeutnly underwent  Left femoral below knee popliteal bypass on 8/20. Pt reports baseline AMB limitations PTA d/t progressive leg pain and foot numbness while AMB.     PT Comments    Pt performed gait training and progression to stair training for safe entry into home this afternoon.  Pt is slow and guarded with noticeable fatigue.  Pt required cues for upper trunk control, pacing and safety.  [place] daughter present in room reports she is patient's ride home.  Informed pt's RN that she will need a RW to d/c home and she does not have device at home.  Pt waiting in room for delivery of device and d/c to her home.    Follow Up Recommendations  Home health PT     Equipment Recommendations  Rolling walker with 5" wheels(Pt now reports she does not have a RW at home and she will need device for d/c.  )    Recommendations for Other Services       Precautions / Restrictions Precautions Precautions: Fall Restrictions Weight Bearing Restrictions: No    Mobility  Bed Mobility Overal bed mobility: Modified Independent             General bed mobility comments: slow, guarded movement  Transfers Overall transfer level: Needs assistance Equipment used: Rolling walker (2 wheeled) Transfers: Sit to/from Stand Sit to Stand: Supervision         General transfer comment: Cues for hand placement  Ambulation/Gait Ambulation/Gait assistance: Supervision Gait Distance (Feet): 300 Feet Assistive device: Rolling walker (2 wheeled) Gait Pattern/deviations: Step-through pattern;Decreased stride length;Antalgic;Trunk flexed     General Gait Details: slow pace, but steady in hallway with walker, Pt required cues  for upper trunk control and gt symmetry.  x2 standing breaks with increased WOB.  She fatigues easily.     Stairs Stairs: Yes Stairs assistance: Min assist Stair Management: No rails;Backwards;Step to pattern Number of Stairs: 2 General stair comments: Cues for sequencing, RW placement to negotiate x2 stairs without railings.     Wheelchair Mobility    Modified Rankin (Stroke Patients Only)       Balance Overall balance assessment: Mild deficits observed, not formally tested                                          Cognition Arousal/Alertness: Awake/alert Behavior During Therapy: WFL for tasks assessed/performed Overall Cognitive Status: Within Functional Limits for tasks assessed                                        Exercises      General Comments        Pertinent Vitals/Pain Pain Assessment: 0-10 Pain Score: 4  Pain Location: Left Leg Pain Descriptors / Indicators: Sore Pain Intervention(s): Monitored during session;Repositioned    Home Living                      Prior Function            PT Goals (current goals  can now be found in the care plan section) Acute Rehab PT Goals Patient Stated Goal: "Walk and go home" Potential to Achieve Goals: Good Progress towards PT goals: Progressing toward goals    Frequency    Min 3X/week      PT Plan Discharge plan needs to be updated    Co-evaluation              AM-PAC PT "6 Clicks" Daily Activity  Outcome Measure  Difficulty turning over in bed (including adjusting bedclothes, sheets and blankets)?: None Difficulty moving from lying on back to sitting on the side of the bed? : A Little Difficulty sitting down on and standing up from a chair with arms (e.g., wheelchair, bedside commode, etc,.)?: A Little Help needed moving to and from a bed to chair (including a wheelchair)?: A Little Help needed walking in hospital room?: A Little Help needed  climbing 3-5 steps with a railing? : A Little 6 Click Score: 19    End of Session Equipment Utilized During Treatment: Gait belt Activity Tolerance: Patient tolerated treatment well Patient left: in bed;with call bell/phone within reach Nurse Communication: Mobility status PT Visit Diagnosis: Difficulty in walking, not elsewhere classified (R26.2);Muscle weakness (generalized) (M62.81)     Time: 7195-9747 PT Time Calculation (min) (ACUTE ONLY): 13 min  Charges:  $Gait Training: 8-22 mins                     Governor Rooks, PTA pager [phone]    Cristela Blue 04/24/2018, 11:57 AM

## 2018-04-24 NOTE — Progress Notes (Signed)
  Progress Note    04/24/2018 9:43 AM 4 Days Post-Op  Subjective: Feeling much better this morning  Vitals:   04/24/18 0759 04/24/18 0821  BP:  116/77  Pulse:  93  Resp:    Temp:  97.7 F (36.5 C)  SpO2: 96% 100%    Physical Exam: Awake alert oriented Nonlabored respirations Incisions left lower extremity clean dry intact Palpable dorsalis pedis posterior tibial pulses  CBC    Component Value Date/Time   WBC 7.6 04/24/2018 0331   RBC 3.53 (L) 04/24/2018 0331   HGB 10.3 (L) 04/24/2018 0331   HCT 33.0 (L) 04/24/2018 0331   PLT 236 04/24/2018 0331   MCV 93.5 04/24/2018 0331   MCH 29.2 04/24/2018 0331   MCHC 31.2 04/24/2018 0331   RDW 14.0 04/24/2018 0331   LYMPHSABS 3.4 04/24/2018 0331   MONOABS 0.5 04/24/2018 0331   EOSABS 0.1 04/24/2018 0331   BASOSABS 0.0 04/24/2018 0331    BMET    Component Value Date/Time   NA 139 04/23/2018 0921   K 4.3 04/23/2018 0921   CL 103 04/23/2018 0921   CO2 27 04/23/2018 0921   GLUCOSE 109 (H) 04/23/2018 0921   BUN 15 04/23/2018 0921   CREATININE 1.02 (H) 04/23/2018 0921   CREATININE 0.79 08/15/2013 1446   CALCIUM 9.5 04/23/2018 0921   GFRNONAA 56 (L) 04/23/2018 0921   GFRAA >60 04/23/2018 0921    INR No results found for: INR  No intake or output data in the 24 hours ending 04/24/18 0943   Assessment:  67 y.o. female is s/p left femoropopliteal bypass for rest pain Plan: Okay for discharge today We will follow-up in 4 to 6 weeks with ABIs left lower extremity duplex I have urged her to quit smoking Continue aspirin and statin drugs Will need consideration of right lower extremity bypass in the future when she has recovered from this given that she has occluded stents on that side.   Raylen Tangonan C. Donzetta Matters, MD [facility] Office: [phone] Pager: [phone]  04/24/2018 9:43 AM

## 2018-04-24 NOTE — Care Management (Signed)
RW order placed and requested to be delivered to room by [facility] prior to DC today

## 2018-04-25 DIAGNOSIS — I739 Peripheral vascular disease, unspecified: Secondary | ICD-10-CM | POA: Diagnosis not present

## 2018-04-26 ENCOUNTER — Telehealth: Payer: Self-pay | Admitting: Vascular Surgery

## 2018-04-26 NOTE — Telephone Encounter (Signed)
sch appt spk to pt 10am ABI 11am LE Bypass  1130am p/o MD

## 2018-04-27 ENCOUNTER — Other Ambulatory Visit: Payer: Self-pay

## 2018-04-27 DIAGNOSIS — I70209 Unspecified atherosclerosis of native arteries of extremities, unspecified extremity: Secondary | ICD-10-CM

## 2018-04-27 DIAGNOSIS — I739 Peripheral vascular disease, unspecified: Secondary | ICD-10-CM

## 2018-04-28 DIAGNOSIS — I739 Peripheral vascular disease, unspecified: Secondary | ICD-10-CM | POA: Diagnosis not present

## 2018-05-01 ENCOUNTER — Other Ambulatory Visit: Payer: Self-pay | Admitting: Internal Medicine

## 2018-05-04 DIAGNOSIS — J439 Emphysema, unspecified: Secondary | ICD-10-CM | POA: Diagnosis not present

## 2018-05-04 DIAGNOSIS — F1290 Cannabis use, unspecified, uncomplicated: Secondary | ICD-10-CM | POA: Diagnosis not present

## 2018-05-04 DIAGNOSIS — M545 Low back pain: Secondary | ICD-10-CM | POA: Diagnosis not present

## 2018-05-04 DIAGNOSIS — G8929 Other chronic pain: Secondary | ICD-10-CM | POA: Diagnosis not present

## 2018-05-04 DIAGNOSIS — Z48812 Encounter for surgical aftercare following surgery on the circulatory system: Secondary | ICD-10-CM | POA: Diagnosis not present

## 2018-05-04 DIAGNOSIS — I700 Atherosclerosis of aorta: Secondary | ICD-10-CM | POA: Diagnosis not present

## 2018-05-04 DIAGNOSIS — L409 Psoriasis, unspecified: Secondary | ICD-10-CM | POA: Diagnosis not present

## 2018-05-04 DIAGNOSIS — I70422 Atherosclerosis of autologous vein bypass graft(s) of the extremities with rest pain, left leg: Secondary | ICD-10-CM | POA: Diagnosis not present

## 2018-05-04 DIAGNOSIS — F17210 Nicotine dependence, cigarettes, uncomplicated: Secondary | ICD-10-CM | POA: Diagnosis not present

## 2018-05-06 ENCOUNTER — Other Ambulatory Visit: Payer: Self-pay | Admitting: Family Medicine

## 2018-05-07 ENCOUNTER — Encounter: Payer: Medicare HMO | Admitting: Vascular Surgery

## 2018-05-10 DIAGNOSIS — J439 Emphysema, unspecified: Secondary | ICD-10-CM | POA: Diagnosis not present

## 2018-05-10 DIAGNOSIS — M545 Low back pain: Secondary | ICD-10-CM | POA: Diagnosis not present

## 2018-05-10 DIAGNOSIS — G8929 Other chronic pain: Secondary | ICD-10-CM | POA: Diagnosis not present

## 2018-05-10 DIAGNOSIS — F1290 Cannabis use, unspecified, uncomplicated: Secondary | ICD-10-CM | POA: Diagnosis not present

## 2018-05-10 DIAGNOSIS — F17210 Nicotine dependence, cigarettes, uncomplicated: Secondary | ICD-10-CM | POA: Diagnosis not present

## 2018-05-10 DIAGNOSIS — Z48812 Encounter for surgical aftercare following surgery on the circulatory system: Secondary | ICD-10-CM | POA: Diagnosis not present

## 2018-05-10 DIAGNOSIS — L409 Psoriasis, unspecified: Secondary | ICD-10-CM | POA: Diagnosis not present

## 2018-05-10 DIAGNOSIS — I70422 Atherosclerosis of autologous vein bypass graft(s) of the extremities with rest pain, left leg: Secondary | ICD-10-CM | POA: Diagnosis not present

## 2018-05-10 DIAGNOSIS — I700 Atherosclerosis of aorta: Secondary | ICD-10-CM | POA: Diagnosis not present

## 2018-05-12 ENCOUNTER — Other Ambulatory Visit: Payer: Self-pay | Admitting: Internal Medicine

## 2018-05-13 ENCOUNTER — Other Ambulatory Visit: Payer: Self-pay | Admitting: Family Medicine

## 2018-05-18 ENCOUNTER — Telehealth: Payer: Self-pay

## 2018-05-18 DIAGNOSIS — M545 Low back pain: Secondary | ICD-10-CM | POA: Diagnosis not present

## 2018-05-18 DIAGNOSIS — I70422 Atherosclerosis of autologous vein bypass graft(s) of the extremities with rest pain, left leg: Secondary | ICD-10-CM | POA: Diagnosis not present

## 2018-05-18 DIAGNOSIS — G8929 Other chronic pain: Secondary | ICD-10-CM | POA: Diagnosis not present

## 2018-05-18 DIAGNOSIS — I700 Atherosclerosis of aorta: Secondary | ICD-10-CM | POA: Diagnosis not present

## 2018-05-18 DIAGNOSIS — F1290 Cannabis use, unspecified, uncomplicated: Secondary | ICD-10-CM | POA: Diagnosis not present

## 2018-05-18 DIAGNOSIS — Z48812 Encounter for surgical aftercare following surgery on the circulatory system: Secondary | ICD-10-CM | POA: Diagnosis not present

## 2018-05-18 DIAGNOSIS — J439 Emphysema, unspecified: Secondary | ICD-10-CM | POA: Diagnosis not present

## 2018-05-18 DIAGNOSIS — L409 Psoriasis, unspecified: Secondary | ICD-10-CM | POA: Diagnosis not present

## 2018-05-18 DIAGNOSIS — F17210 Nicotine dependence, cigarettes, uncomplicated: Secondary | ICD-10-CM | POA: Diagnosis not present

## 2018-05-18 NOTE — Telephone Encounter (Signed)
Olivia Mackie, RN with [facility], LVM on nurse line requesting an apt for patient. Pre VM, pt has a sore throat, cough, HA and congestion, denies fever. I tried calling Olivia Mackie back, per her request, "not a working number." I called the patient and scheduled her for 9/20 at patients request.

## 2018-05-21 ENCOUNTER — Other Ambulatory Visit: Payer: Self-pay | Admitting: Family Medicine

## 2018-05-21 ENCOUNTER — Ambulatory Visit (INDEPENDENT_AMBULATORY_CARE_PROVIDER_SITE_OTHER): Payer: Medicare HMO | Admitting: Family Medicine

## 2018-05-21 ENCOUNTER — Telehealth: Payer: Self-pay

## 2018-05-21 ENCOUNTER — Other Ambulatory Visit: Payer: Self-pay

## 2018-05-21 VITALS — BP 146/72 | HR 83 | Temp 98.4°F | Ht 66.5 in | Wt 161.0 lb

## 2018-05-21 DIAGNOSIS — I70422 Atherosclerosis of autologous vein bypass graft(s) of the extremities with rest pain, left leg: Secondary | ICD-10-CM | POA: Diagnosis not present

## 2018-05-21 DIAGNOSIS — J439 Emphysema, unspecified: Secondary | ICD-10-CM | POA: Diagnosis not present

## 2018-05-21 DIAGNOSIS — L409 Psoriasis, unspecified: Secondary | ICD-10-CM | POA: Diagnosis not present

## 2018-05-21 DIAGNOSIS — J0110 Acute frontal sinusitis, unspecified: Secondary | ICD-10-CM

## 2018-05-21 DIAGNOSIS — F1290 Cannabis use, unspecified, uncomplicated: Secondary | ICD-10-CM | POA: Diagnosis not present

## 2018-05-21 DIAGNOSIS — M545 Low back pain: Secondary | ICD-10-CM | POA: Diagnosis not present

## 2018-05-21 DIAGNOSIS — F17210 Nicotine dependence, cigarettes, uncomplicated: Secondary | ICD-10-CM | POA: Diagnosis not present

## 2018-05-21 DIAGNOSIS — Z48812 Encounter for surgical aftercare following surgery on the circulatory system: Secondary | ICD-10-CM | POA: Diagnosis not present

## 2018-05-21 DIAGNOSIS — I700 Atherosclerosis of aorta: Secondary | ICD-10-CM | POA: Diagnosis not present

## 2018-05-21 DIAGNOSIS — G8929 Other chronic pain: Secondary | ICD-10-CM | POA: Diagnosis not present

## 2018-05-21 HISTORY — DX: Acute frontal sinusitis, unspecified: J01.10

## 2018-05-21 MED ORDER — FLUTICASONE-UMECLIDIN-VILANT 100-62.5-25 MCG/INH IN AEPB: 1.0000 | INHALATION_SPRAY | Freq: Every day | RESPIRATORY_TRACT | 0 refills | Status: DC

## 2018-05-21 MED ORDER — IPRATROPIUM BROMIDE 0.03 % NA SOLN: 2.0000 | Freq: Two times a day (BID) | NASAL | 12 refills | Status: DC

## 2018-05-21 MED ORDER — GUAIFENESIN 100 MG/5ML PO SOLN: 5.0000 mL | ORAL | 0 refills | Status: DC | PRN

## 2018-05-21 MED ORDER — AMOXICILLIN-POT CLAVULANATE ER 1000-62.5 MG PO TB12: 2.0000 | ORAL_TABLET | Freq: Two times a day (BID) | ORAL | 0 refills | Status: DC

## 2018-05-21 MED ORDER — AMOXICILLIN-POT CLAVULANATE 875-125 MG PO TABS: 1.0000 | ORAL_TABLET | Freq: Two times a day (BID) | ORAL | 0 refills | Status: DC

## 2018-05-21 NOTE — Progress Notes (Signed)
Subjective: Chief Complaint  Patient presents with  . URI     HPI: [name] is a 67 y.o. presenting to clinic today to discuss the following:  Cough, Congestion, Sore Throat Patient with PMH of tobacco use and COPD presents with 2 weeks of productive cough with greenish sputum, congestion, and sore throat. She also endorses a subjective fever, chills, and night sweats at home. She has stable vitals with 99% pulse ox and normal temperature today. She has tried over the counter Robitussin with no improvement.    Health Maintenance: none     ROS noted in HPI.   Past Medical, Surgical, Social, and Family History Reviewed & Updated per EMR.   Pertinent Historical Findings include:   Social History   Tobacco Use  Smoking Status Current Every Day Smoker  . Packs/day: 0.25  . Years: 47.00  . Pack years: 11.75  . Types: Cigarettes  Smokeless Tobacco Never Used  Tobacco Comment   3-4 cigarettes per day    Objective: BP (!) 146/72   Pulse 83   Temp 98.4 F (36.9 C) (Oral)   Ht 5' 6.5" (1.689 m)   Wt 161 lb (73 kg)   SpO2 99%   BMI 25.60 kg/m  Vitals and nursing notes reviewed  Physical Exam Gen: Alert and Oriented x 3, NAD HEENT: Normocephalic, atraumatic, PERRLA, EOMI, swollen, erythematous turbinates, non-erythematous pharyngeal mucosa, no exudates Neck: no thyroidmegaly, no LAD CV: RRR, no murmurs, normal S1, S2 split Resp: CTAB, no wheezing, rales, or rhonchi, comfortable work of breathing MSK: Moves all four extremities Ext: no clubbing, cyanosis, or edema Skin: warm, dry, intact, no rashes  No results found for this or any previous visit (from the past 72 hour(s)).  Assessment/Plan:  Acute non-recurrent frontal sinusitis Considered PNA vs viral vs acute bacterial sinusitis vs step throat. Patient has risk factors for PNA and her CURB-65 score was moderate. Reassured it is not PNA due to stable vital signs and physical exam. Most likely bacterial  sinusitis given patient has had ongoing symptoms for 2 weeks with no improvement with OTC medications.  Augmentin 2g/125mg  twice a day for 7 days given she is >65. Strict return precautions discussed with patient. Ipratropium nasal spray for cough and congestion. Guaifenesin for cough.  Considered rapid step test but Centaur score was -1 making pretest probability 1%.    PATIENT EDUCATION PROVIDED: See AVS    Diagnosis and plan along with any newly prescribed medication(s) were discussed in detail with this patient today. The patient verbalized understanding and agreed with the plan. Patient advised if symptoms worsen return to clinic or ER.   Health Maintainance:   No orders of the defined types were placed in this encounter.   Meds ordered this encounter  Medications  . amoxicillin-clavulanate (AUGMENTIN XR) 1000-62.5 MG 12 hr tablet    Sig: Take 2 tablets by mouth 2 (two) times daily for 7 days.    Dispense:  28 tablet    Refill:  0  . Fluticasone-Umeclidin-Vilant (TRELEGY ELLIPTA) 100-62.5-25 MCG/INH AEPB    Sig: Inhale 1 puff into the lungs daily.    Dispense:  28 each    Refill:  0  . ipratropium (ATROVENT) 0.03 % nasal spray    Sig: Place 2 sprays into both nostrils every 12 (twelve) hours.    Dispense:  30 mL    Refill:  12  . guaiFENesin (ROBITUSSIN) 100 MG/5ML SOLN    Sig: Take 5 mLs (100 mg  total) by mouth every 4 (four) hours as needed for cough or to loosen phlegm.    Dispense:  1200 mL    Refill:  0     Harolyn Rutherford, DO 05/21/2018, 1:46 PM PGY-2 [place]

## 2018-05-21 NOTE — Patient Instructions (Signed)
It was great to see you today! Thank you for letting me participate in your care!  Today, we discussed your continued cough, congestion, and feeling poorly. I believe you have a sinus infection and I have given you an antibiotic to take for 7 days. Please take as directed. If you do NOT get better in 5 days please RETURN to the clinic.  For your cough and congestion I have given you a nasal spray. Please spray it into each nostril twice, two times per day.  I have also refilled your Trelegy for your COPD. Please take one puff daily.  Be well, Harolyn Rutherford, DO PGY-2, [facility]

## 2018-05-21 NOTE — Progress Notes (Signed)
Alisa, RN received prior authorization for higher dosage of Augmentin. 875 mg/125 mg prescribed for 7 days.

## 2018-05-21 NOTE — Assessment & Plan Note (Addendum)
Considered PNA vs viral vs acute bacterial sinusitis vs step throat. Patient has risk factors for PNA and her CURB-65 score was moderate. Reassured it is not PNA due to stable vital signs and physical exam. Most likely bacterial sinusitis given patient has had ongoing symptoms for 2 weeks with no improvement with OTC medications.  Augmentin 2g/125mg  twice a day for 7 days given she is >65. Strict return precautions discussed with patient. Ipratropium nasal spray for cough and congestion. Guaifenesin for cough.  Considered rapid step test but Centaur score was -1 making pretest probability 1%.

## 2018-05-25 ENCOUNTER — Other Ambulatory Visit: Payer: Self-pay | Admitting: Family Medicine

## 2018-05-27 NOTE — Telephone Encounter (Signed)
error 

## 2018-06-11 ENCOUNTER — Other Ambulatory Visit: Payer: Self-pay

## 2018-06-11 ENCOUNTER — Encounter: Payer: Self-pay | Admitting: Vascular Surgery

## 2018-06-11 ENCOUNTER — Ambulatory Visit (INDEPENDENT_AMBULATORY_CARE_PROVIDER_SITE_OTHER)
Admission: RE | Admit: 2018-06-11 | Discharge: 2018-06-11 | Disposition: A | Discharge status: Home / Self Care | Payer: Medicare HMO | Source: Ambulatory Visit | Attending: Vascular Surgery | Admitting: Vascular Surgery

## 2018-06-11 ENCOUNTER — Ambulatory Visit (HOSPITAL_COMMUNITY)
Admission: RE | Admit: 2018-06-11 | Discharge: 2018-06-11 | Disposition: A | Discharge status: Home / Self Care | Payer: Medicare HMO | Source: Ambulatory Visit | Attending: Vascular Surgery | Admitting: Vascular Surgery

## 2018-06-11 ENCOUNTER — Ambulatory Visit (INDEPENDENT_AMBULATORY_CARE_PROVIDER_SITE_OTHER): Payer: Self-pay | Admitting: Vascular Surgery

## 2018-06-11 VITALS — BP 173/97 | HR 78 | Temp 97.3°F | Resp 16 | Ht 66.5 in | Wt 158.0 lb

## 2018-06-11 DIAGNOSIS — I70209 Unspecified atherosclerosis of native arteries of extremities, unspecified extremity: Secondary | ICD-10-CM | POA: Insufficient documentation

## 2018-06-11 DIAGNOSIS — I739 Peripheral vascular disease, unspecified: Secondary | ICD-10-CM | POA: Insufficient documentation

## 2018-06-11 NOTE — Progress Notes (Signed)
    Subjective:     Patient ID: [name], female   DOB: September 06, 1950, 67 y.o.   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  At this time she has no lower extremity symptoms and is without wound or ulceration.  She has noninvasive studies performed prior to today's visit.  We discussed possible need bypass femoral to above-knee popliteal on the right and she did not have vein for use there but at this time she has no symptoms.  She continues to smoke but states that she is cutting back.   Review of Systems No leg pain    Objective:   Physical Exam Awake alert oriented Well-healed left lower extremity incisions.  She has palpable dorsalis pedis and posterior tibial pulses on the left Palpable right common femoral artery pulse She has no tissue loss or ulceration on either foot  ABIs right 0.48 and left 0.98    Assessment:     67 year old female with left femoral to below-knee popliteal artery bypass with graft.  ABIs now 0.98 and she has palpable pulses on the left.  Right side she is asymptomatic with ABI of 0.48 that is stable.    Plan:     She will follow-up with repeat studies in a few months.  [name] C. Donzetta Matters, MD [facility] Office: [phone] Pager: [phone]

## 2018-06-20 ENCOUNTER — Other Ambulatory Visit: Payer: Self-pay | Admitting: Family Medicine

## 2018-07-05 ENCOUNTER — Other Ambulatory Visit: Payer: Self-pay | Admitting: Family Medicine

## 2018-08-16 ENCOUNTER — Other Ambulatory Visit: Payer: Self-pay | Admitting: Family Medicine

## 2018-10-03 ENCOUNTER — Other Ambulatory Visit: Payer: Self-pay | Admitting: Family Medicine

## 2018-11-22 ENCOUNTER — Other Ambulatory Visit: Payer: Self-pay | Admitting: Family Medicine

## 2018-12-16 ENCOUNTER — Other Ambulatory Visit: Payer: Self-pay | Admitting: Family Medicine

## 2018-12-17 ENCOUNTER — Ambulatory Visit: Payer: Medicare HMO | Admitting: Vascular Surgery

## 2018-12-17 ENCOUNTER — Encounter (HOSPITAL_COMMUNITY): Payer: Medicare HMO

## 2018-12-17 ENCOUNTER — Other Ambulatory Visit (HOSPITAL_COMMUNITY): Payer: Medicare HMO

## 2018-12-29 ENCOUNTER — Other Ambulatory Visit: Payer: Self-pay

## 2018-12-29 DIAGNOSIS — I739 Peripheral vascular disease, unspecified: Secondary | ICD-10-CM

## 2019-01-21 ENCOUNTER — Ambulatory Visit: Payer: Medicare HMO | Admitting: Vascular Surgery

## 2019-01-21 ENCOUNTER — Encounter (HOSPITAL_COMMUNITY): Payer: Medicare HMO

## 2019-01-21 ENCOUNTER — Other Ambulatory Visit (HOSPITAL_COMMUNITY): Payer: Medicare HMO

## 2019-03-24 ENCOUNTER — Emergency Department (HOSPITAL_COMMUNITY): Payer: Medicare HMO

## 2019-03-24 ENCOUNTER — Other Ambulatory Visit: Payer: Self-pay

## 2019-03-24 ENCOUNTER — Emergency Department (HOSPITAL_COMMUNITY)
Admission: EM | Admit: 2019-03-24 | Discharge: 2019-03-24 | Disposition: A | Discharge status: Home / Self Care | Payer: Medicare HMO | Attending: Emergency Medicine | Admitting: Emergency Medicine

## 2019-03-24 ENCOUNTER — Ambulatory Visit (HOSPITAL_COMMUNITY)
Admission: EM | Admit: 2019-03-24 | Discharge: 2019-03-24 | Disposition: A | Discharge status: Home / Self Care | Payer: Medicare HMO | Source: Home / Self Care

## 2019-03-24 ENCOUNTER — Encounter (HOSPITAL_COMMUNITY): Payer: Self-pay | Admitting: Emergency Medicine

## 2019-03-24 DIAGNOSIS — Z79899 Other long term (current) drug therapy: Secondary | ICD-10-CM | POA: Diagnosis not present

## 2019-03-24 DIAGNOSIS — R112 Nausea with vomiting, unspecified: Secondary | ICD-10-CM | POA: Diagnosis not present

## 2019-03-24 DIAGNOSIS — R102 Pelvic and perineal pain: Secondary | ICD-10-CM | POA: Diagnosis not present

## 2019-03-24 DIAGNOSIS — M542 Cervicalgia: Secondary | ICD-10-CM | POA: Insufficient documentation

## 2019-03-24 DIAGNOSIS — Z8673 Personal history of transient ischemic attack (TIA), and cerebral infarction without residual deficits: Secondary | ICD-10-CM

## 2019-03-24 DIAGNOSIS — S199XXA Unspecified injury of neck, initial encounter: Secondary | ICD-10-CM | POA: Diagnosis not present

## 2019-03-24 DIAGNOSIS — S0990XA Unspecified injury of head, initial encounter: Secondary | ICD-10-CM | POA: Diagnosis not present

## 2019-03-24 DIAGNOSIS — M25552 Pain in left hip: Secondary | ICD-10-CM | POA: Insufficient documentation

## 2019-03-24 DIAGNOSIS — R0602 Shortness of breath: Secondary | ICD-10-CM | POA: Diagnosis not present

## 2019-03-24 DIAGNOSIS — J449 Chronic obstructive pulmonary disease, unspecified: Secondary | ICD-10-CM | POA: Diagnosis not present

## 2019-03-24 DIAGNOSIS — M549 Dorsalgia, unspecified: Secondary | ICD-10-CM | POA: Diagnosis not present

## 2019-03-24 DIAGNOSIS — S299XXA Unspecified injury of thorax, initial encounter: Secondary | ICD-10-CM | POA: Diagnosis not present

## 2019-03-24 DIAGNOSIS — F17210 Nicotine dependence, cigarettes, uncomplicated: Secondary | ICD-10-CM | POA: Diagnosis not present

## 2019-03-24 DIAGNOSIS — R1030 Lower abdominal pain, unspecified: Secondary | ICD-10-CM | POA: Insufficient documentation

## 2019-03-24 DIAGNOSIS — R51 Headache: Secondary | ICD-10-CM | POA: Diagnosis not present

## 2019-03-24 DIAGNOSIS — R0789 Other chest pain: Secondary | ICD-10-CM | POA: Insufficient documentation

## 2019-03-24 DIAGNOSIS — F1210 Cannabis abuse, uncomplicated: Secondary | ICD-10-CM | POA: Insufficient documentation

## 2019-03-24 DIAGNOSIS — M7918 Myalgia, other site: Secondary | ICD-10-CM

## 2019-03-24 DIAGNOSIS — S3991XA Unspecified injury of abdomen, initial encounter: Secondary | ICD-10-CM | POA: Diagnosis not present

## 2019-03-24 DIAGNOSIS — R109 Unspecified abdominal pain: Secondary | ICD-10-CM | POA: Diagnosis not present

## 2019-03-24 DIAGNOSIS — S79912A Unspecified injury of left hip, initial encounter: Secondary | ICD-10-CM | POA: Diagnosis not present

## 2019-03-24 LAB — CBC
HCT: 36.5 % (ref 36.0–46.0)
Hemoglobin: 11.5 g/dL — ABNORMAL LOW (ref 12.0–15.0)
MCH: 29.8 pg (ref 26.0–34.0)
MCHC: 31.5 g/dL (ref 30.0–36.0)
MCV: 94.6 fL (ref 80.0–100.0)
Platelets: 245 10*3/uL (ref 150–400)
RBC: 3.86 MIL/uL — ABNORMAL LOW (ref 3.87–5.11)
RDW: 13.5 % (ref 11.5–15.5)
WBC: 5.7 10*3/uL (ref 4.0–10.5)
nRBC: 0 % (ref 0.0–0.2)

## 2019-03-24 LAB — URINALYSIS, ROUTINE W REFLEX MICROSCOPIC
Bilirubin Urine: NEGATIVE
Glucose, UA: NEGATIVE mg/dL
Hgb urine dipstick: NEGATIVE
Ketones, ur: NEGATIVE mg/dL
Nitrite: NEGATIVE
Protein, ur: NEGATIVE mg/dL
Specific Gravity, Urine: 1.005 (ref 1.005–1.030)
pH: 5 (ref 5.0–8.0)

## 2019-03-24 LAB — COMPREHENSIVE METABOLIC PANEL
ALT: 10 U/L (ref 0–44)
AST: 17 U/L (ref 15–41)
Albumin: 4 g/dL (ref 3.5–5.0)
Alkaline Phosphatase: 81 U/L (ref 38–126)
Anion gap: 9 (ref 5–15)
BUN: 9 mg/dL (ref 8–23)
CO2: 23 mmol/L (ref 22–32)
Calcium: 9.2 mg/dL (ref 8.9–10.3)
Chloride: 106 mmol/L (ref 98–111)
Creatinine, Ser: 0.91 mg/dL (ref 0.44–1.00)
GFR calc Af Amer: >60 mL/min (ref >60)
GFR calc non Af Amer: >60 mL/min (ref >60)
Glucose, Bld: 112 mg/dL — ABNORMAL HIGH (ref 70–99)
Potassium: 3.8 mmol/L (ref 3.5–5.1)
Sodium: 138 mmol/L (ref 135–145)
Total Bilirubin: 0.3 mg/dL (ref 0.3–1.2)
Total Protein: 6.5 g/dL (ref 6.5–8.1)

## 2019-03-24 LAB — LIPASE, BLOOD: Lipase: 33 U/L (ref 11–51)

## 2019-03-24 MED ORDER — SODIUM CHLORIDE 0.9% FLUSH
3.0000 mL | Freq: Once | INTRAVENOUS | Status: AC
  Administered 2019-03-24: 3 mL via INTRAVENOUS

## 2019-03-24 MED ORDER — ALBUTEROL SULFATE HFA 108 (90 BASE) MCG/ACT IN AERS
2.0000 | INHALATION_SPRAY | Freq: Once | RESPIRATORY_TRACT | Status: AC
  Administered 2019-03-24: 2 via RESPIRATORY_TRACT
  Filled 2019-03-24: qty 6.7

## 2019-03-24 MED ORDER — ACETAMINOPHEN 325 MG PO TABS
650.0000 mg | ORAL_TABLET | Freq: Once | ORAL | Status: AC
  Administered 2019-03-24: 650 mg via ORAL
  Filled 2019-03-24: qty 2

## 2019-03-24 MED ORDER — SODIUM CHLORIDE 0.9 % IV BOLUS
500.0000 mL | Freq: Once | INTRAVENOUS | Status: AC
  Administered 2019-03-24: 500 mL via INTRAVENOUS

## 2019-03-24 MED ORDER — IOHEXOL 300 MG/ML  SOLN
100.0000 mL | Freq: Once | INTRAMUSCULAR | Status: AC | PRN
  Administered 2019-03-24: 18:00:00 100 mL via INTRAVENOUS
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
## 2019-03-24 NOTE — ED Notes (Signed)
Patient transported to X-ray 

## 2019-03-24 NOTE — ED Notes (Signed)
Patient transported to CT 

## 2019-03-24 NOTE — ED Notes (Signed)
Patient verbalizes understanding of discharge instructions. Opportunity for questioning and answers were provided. Armband removed by staff, pt discharged from ED.  
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
## 2019-03-24 NOTE — ED Notes (Signed)
Patient is being discharged from the [facility] and sent to the Emergency Department via wheelchair by staff. Per Dr Vanita Panda, patient is stable but in need of higher level of care due to xray machine being sown. Patient is aware and verbalizes understanding of plan of care. There were no vitals filed for this visit.

## 2019-03-24 NOTE — Discharge Instructions (Addendum)
Your imaging studies today did not show any evidence of acute injury.  Your CT scan of your head did show that you have had a stroke in the past.  You should follow-up with your regular doctor in regards to this to see how you can reduce your risk factors for having another stroke.  A culture was sent of your urine today to determine if there is any bacterial growth. If the results of the culture are positive and you require an antibiotic or a change of your prescribed antibiotic you will be contacted by the hospital. If the results are negative you will not be contacted.  You may take Tylenol and/or Aleve for your pain.  Please make sure to take these medications with meals. Please follow up with your primary care provider within 5-7 days for re-evaluation of your symptoms. If you do not have a primary care provider, information for a healthcare clinic has been provided for you to make arrangements for follow up care. Please return to the emergency department for any new or worsening symptoms.

## 2019-03-25 LAB — URINE CULTURE: Culture: <10000 — AB

## 2019-04-19 ENCOUNTER — Other Ambulatory Visit: Payer: Self-pay | Admitting: Family Medicine

## 2019-04-21 ENCOUNTER — Other Ambulatory Visit: Payer: Self-pay

## 2019-04-21 MED ORDER — PROMETHAZINE HCL 25 MG PO TABS: ORAL_TABLET | ORAL | 0 refills | Status: DC

## 2019-07-04 ENCOUNTER — Other Ambulatory Visit: Payer: Self-pay | Admitting: *Deleted

## 2019-07-04 ENCOUNTER — Telehealth: Payer: Self-pay | Admitting: Family Medicine

## 2019-07-04 NOTE — Telephone Encounter (Signed)
Can we call and see if this patient is interested in coming in for an annual physical? She has not been seen since 2019 with several past due health maintenance.    Thank you!   [name], DO

## 2019-07-05 NOTE — Telephone Encounter (Signed)
LMOVM for pt to call back and schedule and appt with dr Higinio Plan. Rubi Tooley Kennon Holter, CMA

## 2019-07-14 ENCOUNTER — Other Ambulatory Visit: Payer: Self-pay | Admitting: *Deleted

## 2019-07-14 MED ORDER — PROMETHAZINE HCL 25 MG PO TABS: ORAL_TABLET | ORAL | 0 refills | Status: DC

## 2019-07-22 ENCOUNTER — Other Ambulatory Visit: Payer: Self-pay | Admitting: Family Medicine

## 2019-07-22 MED ORDER — VENTOLIN HFA 108 (90 BASE) MCG/ACT IN AERS: INHALATION_SPRAY | RESPIRATORY_TRACT | 0 refills | Status: DC

## 2019-07-22 NOTE — Telephone Encounter (Signed)
Pt is calling and needs a refill on her inhaler. jw

## 2019-07-25 ENCOUNTER — Ambulatory Visit: Payer: Medicare HMO

## 2019-07-25 ENCOUNTER — Other Ambulatory Visit: Payer: Self-pay

## 2019-08-09 ENCOUNTER — Other Ambulatory Visit: Payer: Self-pay | Admitting: Family Medicine

## 2019-08-09 NOTE — Telephone Encounter (Signed)
This inhaler was refilled two weeks ago, does she need this? If she is using her albuterol inhaler that frequently, she would certainly benefit from scheduling a visit.   [name], DO

## 2019-08-12 NOTE — Telephone Encounter (Signed)
Attempted to call pt, left message with daughter for patient to call back regarding inhaler refill. Devion Chriscoe Kennon Holter, CMA

## 2019-11-03 ENCOUNTER — Encounter (HOSPITAL_COMMUNITY): Payer: Self-pay | Admitting: Emergency Medicine

## 2019-11-03 ENCOUNTER — Emergency Department (HOSPITAL_COMMUNITY): Payer: Medicare HMO

## 2019-11-03 ENCOUNTER — Inpatient Hospital Stay (HOSPITAL_COMMUNITY)
Admission: EM | Admit: 2019-11-03 | Discharge: 2019-11-05 | DRG: 065 | Disposition: A | Discharge status: Home / Self Care | Payer: Medicare HMO | Attending: Family Medicine | Admitting: Family Medicine

## 2019-11-03 ENCOUNTER — Ambulatory Visit (INDEPENDENT_AMBULATORY_CARE_PROVIDER_SITE_OTHER): Payer: Medicare HMO

## 2019-11-03 ENCOUNTER — Other Ambulatory Visit: Payer: Self-pay

## 2019-11-03 ENCOUNTER — Ambulatory Visit (INDEPENDENT_AMBULATORY_CARE_PROVIDER_SITE_OTHER)
Admission: EM | Admit: 2019-11-03 | Discharge: 2019-11-03 | Disposition: A | Discharge status: Other Acute Inpatient Hospital | Payer: Medicare HMO | Source: Home / Self Care

## 2019-11-03 DIAGNOSIS — F17210 Nicotine dependence, cigarettes, uncomplicated: Secondary | ICD-10-CM | POA: Diagnosis not present

## 2019-11-03 DIAGNOSIS — Z95820 Peripheral vascular angioplasty status with implants and grafts: Secondary | ICD-10-CM | POA: Diagnosis not present

## 2019-11-03 DIAGNOSIS — Z86010 Personal history of colonic polyps: Secondary | ICD-10-CM

## 2019-11-03 DIAGNOSIS — I10 Essential (primary) hypertension: Secondary | ICD-10-CM | POA: Diagnosis not present

## 2019-11-03 DIAGNOSIS — I361 Nonrheumatic tricuspid (valve) insufficiency: Secondary | ICD-10-CM | POA: Diagnosis not present

## 2019-11-03 DIAGNOSIS — J441 Chronic obstructive pulmonary disease with (acute) exacerbation: Secondary | ICD-10-CM | POA: Diagnosis present

## 2019-11-03 DIAGNOSIS — Z7951 Long term (current) use of inhaled steroids: Secondary | ICD-10-CM | POA: Diagnosis not present

## 2019-11-03 DIAGNOSIS — R05 Cough: Secondary | ICD-10-CM | POA: Diagnosis not present

## 2019-11-03 DIAGNOSIS — Z79899 Other long term (current) drug therapy: Secondary | ICD-10-CM | POA: Diagnosis not present

## 2019-11-03 DIAGNOSIS — F1210 Cannabis abuse, uncomplicated: Secondary | ICD-10-CM | POA: Diagnosis not present

## 2019-11-03 DIAGNOSIS — R9431 Abnormal electrocardiogram [ECG] [EKG]: Secondary | ICD-10-CM | POA: Diagnosis present

## 2019-11-03 DIAGNOSIS — I739 Peripheral vascular disease, unspecified: Secondary | ICD-10-CM | POA: Diagnosis present

## 2019-11-03 DIAGNOSIS — Z7952 Long term (current) use of systemic steroids: Secondary | ICD-10-CM | POA: Diagnosis not present

## 2019-11-03 DIAGNOSIS — E785 Hyperlipidemia, unspecified: Secondary | ICD-10-CM | POA: Diagnosis present

## 2019-11-03 DIAGNOSIS — R260 Ataxic gait: Secondary | ICD-10-CM | POA: Diagnosis not present

## 2019-11-03 DIAGNOSIS — J449 Chronic obstructive pulmonary disease, unspecified: Secondary | ICD-10-CM

## 2019-11-03 DIAGNOSIS — K219 Gastro-esophageal reflux disease without esophagitis: Secondary | ICD-10-CM | POA: Diagnosis present

## 2019-11-03 DIAGNOSIS — I6523 Occlusion and stenosis of bilateral carotid arteries: Secondary | ICD-10-CM | POA: Diagnosis not present

## 2019-11-03 DIAGNOSIS — Z20822 Contact with and (suspected) exposure to covid-19: Secondary | ICD-10-CM | POA: Diagnosis present

## 2019-11-03 DIAGNOSIS — Z791 Long term (current) use of non-steroidal anti-inflammatories (NSAID): Secondary | ICD-10-CM

## 2019-11-03 DIAGNOSIS — J3489 Other specified disorders of nose and nasal sinuses: Secondary | ICD-10-CM

## 2019-11-03 DIAGNOSIS — H269 Unspecified cataract: Secondary | ICD-10-CM | POA: Diagnosis present

## 2019-11-03 DIAGNOSIS — I6503 Occlusion and stenosis of bilateral vertebral arteries: Secondary | ICD-10-CM | POA: Diagnosis not present

## 2019-11-03 DIAGNOSIS — R030 Elevated blood-pressure reading, without diagnosis of hypertension: Secondary | ICD-10-CM

## 2019-11-03 DIAGNOSIS — I700 Atherosclerosis of aorta: Secondary | ICD-10-CM | POA: Diagnosis present

## 2019-11-03 DIAGNOSIS — J0100 Acute maxillary sinusitis, unspecified: Secondary | ICD-10-CM | POA: Diagnosis not present

## 2019-11-03 DIAGNOSIS — R519 Headache, unspecified: Secondary | ICD-10-CM

## 2019-11-03 DIAGNOSIS — J341 Cyst and mucocele of nose and nasal sinus: Secondary | ICD-10-CM | POA: Diagnosis present

## 2019-11-03 DIAGNOSIS — Z7902 Long term (current) use of antithrombotics/antiplatelets: Secondary | ICD-10-CM

## 2019-11-03 DIAGNOSIS — R059 Cough, unspecified: Secondary | ICD-10-CM

## 2019-11-03 DIAGNOSIS — I63441 Cerebral infarction due to embolism of right cerebellar artery: Secondary | ICD-10-CM | POA: Diagnosis not present

## 2019-11-03 DIAGNOSIS — G8929 Other chronic pain: Secondary | ICD-10-CM | POA: Diagnosis present

## 2019-11-03 DIAGNOSIS — I63449 Cerebral infarction due to embolism of unspecified cerebellar artery: Secondary | ICD-10-CM | POA: Diagnosis not present

## 2019-11-03 DIAGNOSIS — R0602 Shortness of breath: Secondary | ICD-10-CM | POA: Diagnosis not present

## 2019-11-03 DIAGNOSIS — Z8673 Personal history of transient ischemic attack (TIA), and cerebral infarction without residual deficits: Secondary | ICD-10-CM | POA: Diagnosis not present

## 2019-11-03 DIAGNOSIS — J320 Chronic maxillary sinusitis: Secondary | ICD-10-CM | POA: Diagnosis present

## 2019-11-03 DIAGNOSIS — R531 Weakness: Secondary | ICD-10-CM

## 2019-11-03 DIAGNOSIS — M549 Dorsalgia, unspecified: Secondary | ICD-10-CM | POA: Diagnosis present

## 2019-11-03 DIAGNOSIS — Z7982 Long term (current) use of aspirin: Secondary | ICD-10-CM | POA: Diagnosis not present

## 2019-11-03 DIAGNOSIS — R202 Paresthesia of skin: Secondary | ICD-10-CM | POA: Diagnosis not present

## 2019-11-03 DIAGNOSIS — Z90711 Acquired absence of uterus with remaining cervical stump: Secondary | ICD-10-CM | POA: Diagnosis not present

## 2019-11-03 DIAGNOSIS — R29700 NIHSS score 0: Secondary | ICD-10-CM | POA: Diagnosis present

## 2019-11-03 DIAGNOSIS — I6501 Occlusion and stenosis of right vertebral artery: Secondary | ICD-10-CM | POA: Diagnosis present

## 2019-11-03 DIAGNOSIS — R29818 Other symptoms and signs involving the nervous system: Secondary | ICD-10-CM | POA: Diagnosis not present

## 2019-11-03 DIAGNOSIS — I639 Cerebral infarction, unspecified: Secondary | ICD-10-CM | POA: Diagnosis not present

## 2019-11-03 DIAGNOSIS — Z809 Family history of malignant neoplasm, unspecified: Secondary | ICD-10-CM | POA: Diagnosis not present

## 2019-11-03 DIAGNOSIS — Z8249 Family history of ischemic heart disease and other diseases of the circulatory system: Secondary | ICD-10-CM | POA: Diagnosis not present

## 2019-11-03 DIAGNOSIS — H9319 Tinnitus, unspecified ear: Secondary | ICD-10-CM | POA: Diagnosis present

## 2019-11-03 DIAGNOSIS — M542 Cervicalgia: Secondary | ICD-10-CM | POA: Diagnosis not present

## 2019-11-03 HISTORY — DX: Cerebral infarction, unspecified: I63.9

## 2019-11-03 LAB — DIFFERENTIAL
Abs Immature Granulocytes: 0.02 10*3/uL (ref 0.00–0.07)
Basophils Absolute: 0 10*3/uL (ref 0.0–0.1)
Basophils Relative: 0 %
Eosinophils Absolute: 0 10*3/uL (ref 0.0–0.5)
Eosinophils Relative: 0 %
Immature Granulocytes: 0 %
Lymphocytes Relative: 32 %
Lymphs Abs: 2.5 10*3/uL (ref 0.7–4.0)
Monocytes Absolute: 0.4 10*3/uL (ref 0.1–1.0)
Monocytes Relative: 6 %
Neutro Abs: 5 10*3/uL (ref 1.7–7.7)
Neutrophils Relative %: 62 %

## 2019-11-03 LAB — COMPREHENSIVE METABOLIC PANEL
ALT: 8 U/L (ref 0–44)
AST: 16 U/L (ref 15–41)
Albumin: 4.1 g/dL (ref 3.5–5.0)
Alkaline Phosphatase: 84 U/L (ref 38–126)
Anion gap: 11 (ref 5–15)
BUN: 5 mg/dL — ABNORMAL LOW (ref 8–23)
CO2: 24 mmol/L (ref 22–32)
Calcium: 9.1 mg/dL (ref 8.9–10.3)
Chloride: 105 mmol/L (ref 98–111)
Creatinine, Ser: 0.95 mg/dL (ref 0.44–1.00)
GFR calc Af Amer: >60 mL/min (ref >60)
GFR calc non Af Amer: >60 mL/min (ref >60)
Glucose, Bld: 116 mg/dL — ABNORMAL HIGH (ref 70–99)
Potassium: 3.6 mmol/L (ref 3.5–5.1)
Sodium: 140 mmol/L (ref 135–145)
Total Bilirubin: 0.5 mg/dL (ref 0.3–1.2)
Total Protein: 7.2 g/dL (ref 6.5–8.1)

## 2019-11-03 LAB — PROTIME-INR
INR: 1 (ref 0.8–1.2)
Prothrombin Time: 12.8 seconds (ref 11.4–15.2)

## 2019-11-03 LAB — I-STAT CHEM 8, ED
BUN: 7 mg/dL — ABNORMAL LOW (ref 8–23)
Calcium, Ion: 1.14 mmol/L — ABNORMAL LOW (ref 1.15–1.40)
Chloride: 105 mmol/L (ref 98–111)
Creatinine, Ser: 0.8 mg/dL (ref 0.44–1.00)
Glucose, Bld: 111 mg/dL — ABNORMAL HIGH (ref 70–99)
HCT: 40 % (ref 36.0–46.0)
Hemoglobin: 13.6 g/dL (ref 12.0–15.0)
Potassium: 3.6 mmol/L (ref 3.5–5.1)
Sodium: 141 mmol/L (ref 135–145)
TCO2: 25 mmol/L (ref 22–32)

## 2019-11-03 LAB — CBC
HCT: 39.4 % (ref 36.0–46.0)
Hemoglobin: 12.4 g/dL (ref 12.0–15.0)
MCH: 30.2 pg (ref 26.0–34.0)
MCHC: 31.5 g/dL (ref 30.0–36.0)
MCV: 95.9 fL (ref 80.0–100.0)
Platelets: 276 10*3/uL (ref 150–400)
RBC: 4.11 MIL/uL (ref 3.87–5.11)
RDW: 14.3 % (ref 11.5–15.5)
WBC: 8.1 10*3/uL (ref 4.0–10.5)
nRBC: 0 % (ref 0.0–0.2)

## 2019-11-03 LAB — CBG MONITORING, ED: Glucose-Capillary: 92 mg/dL (ref 70–99)

## 2019-11-03 LAB — APTT: aPTT: 31 seconds (ref 24–36)

## 2019-11-03 MED ORDER — SODIUM CHLORIDE 0.9% FLUSH
3.0000 mL | Freq: Once | INTRAVENOUS | Status: AC
Start: 2019-11-03 — End: 2019-11-03
  Administered 2019-11-03: 3 mL via INTRAVENOUS

## 2019-11-03 MED ORDER — METHYLPREDNISOLONE ACETATE 80 MG/ML IJ SUSP: 80.0000 mg | Freq: Once | INTRAMUSCULAR | Status: DC

## 2019-11-03 MED ORDER — ONDANSETRON HCL 4 MG/2ML IJ SOLN
4.0000 mg | Freq: Once | INTRAMUSCULAR | Status: AC
  Administered 2019-11-03: 4 mg via INTRAVENOUS
  Filled 2019-11-03: qty 2

## 2019-11-03 MED ORDER — FENTANYL CITRATE (PF) 100 MCG/2ML IJ SOLN
50.0000 ug | Freq: Once | INTRAMUSCULAR | Status: AC
  Administered 2019-11-03: 50 ug via INTRAVENOUS
  Filled 2019-11-03: qty 2

## 2019-11-03 MED ORDER — ALBUTEROL SULFATE HFA 108 (90 BASE) MCG/ACT IN AERS
2.0000 | INHALATION_SPRAY | Freq: Once | RESPIRATORY_TRACT | Status: AC
  Administered 2019-11-03: 15:00:00 2 via RESPIRATORY_TRACT

## 2019-11-03 MED ORDER — AMOXICILLIN-POT CLAVULANATE 875-125 MG PO TABS: 1.0000 | ORAL_TABLET | Freq: Two times a day (BID) | ORAL | 0 refills | Status: DC

## 2019-11-03 MED ORDER — ALBUTEROL SULFATE HFA 108 (90 BASE) MCG/ACT IN AERS
INHALATION_SPRAY | RESPIRATORY_TRACT | Status: AC
  Filled 2019-11-03: qty 6.7

## 2019-11-03 MED ORDER — IOHEXOL 350 MG/ML SOLN
100.0000 mL | Freq: Once | INTRAVENOUS | Status: AC | PRN
  Administered 2019-11-03: 100 mL via INTRAVENOUS

## 2019-11-03 MED ORDER — TRELEGY ELLIPTA 100-62.5-25 MCG/INH IN AEPB: 1.0000 | INHALATION_SPRAY | Freq: Every day | RESPIRATORY_TRACT | 0 refills | Status: DC

## 2019-11-03 MED ORDER — PREDNISONE 20 MG PO TABS: ORAL_TABLET | ORAL | 0 refills | Status: DC

## 2019-11-03 MED ORDER — AMLODIPINE BESYLATE 5 MG PO TABS: 5.0000 mg | ORAL_TABLET | Freq: Every day | ORAL | 0 refills | Status: DC

## 2019-11-03 MED ORDER — CLOPIDOGREL BISULFATE 300 MG PO TABS
300.0000 mg | ORAL_TABLET | Freq: Once | ORAL | Status: AC
  Administered 2019-11-04: 300 mg via ORAL
  Filled 2019-11-03: qty 1

## 2019-11-03 MED ORDER — CLOPIDOGREL BISULFATE 75 MG PO TABS
75.0000 mg | ORAL_TABLET | Freq: Every day | ORAL | Status: DC
  Administered 2019-11-04 – 2019-11-05 (×2): 75 mg via ORAL
  Filled 2019-11-03 (×2): qty 1

## 2019-11-03 MED ORDER — ASPIRIN EC 81 MG PO TBEC
81.0000 mg | DELAYED_RELEASE_TABLET | Freq: Every day | ORAL | Status: DC
  Administered 2019-11-04 – 2019-11-05 (×3): 81 mg via ORAL
  Filled 2019-11-03 (×3): qty 1

## 2019-11-03 NOTE — Consult Note (Signed)
Reason for Consult: Vertebral Occlusion Referring Physician: Dr. Stark Jock  CC: Neck Pain  History is obtained from:Patient  HPI: [name] is a 69 y.o. female who was in her normal state of health until Sunday or Monday at which point she had relatively acute onset of right neck pain, and had concurrent dizziness and difficulty walking that started at that time.  She also describes some nausea and vomiting.  The difficulty walking no nausea have greatly improved, but she continues to have significant right-sided neck pain.  She describes it as lateral extending up into her head.  For the symptoms, she presented to the emergency department.  While she was in the waiting room, head CT was ordered which was performed.  Prior to getting back into her room, however, she decided to leave.  When the CT read came back as possible thrombus in her vertebral artery, she was called and return to the emergency department for further evaluation.  There a CT angiogram was performed showing an occluded right vertebral.  LKW: Monday tpa given?: no, outside of window    ROS: A 14 point ROS was performed and is negative except as noted in the HPI.   Past Medical History:  Diagnosis Date  . Abdominal pain, epigastric 02/23/2013  . Abdominal pain, unspecified site 08/15/2013  . Abnormal neurological exam 04/11/2016  . Asthma   . Cataracts, bilateral 05/03/2014  . Chronic bronchitis ([place])   . Chronic lower back pain   . COPD (chronic obstructive pulmonary disease) ([place])   . Daily headache   . De Quervain's tenosynovitis, left 11/09/2014  . GERD (gastroesophageal reflux disease)   . Hx of colonic polyps   . Nausea & vomiting   . PSORIASIS 12/29/2006   Qualifier: Diagnosis of  By: Jobe Igo MD, Shanon Brow    . Right leg pain 04/02/2016  . Smoker      Family History  Problem Relation Age of Onset  . Cancer Other   . Coronary artery disease Other   . Cancer Mother   . Hypertension Mother   . Heart disease Mother    . Cancer Father   . Hypertension Father   . Cancer Sister   . Hypertension Sister      Social History:  reports that she has been smoking cigarettes. She has a 11.75 pack-year smoking history. She has never used smokeless tobacco. She reports previous alcohol use. She reports current drug use. Drug: Marijuana.   Exam: Current vital signs: BP (!) 164/94 (BP Location: Left Arm)   Pulse 90   Temp 98.4 F (36.9 C) (Oral)   Resp 18   SpO2 99%  Vital signs in last 24 hours: Temp:  [98.4 F (36.9 C)-98.7 F (37.1 C)] 98.4 F (36.9 C) (03/04 1549) Pulse Rate:  [89-96] 90 (03/04 2128) Resp:  [18-20] 18 (03/04 2128) BP: (151-179)/(92-104) 164/94 (03/04 2128) SpO2:  [96 %-100 %] 99 % (03/04 2128)   Physical Exam  Constitutional: Appears well-developed and well-nourished.  Psych: Affect appropriate to situation Eyes: No scleral injection HENT: No OP obstrucion MSK: no joint deformities.  Cardiovascular: Normal rate and regular rhythm.  Respiratory: Effort normal, non-labored breathing GI: Soft.  No distension. There is no tenderness.  Skin: WDI  Neuro: Mental Status: Patient is awake, alert, oriented to person, place, month, year, and situation. Patient is able to give a clear and coherent history. No signs of aphasia or neglect Cranial Nerves: II: Visual Fields are full. Pupils are equal, round, and reactive  to light.   III,IV, VI: She has full extraocular movements, but markedly impaired smooth pursuit. V: Facial sensation is symmetric to temperature VII: Facial movement is symmetric.  VIII: hearing is intact to voice X: Uvula elevates symmetrically XI: Shoulder shrug is symmetric. XII: tongue is midline without atrophy or fasciculations.  Motor: Tone is normal. Bulk is normal. 5/5 strength was present in all four extremities.  Sensory: Sensation is symmetric to light touch and temperature in the arms and legs. 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  At this point, I think she needs to be admitted for physical therapy and secondary risk factor modification.  Etiology I think is very possibly dissection given the acute headache, but not definite.  Further evaluation is needed.  Recommendations: - HgbA1c, fasting lipid panel - Frequent neuro checks - Echocardiogram - Prophylactic therapy-Antiplatelet med: Aspirin -81 mg daily and Plavix 85 mg daily following 300 mg load - Risk factor modification - Telemetry monitoring - PT consult, OT consult, Speech consult - Stroke team to follow  Roland Rack, MD [facility] [phone]  If 7pm- 7am, please page neurology on call as listed in [place].

## 2019-11-03 NOTE — ED Triage Notes (Signed)
Pt seen at [facility] just PTA to ED for evaluation of cough and nasal congestion with headache. Pt also having generalized weakness x 2 days and sts they advised she come here for a CT to rule out a stroke.

## 2019-11-03 NOTE — ED Provider Notes (Signed)
[facility] EMERGENCY DEPARTMENT Provider Note   CSN: [id number] Arrival date & time: 11/03/19  1544     History Chief Complaint  Patient presents with   Weakness    [name] is a 69 y.o. female who presents to the emergency department for evaluation of headache and concern for stroke.  Patient has a past medical history of smoking, peripheral arterial disease, COPD.  She was seen at the [facility] urgent care earlier today with complaint of headache, facial pain, wheezing and cough.  Patient states "I do not have any upper teeth but it feels like my teeth are hurting."  She was diagnosed with maxillary sinusitis.  Patient also complaining of right-sided facial pain, right throbbing headache and pain into the right side of her neck which has been present since Monday.  She states that it came on suddenly.  She had severe ataxia and dizziness past this past Monday.  Patient states that she is feeling much dizzy and is able to ambulate better but still feels a bit off balance.  She denies any other neurologic abnormalities.  HPI     Past Medical History:  Diagnosis Date   Abdominal pain, epigastric 02/23/2013   Abdominal pain, unspecified site 08/15/2013   Abnormal neurological exam 04/11/2016   Asthma    Cataracts, bilateral 05/03/2014   Chronic bronchitis (HCC)    Chronic lower back pain    COPD (chronic obstructive pulmonary disease) (HCC)    Daily headache    De Quervain's tenosynovitis, left 11/09/2014   GERD (gastroesophageal reflux disease)    Hx of colonic polyps    Nausea & vomiting    PSORIASIS 12/29/2006   Qualifier: Diagnosis of  By: Jobe Igo MD, David     Right leg pain 04/02/2016   Smoker     Patient Active Problem List   Diagnosis Date Noted   Cephalalgia    Maxillary sinusitis    Acute CVA (cerebrovascular accident) ([place]) 11/03/2019   Acute non-recurrent frontal sinusitis 05/21/2018   Prediabetes 10/27/2017   PAD (peripheral  artery disease) ([place]). 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   Occlusion of common femoral artery (HCC)    Loss of weight    Lumbar disc herniation    Leg pain, medial 04/11/2016   Orthopnea 09/28/2015   Hypertensive retinopathy of right eye, grade 1 05/03/2014   Hypertensive retinopathy of left eye, grade 2 05/03/2014   Dyspnea 08/10/2013   Protein-calorie malnutrition, severe ([place]) 123456   Cyclical vomiting 99991111   Nausea & vomiting    Tobacco abuse    COPD exacerbation ([place]) 12/29/2006   GASTROESOPHAGEAL REFLUX DISEASE 12/29/2006   HIATAL HERNIA WITH REFLUX 12/29/2006   PSORIASIS 12/29/2006    Past Surgical History:  Procedure Laterality Date   ABDOMINAL AORTOGRAM W/LOWER EXTREMITY N/A 10/06/2016   Procedure: Abdominal Aortogram w/Lower Extremity;  Surgeon: Waynetta Sandy, MD;  Location: [facility];  Service: Cardiovascular;  Laterality: N/A;   ABDOMINAL AORTOGRAM W/LOWER EXTREMITY N/A 02/10/2018   Procedure: ABDOMINAL AORTOGRAM W/LOWER EXTREMITY;  Surgeon: Waynetta Sandy, MD;  Location: [facility];  Service: Cardiovascular;  Laterality: N/A;   ABDOMINAL HERNIA REPAIR     APPENDECTOMY     COLONOSCOPY W/ POLYPECTOMY  02/26/2005   FEMORAL-POPLITEAL BYPASS GRAFT Left 04/20/2018   Procedure: Left FEMORAL-POPLITEAL ARTERY Bypass Graft using Gore Propaten Vascular Graft;  Surgeon: Waynetta Sandy, MD;  Location: [facility];  Service: Vascular;  Laterality: Left;   HERNIA REPAIR     LOWER EXTREMITY ANGIOGRAPHY Bilateral 04/19/2018   Procedure: LOWER EXTREMITY ANGIOGRAPHY;  Surgeon: Waynetta Sandy, MD;  Location: [facility];  Service: Cardiovascular;  Laterality: Bilateral;   PERIPHERAL VASCULAR ATHERECTOMY Right 10/06/2016   Procedure: Peripheral Vascular  Atherectomy;  Surgeon: Waynetta Sandy, MD;  Location: [facility];  Service: Cardiovascular;  Laterality: Right;   PERIPHERAL VASCULAR INTERVENTION Right 10/06/2016   Procedure: Peripheral Vascular Intervention;  Surgeon: Waynetta Sandy, MD;  Location: [facility];  Service: Cardiovascular;  Laterality: Right;  SFA   PERIPHERAL VASCULAR INTERVENTION Left 02/10/2018   Procedure: PERIPHERAL VASCULAR INTERVENTION;  Surgeon: Waynetta Sandy, MD;  Location: [facility];  Service: Cardiovascular;  Laterality: Left;   TONSILLECTOMY  1960s   UPPER GASTROINTESTINAL ENDOSCOPY  03/22/2010, 02/13/2005   VAGINAL HYSTERECTOMY     partial     OB History   No obstetric history on file.     Family History  Problem Relation Age of Onset   Cancer Other    Coronary artery disease Other    Cancer Mother    Hypertension Mother    Heart disease Mother    Cancer Father    Hypertension Father    Cancer Sister    Hypertension Sister     Social History   Tobacco Use   Smoking status: Current Every Day Smoker    Packs/day: 0.25    Years: 47.00    Pack years: 11.75    Types: Cigarettes   Smokeless tobacco: Never Used   Tobacco comment: 3-4 cigarettes per day  Substance Use Topics   Alcohol use: Not Currently    Alcohol/week: 0.0 standard drinks    Comment: Quit alcohol in 1992; "never really drank much alcohol"   Drug use: Yes    Types: Marijuana    Comment: 02/10/2018 "~ twice//wk"    Home Medications Prior to Admission medications   Medication Sig Start Date End Date Taking? Authorizing Provider  aspirin EC 81 MG tablet Take 81 mg by mouth daily.   Yes [provider]  atorvastatin (LIPITOR) 80 MG tablet TAKE 1 TABLET BY MOUTH ONCE DAILY AT 6 PM Patient taking differently: Take 80 mg by mouth daily at 6 PM.  11/16/17  Yes Smiley Houseman, MD  clopidogrel (PLAVIX) 75 MG tablet Take 1 tablet (75 mg total) by mouth  daily. 09/14/17  Yes Smiley Houseman, MD  VENTOLIN HFA 108 (90 Base) MCG/ACT inhaler INHALE 2 PUFFS BY MOUTH EVERY 4 HOURS AS NEEDED FOR WHEEZING OR SHORTNESS OF BREATH Patient taking differently: Inhale 2 puffs into the lungs every 4 (four) hours as needed for wheezing or shortness of breath.  07/22/19  Yes Beard, Samantha N, DO  amLODipine (NORVASC) 5 MG tablet Take 1 tablet (5 mg total) by mouth daily. 11/03/19   Jaynee Eagles, PA-C  amoxicillin-clavulanate (AUGMENTIN) 875-125 MG tablet Take 1 tablet by mouth 2 (two) times daily. 11/03/19   Jaynee Eagles, PA-C  Fluticasone-Umeclidin-Vilant (TRELEGY ELLIPTA) 100-62.5-25 MCG/INH AEPB Inhale 1 puff into the lungs daily. 11/03/19 12/03/19  Jaynee Eagles, PA-C  guaiFENesin (ROBITUSSIN) 100 MG/5ML SOLN Take 5 mLs (100 mg total) by mouth every 4 (four) hours as needed for cough or to loosen phlegm. Patient not taking: Reported on 11/03/2019 05/21/18   Nuala Alpha, DO  ipratropium (ATROVENT) 0.03 % nasal spray Place  2 sprays into both nostrils every 12 (twelve) hours. Patient not taking: Reported on 11/03/2019 05/21/18   Nuala Alpha, DO  meloxicam (MOBIC) 15 MG tablet TAKE 1 TABLET BY MOUTH EVERY DAY WITH FOOD AS NEEDED FOR FOOT PAIN Patient not taking: Reported on 11/03/2019 10/04/18   Lovenia Kim, MD  meloxicam (MOBIC) 7.5 MG tablet TAKE 1 TABLET BY MOUTH EVERY DAY FOR 4 DAYS THEN ONLY IF NEEDED FOR FOOT PAIN Patient not taking: Reported on 11/03/2019 08/16/18   Lovenia Kim, MD  predniSONE (DELTASONE) 20 MG tablet Take 2 tablets daily with breakfast. Patient taking differently: Take 40 mg by mouth daily with breakfast.  11/03/19   Jaynee Eagles, PA-C  promethazine (PHENERGAN) 25 MG tablet TAKE 1 TABLET BY MOUTH EVERY 6 HOURS AS NEEDED FOR NAUSEA OR VOMITING Patient not taking: Reported on 11/03/2019 07/14/19   [name], DO    Allergies    Patient has no known allergies.  Review of Systems   Review of Systems Ten systems reviewed and are negative for  acute change, except as noted in the HPI.   Physical Exam Updated Vital Signs BP 136/74    Pulse 93    Temp 98.4 F (36.9 C) (Oral)    Resp 16    SpO2 99%   Physical Exam Vitals and nursing note reviewed.  Constitutional:      General: She is not in acute distress.    Appearance: She is well-developed. She is not diaphoretic.  HENT:     Head: Normocephalic and atraumatic.  Eyes:     General: No scleral icterus.    Conjunctiva/sclera: Conjunctivae normal.  Cardiovascular:     Rate and Rhythm: Normal rate and regular rhythm.     Heart sounds: Normal heart sounds. No murmur. No friction rub. No gallop.   Pulmonary:     Effort: Pulmonary effort is normal. No respiratory distress.     Breath sounds: Normal breath sounds.  Abdominal:     General: Bowel sounds are normal. There is no distension.     Palpations: Abdomen is soft. There is no mass.     Tenderness: There is no abdominal tenderness. There is no guarding.  Musculoskeletal:     Cervical back: Normal range of motion.  Skin:    General: Skin is warm and dry.  Neurological:     Mental Status: She is alert and oriented to person, place, and time.  Psychiatric:        Behavior: Behavior normal.     ED Results / Procedures / Treatments   Labs (all labs ordered are listed, but only abnormal results are displayed) Labs Reviewed  COMPREHENSIVE METABOLIC PANEL - Abnormal; Notable for the following components:      Result Value   Glucose, Bld 116 (*)    BUN 5 (*)    All other components within normal limits  I-STAT CHEM 8, ED - Abnormal; Notable for the following components:   BUN 7 (*)    Glucose, Bld 111 (*)    Calcium, Ion 1.14 (*)    All other components within normal limits  SARS CORONAVIRUS 2 (TAT 6-24 HRS)  PROTIME-INR  APTT  CBC  DIFFERENTIAL  CBG MONITORING, ED    EKG None  Radiology CT Angio Head W or Wo Contrast  Result Date: 11/03/2019 CLINICAL DATA:  Carotid stenosis. Weakness. Abnormal hyperdense  appearance of the distal right vertebral artery on earlier noncontrast head CT. EXAM: CT ANGIOGRAPHY HEAD AND NECK TECHNIQUE: Multidetector  CT imaging of the head and neck was performed using the standard protocol during bolus administration of intravenous contrast. Multiplanar CT image reconstructions and MIPs were obtained to evaluate the vascular anatomy. Carotid stenosis measurements (when applicable) are obtained utilizing NASCET criteria, using the distal internal carotid diameter as the denominator. CONTRAST:  186mL OMNIPAQUE IOHEXOL 350 MG/ML SOLN COMPARISON:  None. FINDINGS: CTA NECK FINDINGS Aortic arch: Standard 3 vessel aortic arch with mild atherosclerotic plaque. Widely patent arch vessel origins. Right carotid system: Patent with moderate volume calcified plaque at the carotid bifurcation. No evidence of significant stenosis or dissection. Left carotid system: Patent without evidence of stenosis or dissection. Vertebral arteries: There is severe stenosis of the proximal right V1 segment, and there is occlusion of the V2 and V3 segments beginning at the C6 level without distal reconstitution. The left vertebral artery is patent with calcified plaque at its origin resulting in mild stenosis. Skeleton: No acute osseous abnormality or suspicious osseous lesion. Other neck: No evidence of cervical lymphadenopathy or mass. Upper chest: Mild centrilobular and paraseptal emphysema. Review of the MIP images confirms the above findings CTA HEAD FINDINGS Anterior circulation: The internal carotid arteries are patent from skull base to carotid termini with mild calcified plaque bilaterally not resulting in significant stenosis. ACAs and MCAs are patent without evidence of proximal branch occlusion or significant proximal stenosis. No aneurysm is identified. Posterior circulation: The intracranial right vertebral artery is occluded with minimal retrograde opacification distally. The intracranial left vertebral  artery is patent and supplies the basilar. There is a patent left PICA. A right PICA is not identified. The basilar artery is widely patent. Posterior communicating arteries are diminutive or absent. Both PCAs are patent without evidence of significant proximal stenosis. No aneurysm is identified. Venous sinuses: Patent. Anatomic variants: None. Review of the MIP images confirms the above findings IMPRESSION: 1. 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 2. Mild left vertebral artery origin stenosis. 3. No significant anterior intracranial arterial stenosis or cervical carotid artery stenosis. 4. Aortic Atherosclerosis (ICD10-I70.0) and Emphysema (ICD10-J43.9). Electronically Signed   By: Logan Bores M.D.   On: 11/03/2019 21:40   DG Chest 2 View  Result Date: 11/03/2019 CLINICAL DATA:  Cough and shortness of breath. EXAM: CHEST - 2 VIEW COMPARISON:  Chest radiograph 03/24/2019 FINDINGS: Chronic hyperinflation and bronchial thickening. No focal airspace disease. Unchanged heart size and mediastinal contours. Aortic atherosclerosis. No pulmonary edema. No pleural effusion or pneumothorax. No acute osseous abnormalities are seen. IMPRESSION: Chronic hyperinflation and bronchial thickening, imaging findings consistent with COPD. No acute abnormality. Aortic Atherosclerosis (ICD10-I70.0). Electronically Signed   By: Keith Rake M.D.   On: 11/03/2019 15:21   CT HEAD WO CONTRAST  Result Date: 11/03/2019 CLINICAL DATA:  Possible stroke; neuro deficits, subacute. Additional history provided: Nasal congestion with headache, generalized weakness for 2 days, throbbing sensation in head. EXAM: CT HEAD WITHOUT CONTRAST TECHNIQUE: Contiguous axial images were obtained from the base of the skull through the vertex without intravenous contrast. COMPARISON:  Head CT 03/24/2019, brain MRI 11/13/2011 FINDINGS: Brain: There is no evidence of  acute intracranial hemorrhage, intracranial mass, midline shift or extra-axial fluid collection.No demarcated cortical infarction. Patchy hypodensity within the cerebral white matter is similar as compared to prior head CT 03/24/2019. Findings are nonspecific, but consistent with chronic small vessel ischemic disease. Redemonstrated chronic infarcts in the bilateral cerebellar hemispheres. Cerebral volume is normal for age. Vascular: There  is asymmetric hyperdensity of the V4 right vertebral artery (series 3, image 4) (series 6, image 28). Atherosclerotic calcifications. Skull: Normal. Negative for fracture or focal lesion. Sinuses/Orbits: Visualized orbits demonstrate no acute abnormality. Small mucous retention cyst within the inferior right maxillary sinus. Bilateral sphenoid sinus mucosal thickening. Moderate-sized right sphenoid sinus mucous retention cyst. Findings of possible hyperdense intracranial right vertebral artery called by telephone at the time of interpretation on 11/03/2019 at 6:38 pm to provider Veryl Speak , who verbally acknowledged these results. IMPRESSION: Asymmetric hyperdensity of the intracranial right vertebral artery. Given the provided history, consider CTA head/neck to exclude thrombus within this vessel. Otherwise, no evidence of acute intracranial abnormality. Stable chronic small vessel ischemic disease. Redemonstrated chronic infarcts in the bilateral cerebellar hemispheres. Paranasal sinus disease as described with multiple mucous retention cysts. Electronically Signed   By: Kellie Simmering DO   On: 11/03/2019 18:39   CT Angio Neck W and/or Wo Contrast  Result Date: 11/03/2019 CLINICAL DATA:  Carotid stenosis. Weakness. Abnormal hyperdense appearance of the distal right vertebral artery on earlier noncontrast head CT. EXAM: CT ANGIOGRAPHY HEAD AND NECK TECHNIQUE: Multidetector CT imaging of the head and neck was performed using the standard protocol during bolus administration of  intravenous contrast. Multiplanar CT image reconstructions and MIPs were obtained to evaluate the vascular anatomy. Carotid stenosis measurements (when applicable) are obtained utilizing NASCET criteria, using the distal internal carotid diameter as the denominator. CONTRAST:  157mL OMNIPAQUE IOHEXOL 350 MG/ML SOLN COMPARISON:  None. FINDINGS: CTA NECK FINDINGS Aortic arch: Standard 3 vessel aortic arch with mild atherosclerotic plaque. Widely patent arch vessel origins. Right carotid system: Patent with moderate volume calcified plaque at the carotid bifurcation. No evidence of significant stenosis or dissection. Left carotid system: Patent without evidence of stenosis or dissection. Vertebral arteries: There is severe stenosis of the proximal right V1 segment, and there is occlusion of the V2 and V3 segments beginning at the C6 level without distal reconstitution. The left vertebral artery is patent with calcified plaque at its origin resulting in mild stenosis. Skeleton: No acute osseous abnormality or suspicious osseous lesion. Other neck: No evidence of cervical lymphadenopathy or mass. Upper chest: Mild centrilobular and paraseptal emphysema. Review of the MIP images confirms the above findings CTA HEAD FINDINGS Anterior circulation: The internal carotid arteries are patent from skull base to carotid termini with mild calcified plaque bilaterally not resulting in significant stenosis. ACAs and MCAs are patent without evidence of proximal branch occlusion or significant proximal stenosis. No aneurysm is identified. Posterior circulation: The intracranial right vertebral artery is occluded with minimal retrograde opacification distally. The intracranial left vertebral artery is patent and supplies the basilar. There is a patent left PICA. A right PICA is not identified. The basilar artery is widely patent. Posterior communicating arteries are diminutive or absent. Both PCAs are patent without evidence of  significant proximal stenosis. No aneurysm is identified. Venous sinuses: Patent. Anatomic variants: None. Review of the MIP images confirms the above findings IMPRESSION: 1. 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 2. Mild left vertebral artery origin stenosis. 3. No significant anterior intracranial arterial stenosis or cervical carotid artery stenosis. 4. Aortic Atherosclerosis (ICD10-I70.0) and Emphysema (ICD10-J43.9). Electronically Signed   By: Logan Bores M.D.   On: 11/03/2019 21:40    Procedures .Critical Care Performed by: Margarita Mail, PA-C Authorized by: Margarita Mail, PA-C   Critical care provider statement:  Critical care time (minutes):  45   Critical care was necessary to treat or prevent imminent or life-threatening deterioration of the following conditions:  CNS failure or compromise   Critical care was time spent personally by me on the following activities:  Discussions with consultants, evaluation of patient's response to treatment, examination of patient, ordering and performing treatments and interventions, ordering and review of laboratory studies, ordering and review of radiographic studies, pulse oximetry, re-evaluation of patient's condition, obtaining history from patient or surrogate and review of old charts   (including critical care time)  Medications Ordered in ED Medications  clopidogrel (PLAVIX) tablet 75 mg (has no administration in time range)  aspirin EC tablet 81 mg (81 mg Oral Given 11/04/19 0026)  sodium chloride flush (NS) 0.9 % injection 3 mL (3 mLs Intravenous Given 11/03/19 2029)  fentaNYL (SUBLIMAZE) injection 50 mcg (50 mcg Intravenous Given 11/03/19 2057)  ondansetron (ZOFRAN) injection 4 mg (4 mg Intravenous Given 11/03/19 2057)  iohexol (OMNIPAQUE) 350 MG/ML injection 100 mL (100 mLs Intravenous Contrast Given 11/03/19 2113)  clopidogrel (PLAVIX) tablet 300 mg (300 mg  Oral Given 11/04/19 [id number])    ED Course  I have reviewed the triage vital signs and the nursing notes.  Pertinent labs & imaging results that were available during my care of the patient were reviewed by me and considered in my medical decision making (see chart for details).    MDM Rules/Calculators/A&P                      Patient here with bad neck pain headache sent for neurologic evaluation.  Patient's labs show mildly elevated blood glucose, Chem-8 within normal limits.  CBC without abnormality.  CMP shows no acute abnormalities.  Covid test pending.  Normal PT/INR.  I personally reviewed the patient's CT head, CT angio head and neck which shows occlusion of the V2 to V4 segments of the right vertebral artery with potential acute thrombus.  I reviewed the images with Dr. Leonel Ramsay who will come to see the patient and recommends admission and antiplatelet therapy.  I personally reviewed the patient's EKG which shows normal sinus rhythm at a rate of 83 with nonspecific ST segment abnormalities, no arrhythmias or atrial fibrillation.  Patient stable throughout her ED visit and will be admitted. Final Clinical Impression(s) / ED Diagnoses Final diagnoses:  Acute CVA (cerebrovascular accident) [facility])    Rx / [place] Orders ED Discharge Orders    None       Margarita Mail, PA-C 11/04/19 [id number]    Veryl Speak, MD 11/04/19 1510

## 2019-11-03 NOTE — ED Notes (Signed)
This RN called pt home phone to inform her we have a room for her to see the EDP, pt states she will come back, she lives about 20 mins away.

## 2019-11-03 NOTE — H&P (Addendum)
[facility] Admission History and Physical Service Pager: [phone]  Patient name: [name] Medical record number: [id number] Date of birth: 1951/08/05 Age: 69 y.o. Gender: female  Primary Care Provider: [name], DO Consultants: Neurology  Code Status: Full Code  Preferred Emergency Contact: [name], Daughter, [phone]  Chief Complaint: headache   Assessment and Plan: [name] is a 69 y.o. female presenting with headache, found to have occlusions in the right vertebral artery segments V2 to V4 and acute CVA. PMH is significant for HTN, PAD (s/p bilateral stent placement in 2018 & 2019), COPD, GERD, HLD and Tobacco use.  Right Cerebellar CVA  Patient presented with HA for 4 days.  Patient states that the headache extends across her forehead and travels down the right side of her face into her lateral right side of her neck.  She rates the headache as 5/10 following administration of pain medication.  Patient reports that this headache has been present followed by bilateral lower extremity weakness that developed 3 days ago.  Head CT showed stable chronic small vessel ischemic disease, chronic infarcts in the bilateral cerebellar hemispheres and paranasal sinus disease with multiple mucous retention cysts. On head and neck CTA, patient is noted to have occlusion of the V2 to V4 segments of the right vertebral artery with an appearance suggesting potentially acute thrombus with mild left vertebral artery origin stenosis. Vitals notable for HTN with systolic blood pressures ranging from 136-165, other vital signs stable.  Neurological exam is within normal limits except for left lower extremity 4/5 strength and right lower extremity.  Patient is alert and oriented x4, patient does not demonstrate facial droop, has been able to ambulate in the ED, has cranial nerves bilaterally grossly intact and normal sensation throughout with normal range of  motion in all extremities with some notable limitation to range of motion in her neck secondary to neck pain that has improved since arrival in the ED. Labs include CMP and CBC that are both unremarkable. Home medications include aspirin 81 mg daily, atorvastatin 80 mg.  Given patient's new onset headache and lower extremity weakness, leading diagnosis of CVA is confirmed by neuroimaging.  Neurology expressed some concern for arterial dissection contributing to patient's symptoms. Other diagnoses that are less likely but still considered include TIA or hemiplegic migraine headache.  Patient requires admission for further imaging, stroke treatment and evaluation by neurology.  -admit to med-surg, attending Dr. Ardelia Mems  -neurology consulted, recommendations included below -Consider head MRI   -Cardiac monitoring - s/p Plavix 300 mg   - ASA 81 mg daily  - Plavix 75 mg daily  -Neuro checks -EKG -HIV -Lipid panel -Hemoglobin A1c -Speech evaluation, swallow screen -Up with assistance -Fall precautions -echocardiogram   -PT/OT -allow for permissive hypertension  -Vital signs per floor -Tylenol 650 mg every 4 hours as needed  COPD, Acute on chronic exacerbation Patient reports increased sputum production for over the course of the last year with recent change in sputum consistency from thick mucus to white and frothy. On exam, patient has 99% oxygen saturation on room air, with diffuse and bilateral rhonchi, diminished breath sounds at bases bilaterally, diffuse expiratory wheezing bilaterally, supraclavicular retractions while lying supine.  Patient reports orthopnea.  Home medications include Ventolin, Trelegy Ellipta, Robitussin, Atrovent and prednisone 40 mg daily.  Patient states that she only used 1 inhaler prior to presenting to the ED today but is not sure which inhaler. CXR shows chronic hyperinflation and bronchial thickening,  consistent with COPD without acute abnormality.  Patient  persistently coughing during exam while trying to take deep breaths.  Given her pulmonary auscultation findings, change of sputum production and chest x-ray findings, it is likely patient is having an acute COPD exacerbation in the setting of this recent illness and worsened by paranasal sinus findings.  Of note, patient has no home oxygen requirement.  -start Flovent 110 mg, 1 puff daily -start DuoNebs q4 hours, wean to prn as able -albuterol PRN q2 hours -COVID testing - [facility]OC pharmacy for medications at home. Was not taking Trelegy and could be due to cost  Hypertension Home medication includes Norvasc 5 mg daily.  We will allow for permissive hypertension given new stroke. -Hold home Norvasc, in setting of acute CVA - permissive hypertension -Monitor blood pressure with vital signs    Peripheral vascular disease  hyperlipidemia  Bilateral lower extremities Patient is noted to have had multiple stents placed in her lower extremities and has some residual paresthesias in her left femoral region.  Patient is prescribed atorvastatin 80 mg daily and meloxicam for some lower extremity and foot pain that she currently reports not taking.  Last lipid panel shows increased LDL at 127, normal total cholesterol 185, triglycerides 72 and HDL 44. -will repeat Lipid panel  -Continue home atorvastatin once patient passes swallow evaluation   Maxillary Retention Cysts  Patient evaluated at urgent care prior to admission with head CT imaging findings consistent with paranasal retention cysts.  Given patient has no leukocytosis or fever, will not treat with antibiotics at this time.  Patient does report improved maxillary and frontal sinus pressure. Patient denies any fevers, chills, rhinorrhea but does report some congestion.  -consider abx if sinus pain worsens -COVID testing    Chronic Pain  Patient denies any back or foot pain on admission and has meloxicam 15mg  and 7.5mg  listed as her medication for  pain on an outpatient basis.  We will hold oral medications for now as patient is evaluated for any swallowing deficits.  FEN/GI: N.p.o. until swallow evaluation is complete  Prophylaxis: Lovenox 40  Disposition: Admit to MedSurg  History of Present Illness:  Yarielis Zimmerle is a 69 y.o. female presenting with HA found to have acute CVA in right vertebral artery.   Ms. Stobaugh states that she began to have a headache 4 days prior to presentation. On the following day, she reports that she was unable to walk due to weakness in her bilateral lower extremities.  The patient reports that she had a headache that extended across her forehead and down the the right side of her face into the right lateral side of her neck.  She reports that the pain was 10 out of 10 at its worse but has improved to 5/10 after receiving fentanyl in the hospital.  She states that she had some accompanying vision blurriness with the headache, decreased appetite, tinnitus, and continues to report lingering neck pain.  She reports that she has some nausea.  Patient states that she tried to relieve the pain of her headache by sleeping along with taking Tylenol regularly.  She states that this did not help her pain.  Patient also states that her right side of her neck was painful to move but is able to rotate her head in all directions during physical exam.  Patient denies any difficulty swallowing, no numbness of her extremities, she does report some paresthesias in her right upper extremity and improved weakness in her bilateral lower  extremities while in the ED.  She reports that she has been able to walk where she was unable to walk for the past few days prior to evaluation in the ED.  Ms. Moshe Cipro also states that she was having pain/pressure around her eyes and in her forehead that she thought was due to her sinuses.  She reports being prescribed an antibiotic for sinusitis.  She denies any fevers, chills, sore throat but does  endorse congestion and cough.  Patient reports that her cough started off with thick mucus but sputum has now become white and frothy.  She denies any known contact with Covid positive patients or any sick contacts to her knowledge.  She states that she used one of her inhalers today and does have some shortness of breath while lying supine but none while sitting upright.  In the ED, patient was given fentanyl 50 mcg for pain.  She underwent chest x-ray, head CT, head CTA and neck CTA.  The findings from the head/neck images noted a an occlusion in her right vertebral artery likely due to thrombus.  CMP and CBC were obtained and unremarkable.  Vital signs were notable for elevated blood pressure.  Neurology evaluated the patient and she was treated with 300 mg of Plavix, started on aspirin 81 mg with plan to start 75 mg of Plavix on 3/5.  Patient was determined to be out of the window for TPA given time of onset of symptoms and presentation to the ED.  Review Of Systems: Per HPI with the following additions:   Review of Systems  Constitutional: Negative for chills and fever.  HENT: Positive for congestion, sinus pain and tinnitus. Negative for ear pain, nosebleeds and sore throat.   Eyes: Positive for blurred vision.  Respiratory: Positive for cough and sputum production. Negative for hemoptysis and wheezing.   Cardiovascular: Positive for orthopnea. Negative for chest pain.  Gastrointestinal: Positive for nausea. Negative for abdominal pain, constipation, diarrhea and vomiting.  Genitourinary: Negative for dysuria.  Musculoskeletal: Negative for falls.  Neurological: Positive for tingling, focal weakness and headaches.   Patient Active Problem List   Diagnosis Date Noted  . CVA (cerebral vascular accident) ([place]) 11/04/2019  . Cephalalgia   . Maxillary sinusitis   . Acute CVA (cerebrovascular accident) ([place]) 11/03/2019  . Acute non-recurrent frontal sinusitis 05/21/2018  . Prediabetes  10/27/2017  . PAD (peripheral artery disease) ([place]). left leg 09/17/2017  . Aortic atherosclerosis ([place]) 09/16/2017  . Emphysema lung ([place]) 09/16/2017  . Uses marijuana 09/16/2017  . 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 Loss of weight   . Lumbar disc herniation   . Leg pain, medial 04/11/2016  . Orthopnea 09/28/2015  . Hypertensive retinopathy of right eye, grade 1 05/03/2014  . Hypertensive retinopathy of left eye, grade 2 05/03/2014  . Dyspnea 08/10/2013  . Protein-calorie malnutrition, severe ([place]) 05/11/2013  . Cyclical vomiting [id number]  . Nausea & vomiting   . Tobacco abuse   . COPD exacerbation ([place]) 12/29/2006  . GASTROESOPHAGEAL REFLUX DISEASE 12/29/2006  . HIATAL HERNIA WITH REFLUX 12/29/2006  . PSORIASIS 12/29/2006    Past Medical History: Past Medical History:  Diagnosis Date  . Abdominal pain, epigastric 02/23/2013  . Abdominal pain, unspecified site 08/15/2013  . Abnormal neurological exam 04/11/2016  . Asthma   . Cataracts, bilateral 05/03/2014  . Chronic bronchitis ([place])   . Chronic lower back pain   . COPD (chronic obstructive pulmonary disease) (  [facility])   . Daily headache   . De Quervain's tenosynovitis, left 11/09/2014  . GERD (gastroesophageal reflux disease)   . Hx of colonic polyps   . Nausea & vomiting   . PSORIASIS 12/29/2006   Qualifier: Diagnosis of  By: Jobe Igo MD, Shanon Brow    . Right leg pain 04/02/2016  . Smoker     Past Surgical History: Past Surgical History:  Procedure Laterality Date  . ABDOMINAL AORTOGRAM W/LOWER EXTREMITY N/A 10/06/2016   Procedure: Abdominal Aortogram w/Lower Extremity;  Surgeon: Waynetta Sandy, MD;  Location: [facility];  Service: Cardiovascular;  Laterality: N/A;  . ABDOMINAL AORTOGRAM W/LOWER EXTREMITY N/A 02/10/2018   Procedure: ABDOMINAL AORTOGRAM W/LOWER EXTREMITY;  Surgeon: Waynetta Sandy, MD;  Location: [facility];  Service:  Cardiovascular;  Laterality: N/A;  . ABDOMINAL HERNIA REPAIR    . APPENDECTOMY    . COLONOSCOPY W/ POLYPECTOMY  02/26/2005  . FEMORAL-POPLITEAL BYPASS GRAFT Left 04/20/2018   Procedure: Left FEMORAL-POPLITEAL ARTERY Bypass Graft using Gore Propaten Vascular Graft;  Surgeon: Waynetta Sandy, MD;  Location: [facility];  Service: Vascular;  Laterality: Left;  . HERNIA REPAIR    . LOWER EXTREMITY ANGIOGRAPHY Bilateral 04/19/2018   Procedure: LOWER EXTREMITY ANGIOGRAPHY;  Surgeon: Waynetta Sandy, MD;  Location: [facility];  Service: Cardiovascular;  Laterality: Bilateral;  . PERIPHERAL VASCULAR ATHERECTOMY Right 10/06/2016   Procedure: Peripheral Vascular Atherectomy;  Surgeon: Waynetta Sandy, MD;  Location: [facility];  Service: Cardiovascular;  Laterality: Right;  . PERIPHERAL VASCULAR INTERVENTION Right 10/06/2016   Procedure: Peripheral Vascular Intervention;  Surgeon: Waynetta Sandy, MD;  Location: [facility];  Service: Cardiovascular;  Laterality: Right;  SFA  . PERIPHERAL VASCULAR INTERVENTION Left 02/10/2018   Procedure: PERIPHERAL VASCULAR INTERVENTION;  Surgeon: Waynetta Sandy, MD;  Location: [facility];  Service: Cardiovascular;  Laterality: Left;  . TONSILLECTOMY  1960s  . UPPER GASTROINTESTINAL ENDOSCOPY  03/22/2010, 02/13/2005  . VAGINAL HYSTERECTOMY     partial    Social History: Social History   Tobacco Use  . Smoking status: Current Every Day Smoker    Packs/day: 0.25    Years: 47.00    Pack years: 11.75    Types: Cigarettes  . Smokeless tobacco: Never Used  . Tobacco comment: 3-4 cigarettes per day  Substance Use Topics  . Alcohol use: Not Currently    Alcohol/week: 0.0 standard drinks    Comment: Quit alcohol in 1992; "never really drank much alcohol"  . Drug use: Yes    Types: Marijuana    Comment: 02/10/2018 "~ twice//wk"    Family History: Family History  Problem Relation Age of Onset  .  Cancer Other   . Coronary artery disease Other   . Cancer Mother   . Hypertension Mother   . Heart disease Mother   . Cancer Father   . Hypertension Father   . Cancer Sister   . Hypertension Sister     Allergies and Medications: No Known Allergies No current facility-administered medications on file prior to encounter.   Current Outpatient Medications on File Prior to Encounter  Medication Sig Dispense Refill  . aspirin EC 81 MG tablet Take 81 mg by mouth daily.    Marland Kitchen atorvastatin (LIPITOR) 80 MG tablet TAKE 1 TABLET BY MOUTH ONCE DAILY AT 6 PM (Patient taking differently: Take 80 mg by mouth daily at 6 PM. ) 30 tablet 4  . clopidogrel (PLAVIX) 75 MG tablet  Take 1 tablet (75 mg total) by mouth daily. 30 tablet 3  . VENTOLIN HFA 108 (90 Base) MCG/ACT inhaler INHALE 2 PUFFS BY MOUTH EVERY 4 HOURS AS NEEDED FOR WHEEZING OR SHORTNESS OF BREATH (Patient taking differently: Inhale 2 puffs into the lungs every 4 (four) hours as needed for wheezing or shortness of breath. ) 18 g 0  . amLODipine (NORVASC) 5 MG tablet Take 1 tablet (5 mg total) by mouth daily. 90 tablet 0  . amoxicillin-clavulanate (AUGMENTIN) 875-125 MG tablet Take 1 tablet by mouth 2 (two) times daily. 14 tablet 0  . Fluticasone-Umeclidin-Vilant (TRELEGY ELLIPTA) 100-62.5-25 MCG/INH AEPB Inhale 1 puff into the lungs daily. 60 each 0  . guaiFENesin (ROBITUSSIN) 100 MG/5ML SOLN Take 5 mLs (100 mg total) by mouth every 4 (four) hours as needed for cough or to loosen phlegm. (Patient not taking: Reported on 11/03/2019) 1200 mL 0  . ipratropium (ATROVENT) 0.03 % nasal spray Place 2 sprays into both nostrils every 12 (twelve) hours. (Patient not taking: Reported on 11/03/2019) 30 mL 12  . meloxicam (MOBIC) 15 MG tablet TAKE 1 TABLET BY MOUTH EVERY DAY WITH FOOD AS NEEDED FOR FOOT PAIN (Patient not taking: Reported on 11/03/2019) 90 tablet 0  . meloxicam (MOBIC) 7.5 MG tablet TAKE 1 TABLET BY MOUTH EVERY DAY FOR 4 DAYS THEN ONLY IF NEEDED FOR  FOOT PAIN (Patient not taking: Reported on 11/03/2019) 90 tablet 0  . predniSONE (DELTASONE) 20 MG tablet Take 2 tablets daily with breakfast. (Patient taking differently: Take 40 mg by mouth daily with breakfast. ) 10 tablet 0  . promethazine (PHENERGAN) 25 MG tablet TAKE 1 TABLET BY MOUTH EVERY 6 HOURS AS NEEDED FOR NAUSEA OR VOMITING (Patient not taking: Reported on 11/03/2019) 15 tablet 0    Objective: BP 107/67 (BP Location: Left Arm)   Pulse 87   Temp 98.8 F (37.1 C) (Oral)   Resp 18   SpO2 100%   Exam: General: Female appearing stated age in no acute distress sitting up in bed, calm Eyes: No conjunctival injection, pupils constricted, extraocular muscles intact bilaterally ENTM: No rhinorrhea appreciated, no oropharyngeal erythema, moist mucous membranes Neck: No large lymph nodes, some tenderness on right side of neck, no enlarged thyroid, demonstrates rotation in flexion and extension with some limited to right rotation secondary to pain Cardiovascular: Regular rate and rhythm, no murmurs appreciated, bilateral radial pulses palpable Respiratory: Diffuse and bilateral expiratory wheezing, minutes breath sounds at bilateral bases, rhonchi present diffusely in bilateral, some supraclavicular retractions noted while lying supine, patient on room air with 99% saturation Gastrointestinal: Soft and nontender to palpation, bowel sounds present throughout, no masses palpated Derm: No lesions or ulcerations appreciated, no rashes no erythema or areas that are warm to touch Neuro: 5/5 strength in bilateral upper extremities, 5/5 strength in right lower extremity, 4/5 strength in left lower extremity, decreased sensation in right forehead in comparison to left, otherwise sensation equal throughout, normal range of motion in bilateral upper and lower extremities in the neck, cranial nerves II-XII grossly intact bilaterally, patient is alert and oriented x4  Labs and Imaging: CBC BMET  Recent Labs   Lab 11/03/19 1612 11/03/19 1612 11/03/19 1633  WBC 8.1  --   --   HGB 12.4   < > 13.6  HCT 39.4   < > 40.0  PLT 276  --   --    < > = values in this interval not displayed.   Recent Labs  Lab 11/03/19  1612 11/03/19 1612 11/03/19 1633  NA 140   < > 141  K 3.6   < > 3.6  CL 105   < > 105  CO2 24  --   --   BUN 5*   < > 7*  CREATININE 0.95   < > 0.80  GLUCOSE 116*   < > 111*  CALCIUM 9.1  --   --    < > = values in this interval not displayed.     EKG: Normal sinus rhythm  CT Angio Head W or Wo Contrast  Result Date: 11/03/2019 CLINICAL DATA:  Carotid stenosis. Weakness. Abnormal hyperdense appearance of the distal right vertebral artery on earlier noncontrast head CT. EXAM: CT ANGIOGRAPHY HEAD AND NECK TECHNIQUE: Multidetector CT imaging of the head and neck was performed using the standard protocol during bolus administration of intravenous contrast. Multiplanar CT image reconstructions and MIPs were obtained to evaluate the vascular anatomy. Carotid stenosis measurements (when applicable) are obtained utilizing NASCET criteria, using the distal internal carotid diameter as the denominator. CONTRAST:  183mL OMNIPAQUE IOHEXOL 350 MG/ML SOLN COMPARISON:  None. FINDINGS: CTA NECK FINDINGS Aortic arch: Standard 3 vessel aortic arch with mild atherosclerotic plaque. Widely patent arch vessel origins. Right carotid system: Patent with moderate volume calcified plaque at the carotid bifurcation. No evidence of significant stenosis or dissection. Left carotid system: Patent without evidence of stenosis or dissection. Vertebral arteries: There is severe stenosis of the proximal right V1 segment, and there is occlusion of the V2 and V3 segments beginning at the C6 level without distal reconstitution. The left vertebral artery is patent with calcified plaque at its origin resulting in mild stenosis. Skeleton: No acute osseous abnormality or suspicious osseous lesion. Other neck: No evidence of  cervical lymphadenopathy or mass. Upper chest: Mild centrilobular and paraseptal emphysema. Review of the MIP images confirms the above findings CTA HEAD FINDINGS Anterior circulation: The internal carotid arteries are patent from skull base to carotid termini with mild calcified plaque bilaterally not resulting in significant stenosis. ACAs and MCAs are patent without evidence of proximal branch occlusion or significant proximal stenosis. No aneurysm is identified. Posterior circulation: The intracranial right vertebral artery is occluded with minimal retrograde opacification distally. The intracranial left vertebral artery is patent and supplies the basilar. There is a patent left PICA. A right PICA is not identified. The basilar artery is widely patent. Posterior communicating arteries are diminutive or absent. Both PCAs are patent without evidence of significant proximal stenosis. No aneurysm is identified. Venous sinuses: Patent. Anatomic variants: None. Review of the MIP images confirms the above findings IMPRESSION: 1. 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 2. Mild left vertebral artery origin stenosis. 3. No significant anterior intracranial arterial stenosis or cervical carotid artery stenosis. 4. Aortic Atherosclerosis (ICD10-I70.0) and Emphysema (ICD10-J43.9). Electronically Signed   By: Logan Bores M.D.   On: 11/03/2019 21:40   DG Chest 2 View  Result Date: 11/03/2019 CLINICAL DATA:  Cough and shortness of breath. EXAM: CHEST - 2 VIEW COMPARISON:  Chest radiograph 03/24/2019 FINDINGS: Chronic hyperinflation and bronchial thickening. No focal airspace disease. Unchanged heart size and mediastinal contours. Aortic atherosclerosis. No pulmonary edema. No pleural effusion or pneumothorax. No acute osseous abnormalities are seen. IMPRESSION: Chronic hyperinflation and bronchial thickening, imaging findings consistent with  COPD. No acute abnormality. Aortic Atherosclerosis (ICD10-I70.0). Electronically Signed   By: Keith Rake M.D.   On: 11/03/2019  15:21   CT HEAD WO CONTRAST  Result Date: 11/03/2019 CLINICAL DATA:  Possible stroke; neuro deficits, subacute. Additional history provided: Nasal congestion with headache, generalized weakness for 2 days, throbbing sensation in head. EXAM: CT HEAD WITHOUT CONTRAST TECHNIQUE: Contiguous axial images were obtained from the base of the skull through the vertex without intravenous contrast. COMPARISON:  Head CT 03/24/2019, brain MRI 11/13/2011 FINDINGS: Brain: There is no evidence of acute intracranial hemorrhage, intracranial mass, midline shift or extra-axial fluid collection.No demarcated cortical infarction. Patchy hypodensity within the cerebral white matter is similar as compared to prior head CT 03/24/2019. Findings are nonspecific, but consistent with chronic small vessel ischemic disease. Redemonstrated chronic infarcts in the bilateral cerebellar hemispheres. Cerebral volume is normal for age. Vascular: There is asymmetric hyperdensity of the V4 right vertebral artery (series 3, image 4) (series 6, image 28). Atherosclerotic calcifications. Skull: Normal. Negative for fracture or focal lesion. Sinuses/Orbits: Visualized orbits demonstrate no acute abnormality. Small mucous retention cyst within the inferior right maxillary sinus. Bilateral sphenoid sinus mucosal thickening. Moderate-sized right sphenoid sinus mucous retention cyst. Findings of possible hyperdense intracranial right vertebral artery called by telephone at the time of interpretation on 11/03/2019 at 6:38 pm to provider Veryl Speak , who verbally acknowledged these results. IMPRESSION: Asymmetric hyperdensity of the intracranial right vertebral artery. Given the provided history, consider CTA head/neck to exclude thrombus within this vessel. Otherwise, no evidence of acute intracranial abnormality. Stable chronic  small vessel ischemic disease. Redemonstrated chronic infarcts in the bilateral cerebellar hemispheres. Paranasal sinus disease as described with multiple mucous retention cysts. Electronically Signed   By: Kellie Simmering DO   On: 11/03/2019 18:39   CT Angio Neck W and/or Wo Contrast  Result Date: 11/03/2019 CLINICAL DATA:  Carotid stenosis. Weakness. Abnormal hyperdense appearance of the distal right vertebral artery on earlier noncontrast head CT. EXAM: CT ANGIOGRAPHY HEAD AND NECK TECHNIQUE: Multidetector CT imaging of the head and neck was performed using the standard protocol during bolus administration of intravenous contrast. Multiplanar CT image reconstructions and MIPs were obtained to evaluate the vascular anatomy. Carotid stenosis measurements (when applicable) are obtained utilizing NASCET criteria, using the distal internal carotid diameter as the denominator. CONTRAST:  141mL OMNIPAQUE IOHEXOL 350 MG/ML SOLN COMPARISON:  None. FINDINGS: CTA NECK FINDINGS Aortic arch: Standard 3 vessel aortic arch with mild atherosclerotic plaque. Widely patent arch vessel origins. Right carotid system: Patent with moderate volume calcified plaque at the carotid bifurcation. No evidence of significant stenosis or dissection. Left carotid system: Patent without evidence of stenosis or dissection. Vertebral arteries: There is severe stenosis of the proximal right V1 segment, and there is occlusion of the V2 and V3 segments beginning at the C6 level without distal reconstitution. The left vertebral artery is patent with calcified plaque at its origin resulting in mild stenosis. Skeleton: No acute osseous abnormality or suspicious osseous lesion. Other neck: No evidence of cervical lymphadenopathy or mass. Upper chest: Mild centrilobular and paraseptal emphysema. Review of the MIP images confirms the above findings CTA HEAD FINDINGS Anterior circulation: The internal carotid arteries are patent from skull base to carotid  termini with mild calcified plaque bilaterally not resulting in significant stenosis. ACAs and MCAs are patent without evidence of proximal branch occlusion or significant proximal stenosis. No aneurysm is identified. Posterior circulation: The intracranial right vertebral artery is occluded with minimal retrograde opacification distally. The intracranial left vertebral artery is patent and supplies the basilar. There is a patent left PICA. A right PICA is  not identified. The basilar artery is widely patent. Posterior communicating arteries are diminutive or absent. Both PCAs are patent without evidence of significant proximal stenosis. No aneurysm is identified. Venous sinuses: Patent. Anatomic variants: None. Review of the MIP images confirms the above findings IMPRESSION: 1. 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 2. Mild left vertebral artery origin stenosis. 3. No significant anterior intracranial arterial stenosis or cervical carotid artery stenosis. 4. Aortic Atherosclerosis (ICD10-I70.0) and Emphysema (ICD10-J43.9). Electronically Signed   By: Logan Bores M.D.   On: 11/03/2019 21:40    Stark Klein, MD 11/04/2019, 5:05 AM PGY-1, [facility] pager: [phone], text pages welcome  Resident Attestation   I saw and evaluated the patient, performing the key elements of the service. I personally performed or re-performed the history, physical exam, and medical decision making activities of this service and have verified that the service and findings are accurately documented in the resident's note. I developed the management plan that is described in the resident's note, and I agree with the content, with my edits above in red.   Harolyn Rutherford, DO [facility], PGY-3

## 2019-11-03 NOTE — Discharge Instructions (Addendum)
Having weakness on one side of your body together with high blood pressure can be a sign of stroke. Currently the exam I did does not suggest that you are having a stroke. However, if this is your primary concern then please report to the ER now to have a head CT scan and more labs drawn.

## 2019-11-03 NOTE — ED Triage Notes (Signed)
Pt here for cough and congestion; pt noted to have wheezing; pt sts out of asthma meds x 1 week; pt sts generalized weakness x 2 days

## 2019-11-03 NOTE — ED Notes (Signed)
Called pt for vital signs, no response x1

## 2019-11-03 NOTE — ED Provider Notes (Signed)
[facility]   MRN: [id number] DOB: May 14, 1951  Subjective:   Brooklinn Fujitani is a 69 y.o. female presenting for 1 week history of persistent and worsening cough, wheezing and shortness of breath.  Patient has also had significant congestion of her sinuses and is now having right-sided facial pain pain of the upper right side of her mouth.  Patient has history of COPD and asthma, has been out of her inhaler since Sunday.  Reports having had right-sided weakness yesterday but not today.  No current facility-administered medications for this encounter.  Current Outpatient Medications:  .  amoxicillin-clavulanate (AUGMENTIN) 875-125 MG tablet, Take 1 tablet by mouth 2 (two) times daily. (Patient not taking: Reported on 11/03/2019), Disp: 14 tablet, Rfl: 0 .  aspirin EC 81 MG tablet, Take 81 mg by mouth daily., Disp: , Rfl:  .  atorvastatin (LIPITOR) 80 MG tablet, TAKE 1 TABLET BY MOUTH ONCE DAILY AT 6 PM, Disp: 30 tablet, Rfl: 4 .  clopidogrel (PLAVIX) 75 MG tablet, Take 1 tablet (75 mg total) by mouth daily., Disp: 30 tablet, Rfl: 3 .  guaiFENesin (ROBITUSSIN) 100 MG/5ML SOLN, Take 5 mLs (100 mg total) by mouth every 4 (four) hours as needed for cough or to loosen phlegm., Disp: 1200 mL, Rfl: 0 .  ipratropium (ATROVENT) 0.03 % nasal spray, Place 2 sprays into both nostrils every 12 (twelve) hours., Disp: 30 mL, Rfl: 12 .  meloxicam (MOBIC) 15 MG tablet, TAKE 1 TABLET BY MOUTH EVERY DAY WITH FOOD AS NEEDED FOR FOOT PAIN, Disp: 90 tablet, Rfl: 0 .  meloxicam (MOBIC) 7.5 MG tablet, TAKE 1 TABLET BY MOUTH EVERY DAY FOR 4 DAYS THEN ONLY IF NEEDED FOR FOOT PAIN, Disp: 90 tablet, Rfl: 0 .  promethazine (PHENERGAN) 25 MG tablet, TAKE 1 TABLET BY MOUTH EVERY 6 HOURS AS NEEDED FOR NAUSEA OR VOMITING, Disp: 15 tablet, Rfl: 0 .  TRELEGY ELLIPTA 100-62.5-25 MCG/INH AEPB, INHALE 1 PUFF INTO THE LUNGS DAILY, Disp: 60 each, Rfl: 0 .  VENTOLIN HFA 108 (90 Base) MCG/ACT inhaler, INHALE 2 PUFFS BY MOUTH EVERY 4  HOURS AS NEEDED FOR WHEEZING OR SHORTNESS OF BREATH, Disp: 18 g, Rfl: 0   No Known Allergies  Past Medical History:  Diagnosis Date  . Abdominal pain, epigastric 02/23/2013  . Abdominal pain, unspecified site 08/15/2013  . Abnormal neurological exam 04/11/2016  . Asthma   . Cataracts, bilateral 05/03/2014  . Chronic bronchitis ([place])   . Chronic lower back pain   . COPD (chronic obstructive pulmonary disease) ([place])   . Daily headache   . De Quervain's tenosynovitis, left 11/09/2014  . GERD (gastroesophageal reflux disease)   . Hx of colonic polyps   . Nausea & vomiting   . PSORIASIS 12/29/2006   Qualifier: Diagnosis of  By: Jobe Igo MD, Shanon Brow    . Right leg pain 04/02/2016  . Smoker      Past Surgical History:  Procedure Laterality Date  . ABDOMINAL AORTOGRAM W/LOWER EXTREMITY N/A 10/06/2016   Procedure: Abdominal Aortogram w/Lower Extremity;  Surgeon: Waynetta Sandy, MD;  Location: [facility];  Service: Cardiovascular;  Laterality: N/A;  . ABDOMINAL AORTOGRAM W/LOWER EXTREMITY N/A 02/10/2018   Procedure: ABDOMINAL AORTOGRAM W/LOWER EXTREMITY;  Surgeon: Waynetta Sandy, MD;  Location: [facility];  Service: Cardiovascular;  Laterality: N/A;  . ABDOMINAL HERNIA REPAIR    . APPENDECTOMY    . COLONOSCOPY W/ POLYPECTOMY  02/26/2005  . FEMORAL-POPLITEAL BYPASS GRAFT Left 04/20/2018   Procedure: Left  FEMORAL-POPLITEAL ARTERY Bypass Graft using Gore Propaten Vascular Graft;  Surgeon: Waynetta Sandy, MD;  Location: [facility];  Service: Vascular;  Laterality: Left;  . HERNIA REPAIR    . LOWER EXTREMITY ANGIOGRAPHY Bilateral 04/19/2018   Procedure: LOWER EXTREMITY ANGIOGRAPHY;  Surgeon: Waynetta Sandy, MD;  Location: [facility];  Service: Cardiovascular;  Laterality: Bilateral;  . PERIPHERAL VASCULAR ATHERECTOMY Right 10/06/2016   Procedure: Peripheral Vascular Atherectomy;  Surgeon: Waynetta Sandy, MD;  Location: [facility];   Service: Cardiovascular;  Laterality: Right;  . PERIPHERAL VASCULAR INTERVENTION Right 10/06/2016   Procedure: Peripheral Vascular Intervention;  Surgeon: Waynetta Sandy, MD;  Location: [facility];  Service: Cardiovascular;  Laterality: Right;  SFA  . PERIPHERAL VASCULAR INTERVENTION Left 02/10/2018   Procedure: PERIPHERAL VASCULAR INTERVENTION;  Surgeon: Waynetta Sandy, MD;  Location: [facility];  Service: Cardiovascular;  Laterality: Left;  . TONSILLECTOMY  1960s  . UPPER GASTROINTESTINAL ENDOSCOPY  03/22/2010, 02/13/2005  . VAGINAL HYSTERECTOMY     partial    Family History  Problem Relation Age of Onset  . Cancer Other   . Coronary artery disease Other   . Cancer Mother   . Hypertension Mother   . Heart disease Mother   . Cancer Father   . Hypertension Father   . Cancer Sister   . Hypertension Sister     Social History   Tobacco Use  . Smoking status: Current Every Day Smoker    Packs/day: 0.25    Years: 47.00    Pack years: 11.75    Types: Cigarettes  . Smokeless tobacco: Never Used  . Tobacco comment: 3-4 cigarettes per day  Substance Use Topics  . Alcohol use: Not Currently    Alcohol/week: 0.0 standard drinks    Comment: Quit alcohol in 1992; "never really drank much alcohol"  . Drug use: Yes    Types: Marijuana    Comment: 02/10/2018 "~ twice//wk"    Review of Systems  Constitutional: Positive for malaise/fatigue. Negative for fever.  HENT: Positive for congestion, sinus pain and sore throat. Negative for ear pain.   Eyes: Negative for blurred vision, double vision, discharge and redness.  Respiratory: Positive for cough, sputum production, shortness of breath and wheezing. Negative for hemoptysis.   Cardiovascular: Negative for chest pain.  Gastrointestinal: Negative for abdominal pain, diarrhea, nausea and vomiting.  Genitourinary: Negative for dysuria, flank pain and hematuria.  Musculoskeletal: Negative for myalgias.    Skin: Negative for rash.  Neurological: Positive for weakness. Negative for dizziness and headaches.  Psychiatric/Behavioral: Negative for depression and substance abuse.     Objective:   Vitals: BP (!) 179/104 (BP Location: Left Arm)   Pulse 96   Temp 98.7 F (37.1 C) (Oral)   Resp 20   SpO2 96%   BP was 160/84 on recheck by PA-Mekaylah Klich.   BP Readings from Last 3 Encounters:  11/03/19 (!) 179/104  03/24/19 (!) 178/85  06/11/18 (!) 173/97    Physical Exam Constitutional:      General: She is not in acute distress.    Appearance: Normal appearance. She is well-developed. She is not ill-appearing, toxic-appearing or diaphoretic.  HENT:     Head: Normocephalic and atraumatic.     Right Ear: Tympanic membrane, ear canal and external ear normal. There is no impacted cerumen.     Left Ear: Tympanic membrane, ear canal and external ear normal. There is no impacted cerumen.     Nose:  Nose normal.     Comments: Right-sided maxillary sinus tenderness.    Mouth/Throat:     Mouth: Mucous membranes are moist.     Pharynx: Oropharynx is clear.     Comments: Significant pnd overlying throat. Eyes:     General: No scleral icterus.       Right eye: No discharge.        Left eye: No discharge.     Extraocular Movements: Extraocular movements intact.     Pupils: Pupils are equal, round, and reactive to light.  Cardiovascular:     Rate and Rhythm: Normal rate and regular rhythm.     Pulses: Normal pulses.     Heart sounds: Normal heart sounds. No murmur. No friction rub. No gallop.   Pulmonary:     Effort: Pulmonary effort is normal. No respiratory distress.     Breath sounds: No stridor. Wheezing (throughout) and rhonchi (throughout) present. No rales.  Musculoskeletal:     Comments: Strength testing in upper and lower extremities even throughout.  Skin:    General: Skin is warm and dry.     Findings: No rash.  Neurological:     General: No focal deficit present.     Mental  Status: She is alert and oriented to person, place, and time.     Cranial Nerves: No cranial nerve deficit.     Motor: No weakness.     Coordination: Coordination normal.     Gait: Gait normal.     Deep Tendon Reflexes: Reflexes normal.     Comments: Negative Romberg and pronator drift.  Psychiatric:        Mood and Affect: Mood normal.        Behavior: Behavior normal.        Thought Content: Thought content normal.        Judgment: Judgment normal.    DG Chest 2 View  Result Date: 11/03/2019 CLINICAL DATA:  Cough and shortness of breath. EXAM: CHEST - 2 VIEW COMPARISON:  Chest radiograph 03/24/2019 FINDINGS: Chronic hyperinflation and bronchial thickening. No focal airspace disease. Unchanged heart size and mediastinal contours. Aortic atherosclerosis. No pulmonary edema. No pleural effusion or pneumothorax. No acute osseous abnormalities are seen. IMPRESSION: Chronic hyperinflation and bronchial thickening, imaging findings consistent with COPD. No acute abnormality. Aortic Atherosclerosis (ICD10-I70.0). Electronically Signed   By: Keith Rake M.D.   On: 11/03/2019 15:21    Assessment and Plan :   1. Acute maxillary sinusitis, recurrence not specified   2. Sinus pain   3. Essential hypertension   4. Elevated blood pressure reading   5. Right sided weakness   6. Chronic obstructive pulmonary disease, unspecified COPD type ([place])   7. Cough   8. Facial pain     Had extensive discussion with patient about managing sinusitis with Augmentin. Recommended prednisone given her sinusitis and COPD, not having her inhalers for ~3 days now.  Patient has uncontrolled hypertension and has not been taking any of her blood pressure medications, recommended start amlodipine.  She also ran out of her COPD inhalers which I refilled today.  Given her blood pressure, patient had concerns about a stroke.  I discussed with her that she does not have physical exam findings suggesting this but if she  wanted more work-up, head CT could be obtained if deemed medically necessary through the emergency room.  I did emphasize need for close follow-up with her PCP.  Discussed signs of stroke and ACS that warrant emergency evaluation.  Patient states that she will report to the ER now to make sure she is not having a stroke. Counseled patient on potential for adverse effects with medications prescribed/recommended today, ER and return-to-clinic precautions discussed, patient verbalized understanding.   Jaynee Eagles, PA-C 11/03/19 [id number]

## 2019-11-04 ENCOUNTER — Inpatient Hospital Stay (HOSPITAL_COMMUNITY): Payer: Medicare HMO

## 2019-11-04 DIAGNOSIS — I639 Cerebral infarction, unspecified: Secondary | ICD-10-CM | POA: Diagnosis present

## 2019-11-04 DIAGNOSIS — I361 Nonrheumatic tricuspid (valve) insufficiency: Secondary | ICD-10-CM

## 2019-11-04 DIAGNOSIS — R519 Headache, unspecified: Secondary | ICD-10-CM | POA: Insufficient documentation

## 2019-11-04 DIAGNOSIS — I63449 Cerebral infarction due to embolism of unspecified cerebellar artery: Secondary | ICD-10-CM

## 2019-11-04 DIAGNOSIS — J320 Chronic maxillary sinusitis: Secondary | ICD-10-CM | POA: Insufficient documentation

## 2019-11-04 HISTORY — DX: Cerebral infarction, unspecified: I63.9

## 2019-11-04 LAB — HIV ANTIBODY (ROUTINE TESTING W REFLEX): HIV Screen 4th Generation wRfx: NONREACTIVE

## 2019-11-04 LAB — LIPID PANEL
Cholesterol: 202 mg/dL — ABNORMAL HIGH (ref 0–200)
HDL: 37 mg/dL — ABNORMAL LOW (ref >40)
LDL Cholesterol: 146 mg/dL — ABNORMAL HIGH (ref 0–99)
Total CHOL/HDL Ratio: 5.5 RATIO
Triglycerides: 93 mg/dL (ref <150)
VLDL: 19 mg/dL (ref 0–40)

## 2019-11-04 LAB — SARS CORONAVIRUS 2 (TAT 6-24 HRS): SARS Coronavirus 2: NEGATIVE

## 2019-11-04 LAB — HEMOGLOBIN A1C
Hgb A1c MFr Bld: 5.7 % — ABNORMAL HIGH (ref 4.8–5.6)
Mean Plasma Glucose: 116.89 mg/dL

## 2019-11-04 LAB — ECHOCARDIOGRAM COMPLETE

## 2019-11-04 MED ORDER — STROKE: EARLY STAGES OF RECOVERY BOOK
Freq: Once | Status: AC
  Filled 2019-11-04 (×2): qty 1

## 2019-11-04 MED ORDER — IPRATROPIUM BROMIDE 0.02 % IN SOLN
0.5000 mg | RESPIRATORY_TRACT | Status: DC
  Administered 2019-11-04: 0.5 mg via RESPIRATORY_TRACT
  Filled 2019-11-04: qty 2.5

## 2019-11-04 MED ORDER — PREDNISONE 20 MG PO TABS
40.0000 mg | ORAL_TABLET | Freq: Every day | ORAL | Status: DC
  Administered 2019-11-04 – 2019-11-05 (×2): 40 mg via ORAL
  Filled 2019-11-04 (×2): qty 2

## 2019-11-04 MED ORDER — AMOXICILLIN-POT CLAVULANATE 875-125 MG PO TABS
1.0000 | ORAL_TABLET | Freq: Two times a day (BID) | ORAL | Status: DC
  Administered 2019-11-04 – 2019-11-05 (×3): 1 via ORAL
  Filled 2019-11-04 (×3): qty 1

## 2019-11-04 MED ORDER — ALBUTEROL SULFATE (2.5 MG/3ML) 0.083% IN NEBU: 2.5000 mg | INHALATION_SOLUTION | RESPIRATORY_TRACT | Status: DC | PRN

## 2019-11-04 MED ORDER — IPRATROPIUM BROMIDE 0.02 % IN SOLN
0.5000 mg | Freq: Three times a day (TID) | RESPIRATORY_TRACT | Status: DC
  Administered 2019-11-04 – 2019-11-05 (×3): 0.5 mg via RESPIRATORY_TRACT
  Filled 2019-11-04 (×3): qty 2.5

## 2019-11-04 MED ORDER — ACETAMINOPHEN 650 MG RE SUPP: 650.0000 mg | RECTAL | Status: DC | PRN

## 2019-11-04 MED ORDER — ATORVASTATIN CALCIUM 80 MG PO TABS
80.0000 mg | ORAL_TABLET | Freq: Every day | ORAL | Status: DC
  Administered 2019-11-04 – 2019-11-05 (×2): 80 mg via ORAL
  Filled 2019-11-04 (×2): qty 1

## 2019-11-04 MED ORDER — ACETAMINOPHEN 325 MG PO TABS
650.0000 mg | ORAL_TABLET | ORAL | Status: DC | PRN
  Administered 2019-11-04 (×3): 650 mg via ORAL
  Filled 2019-11-04 (×3): qty 2

## 2019-11-04 MED ORDER — SODIUM CHLORIDE 0.9 % IV SOLN: INTRAVENOUS | Status: AC

## 2019-11-04 MED ORDER — ACETAMINOPHEN 160 MG/5ML PO SOLN: 650.0000 mg | ORAL | Status: DC | PRN

## 2019-11-04 MED ORDER — FLUTICASONE PROPIONATE HFA 110 MCG/ACT IN AERO: 1.0000 | INHALATION_SPRAY | Freq: Every day | RESPIRATORY_TRACT | Status: DC

## 2019-11-04 MED ORDER — IPRATROPIUM-ALBUTEROL 0.5-2.5 (3) MG/3ML IN SOLN
3.0000 mL | RESPIRATORY_TRACT | Status: DC
  Administered 2019-11-04: 3 mL via RESPIRATORY_TRACT
  Filled 2019-11-04: qty 3

## 2019-11-04 MED ORDER — ENOXAPARIN SODIUM 40 MG/0.4ML ~~LOC~~ SOLN
40.0000 mg | SUBCUTANEOUS | Status: DC
  Administered 2019-11-04 – 2019-11-05 (×2): 40 mg via SUBCUTANEOUS
  Filled 2019-11-04 (×2): qty 0.4

## 2019-11-04 MED ORDER — BUDESONIDE 0.25 MG/2ML IN SUSP
0.2500 mg | Freq: Two times a day (BID) | RESPIRATORY_TRACT | Status: DC
  Administered 2019-11-04 – 2019-11-05 (×2): 0.25 mg via RESPIRATORY_TRACT
  Filled 2019-11-04 (×3): qty 2

## 2019-11-04 NOTE — Progress Notes (Signed)
  Echocardiogram 2D Echocardiogram has been performed.  Jennette Dubin 11/04/2019, 9:20 AM

## 2019-11-04 NOTE — Progress Notes (Addendum)
Family Medicine Teaching Service Daily Progress Note Intern Pager: [phone]  Patient name: [name] Medical record number: [id number] Date of birth: [birth year] Age: 69 y.o. Gender: female  Primary Care Provider: [name], DO Consultants: Neurology  Code Status: Full   Pt Overview and Major Events to Date:  [name] is a 69 y.o. female presenting with headache, found to have occlusions in the right vertebral artery segments V2 to V4 and acute CVA. PMH is significant for HTN, PAD (s/p bilateral lower extremity stent placement in 2018 & 2019), COPD, GERD, HLD and Tobacco use.  Assessment and Plan:  Right vertebral artery thrombus with CVA Patient presented with headache for 4 days.  He also noted bilateral lower extremity weakness for 3 days.  In the ED head CT showed chronic small vessel ischemic disease with chronic infarcts in bilateral cerebellar hemispheres.  Head and neck CTA noted occlusion of V2 to V4 segments of the right vertebral artery with an apparent suggesting potentially acute thrombus with mid left vertebral artery origin stenosis. -Neurology consulted, recommendations appreciated -MRI ordered by neurology -Continuous cardiac monitoring -S/p Plavix 100 mg ASA 81 mg and Plavix 75 mg daily -Neurochecks -Speech eval systems -Fall precautions -Echocardiogram -PT/OT eval and treat -Permissive hypertension less than 220/110 -Vital -Tylenol 650 mg every 4 hours as needed  COPD, acute on chronic exacerbation Patient O2 saturation has been 96-100% since admission on room air.  Chest x-ray in the ED showed chronic hyperinflation and bronchial thickening consistent with COPD without acute abnormality.  Home meds include Ventolin, Trelegy Ellipta, Robitussin, Atrovent, prednisone 40 mg daily.  Patient never started the prednisone prior to admission.  Patient is Covid negative. -Flovent 110 mg, 1 puff daily -Pulmicort 0.25 mg twice daily -Atrovent 0.5 mg nebulized  every 4 hours -Prednisone 40 mg daily -Augmentin 875-125 mg  Hypertension Home meds include Norvasc 5 mg daily -Permissive hypertension given new stroke 220/110 -Hold home Norvasc -Monitor blood pressure and vital signs  Peripheral vascular disease  hyperlipidemia  Bilateral lower extremities Lipid panel on admission showed cholesterol of 202, HDL-37, LDL-146.  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  Patient has no leukocytosis and is afebrile. -Consider antibiotics if patient symptom worsens, he becomes febrile, he has leukocytosis  FEN/GI: N.p.o. until swallow eval PPx: Lovenox 40 mg  Disposition: Pending eval and treatment per PT/OT, home vs SNF vs CIR  Subjective:  Patient reports she is doing well this morning.  Her headache is resolved.  She denies any chest pain.  She feels like her weakness is getting better.  Objective: Temp:  [98.4 F (36.9 C)-98.8 F (37.1 C)] 98.8 F (37.1 C) (03/05 0330) Pulse Rate:  [86-96] 87 (03/05 0330) Resp:  [12-22] 18 (03/05 0330) BP: (93-179)/(65-104) 107/67 (03/05 0330) SpO2:  [96 %-100 %] 100 % (03/05 0330) Physical Exam: General: Resting in the emergency room bed, no acute distress Cardiovascular: Regular rate and rhythm, no murmurs appreciated Respiratory: Patient has diffuse inspiratory and expiratory wheezes.  Rhonchi are present bilaterally.  Patient is on room air satting 100% at the time of evaluation Abdomen: Soft, nontender, positive bowel sounds Extremities: No edema noted Neuro: Cranial nerves intact, patient denies any loss of sensation to touch on face.  5 out of 5 strength in bilateral upper and lower extremities.   Laboratory: Recent Labs  Lab 11/03/19 1612 11/03/19 1633  WBC 8.1  --   HGB 12.4 13.6  HCT 39.4  40.0  PLT 276  --    Recent Labs  Lab 11/03/19 1612 11/03/19 1633  NA 140 141  K 3.6 3.6  CL 105 105  CO2 24  --   BUN 5* 7*  CREATININE 0.95 0.80  CALCIUM 9.1  --   PROT 7.2  --   BILITOT 0.5  --   ALKPHOS 84  --   ALT 8  --   AST 16  --   GLUCOSE 116* 111*   Cholesterol-202 HDL-37 LDL-146 Triglycerides 93  VLDL-19 Hemoglobin A1c-5.7  Imaging/Diagnostic Tests: CT Angio Head W or Wo Contrast  Result Date: 11/03/2019 CLINICAL DATA:  Carotid stenosis. Weakness. Abnormal hyperdense appearance of the distal right vertebral artery on earlier noncontrast head CT. EXAM: CT ANGIOGRAPHY HEAD AND NECK TECHNIQUE: Multidetector CT imaging of the head and neck was performed using the standard protocol during bolus administration of intravenous contrast. Multiplanar CT image reconstructions and MIPs were obtained to evaluate the vascular anatomy. Carotid stenosis measurements (when applicable) are obtained utilizing NASCET criteria, using the distal internal carotid diameter as the denominator. CONTRAST:  171mL OMNIPAQUE IOHEXOL 350 MG/ML SOLN COMPARISON:  None. FINDINGS: CTA NECK FINDINGS Aortic arch: Standard 3 vessel aortic arch with mild atherosclerotic plaque. Widely patent arch vessel origins. Right carotid system: Patent with moderate volume calcified plaque at the carotid bifurcation. No evidence of significant stenosis or dissection. Left carotid system: Patent without evidence of stenosis or dissection. Vertebral arteries: There is severe stenosis of the proximal right V1 segment, and there is occlusion of the V2 and V3 segments beginning at the C6 level without distal reconstitution. The left vertebral artery is patent with calcified plaque at its origin resulting in mild stenosis. Skeleton: No acute osseous abnormality or suspicious osseous lesion. Other neck: No evidence of cervical lymphadenopathy or mass. Upper chest: Mild centrilobular and paraseptal emphysema. Review of the MIP  images confirms the above findings CTA HEAD FINDINGS Anterior circulation: The internal carotid arteries are patent from skull base to carotid termini with mild calcified plaque bilaterally not resulting in significant stenosis. ACAs and MCAs are patent without evidence of proximal branch occlusion or significant proximal stenosis. No aneurysm is identified. Posterior circulation: The intracranial right vertebral artery is occluded with minimal retrograde opacification distally. The intracranial left vertebral artery is patent and supplies the basilar. There is a patent left PICA. A right PICA is not identified. The basilar artery is widely patent. Posterior communicating arteries are diminutive or absent. Both PCAs are patent without evidence of significant proximal stenosis. No aneurysm is identified. Venous sinuses: Patent. Anatomic variants: None. Review of the MIP images confirms the above findings IMPRESSION: 1. 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 2. Mild left vertebral artery origin stenosis. 3. No significant anterior intracranial arterial stenosis or cervical carotid artery stenosis. 4. Aortic Atherosclerosis (ICD10-I70.0) and Emphysema (ICD10-J43.9). Electronically Signed   By: Logan Bores M.D.   On: 11/03/2019 21:40   DG Chest 2 View  Result Date: 11/03/2019 CLINICAL DATA:  Cough and shortness of breath. EXAM: CHEST - 2 VIEW COMPARISON:  Chest radiograph 03/24/2019 FINDINGS: Chronic hyperinflation and bronchial thickening. No focal airspace disease. Unchanged heart size and mediastinal contours. Aortic atherosclerosis. No pulmonary edema. No pleural effusion or pneumothorax. No acute osseous abnormalities are seen. IMPRESSION: Chronic hyperinflation and bronchial thickening,  imaging findings consistent with COPD. No acute abnormality. Aortic Atherosclerosis (ICD10-I70.0). Electronically Signed   By: Keith Rake  M.D.   On: 11/03/2019 15:21   CT HEAD WO CONTRAST  Result Date: 11/03/2019 CLINICAL DATA:  Possible stroke; neuro deficits, subacute. Additional history provided: Nasal congestion with headache, generalized weakness for 2 days, throbbing sensation in head. EXAM: CT HEAD WITHOUT CONTRAST TECHNIQUE: Contiguous axial images were obtained from the base of the skull through the vertex without intravenous contrast. COMPARISON:  Head CT 03/24/2019, brain MRI 11/13/2011 FINDINGS: Brain: There is no evidence of acute intracranial hemorrhage, intracranial mass, midline shift or extra-axial fluid collection.No demarcated cortical infarction. Patchy hypodensity within the cerebral white matter is similar as compared to prior head CT 03/24/2019. Findings are nonspecific, but consistent with chronic small vessel ischemic disease. Redemonstrated chronic infarcts in the bilateral cerebellar hemispheres. Cerebral volume is normal for age. Vascular: There is asymmetric hyperdensity of the V4 right vertebral artery (series 3, image 4) (series 6, image 28). Atherosclerotic calcifications. Skull: Normal. Negative for fracture or focal lesion. Sinuses/Orbits: Visualized orbits demonstrate no acute abnormality. Small mucous retention cyst within the inferior right maxillary sinus. Bilateral sphenoid sinus mucosal thickening. Moderate-sized right sphenoid sinus mucous retention cyst. Findings of possible hyperdense intracranial right vertebral artery called by telephone at the time of interpretation on 11/03/2019 at 6:38 pm to provider Veryl Speak , who verbally acknowledged these results. IMPRESSION: Asymmetric hyperdensity of the intracranial right vertebral artery. Given the provided history, consider CTA head/neck to exclude thrombus within this vessel. Otherwise, no evidence of acute intracranial abnormality. Stable chronic small vessel ischemic disease. Redemonstrated chronic infarcts in the bilateral cerebellar hemispheres.  Paranasal sinus disease as described with multiple mucous retention cysts. Electronically Signed   By: Kellie Simmering DO   On: 11/03/2019 18:39   CT Angio Neck W and/or Wo Contrast  Result Date: 11/03/2019 CLINICAL DATA:  Carotid stenosis. Weakness. Abnormal hyperdense appearance of the distal right vertebral artery on earlier noncontrast head CT. EXAM: CT ANGIOGRAPHY HEAD AND NECK TECHNIQUE: Multidetector CT imaging of the head and neck was performed using the standard protocol during bolus administration of intravenous contrast. Multiplanar CT image reconstructions and MIPs were obtained to evaluate the vascular anatomy. Carotid stenosis measurements (when applicable) are obtained utilizing NASCET criteria, using the distal internal carotid diameter as the denominator. CONTRAST:  133mL OMNIPAQUE IOHEXOL 350 MG/ML SOLN COMPARISON:  None. FINDINGS: CTA NECK FINDINGS Aortic arch: Standard 3 vessel aortic arch with mild atherosclerotic plaque. Widely patent arch vessel origins. Right carotid system: Patent with moderate volume calcified plaque at the carotid bifurcation. No evidence of significant stenosis or dissection. Left carotid system: Patent without evidence of stenosis or dissection. Vertebral arteries: There is severe stenosis of the proximal right V1 segment, and there is occlusion of the V2 and V3 segments beginning at the C6 level without distal reconstitution. The left vertebral artery is patent with calcified plaque at its origin resulting in mild stenosis. Skeleton: No acute osseous abnormality or suspicious osseous lesion. Other neck: No evidence of cervical lymphadenopathy or mass. Upper chest: Mild centrilobular and paraseptal emphysema. Review of the MIP images confirms the above findings CTA HEAD FINDINGS Anterior circulation: The internal carotid arteries are patent from skull base to carotid termini with mild calcified plaque bilaterally not resulting in significant stenosis. ACAs and MCAs are  patent without evidence of proximal branch occlusion or significant proximal stenosis. No aneurysm is identified. Posterior circulation: The intracranial right vertebral artery is occluded with  minimal retrograde opacification distally. The intracranial left vertebral artery is patent and supplies the basilar. There is a patent left PICA. A right PICA is not identified. The basilar artery is widely patent. Posterior communicating arteries are diminutive or absent. Both PCAs are patent without evidence of significant proximal stenosis. No aneurysm is identified. Venous sinuses: Patent. Anatomic variants: None. Review of the MIP images confirms the above findings IMPRESSION: 1. 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 2. Mild left vertebral artery origin stenosis. 3. No significant anterior intracranial arterial stenosis or cervical carotid artery stenosis. 4. Aortic Atherosclerosis (ICD10-I70.0) and Emphysema (ICD10-J43.9). Electronically Signed   By: Logan Bores M.D.   On: 11/03/2019 21:40    Gifford Shave, MD 11/04/2019, 5:33 AM PGY-1, [place] Intern pager: [phone], text pages welcome

## 2019-11-04 NOTE — Progress Notes (Signed)
STROKE TEAM PROGRESS NOTE   INTERVAL HISTORY I personally reviewed history of presenting illness with the patient, electronic medical records and imaging films in PACS.  She presented with several days history of nausea, dizziness, gait ataxia and severe neck and occipital pain.  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  Echocardiogram is pending.  LDL cholesterol is 147 mg percent and hemoglobin A1c is 5.7.  Vitals:   11/04/19 0600 11/04/19 0700 11/04/19 0800 11/04/19 0824  BP: 137/81 (!) 152/93 (!) 141/68   Pulse: 86 93 92   Resp: 17 16 (!) 22   Temp:      TempSrc:      SpO2: 99% 100% 100% 100%    CBC:  Recent Labs  Lab 11/03/19 1612 11/03/19 1633  WBC 8.1  --   NEUTROABS 5.0  --   HGB 12.4 13.6  HCT 39.4 40.0  MCV 95.9  --   PLT 276  --     Basic Metabolic Panel:  Recent Labs  Lab 11/03/19 1612 11/03/19 1633  NA 140 141  K 3.6 3.6  CL 105 105  CO2 24  --   GLUCOSE 116* 111*  BUN 5* 7*  CREATININE 0.95 0.80  CALCIUM 9.1  --    Lipid Panel:     Component Value Date/Time   CHOL 202 (H) 11/04/2019 0249   TRIG 93 11/04/2019 0249   HDL 37 (L) 11/04/2019 0249   CHOLHDL 5.5 11/04/2019 0249   VLDL 19 11/04/2019 0249   LDLCALC 146 (H) 11/04/2019 0249   HgbA1c:  Lab Results  Component Value Date   HGBA1C 5.7 (H) 11/04/2019   Urine Drug Screen: No results found for: LABOPIA, COCAINSCRNUR, LABBENZ, AMPHETMU, THCU, LABBARB  Alcohol Level No results found for: ETH  IMAGING past 24 hours CT Angio Head W or Wo Contrast  Result Date: 11/03/2019 CLINICAL DATA:  Carotid stenosis. Weakness. Abnormal hyperdense appearance of the distal right vertebral artery on earlier noncontrast head CT. EXAM: CT ANGIOGRAPHY HEAD AND NECK TECHNIQUE: Multidetector CT imaging of the head and neck was performed using the standard protocol during bolus administration of intravenous contrast.  Multiplanar CT image reconstructions and MIPs were obtained to evaluate the vascular anatomy. Carotid stenosis measurements (when applicable) are obtained utilizing NASCET criteria, using the distal internal carotid diameter as the denominator. CONTRAST:  168mL OMNIPAQUE IOHEXOL 350 MG/ML SOLN COMPARISON:  None. FINDINGS: CTA NECK FINDINGS Aortic arch: Standard 3 vessel aortic arch with mild atherosclerotic plaque. Widely patent arch vessel origins. Right carotid system: Patent with moderate volume calcified plaque at the carotid bifurcation. No evidence of significant stenosis or dissection. Left carotid system: Patent without evidence of stenosis or dissection. Vertebral arteries: There is severe stenosis of the proximal right V1 segment, and there is occlusion of the V2 and V3 segments beginning at the C6 level without distal reconstitution. The left vertebral artery is patent with calcified plaque at its origin resulting in mild stenosis. Skeleton: No acute osseous abnormality or suspicious osseous lesion. Other neck: No evidence of cervical lymphadenopathy or mass. Upper chest: Mild centrilobular and paraseptal emphysema. Review of the MIP images confirms the above findings CTA HEAD FINDINGS Anterior circulation: The internal carotid arteries are patent from skull base to carotid termini with mild calcified plaque bilaterally not resulting in significant stenosis. ACAs and MCAs are patent without evidence of proximal branch occlusion or significant proximal stenosis. No aneurysm is identified. Posterior  circulation: The intracranial right vertebral artery is occluded with minimal retrograde opacification distally. The intracranial left vertebral artery is patent and supplies the basilar. There is a patent left PICA. A right PICA is not identified. The basilar artery is widely patent. Posterior communicating arteries are diminutive or absent. Both PCAs are patent without evidence of significant proximal  stenosis. No aneurysm is identified. Venous sinuses: Patent. Anatomic variants: None. Review of the MIP images confirms the above findings IMPRESSION: 1. 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 2. Mild left vertebral artery origin stenosis. 3. No significant anterior intracranial arterial stenosis or cervical carotid artery stenosis. 4. Aortic Atherosclerosis (ICD10-I70.0) and Emphysema (ICD10-J43.9). Electronically Signed   By: Logan Bores M.D.   On: 11/03/2019 21:40   DG Chest 2 View  Result Date: 11/03/2019 CLINICAL DATA:  Cough and shortness of breath. EXAM: CHEST - 2 VIEW COMPARISON:  Chest radiograph 03/24/2019 FINDINGS: Chronic hyperinflation and bronchial thickening. No focal airspace disease. Unchanged heart size and mediastinal contours. Aortic atherosclerosis. No pulmonary edema. No pleural effusion or pneumothorax. No acute osseous abnormalities are seen. IMPRESSION: Chronic hyperinflation and bronchial thickening, imaging findings consistent with COPD. No acute abnormality. Aortic Atherosclerosis (ICD10-I70.0). Electronically Signed   By: Keith Rake M.D.   On: 11/03/2019 15:21   CT HEAD WO CONTRAST  Result Date: 11/03/2019 CLINICAL DATA:  Possible stroke; neuro deficits, subacute. Additional history provided: Nasal congestion with headache, generalized weakness for 2 days, throbbing sensation in head. EXAM: CT HEAD WITHOUT CONTRAST TECHNIQUE: Contiguous axial images were obtained from the base of the skull through the vertex without intravenous contrast. COMPARISON:  Head CT 03/24/2019, brain MRI 11/13/2011 FINDINGS: Brain: There is no evidence of acute intracranial hemorrhage, intracranial mass, midline shift or extra-axial fluid collection.No demarcated cortical infarction. Patchy hypodensity within the cerebral white matter is similar as compared to prior head CT 03/24/2019. Findings are nonspecific, but  consistent with chronic small vessel ischemic disease. Redemonstrated chronic infarcts in the bilateral cerebellar hemispheres. Cerebral volume is normal for age. Vascular: There is asymmetric hyperdensity of the V4 right vertebral artery (series 3, image 4) (series 6, image 28). Atherosclerotic calcifications. Skull: Normal. Negative for fracture or focal lesion. Sinuses/Orbits: Visualized orbits demonstrate no acute abnormality. Small mucous retention cyst within the inferior right maxillary sinus. Bilateral sphenoid sinus mucosal thickening. Moderate-sized right sphenoid sinus mucous retention cyst. Findings of possible hyperdense intracranial right vertebral artery called by telephone at the time of interpretation on 11/03/2019 at 6:38 pm to provider Veryl Speak , who verbally acknowledged these results. IMPRESSION: Asymmetric hyperdensity of the intracranial right vertebral artery. Given the provided history, consider CTA head/neck to exclude thrombus within this vessel. Otherwise, no evidence of acute intracranial abnormality. Stable chronic small vessel ischemic disease. Redemonstrated chronic infarcts in the bilateral cerebellar hemispheres. Paranasal sinus disease as described with multiple mucous retention cysts. Electronically Signed   By: Kellie Simmering DO   On: 11/03/2019 18:39   CT Angio Neck W and/or Wo Contrast  Result Date: 11/03/2019 CLINICAL DATA:  Carotid stenosis. Weakness. Abnormal hyperdense appearance of the distal right vertebral artery on earlier noncontrast head CT. EXAM: CT ANGIOGRAPHY HEAD AND NECK TECHNIQUE: Multidetector CT imaging of the head and neck was performed using the standard protocol during bolus administration of intravenous contrast. Multiplanar CT image reconstructions and MIPs were obtained to evaluate the vascular anatomy. Carotid stenosis measurements (when applicable) are obtained utilizing NASCET criteria, using the  distal internal carotid diameter as the denominator.  CONTRAST:  124mL OMNIPAQUE IOHEXOL 350 MG/ML SOLN COMPARISON:  None. FINDINGS: CTA NECK FINDINGS Aortic arch: Standard 3 vessel aortic arch with mild atherosclerotic plaque. Widely patent arch vessel origins. Right carotid system: Patent with moderate volume calcified plaque at the carotid bifurcation. No evidence of significant stenosis or dissection. Left carotid system: Patent without evidence of stenosis or dissection. Vertebral arteries: There is severe stenosis of the proximal right V1 segment, and there is occlusion of the V2 and V3 segments beginning at the C6 level without distal reconstitution. The left vertebral artery is patent with calcified plaque at its origin resulting in mild stenosis. Skeleton: No acute osseous abnormality or suspicious osseous lesion. Other neck: No evidence of cervical lymphadenopathy or mass. Upper chest: Mild centrilobular and paraseptal emphysema. Review of the MIP images confirms the above findings CTA HEAD FINDINGS Anterior circulation: The internal carotid arteries are patent from skull base to carotid termini with mild calcified plaque bilaterally not resulting in significant stenosis. ACAs and MCAs are patent without evidence of proximal branch occlusion or significant proximal stenosis. No aneurysm is identified. Posterior circulation: The intracranial right vertebral artery is occluded with minimal retrograde opacification distally. The intracranial left vertebral artery is patent and supplies the basilar. There is a patent left PICA. A right PICA is not identified. The basilar artery is widely patent. Posterior communicating arteries are diminutive or absent. Both PCAs are patent without evidence of significant proximal stenosis. No aneurysm is identified. Venous sinuses: Patent. Anatomic variants: None. Review of the MIP images confirms the above findings IMPRESSION: 1. 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 2. Mild left vertebral artery origin stenosis. 3. No significant anterior intracranial arterial stenosis or cervical carotid artery stenosis. 4. Aortic Atherosclerosis (ICD10-I70.0) and Emphysema (ICD10-J43.9). Electronically Signed   By: Logan Bores M.D.   On: 11/03/2019 21:40    PHYSICAL EXAM Pleasant middle-aged African-American lady not in distress. . Afebrile. Head is nontraumatic. Neck is supple without bruit.    Cardiac exam no murmur or gallop. Lungs are clear to auscultation. Distal pulses are well felt. Neurological Exam ;  Awake  Alert oriented x 3. Normal speech and language.eye movements full without nystagmus but psychiatric dysmetria on right gaze.fundi were not visualized. Vision acuity and fields appear normal. Hearing is normal. Palatal movements are normal. Face symmetric. Tongue midline. Normal strength, tone, reflexes and coordination except mild right finger-to-nose dysmetria.. Normal sensation. Gait deferred.  ASSESSMENT/PLAN Ms. [name] is a 69 y.o. female with history of tobacco abuse, COPD, HA and chronic back pain presenting with dizziness and difficulty walking with nausea and vomiting. No hx trauma or other damage to neck. Doubt dissection or single area stenosis.   Stroke:   Punctate R medullary and Cerebellar infarcts, embolic secondary to unknown source  CT head No acute abnormality. asymmetric hyperdensity R VA. Stable Small vessel disease. Sinus dz.   CTA head & neck R VA V2 V4 segment w/ possible thrombus. Mild L VA origin stenosis. Aortic atherosclerosis. Emphysema.  MRI  Punctate R medullary and R Cerebellar infarcts. 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 R parotid cystic lesion stable.   2D Echo EF 60-65%. No source of embolus   Recommend loop recorder placement to look for atrial fibrillation as source of stroke. Can be done as an OP if is not in the hospital on Monday. Contact cardiology for insertion  LDL  146  HgbA1c 5.7  HIV neg  UDS pending   Lovenox 40 mg sq daily for VTE prophylaxis  No antithrombotic prior to admission, now on aspirin 81 mg daily and clopidogrel 75 mg daily. Continue DAPT x 3 months then aspirin alone  Therapy recommendations:  pending   Disposition:  pending   Hypertension  Elevated on arrival 179/104 . BP at goal today . Permissive hypertension (OK if < 220/120) but gradually normalize in 5-7 days . Long-term BP goal normotensive  Hyperlipidemia  Home meds:  lipitor 80, resumed in hospital  LDL 146, goal < 70  Continue statin at discharge  Other Stroke Risk Factors  Advanced age  Cigarette smoker, states she is going to quit now. Would like nicotine patch.  Hx ETOH use, advised to drink no more than 1 drink(s) a day  Current marijuana use. Substance abuse - UDS not done.   Other Active Problems  COPD, acute on chronic exacerbation  Maxillary retention cyst   Hospital day # 1 She presented with several days history of nausea, dizziness and gait ataxia and MRI scan shows bilateral cerebellar and right medullary infarcts likely due to terminal right vertebral artery occlusion from large vessel disease.  Recommend dual antiplatelet therapy of aspirin and Plavix for 3 months and check echocardiogram.  She will benefit with loop recorder insertion for monitoring for paroxysmal A. fib.  Aggressive risk factor modification.  Physical occupational therapy and rehab consults.  Greater than 50% time during this 35-minute visit was spent on counseling and coordination of care and discussion with care team. Antony Contras, MD  To contact Stroke Continuity provider, please refer to [email or web address] After hours, contact General Neurology

## 2019-11-04 NOTE — Progress Notes (Signed)
FPTS Interim Progress Note  Nurse reports that stroke swallow screen was completed and passed.  Will order diet.  Onelia Cadmus, Bernita Raisin, DO 11/04/2019, 7:59 AM PGY-2, [facility] pager [phone]

## 2019-11-04 NOTE — ED Notes (Signed)
MS  Have pt call granddaughter Loma Sousa [phone]

## 2019-11-04 NOTE — Evaluation (Signed)
Occupational Therapy Evaluation Patient Details Name: [name] MRN: [id number] DOB: 02/11/1951 Today's Date: 11/04/2019    History of Present Illness [name] is a 69 y.o. 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 PMH is significant for HTN, PAD (s/p bilateral stent placement in 2018 & 2019), COPD, GERD, HLD and Tobacco use.   Clinical Impression   PTA pt independent with BADL/IADL. At time of eval, pt able to complete bed mobility at mod I level and transfers at min guard. Pt mildly unsteady with sit <> stands requiring close guard for safety. Upon completing functional mobility beyond household level, pt requiring up to min A to maintain balance on the L. This was more apparent with higher level balance challenges (head turns, etc.). Pt presents mildly ataxic in BUEs that improved with repetition. Suspect some mild visual deficits, but will need further assessment due to poor pt reporting and ability to follow visual assessment directions. Given current status, anticipate pt to progress without OT f/u. Will continue to follow while acute per POC listed below.    Follow Up Recommendations  No OT follow up;Supervision - Intermittent    Equipment Recommendations  3 in 1 bedside commode    Recommendations for Other Services       Precautions / Restrictions Precautions Precautions: Fall Restrictions Weight Bearing Restrictions: No      Mobility Bed Mobility Overal bed mobility: Modified Independent                Transfers Overall transfer level: Needs assistance Equipment used: 1 person hand held assist Transfers: Sit to/from Stand Sit to Stand: Min guard         General transfer comment: min guard for safety    Balance Overall balance assessment: Needs assistance Sitting-balance support: No upper extremity supported;Feet unsupported Sitting balance-Leahy Scale:  Good     Standing balance support: Single extremity supported;During functional activity Standing balance-Leahy Scale: Poor(to fair) Standing balance comment: requires intermittent support to maintain balance with functional mobility                           ADL either performed or assessed with clinical judgement   ADL Overall ADL's : Needs assistance/impaired Eating/Feeding: Independent   Grooming: Supervision/safety;Standing   Upper Body Bathing: Set up;Sitting   Lower Body Bathing: Supervison/ safety;Sit to/from stand;Sitting/lateral leans   Upper Body Dressing : Supervision/safety;Sitting   Lower Body Dressing: Supervision/safety;Sitting/lateral leans;Sit to/from stand Lower Body Dressing Details (indicate cue type and reason): to don socks Toilet Transfer: Min guard;Minimal assistance Toilet Transfer Details (indicate cue type and reason): some LOB with further functional mobility Toileting- Clothing Manipulation and Hygiene: Set up   Tub/ Shower Transfer: Supervision/safety;Shower seat   Functional mobility during ADLs: Min guard;Minimal assistance General ADL Comments: pt mostly limited by balance deficits     Vision Baseline Vision/History: Wears glasses Wears Glasses: At all times;Reading only Patient Visual Report: Blurring of vision Additional Comments: appears to have slight visual deficits in R sided visual field but difficulty maintaining attention and following directions of assessment for accurate completion     Perception     Praxis      Pertinent Vitals/Pain Pain Assessment: No/denies pain     Hand Dominance     Extremity/Trunk Assessment Upper Extremity Assessment Upper Extremity Assessment: Overall WFL for tasks assessed   Lower Extremity Assessment Lower Extremity Assessment:  Defer to PT evaluation       Communication     Cognition Arousal/Alertness: Awake/alert Behavior During Therapy: [facility] for tasks  assessed/performed Overall Cognitive Status: Impaired/Different from baseline Area of Impairment: Safety/judgement;Attention                   Current Attention Level: Sustained     Safety/Judgement: Decreased awareness of deficits;Decreased awareness of safety     General Comments: difficulty maintaining attention with visual assessment, poor overall awareness of deficits   General Comments       Exercises     Shoulder Instructions      Home Living                                          Prior Functioning/Environment                   OT Problem List: Decreased strength;Impaired vision/perception;Decreased knowledge of use of DME or AE;Decreased activity tolerance;Decreased cognition;Impaired balance (sitting and/or standing);Decreased coordination      OT Treatment/Interventions: Self-care/ADL training;Visual/perceptual remediation/compensation;Therapeutic exercise;Patient/family education;Balance training;Neuromuscular education;Energy conservation;Therapeutic activities;DME and/or AE instruction;Cognitive remediation/compensation    OT Goals(Current goals can be found in the care plan section) Acute Rehab OT Goals Patient Stated Goal: return to independence OT Goal Formulation: With patient Time For Goal Achievement: 11/18/19 Potential to Achieve Goals: Good  OT Frequency: Min 2X/week   Barriers to D/C:            Co-evaluation PT/OT/SLP Co-Evaluation/Treatment: Yes Reason for Co-Treatment: For patient/therapist safety;To address functional/ADL transfers   OT goals addressed during session: ADL's and self-care;Proper use of Adaptive equipment and DME;Strengthening/ROM      AM-PAC OT "6 Clicks" Daily Activity     Outcome Measure Help from another person eating meals?: None Help from another person taking care of personal grooming?: None Help from another person toileting, which includes using toliet, bedpan, or urinal?: A  Little Help from another person bathing (including washing, rinsing, drying)?: A Little Help from another person to put on and taking off regular upper body clothing?: None Help from another person to put on and taking off regular lower body clothing?: None 6 Click Score: 22   End of Session Nurse Communication: Mobility status  Activity Tolerance: Patient tolerated treatment well Patient left: in bed;with call bell/phone within reach;with bed alarm set  OT Visit Diagnosis: Unsteadiness on feet (R26.81);Other symptoms and signs involving the nervous system (R29.898)                Time: [id number] OT Time Calculation (min): 21 min Charges:  OT General Charges $OT Visit: 1 Visit OT Evaluation $OT Eval Moderate Complexity: Arnold, MSOT, OTR/L Acute Rehabilitation Services [facility] Office Number: [phone] Pager: [phone]    Zenovia Jarred 11/04/2019, 5:04 PM

## 2019-11-04 NOTE — Progress Notes (Signed)
11/04/19 1713  PT Visit Information  Last PT Received On 11/04/19  Assistance Needed +1  PT/OT/SLP Co-Evaluation/Treatment Yes  Reason for Co-Treatment To address functional/ADL transfers  PT goals addressed during session Mobility/safety with mobility;Balance  History of Present Illness [name] is a 69 y.o. 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 PMH is significant for HTN, PAD (s/p bilateral stent placement in 2018 & 2019), COPD, GERD, HLD and Tobacco use.  Precautions  Precautions Fall  Restrictions  Weight Bearing Restrictions No  Home Living  Family/patient expects to be discharged to: Private residence  Living Arrangements Other (Comment) (sister)  Available Help at Discharge Available 24 hours/day  Type of [place] to enter  Entrance Stairs-Number of Steps 4  Entrance Stairs-Rails Can reach both  Home Layout One level  Bathroom Therapist, music None  Prior Function  Level of Independence Independent  Communication  Communication No difficulties  Pain Assessment  Pain Assessment No/denies pain  Cognition  Arousal/Alertness Awake/alert  Behavior During Therapy WFL for tasks assessed/performed  Overall Cognitive Status Impaired/Different from baseline  Area of Impairment Safety/judgement;Attention  Current Attention Level Sustained  Safety/Judgement Decreased awareness of deficits;Decreased awareness of safety  General Comments difficulty maintaining attention with visual assessment, poor overall awareness of deficits  Upper Extremity Assessment  Upper Extremity Assessment Defer to OT evaluation  Lower Extremity Assessment  Lower Extremity Assessment RLE deficits/detail;Generalized weakness  RLE Deficits / Details Mild ataxia noted during heel shin test  Cervical / Trunk Assessment  Cervical  / Trunk Assessment Normal  Bed Mobility  Overal bed mobility Modified Independent  Transfers  Overall transfer level Needs assistance  Equipment used 1 person hand held assist  Transfers Sit to/from Stand  Sit to Stand Min guard  General transfer comment min guard for safety  Ambulation/Gait  Ambulation/Gait assistance Min guard;Min assist  Gait Distance (Feet) 100 Feet  Assistive device 1 person hand held assist  Gait Pattern/deviations Step-through pattern;Decreased stride length;Drifts right/left  General Gait Details Mild unsteadiness noted, especially with horizontal/vertical head turns. Min to min guard A for steadying. Discussed possible use of cane-will attempt during next session.   Gait velocity Decreased  Modified Rankin (Stroke Patients Only)  Pre-Morbid Rankin Score 0  Modified Rankin 4  Balance  Overall balance assessment Needs assistance  Sitting-balance support No upper extremity supported;Feet unsupported  Sitting balance-Leahy Scale Good  Standing balance support Single extremity supported;During functional activity  Standing balance-Leahy Scale Poor  Standing balance comment requires intermittent support to maintain balance with functional mobility  PT - End of Session  Equipment Utilized During Treatment Gait belt  Activity Tolerance Patient tolerated treatment well  Patient left in bed;with call bell/phone within reach  Nurse Communication Mobility status  PT Assessment  PT Recommendation/Assessment Patient needs continued PT services  PT Visit Diagnosis Unsteadiness on feet (R26.81);Muscle weakness (generalized) (M62.81)  PT Problem List Decreased strength;Decreased balance;Decreased mobility;Decreased cognition;Decreased knowledge of precautions  PT Plan  PT Frequency (ACUTE ONLY) Min 4X/week  PT Treatment/Interventions (ACUTE ONLY) Gait training;Stair training;DME instruction;Therapeutic activities;Functional mobility training;Therapeutic exercise;Balance  training;Patient/family education  AM-PAC PT "6 Clicks" Mobility Outcome Measure (Version 2)  Help needed turning from your back to your side while in a flat bed without using bedrails? 4  Help needed moving from lying on your back to sitting on the side of  a flat bed without using bedrails? 4  Help needed moving to and from a bed to a chair (including a wheelchair)? 3  Help needed standing up from a chair using your arms (e.g., wheelchair or bedside chair)? 3  Help needed to walk in hospital room? 3  Help needed climbing 3-5 steps with a railing?  2  6 Click Score 19  Consider Recommendation of Discharge To: Home with [facility]  PT Recommendation  Follow Up Recommendations Outpatient PT;Supervision for mobility/OOB  PT equipment Other (comment) (TBD; possibly a cane?)  Individuals Consulted  Consulted and Agree with Results and Recommendations Patient  Acute Rehab PT Goals  Patient Stated Goal return to independence  PT Goal Formulation With patient  Time For Goal Achievement 11/18/19  Potential to Achieve Goals Good  PT Time Calculation  PT Start Time (ACUTE ONLY) 1352  PT Stop Time (ACUTE ONLY) 1413  PT Time Calculation (min) (ACUTE ONLY) 21 min  PT General Charges  $$ ACUTE PT VISIT 1 Visit  PT Evaluation  $PT Eval Moderate Complexity 1 Mod  Written Expression  Dominant Hand Right   Pt admitted secondary to problem above with deficits below. Pt with mild unsteadiness, requiring HHA and min to min guard A. Discussed possible use of cane; will need to practice during next session. Reports sister can assist as needed. Feel pt would benefit from outpatient PT at d/c to address balance deficits. Will continue to follow acutely to maximize functional mobility independence and safety.   Reuel Derby, PT, DPT  Acute Rehabilitation Services  Pager: [phone] Office: [phone]

## 2019-11-05 ENCOUNTER — Other Ambulatory Visit: Payer: Self-pay

## 2019-11-05 ENCOUNTER — Emergency Department (HOSPITAL_COMMUNITY)
Admission: EM | Admit: 2019-11-05 | Discharge: 2019-11-06 | Disposition: A | Discharge status: Home / Self Care | Payer: Medicare HMO | Attending: Emergency Medicine | Admitting: Emergency Medicine

## 2019-11-05 ENCOUNTER — Encounter (HOSPITAL_COMMUNITY): Payer: Self-pay | Admitting: Family Medicine

## 2019-11-05 DIAGNOSIS — F1210 Cannabis abuse, uncomplicated: Secondary | ICD-10-CM | POA: Insufficient documentation

## 2019-11-05 DIAGNOSIS — M542 Cervicalgia: Secondary | ICD-10-CM | POA: Diagnosis not present

## 2019-11-05 DIAGNOSIS — Z79899 Other long term (current) drug therapy: Secondary | ICD-10-CM | POA: Insufficient documentation

## 2019-11-05 DIAGNOSIS — F17210 Nicotine dependence, cigarettes, uncomplicated: Secondary | ICD-10-CM | POA: Insufficient documentation

## 2019-11-05 DIAGNOSIS — Z8673 Personal history of transient ischemic attack (TIA), and cerebral infarction without residual deficits: Secondary | ICD-10-CM | POA: Diagnosis not present

## 2019-11-05 DIAGNOSIS — Z7982 Long term (current) use of aspirin: Secondary | ICD-10-CM | POA: Insufficient documentation

## 2019-11-05 DIAGNOSIS — R202 Paresthesia of skin: Secondary | ICD-10-CM | POA: Diagnosis not present

## 2019-11-05 DIAGNOSIS — J449 Chronic obstructive pulmonary disease, unspecified: Secondary | ICD-10-CM | POA: Diagnosis not present

## 2019-11-05 DIAGNOSIS — R519 Headache, unspecified: Secondary | ICD-10-CM | POA: Diagnosis not present

## 2019-11-05 LAB — DIFFERENTIAL
Abs Immature Granulocytes: 0.02 10*3/uL (ref 0.00–0.07)
Basophils Absolute: 0 10*3/uL (ref 0.0–0.1)
Basophils Relative: 0 %
Eosinophils Absolute: 0 10*3/uL (ref 0.0–0.5)
Eosinophils Relative: 0 %
Immature Granulocytes: 0 %
Lymphocytes Relative: 31 %
Lymphs Abs: 2.5 10*3/uL (ref 0.7–4.0)
Monocytes Absolute: 0.4 10*3/uL (ref 0.1–1.0)
Monocytes Relative: 5 %
Neutro Abs: 5.2 10*3/uL (ref 1.7–7.7)
Neutrophils Relative %: 64 %

## 2019-11-05 LAB — BASIC METABOLIC PANEL
Anion gap: 11 (ref 5–15)
BUN: 13 mg/dL (ref 8–23)
CO2: 22 mmol/L (ref 22–32)
Calcium: 9 mg/dL (ref 8.9–10.3)
Chloride: 106 mmol/L (ref 98–111)
Creatinine, Ser: 1.01 mg/dL — ABNORMAL HIGH (ref 0.44–1.00)
GFR calc Af Amer: >60 mL/min (ref >60)
GFR calc non Af Amer: 58 mL/min — ABNORMAL LOW (ref >60)
Glucose, Bld: 115 mg/dL — ABNORMAL HIGH (ref 70–99)
Potassium: 3.7 mmol/L (ref 3.5–5.1)
Sodium: 139 mmol/L (ref 135–145)

## 2019-11-05 LAB — CBC
HCT: 36.6 % (ref 36.0–46.0)
Hemoglobin: 11.5 g/dL — ABNORMAL LOW (ref 12.0–15.0)
MCH: 29.7 pg (ref 26.0–34.0)
MCHC: 31.4 g/dL (ref 30.0–36.0)
MCV: 94.6 fL (ref 80.0–100.0)
Platelets: 272 10*3/uL (ref 150–400)
RBC: 3.87 MIL/uL (ref 3.87–5.11)
RDW: 14.2 % (ref 11.5–15.5)
WBC: 8.1 10*3/uL (ref 4.0–10.5)
nRBC: 0 % (ref 0.0–0.2)

## 2019-11-05 LAB — I-STAT CHEM 8, ED
BUN: 19 mg/dL (ref 8–23)
Calcium, Ion: 1.15 mmol/L (ref 1.15–1.40)
Chloride: 105 mmol/L (ref 98–111)
Creatinine, Ser: 0.9 mg/dL (ref 0.44–1.00)
Glucose, Bld: 126 mg/dL — ABNORMAL HIGH (ref 70–99)
HCT: 37 % (ref 36.0–46.0)
Hemoglobin: 12.6 g/dL (ref 12.0–15.0)
Potassium: 4 mmol/L (ref 3.5–5.1)
Sodium: 139 mmol/L (ref 135–145)
TCO2: 25 mmol/L (ref 22–32)

## 2019-11-05 LAB — APTT: aPTT: 30 seconds (ref 24–36)

## 2019-11-05 LAB — PROTIME-INR
INR: 0.9 (ref 0.8–1.2)
Prothrombin Time: 12.5 seconds (ref 11.4–15.2)

## 2019-11-05 MED ORDER — ONDANSETRON HCL 4 MG PO TABS
4.0000 mg | ORAL_TABLET | Freq: Once | ORAL | Status: AC
  Administered 2019-11-05: 4 mg via ORAL
  Filled 2019-11-05: qty 1

## 2019-11-05 MED ORDER — PREDNISONE 20 MG PO TABS: 40.0000 mg | ORAL_TABLET | Freq: Every day | ORAL | 0 refills | Status: AC

## 2019-11-05 MED ORDER — CLOPIDOGREL BISULFATE 75 MG PO TABS: 75.0000 mg | ORAL_TABLET | Freq: Every day | ORAL | 2 refills | Status: DC

## 2019-11-05 MED ORDER — AMOXICILLIN-POT CLAVULANATE 875-125 MG PO TABS: 1.0000 | ORAL_TABLET | Freq: Two times a day (BID) | ORAL | 0 refills | Status: AC

## 2019-11-05 NOTE — Progress Notes (Signed)
Physical Therapy Treatment Patient Details Name: [name] MRN: [id number] DOB: 09/16/1950 Today's Date: 11/05/2019    History of Present Illness [name] is a 70 y.o. 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 PMH is significant for HTN, PAD (s/p bilateral stent placement in 2018 & 2019), COPD, GERD, HLD and Tobacco use.    PT Comments    Pt continues to demonstrate mod I for bed mobility and min guard for transfers. Pt was able to complete 4 stairs with min guard and use of 1 rail (R). Pt was able to ambulate ~300 ft with 1-hand assist via PT, she may benefit from use of a cane. Midway through ambulation, pt began coughing and required 3 rest breaks to return to safe gait pattern. SpO2 98% on RA. Higher level balance activities continue to challenge her balance with L head turns resulting in L LOB and min assist to regain steadiness. Discussed potential use of cane next session. Pt would continue to benefit from skilled physical therapy services at this time while admitted and after d/c to address the below listed limitations in order to improve overall safety and independence with functional mobility.   Follow Up Recommendations  Outpatient PT;Supervision for mobility/OOB     Equipment Recommendations  Other (comment)(cane?)    Recommendations for Other Services       Precautions / Restrictions Precautions Precautions: Fall Restrictions Weight Bearing Restrictions: No    Mobility  Bed Mobility Overal bed mobility: Modified Independent                Transfers Overall transfer level: Needs assistance Equipment used: None Transfers: Sit to/from Stand Sit to Stand: Min guard         General transfer comment: min guard for safety  Ambulation/Gait Ambulation/Gait assistance: Min guard;Min assist Gait Distance (Feet): 300 Feet Assistive device: 1 person hand held  assist Gait Pattern/deviations: Step-through pattern;Decreased stride length;Drifts right/left Gait velocity: Decreased   General Gait Details: mild gait deviations w/ horizontal head turns. Min guard-min assist for steadying. Sttempted to simulate cane via 1 hand assist, reinforced potential use of cane for following sessions   Stairs Stairs: Yes Stairs assistance: Min guard Stair Management: One rail Right;Alternating pattern;Step to pattern Number of Stairs: 4 General stair comments: pt knew to use 1 rail for safety both ascending & descending. She initially used alternating pattern but switched (self-selected) to step-to pattern for safety.    Wheelchair Mobility    Modified Rankin (Stroke Patients Only) Modified Rankin (Stroke Patients Only) Pre-Morbid Rankin Score: No symptoms Modified Rankin: Moderately severe disability     Balance Overall balance assessment: Needs assistance Sitting-balance support: No upper extremity supported;Feet supported Sitting balance-Leahy Scale: Good     Standing balance support: Single extremity supported;During functional activity Standing balance-Leahy Scale: Fair Standing balance comment: requires intermittent support to maintain balance with functional mobility             High level balance activites: Head turns High Level Balance Comments: occasional LOB to L when turning head L, no LOB when turning head R.             Cognition Arousal/Alertness: Awake/alert Behavior During Therapy: WFL for tasks assessed/performed Overall Cognitive Status: Within Functional Limits for tasks assessed                     Current Attention Level: Sustained  General Comments: pt understood she her balance was impaired and to rest when needed      Exercises      General Comments General comments (skin integrity, edema, etc.): Pt began coughing and breathing more heavily after ~193ft of ambulation which required  her to take a rest break. SpO2 was 98%. Pt became emotional but was able to continue walking after rest break.       Pertinent Vitals/Pain Pain Assessment: Faces Faces Pain Scale: Hurts a little bit Pain Location: L medial thigh Pain Descriptors / Indicators: Aching;Discomfort    Home Living                      Prior Function            PT Goals (current goals can now be found in the care plan section) Acute Rehab PT Goals PT Goal Formulation: With patient Time For Goal Achievement: 11/18/19 Potential to Achieve Goals: Good Progress towards PT goals: Progressing toward goals    Frequency    Min 4X/week      PT Plan Current plan remains appropriate    Co-evaluation              AM-PAC PT "6 Clicks" Mobility   Outcome Measure  Help needed turning from your back to your side while in a flat bed without using bedrails?: None Help needed moving from lying on your back to sitting on the side of a flat bed without using bedrails?: None Help needed moving to and from a bed to a chair (including a wheelchair)?: A Little Help needed standing up from a chair using your arms (e.g., wheelchair or bedside chair)?: A Little Help needed to walk in hospital room?: A Little Help needed climbing 3-5 steps with a railing? : A Little 6 Click Score: 20    End of Session Equipment Utilized During Treatment: Gait belt Activity Tolerance: Patient tolerated treatment well;Other (comment)(coughing during gait; 3 rest breaks ) Patient left: in bed;with call bell/phone within reach;with bed alarm set Nurse Communication: Mobility status PT Visit Diagnosis: Unsteadiness on feet (R26.81);Muscle weakness (generalized) (M62.81)     Time: [id number] PT Time Calculation (min) (ACUTE ONLY): 27 min  Charges:  $Gait Training: 8-22 mins $Therapeutic Activity: 8-22 mins                    Maykel Reitter, SPT Acute Rehab  [id number]   [name] 11/05/2019, 1:16 PM

## 2019-11-05 NOTE — Progress Notes (Signed)
Occupational Therapy Treatment Patient Details Name: [name] MRN: [id number] DOB: [birth year] Today's Date: 11/05/2019    History of present illness [name] is a 69 y.o. 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 PMH is significant for HTN, PAD (s/p bilateral stent placement in 2018 & 2019), COPD, GERD, HLD and Tobacco use.   OT comments  Pt reports intermittent dizziness.  Mild nystagmus noted with Lt gaze.  Also noted blurriness with reading and decreased speed of reading.  Discussed strategies with her and discussed options for tub seat/DME and recommendation that she sit to shower due to intermittent dizziness.  She verbally agreed.   Follow Up Recommendations  No OT follow up;Supervision - Intermittent    Equipment Recommendations  3 in 1 bedside commode    Recommendations for Other Services      Precautions / Restrictions Precautions Precautions: Fall Restrictions Weight Bearing Restrictions: No       Mobility Bed Mobility Overal bed mobility: Independent                Transfers Overall transfer level: Needs assistance Equipment used: None Transfers: Sit to/from Stand;Stand Pivot Transfers Sit to Stand: Min guard Stand pivot transfers: Min assist            Balance Overall balance assessment: Needs assistance Sitting-balance support: No upper extremity supported;Feet supported Sitting balance-Leahy Scale: Good     Standing balance support: No upper extremity supported;During functional activity Standing balance-Leahy Scale: Fair                             ADL either performed or assessed with clinical judgement   ADL Overall ADL's : Needs assistance/impaired                                   Tub/Shower Transfer Details (indicate cue type and reason): discussed options for tub seat and recommend she sit to shower due to  occasional dizziness with head turns.  She verbalized understanding          Vision   Vision Assessment?: Yes Eye Alignment: Within Functional Limits Ocular Range of Motion: Restricted on the left Tracking/Visual Pursuits: Decreased smoothness of horizontal tracking Visual Fields: No apparent deficits Additional Comments: Pt demonstrates difficulty sustaining Lt gaze and demonstrates mild horizontal nystagmus with Lt gaze.  She reports dizziness with horizontal head turn, and reports blurriness when reading and demonstrates decreased reading speed    Perception     Praxis      Cognition Arousal/Alertness: Awake/alert Behavior During Therapy: WFL for tasks assessed/performed Overall Cognitive Status: Within Functional Limits for tasks assessed                                          Exercises     Shoulder Instructions       General Comments Pt with multiple questions re: loop recorder.  explained rationale for recorder and discussed with her.  Discussed strategies for improving reading and she verbalized understanding.  She reports her niece and sister will be staying with her at discharge    Pertinent Vitals/ Pain       Pain Assessment: No/denies pain  Home Living  Prior Functioning/Environment              Frequency  Min 2X/week        Progress Toward Goals  OT Goals(current goals can now be found in the care plan section)  Progress towards OT goals: Progressing toward goals     Plan Discharge plan remains appropriate    Co-evaluation                 AM-PAC OT "6 Clicks" Daily Activity     Outcome Measure   Help from another person eating meals?: None Help from another person taking care of personal grooming?: None Help from another person toileting, which includes using toliet, bedpan, or urinal?: A Little Help from another person bathing (including washing,  rinsing, drying)?: A Little Help from another person to put on and taking off regular upper body clothing?: A Little Help from another person to put on and taking off regular lower body clothing?: A Little 6 Click Score: 20    End of Session Equipment Utilized During Treatment: Gait belt  OT Visit Diagnosis: Unsteadiness on feet (R26.81);Other symptoms and signs involving the nervous system (R29.898)   Activity Tolerance Patient tolerated treatment well   Patient Left in bed;with call bell/phone within reach   Nurse Communication Mobility status        Time: [id number] OT Time Calculation (min): 33 min  Charges: OT General Charges $OT Visit: 1 Visit OT Treatments $Self Care/Home Management : 8-22 mins $Therapeutic Activity: 8-22 mins  Nilsa Nutting., OTR/L Acute Rehabilitation Services Pager [phone] Office [place], [place] 11/05/2019, 5:15 PM

## 2019-11-05 NOTE — ED Triage Notes (Addendum)
per pt she was just here a few hr ago with a discharge from hospital with same complaints, left arm weakness and pain and left sided headache. No blurred vision, some tingling and numbness in her left hand.No slurred speech. All grips equal.

## 2019-11-05 NOTE — Discharge Summary (Signed)
[facility] Discharge Summary  Patient name: [name] Medical record number: [id number] Date of birth: [birth year] Age: 69 y.o. Gender: female Date of Admission: 11/03/2019  Date of Discharge: 11/05/2019 Admitting Physician: Stark Klein, MD  Primary Care Provider: [name], DO Consultants: Neurology  Indication for Hospitalization: CVA  Discharge Diagnoses/Problem List:  Right vertebral artery thrombus with CVA COPD Hypertension Peripheral vascular disease Hyperlipidemia Maxillary retention cyst  Disposition: Home  Discharge Condition: Stable  Discharge Exam:  Temp:  [97.8 F (36.6 C)-98.3 F (36.8 C)] 98.3 F (36.8 C) (03/06 0430) Pulse Rate:  [77-92] 88 (03/06 0430) Resp:  [15-22] 18 (03/06 0430) BP: (127-149)/(57-98) 133/69 (03/06 0430) SpO2:  [97 %-100 %] 99 % (03/06 0749) Physical Exam: General: Resting comfortably, sits up on entering the room Cardiovascular: Regular rate and rhythm, no murmurs appreciated Respiratory: Patient has diffuse inspiratory and expiratory wheezes.  Rhonchi are present bilaterally.  Patient is on room air satting 100% at the time of evaluation Abdomen: Soft, nontender, positive bowel sounds Extremities: No edema noted Neuro: Cranial nerves intact, patient denies any loss of sensation to touch on face.  5 out of 5 strength in bilateral upper and lower extremities.   Brief Hospital Course:  Stroke Patient presented to the emergency room after 4 days of headache and 3 days of weakness in her lower extremities.  CT head showed chronic small vessel ischemic disease with chronic infarcts in bilateral cerebellar hemispheres.  Head neck CT noted occlusion of V2 to V4 segments of the right vertebral artery apparent narrowing suggesting potentially acute thrombus with mid left vertebral artery origin stenosis.  Neurology was consulted and recommended MRI.  MRI showed punctuate right medullary and right  cerebellar infarcts.  Old cerebellar infarcts with small vessel disease.  Right parotid cystic lesion stable.  An echo was done showing EF of 60 to 65% with no source of embolus.  Neurology prescribed the patient a loading dose of Plavix of 100 mg and then recommended daily ASA 81 mg and Plavix 75 mg.  She will continue this for 3 months and then continue aspirin alone.  Neurology also placed recommendations regarding a an implantable loop recorder.  Cardiology was called and they will be contacting the patient in the next week to schedule an appointment for its placement.  COPD exacerbation Patient had no O2 requirement on admission but reported having increased congestion over the past week.  Patient was started on Flovent 110 mg 1 puff daily, Pulmicort 0.25 mg twice daily, Atrovent 0.5 mg nebulized every 4 hours, prednisone 40 mg daily, and Augmentin 875-125 mg daily.  Patient's respiratory symptoms improved.  She required no oxygen therapy throughout her admission.  She was discharged on her medications that were prescribed prior to her admission as well as an additional 3 days of Augmentin and 3 days of prednisone 40 mg daily.  Issues for Follow Up:  1. Loop recorder placement, cardiology will be calling to schedule that 2. Ensure patient has picked up COPD medications as well as Plavix from pharmacy. 3. Neurology follow-up 4. Blood pressure management   Significant Procedures:  3/5 brain MRI  Significant Labs and Imaging:  Recent Labs  Lab 11/03/19 1612 11/03/19 1633  WBC 8.1  --   HGB 12.4 13.6  HCT 39.4 40.0  PLT 276  --    Recent Labs  Lab 11/03/19 1612 11/03/19 1612 11/03/19 1633 11/05/19 0854  NA 140  --  141 139  K 3.6   < >  3.6 3.7  CL 105  --  105 106  CO2 24  --   --  22  GLUCOSE 116*  --  111* 115*  BUN 5*  --  7* 13  CREATININE 0.95  --  0.80 1.01*  CALCIUM 9.1  --   --  9.0  ALKPHOS 84  --   --   --   AST 16  --   --   --   ALT 8  --   --   --   ALBUMIN 4.1   --   --   --    < > = values in this interval not displayed.    Results/Tests Pending at Time of Discharge: None  Discharge Medications:  Allergies as of 11/05/2019   No Known Allergies     Medication List    TAKE these medications   amLODipine 5 MG tablet Commonly known as: NORVASC Take 1 tablet (5 mg total) by mouth daily.   amoxicillin-clavulanate 875-125 MG tablet Commonly known as: AUGMENTIN Take 1 tablet by mouth every 12 (twelve) hours for 3 days. What changed: when to take this   aspirin EC 81 MG tablet Take 81 mg by mouth daily.   atorvastatin 80 MG tablet Commonly known as: LIPITOR TAKE 1 TABLET BY MOUTH ONCE DAILY AT 6 PM What changed: See the new instructions.   clopidogrel 75 MG tablet Commonly known as: Plavix Take 1 tablet (75 mg total) by mouth daily.   predniSONE 20 MG tablet Commonly known as: DELTASONE Take 2 tablets (40 mg total) by mouth daily with breakfast for 3 days. Start taking on: November 06, 2019   Trelegy Ellipta 100-62.5-25 MCG/INH Aepb Generic drug: Fluticasone-Umeclidin-Vilant Inhale 1 puff into the lungs daily.   Ventolin HFA 108 (90 Base) MCG/ACT inhaler Generic drug: albuterol INHALE 2 PUFFS BY MOUTH EVERY 4 HOURS AS NEEDED FOR WHEEZING OR SHORTNESS OF BREATH What changed:   how much to take  how to take this  when to take this  reasons to take this  additional instructions            Durable Medical Equipment  (From admission, onward)         Start     Ordered   11/04/19 1748  For home use only DME 3 n 1  Once     11/04/19 1747          Discharge Instructions: Please refer to Patient Instructions section of EMR for full details.  Patient was counseled important signs and symptoms that should prompt return to medical care, changes in medications, dietary instructions, activity restrictions, and follow up appointments.   Follow-Up Appointments: 11/08/2019-Dr. Shan Levans at [facility] family medicine  Gifford Shave,  MD 11/05/2019, 2:51 PM PGY-1, [place]

## 2019-11-05 NOTE — Progress Notes (Signed)
Patient discharged.  Peripheral IV's removed, telemetry discontinued.  AVS education provided with all questions address. Pt discharged to [facility] by wheelchair.

## 2019-11-05 NOTE — Progress Notes (Signed)
STROKE TEAM PROGRESS NOTE   INTERVAL HISTORY Patient is sitting up in bed.  She states she is doing well.  States her headache and neck pain is resolved.  Dizziness is improved.  Vitals:   11/05/19 0007 11/05/19 0430 11/05/19 0749 11/05/19 0902  BP: 128/70 133/69  (!) 149/80  Pulse: 81 88  84  Resp: 18 18  20   Temp: 98 F (36.7 C) 98.3 F (36.8 C)  98.2 F (36.8 C)  TempSrc: Oral Oral  Oral  SpO2: 100% 100% 99% 100%    CBC:  Recent Labs  Lab 11/03/19 1612 11/03/19 1633  WBC 8.1  --   NEUTROABS 5.0  --   HGB 12.4 13.6  HCT 39.4 40.0  MCV 95.9  --   PLT 276  --     Basic Metabolic Panel:  Recent Labs  Lab 11/03/19 1612 11/03/19 1612 11/03/19 1633 11/05/19 0854  NA 140   < > 141 139  K 3.6   < > 3.6 3.7  CL 105   < > 105 106  CO2 24  --   --  22  GLUCOSE 116*   < > 111* 115*  BUN 5*   < > 7* 13  CREATININE 0.95   < > 0.80 1.01*  CALCIUM 9.1  --   --  9.0   < > = values in this interval not displayed.   Lipid Panel:     Component Value Date/Time   CHOL 202 (H) 11/04/2019 0249   TRIG 93 11/04/2019 0249   HDL 37 (L) 11/04/2019 0249   CHOLHDL 5.5 11/04/2019 0249   VLDL 19 11/04/2019 0249   LDLCALC 146 (H) 11/04/2019 0249   HgbA1c:  Lab Results  Component Value Date   HGBA1C 5.7 (H) 11/04/2019   Urine Drug Screen: No results found for: LABOPIA, COCAINSCRNUR, LABBENZ, AMPHETMU, THCU, LABBARB  Alcohol Level No results found for: ETH  IMAGING past 24 hours No results found.  PHYSICAL EXAM Pleasant middle-aged African-American lady not in distress. . Afebrile. Head is nontraumatic. Neck is supple without bruit.    Cardiac exam no murmur or gallop. Lungs are clear to auscultation. Distal pulses are well felt. Neurological Exam ;  Awake  Alert oriented x 3. Normal speech and language.eye movements full without nystagmus but psychiatric dysmetria on right gaze.fundi were not visualized. Vision acuity and fields appear normal. Hearing is normal. Palatal  movements are normal. Face symmetric. Tongue midline. Normal strength, tone, reflexes and coordination except mild right finger-to-nose dysmetria.. Normal sensation. Gait deferred.  ASSESSMENT/PLAN Ms. [name] is a 69 y.o. female with history of tobacco abuse, COPD, HA and chronic back pain presenting with dizziness and difficulty walking with nausea and vomiting. No hx trauma or other damage to neck. Doubt dissection or single area stenosis.   Stroke:   Punctate R medullary and Cerebellar infarcts, embolic secondary to unknown source  CT head No acute abnormality. asymmetric hyperdensity R VA. Stable Small vessel disease. Sinus dz.   CTA head & neck R VA V2 V4 segment w/ possible thrombus. Mild L VA origin stenosis. Aortic atherosclerosis. Emphysema.  MRI  Punctate R medullary and R Cerebellar infarcts. 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 R parotid cystic lesion stable.   2D Echo EF 60-65%. No source of embolus   Recommend loop recorder placement to look for atrial fibrillation as source of stroke. Can be done as an OP if is not in the hospital on Monday. Contact cardiology for  insertion. Will place request for Loop for Monday  LDL 146  HgbA1c 5.7  HIV neg  UDS pending   Lovenox 40 mg sq daily for VTE prophylaxis  No antithrombotic prior to admission, now on aspirin 81 mg daily and clopidogrel 75 mg daily. Continue DAPT x 3 months then aspirin alone  Therapy recommendations:  No OT f/u recommended. PT pending  Disposition:  pending   Hypertension  Elevated on arrival 179/104 . BP at goal today . Permissive hypertension (OK if < 220/120) but gradually normalize in 5-7 days . Long-term BP goal normotensive  Hyperlipidemia  Home meds:  lipitor 80, resumed in hospital  LDL 146, goal < 70  Continue statin at discharge  Other Stroke Risk Factors  Advanced age  Cigarette smoker, states she is going to quit now. Would like nicotine patch.  Hx ETOH  use, advised to drink no more than 1 drink(s) a day  Current marijuana use. Substance abuse - UDS not done.   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  Recommend dual antiplatelet therapy of aspirin and Plavix for 3 months   She will benefit with loop recorder insertion for monitoring for paroxysmal A. fib.  Which can be done as an outpatient.  Aggressive risk factor modification.  Discharge home with home therapies.  Follow-up as an outpatient with stroke clinic in 6 weeks.  Stroke team will sign off.  Kindly call for questions.  Antony Contras, MD     To contact Stroke Continuity provider, please refer to [email or web address] After hours, contact General Neurology

## 2019-11-05 NOTE — Progress Notes (Signed)
PT Progress Note for Charges    11/05/19 1300  PT Visit Information  Last PT Received On 11/05/19  PT General Charges  $$ ACUTE PT VISIT 1 Visit  PT Treatments  $Gait Training 8-22 mins  $Therapeutic Activity 8-22 mins  Anastasio Champion, DPT  Acute Rehabilitation Services Pager [phone] Office [phone]

## 2019-11-05 NOTE — Care Management (Signed)
Spoke to patient re DC plans. She is agreeable to OP PT referral. She declines resources re THC. Discussed that she would need to get 3/1 and cane at pharmacy after DC. She verbalized understanding.

## 2019-11-05 NOTE — Discharge Instructions (Signed)
Thank you for allowing [place] to participate in your care!    You were admitted for a stroke in your cerebellum.  You were also having issues with your COPD and may have been a COPD exacerbation.    You were evaluated by neurology and they recommended taking Plavix 75 mg daily along with aspirin 81 mg daily for 3 months.  After 3 months he will continue aspirin 81 mg daily but stopped taking the Plavix.  Neurology also requested you have a loop recorder placed by cardiology which will monitor your heart for regular rhythms.  Cardiology was called regarding this and they will be contacting you to schedule an appointment for its placement.  Regarding her COPD exacerbation you were given 2 days of antibiotics and 2 days of steroids during your hospital admission.  After discharge I would like for you to continue on the inhalers prescribed prior to your admission which you will need to pick up from the pharmacy.  I also want you to continue the Augmentin (antibiotic) and prednisone (steroid) for an additional 3 days.  You have a hospital follow-up appointment scheduled with Dr. Shan Levans at [facility] family medicine on 11/08/2019 at 9:50 AM.  If you experience worsening of your admission symptoms, develop shortness of breath, life threatening emergency, suicidal or homicidal thoughts you must seek medical attention immediately by calling 911 or calling your MD immediately  if symptoms less severe.    Hospital Discharge After a Stroke  Being discharged from the hospital after a stroke can feel overwhelming. Many things may be different, and it is normal to feel scared or anxious. Some stroke survivors may be able to return to their homes, and others may need more specialized care on a temporary or permanent basis. Your stroke care team will work with you to develop a discharge plan that is best for you. Ask questions if you do not understand something. Invite a friend or family member to participate in discharge  planning. Understanding and following your discharge plan can help to prevent another stroke or other problems. Understanding your medicines After a stroke, your health care provider may prescribe one or more types of medicine. It is important to take medicines exactly as told by your health care provider. Serious harm, such as another stroke, can happen if you are unable to take your medicine exactly as prescribed. Make sure you understand:  What medicine to take.  Why you are taking the medicine.  How and when to take it.  If it can be taken with your other medicines and herbal supplements.  Possible side effects.  When to call your health care provider if you have any side effects.  How you will get and pay for your medicines. Medical assistance programs may be able to help you pay for prescription medicines if you cannot afford them. If you are taking an anticoagulant, be sure to take it exactly as told by your health care provider. This type of medicine can increase the risk of bleeding because it works to prevent blood from clotting. You may need to take certain precautions to prevent bleeding. You should contact your health care provider if you have:  Bleeding or bruising.  A fall or other injury to your head.  Blood in your urine or stool (feces). Planning for home safety  Take steps to prevent falls, such as installing grab bars or using a shower chair. Ask a friend or family member to get needed things in place before you  go home if possible. A therapist can come to your home to make recommendations for safety equipment. Ask your health care provider if you would benefit from this service or from home care. Getting needed equipment Ask your health care provider for a list of any medical equipment and supplies you will need at home. These may include items such as:  Walkers.  Canes.  Wheelchairs.  Hand-strengthening devices.  Special eating utensils. Medical equipment  can be rented or purchased, depending on your insurance coverage. Check with your insurance company about what is covered. Keeping follow-up visits After a stroke, you will need to follow up regularly with a health care provider. You may also need rehabilitation, which can include physical therapy, occupational therapy, or speech-language therapy. Keeping these appointments is very important to your recovery after a stroke. Be sure to bring your medicine list and discharge papers with you to your appointments. If you need help to keep track of your schedule, use a calendar or appointment reminder. Preventing another stroke Having a stroke puts you at risk for another stroke in the future. Ask your health care provider what actions you can take to lower the risk. These may include:  Increasing how much you exercise.  Making a healthy eating plan.  Quitting smoking.  Managing other health conditions, such as high blood pressure, high cholesterol, or diabetes.  Limiting alcohol use. Knowing the warning signs of a stroke  Make sure you understand the signs of a stroke. Before you leave the hospital, you will receive information outlining the stroke warning signs. Share these with your friends and family members. "BE FAST" is an easy way to remember the main warning signs of a stroke:  B - Balance. Signs are dizziness, sudden trouble walking, or loss of balance.  E - Eyes. Signs are trouble seeing or a sudden change in vision.  F - Face. Signs are sudden weakness or numbness of the face, or the face or eyelid drooping on one side.  A - Arms. Signs are weakness or numbness in an arm. This happens suddenly and usually on one side of the body.  S - Speech. Signs are sudden trouble speaking, slurred speech, or trouble understanding what people say.  T - Time. Time to call emergency services. Write down what time symptoms started. Other signs of stroke may include:  A sudden, severe headache  with no known cause.  Nausea or vomiting.  Seizure. These symptoms may represent a serious problem that is an emergency. Do not wait to see if the symptoms will go away. Get medical help right away. Call your local emergency services (911 in the [place] Do not drive yourself to the hospital. Make note of the time that you had your first symptoms. Your emergency responders or emergency room staff will need to know this information. Summary  Being discharged from the hospital after a stroke can feel overwhelming. It is normal to feel scared or anxious.  Make sure you take medicines exactly as told by your health care provider.  Know the warning signs of a stroke, and get help right way if you have any of these symptoms. "BE FAST" is an easy way to remember the main warning signs of a stroke. This information is not intended to replace advice given to you by your health care provider. Make sure you discuss any questions you have with your health care provider. Document Revised: 05/11/2019 Document Reviewed: 11/21/2016 Elsevier Patient Education  [place].

## 2019-11-05 NOTE — Evaluation (Signed)
Speech Language Pathology Evaluation Patient Details Name: [name] MRN: [id number] DOB: April 11, 1951 Today's Date: 11/05/2019 Time: [id number] SLP Time Calculation (min) (ACUTE ONLY): 15 min  Problem List:  Patient Active Problem List   Diagnosis Date Noted  . CVA (cerebral vascular accident) ([place]) 11/04/2019  . Cephalalgia   . Maxillary sinusitis   . Acute CVA (cerebrovascular accident) ([place]) 11/03/2019  . Acute non-recurrent frontal sinusitis 05/21/2018  . Prediabetes 10/27/2017  . PAD (peripheral artery disease) ([place]). left leg 09/17/2017  . Aortic atherosclerosis ([place]) 09/16/2017  . Emphysema lung ([place]) 09/16/2017  . Uses marijuana 09/16/2017  . 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 Loss of weight   . Lumbar disc herniation   . Leg pain, medial 04/11/2016  . Orthopnea 09/28/2015  . Hypertensive retinopathy of right eye, grade 1 05/03/2014  . Hypertensive retinopathy of left eye, grade 2 05/03/2014  . Dyspnea 08/10/2013  . Protein-calorie malnutrition, severe ([place]) 05/11/2013  . Cyclical vomiting [id number]  . Nausea & vomiting   . Tobacco abuse   . COPD exacerbation ([place]) 12/29/2006  . GASTROESOPHAGEAL REFLUX DISEASE 12/29/2006  . HIATAL HERNIA WITH REFLUX 12/29/2006  . PSORIASIS 12/29/2006   Past Medical History:  Past Medical History:  Diagnosis Date  . Abdominal pain, epigastric 02/23/2013  . Abdominal pain, unspecified site 08/15/2013  . Abnormal neurological exam 04/11/2016  . Asthma   . Cataracts, bilateral 05/03/2014  . Chronic bronchitis ([place])   . Chronic lower back pain   . COPD (chronic obstructive pulmonary disease) ([place])   . Daily headache   . De Quervain's tenosynovitis, left 11/09/2014  . GERD (gastroesophageal reflux disease)   . Hx of colonic polyps   . Nausea & vomiting   . PSORIASIS 12/29/2006   Qualifier: Diagnosis of  By: Jobe Igo MD, Shanon Brow    . Right leg pain 04/02/2016  . Smoker    Past  Surgical History:  Past Surgical History:  Procedure Laterality Date  . ABDOMINAL AORTOGRAM W/LOWER EXTREMITY N/A 10/06/2016   Procedure: Abdominal Aortogram w/Lower Extremity;  Surgeon: Waynetta Sandy, MD;  Location: [facility];  Service: Cardiovascular;  Laterality: N/A;  . ABDOMINAL AORTOGRAM W/LOWER EXTREMITY N/A 02/10/2018   Procedure: ABDOMINAL AORTOGRAM W/LOWER EXTREMITY;  Surgeon: Waynetta Sandy, MD;  Location: [facility];  Service: Cardiovascular;  Laterality: N/A;  . ABDOMINAL HERNIA REPAIR    . APPENDECTOMY    . COLONOSCOPY W/ POLYPECTOMY  02/26/2005  . FEMORAL-POPLITEAL BYPASS GRAFT Left 04/20/2018   Procedure: Left FEMORAL-POPLITEAL ARTERY Bypass Graft using Gore Propaten Vascular Graft;  Surgeon: Waynetta Sandy, MD;  Location: [facility];  Service: Vascular;  Laterality: Left;  . HERNIA REPAIR    . LOWER EXTREMITY ANGIOGRAPHY Bilateral 04/19/2018   Procedure: LOWER EXTREMITY ANGIOGRAPHY;  Surgeon: Waynetta Sandy, MD;  Location: [facility];  Service: Cardiovascular;  Laterality: Bilateral;  . PERIPHERAL VASCULAR ATHERECTOMY Right 10/06/2016   Procedure: Peripheral Vascular Atherectomy;  Surgeon: Waynetta Sandy, MD;  Location: [facility];  Service: Cardiovascular;  Laterality: Right;  . PERIPHERAL VASCULAR INTERVENTION Right 10/06/2016   Procedure: Peripheral Vascular Intervention;  Surgeon: Waynetta Sandy, MD;  Location: [facility];  Service: Cardiovascular;  Laterality: Right;  SFA  . PERIPHERAL VASCULAR INTERVENTION Left 02/10/2018   Procedure: PERIPHERAL VASCULAR INTERVENTION;  Surgeon: Waynetta Sandy, MD;  Location: [facility];  Service: Cardiovascular;  Laterality: Left;  . TONSILLECTOMY  1960s  . UPPER GASTROINTESTINAL ENDOSCOPY  03/22/2010, 02/13/2005  . VAGINAL HYSTERECTOMY     partial   HPI:  69 y.o. female presenting with headache, found to have occlusions in the  right vertebral artery segments V2 to V4 and acute CVA (R cerebellum). PMH is significant for HTN, PAD (s/p bilateral stent placement in 2018 & 2019), COPD, GERD, HLD and Tobacco use.   Assessment / Plan / Recommendation Clinical Impression  Pt presented with overall functional cognitive-communication.  Speech is clear, fluent without dysarthria. She was evaluated with the SLUMs, scoring a 29/30 with mild difficulty spacing numbers on a clock face, but otherwise performing well in areas of memory, orientation, following directions, performing simple mental math.  She demonstrated excellent social pragmatics.  She had mild difficulty with awareness of her physical deficits and how they could impact function.  No deficits that warrant SLP intervention were identified.  Pt agrees; our service will sign off.     SLP Assessment  SLP Recommendation/Assessment: Patient does not need any further Speech Lanaguage Pathology Services SLP Visit Diagnosis: Cognitive communication deficit (R41.841)    Follow Up Recommendations  None    Frequency and Duration           SLP Evaluation Cognition  Overall Cognitive Status: Within Functional Limits for tasks assessed Arousal/Alertness: Awake/alert Orientation Level: Oriented X4 Attention: Selective Selective Attention: Appears intact Memory: Appears intact Awareness: Appears intact Problem Solving: Appears intact Safety/Judgment: Appears intact       Comprehension  Auditory Comprehension Overall Auditory Comprehension: Appears within functional limits for tasks assessed Reading Comprehension Reading Status: Within funtional limits    Expression Expression Primary Mode of Expression: Verbal Verbal Expression Overall Verbal Expression: Appears within functional limits for tasks assessed   Oral / Motor  Oral Motor/Sensory Function Overall Oral Motor/Sensory Function: Within functional limits Motor Speech Overall Motor Speech: Appears within  functional limits for tasks assessed   GO                    Juan Quam Laurice 11/05/2019, 10:07 AM   Estill Bamberg L. Tivis Ringer, [place] Office number [phone] Pager [phone]

## 2019-11-06 LAB — COMPREHENSIVE METABOLIC PANEL
ALT: 9 U/L (ref 0–44)
AST: 15 U/L (ref 15–41)
Albumin: 4 g/dL (ref 3.5–5.0)
Alkaline Phosphatase: 82 U/L (ref 38–126)
Anion gap: 11 (ref 5–15)
BUN: 18 mg/dL (ref 8–23)
CO2: 23 mmol/L (ref 22–32)
Calcium: 9.4 mg/dL (ref 8.9–10.3)
Chloride: 105 mmol/L (ref 98–111)
Creatinine, Ser: 1.08 mg/dL — ABNORMAL HIGH (ref 0.44–1.00)
GFR calc Af Amer: >60 mL/min (ref >60)
GFR calc non Af Amer: 53 mL/min — ABNORMAL LOW (ref >60)
Glucose, Bld: 135 mg/dL — ABNORMAL HIGH (ref 70–99)
Potassium: 4.2 mmol/L (ref 3.5–5.1)
Sodium: 139 mmol/L (ref 135–145)
Total Bilirubin: 0.5 mg/dL (ref 0.3–1.2)
Total Protein: 7.1 g/dL (ref 6.5–8.1)

## 2019-11-06 MED ORDER — TRAMADOL HCL 50 MG PO TABS
50.0000 mg | ORAL_TABLET | Freq: Once | ORAL | Status: AC
  Administered 2019-11-06: 50 mg via ORAL
  Filled 2019-11-06: qty 1

## 2019-11-06 NOTE — ED Provider Notes (Signed)
[facility] EMERGENCY DEPARTMENT Provider Note   CSN: [id number] Arrival date & time: 11/05/19  2256     History Chief Complaint  Patient presents with  . Numbness  . Headache    [name] is a 69 y.o. female.  HPI     This is a 69 year old female with a history of COPD, reflux, CVA with recent admission who presents with neck and head pain and left upper extremity paresthesias.  Patient was just discharged from the hospital yesterday.  She states that she left the hospital with similar symptoms.  She reports left head neck pain.  She also describes pain in her left shoulder and tingling and weakness in the left upper extremity.  She states she has had this before.  Currently she is not having any weakness or paresthesias.  She is only complaining of left neck pain.  She rates it 8 out of 10.  Not improved with Tylenol.  She states "I told him in the hospital I had pain."  She denies any vision changes, speech difficulty, other weakness, numbness, gait disturbance.  Patient reports ongoing cough.  Denies chest pain, shortness of breath, fevers.  Past Medical History:  Diagnosis Date  . Abdominal pain, epigastric 02/23/2013  . Abdominal pain, unspecified site 08/15/2013  . Abnormal neurological exam 04/11/2016  . Asthma   . Cataracts, bilateral 05/03/2014  . Chronic bronchitis ([place])   . Chronic lower back pain   . COPD (chronic obstructive pulmonary disease) ([place])   . Daily headache   . De Quervain's tenosynovitis, left 11/09/2014  . GERD (gastroesophageal reflux disease)   . Hx of colonic polyps   . Nausea & vomiting   . PSORIASIS 12/29/2006   Qualifier: Diagnosis of  By: Jobe Igo MD, Shanon Brow    . Right leg pain 04/02/2016  . Smoker     Patient Active Problem List   Diagnosis Date Noted  . CVA (cerebral vascular accident) ([place]) 11/04/2019  . Cephalalgia   . Maxillary sinusitis   . Acute CVA (cerebrovascular accident) ([place]) 11/03/2019  . Acute non-recurrent  frontal sinusitis 05/21/2018  . Prediabetes 10/27/2017  . PAD (peripheral artery disease) ([place]). left leg 09/17/2017  . Aortic atherosclerosis ([place]) 09/16/2017  . Emphysema lung ([place]) 09/16/2017  . Uses marijuana 09/16/2017  . 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 Loss of weight   . Lumbar disc herniation   . Leg pain, medial 04/11/2016  . Orthopnea 09/28/2015  . Hypertensive retinopathy of right eye, grade 1 05/03/2014  . Hypertensive retinopathy of left eye, grade 2 05/03/2014  . Dyspnea 08/10/2013  . Protein-calorie malnutrition, severe ([place]) 05/11/2013  . Cyclical vomiting [id number]  . Nausea & vomiting   . Tobacco abuse   . COPD exacerbation ([place]) 12/29/2006  . GASTROESOPHAGEAL REFLUX DISEASE 12/29/2006  . HIATAL HERNIA WITH REFLUX 12/29/2006  . PSORIASIS 12/29/2006    Past Surgical History:  Procedure Laterality Date  . ABDOMINAL AORTOGRAM W/LOWER EXTREMITY N/A 10/06/2016   Procedure: Abdominal Aortogram w/Lower Extremity;  Surgeon: Waynetta Sandy, MD;  Location: [facility];  Service: Cardiovascular;  Laterality: N/A;  . ABDOMINAL AORTOGRAM W/LOWER EXTREMITY N/A 02/10/2018   Procedure: ABDOMINAL AORTOGRAM W/LOWER EXTREMITY;  Surgeon: Waynetta Sandy, MD;  Location: [facility];  Service: Cardiovascular;  Laterality: N/A;  . ABDOMINAL HERNIA REPAIR    . APPENDECTOMY    . COLONOSCOPY W/ POLYPECTOMY  02/26/2005  . FEMORAL-POPLITEAL BYPASS  GRAFT Left 04/20/2018   Procedure: Left FEMORAL-POPLITEAL ARTERY Bypass Graft using Gore Propaten Vascular Graft;  Surgeon: Waynetta Sandy, MD;  Location: [facility];  Service: Vascular;  Laterality: Left;  . HERNIA REPAIR    . LOWER EXTREMITY ANGIOGRAPHY Bilateral 04/19/2018   Procedure: LOWER EXTREMITY ANGIOGRAPHY;  Surgeon: Waynetta Sandy, MD;  Location: [facility];  Service: Cardiovascular;  Laterality: Bilateral;  . PERIPHERAL  VASCULAR ATHERECTOMY Right 10/06/2016   Procedure: Peripheral Vascular Atherectomy;  Surgeon: Waynetta Sandy, MD;  Location: [facility];  Service: Cardiovascular;  Laterality: Right;  . PERIPHERAL VASCULAR INTERVENTION Right 10/06/2016   Procedure: Peripheral Vascular Intervention;  Surgeon: Waynetta Sandy, MD;  Location: [facility];  Service: Cardiovascular;  Laterality: Right;  SFA  . PERIPHERAL VASCULAR INTERVENTION Left 02/10/2018   Procedure: PERIPHERAL VASCULAR INTERVENTION;  Surgeon: Waynetta Sandy, MD;  Location: [facility];  Service: Cardiovascular;  Laterality: Left;  . TONSILLECTOMY  1960s  . UPPER GASTROINTESTINAL ENDOSCOPY  03/22/2010, 02/13/2005  . VAGINAL HYSTERECTOMY     partial     OB History   No obstetric history on file.     Family History  Problem Relation Age of Onset  . Cancer Other   . Coronary artery disease Other   . Cancer Mother   . Hypertension Mother   . Heart disease Mother   . Cancer Father   . Hypertension Father   . Cancer Sister   . Hypertension Sister     Social History   Tobacco Use  . Smoking status: Current Every Day Smoker    Packs/day: 0.25    Years: 47.00    Pack years: 11.75    Types: Cigarettes  . Smokeless tobacco: Never Used  . Tobacco comment: 3-4 cigarettes per day  Substance Use Topics  . Alcohol use: Not Currently    Alcohol/week: 0.0 standard drinks    Comment: Quit alcohol in 1992; "never really drank much alcohol"  . Drug use: Yes    Types: Marijuana    Comment: 02/10/2018 "~ twice//wk"    Home Medications Prior to Admission medications   Medication Sig Start Date End Date Taking? Authorizing Provider  amLODipine (NORVASC) 5 MG tablet Take 1 tablet (5 mg total) by mouth daily. 11/03/19  Yes Jaynee Eagles, PA-C  amoxicillin-clavulanate (AUGMENTIN) 875-125 MG tablet Take 1 tablet by mouth every 12 (twelve) hours for 3 days. 11/05/19 11/08/19 Yes Gifford Shave, MD  aspirin  EC 81 MG tablet Take 81 mg by mouth daily.   Yes [provider]  atorvastatin (LIPITOR) 80 MG tablet TAKE 1 TABLET BY MOUTH ONCE DAILY AT 6 PM Patient taking differently: Take 80 mg by mouth daily at 6 PM.  11/16/17  Yes Smiley Houseman, MD  clopidogrel (PLAVIX) 75 MG tablet Take 1 tablet (75 mg total) by mouth daily. 11/05/19  Yes Gifford Shave, MD  Fluticasone-Umeclidin-Vilant (TRELEGY ELLIPTA) 100-62.5-25 MCG/INH AEPB Inhale 1 puff into the lungs daily. 11/03/19 12/03/19 Yes Jaynee Eagles, PA-C  predniSONE (DELTASONE) 20 MG tablet Take 2 tablets (40 mg total) by mouth daily with breakfast for 3 days. 11/06/19 11/09/19 Yes Gifford Shave, MD  VENTOLIN HFA 108 (90 Base) MCG/ACT inhaler INHALE 2 PUFFS BY MOUTH EVERY 4 HOURS AS NEEDED FOR WHEEZING OR SHORTNESS OF BREATH Patient taking differently: Inhale 2 puffs into the lungs every 4 (four) hours as needed for wheezing or shortness of breath.  07/22/19  Yes [name], DO  Allergies    Patient has no known allergies.  Review of Systems   Review of Systems  Constitutional: Negative for fever.  Respiratory: Positive for cough. Negative for shortness of breath.   Cardiovascular: Negative for chest pain.  Gastrointestinal: Negative for abdominal pain, nausea and vomiting.  Genitourinary: Negative for dysuria.  Musculoskeletal: Positive for neck pain.  Neurological: Positive for weakness and numbness.  All other systems reviewed and are negative.   Physical Exam Updated Vital Signs BP (!) 166/88   Pulse 89   Temp 98.7 F (37.1 C) (Oral)   Resp 18   SpO2 99%   Physical Exam Vitals and nursing note reviewed.  Constitutional:      Appearance: She is well-developed. She is not ill-appearing.  HENT:     Head: Normocephalic and atraumatic.  Eyes:     Extraocular Movements: Extraocular movements intact.     Pupils: Pupils are equal, round, and reactive to light.  Neck:     Comments: Normal range of motion, no  meningismus, tenderness palpation over the left sternocleidomastoid, no midline tenderness to palpation, step-off, deformity Cardiovascular:     Rate and Rhythm: Normal rate and regular rhythm.     Heart sounds: Normal heart sounds.  Pulmonary:     Effort: Pulmonary effort is normal. No respiratory distress.     Breath sounds: No wheezing.  Abdominal:     General: Bowel sounds are normal.     Palpations: Abdomen is soft.  Musculoskeletal:     Cervical back: Normal range of motion and neck supple.  Skin:    General: Skin is warm and dry.  Neurological:     Mental Status: She is alert and oriented to person, place, and time.     Comments: Cranial nerves II through XII intact, 5 out of 5 strength in all 4 extremities, no drift, no dysmetria to finger-nose-finger  Psychiatric:        Mood and Affect: Mood normal.     ED Results / Procedures / Treatments   Labs (all labs ordered are listed, but only abnormal results are displayed) Labs Reviewed  CBC - Abnormal; Notable for the following components:      Result Value   Hemoglobin 11.5 (*)    All other components within normal limits  COMPREHENSIVE METABOLIC PANEL - Abnormal; Notable for the following components:   Glucose, Bld 135 (*)    Creatinine, Ser 1.08 (*)    GFR calc non Af Amer 53 (*)    All other components within normal limits  I-STAT CHEM 8, ED - Abnormal; Notable for the following components:   Glucose, Bld 126 (*)    All other components within normal limits  PROTIME-INR  APTT  DIFFERENTIAL  CBG MONITORING, ED    EKG EKG Interpretation  Date/Time:  Saturday November 05 2019 23:29:33 EST Ventricular Rate:  89 PR Interval:  128 QRS Duration: 80 QT Interval:  346 QTC Calculation: 420 R Axis:   60 Text Interpretation: Sinus rhythm with Premature atrial complexes Otherwise normal ECG Confirmed by Thayer Jew [phone]) on 11/06/2019 4:53:10 AM   Radiology MR BRAIN WO CONTRAST  Addendum Date: 11/04/2019     ADDENDUM REPORT: 11/04/2019 12:11 ADDENDUM: 1.3 cm T2 hyperintense/potentially cystic lesion in the deep aspect of the right parotid gland, unchanged from 2013 and considered benign. Electronically Signed   By: Logan Bores M.D.   On: 11/04/2019 12:11   Result Date: 11/04/2019 CLINICAL DATA:  Right neck pain, headache, and vertigo.  Occluded right vertebral artery. EXAM: MRI HEAD WITHOUT CONTRAST TECHNIQUE: Multiplanar, multiecho pulse sequences of the brain and surrounding structures were obtained without intravenous contrast. COMPARISON:  Head CT and CTA 11/03/2019. Head MRI 11/13/2011. FINDINGS: Brain: There are punctate acute infarcts in the dorsal right medulla and inferior right cerebellar hemisphere. Chronic infarcts are also present in the cerebellum, greater on the right than the left. Several clustered chronic microhemorrhages are noted in the superior right occipital lobe. Small T2 hyperintensities in the cerebral white matter bilaterally have progressed from 2013 and are nonspecific but compatible with moderately age advanced chronic small vessel ischemic disease. There is no intracranial mass effect or extra-axial fluid collection. Mild cerebral atrophy is within normal limits for age. A subcentimeter lipoma is again noted in the left quadrigeminal cistern. Vascular: Susceptibility artifact along the V4 segment of the right vertebral artery consistent with thrombus and occlusion as shown on the recent CTA. Skull and upper cervical spine: Unremarkable bone marrow signal. Sinuses/Orbits: Unremarkable orbits. Mucous retention cysts in the right maxillary and right sphenoid sinuses. Small left mastoid effusion. Other: None. IMPRESSION: 1. Punctate acute infarcts in the right medulla and right cerebellum. 2. Chronic cerebellar infarcts and moderate chronic small vessel ischemic disease in the cerebral white matter. Electronically Signed: By: Logan Bores M.D. On: 11/04/2019 11:46   ECHOCARDIOGRAM  COMPLETE  Result Date: 11/04/2019    ECHOCARDIOGRAM REPORT   Patient Name:   CHAMARI STATUM Date of Exam: 11/04/2019 Medical Rec #:  [id number]     Height:       68.0 in Accession #:    [id number]    Weight:       154.3 lb Date of Birth:  02-07-1951      BSA:          1.831 m Patient Age:    58 years      BP:           141/68 mmHg Patient Gender: F             HR:           82 bpm. Exam Location:  Inpatient Procedure: 2D Echo Indications:    Stroke I163.9  History:        Patient has prior history of Echocardiogram examinations, most                 recent 01/12/2018. COPD.  Sonographer:    Mikki Santee RDCS (AE) Referring Phys: [place]  1. Left ventricular ejection fraction, by estimation, is 60 to 65%. The left ventricle has normal function. The left ventricle has no regional wall motion abnormalities. Left ventricular diastolic parameters are consistent with Grade I diastolic dysfunction (impaired relaxation).  2. Right ventricular systolic function is normal. The right ventricular size is normal. There is normal pulmonary artery systolic pressure. The estimated right ventricular systolic pressure is 0000000 mmHg.  3. The mitral valve is normal in structure and function. Trivial mitral valve regurgitation.  4. The aortic valve is tricuspid. Aortic valve regurgitation is not visualized. No aortic stenosis is present.  5. Mobile atrial septum, does not fulfill criteria for atrial septal aneurysm.  6. The inferior vena cava is normal in size with greater than 50% respiratory variability, suggesting right atrial pressure of 3 mmHg. FINDINGS  Left Ventricle: Left ventricular ejection fraction, by estimation, is 60 to 65%. The left ventricle has normal function. The left ventricle has no regional wall motion abnormalities. The left ventricular internal  cavity size was normal in size. There is  no left ventricular hypertrophy. Left ventricular diastolic parameters are consistent with Grade I  diastolic dysfunction (impaired relaxation). Right Ventricle: The right ventricular size is normal. No increase in right ventricular wall thickness. Right ventricular systolic function is normal. There is normal pulmonary artery systolic pressure. The tricuspid regurgitant velocity is 2.39 m/s, and  with an assumed right atrial pressure of 3 mmHg, the estimated right ventricular systolic pressure is 0000000 mmHg. Left Atrium: Left atrial size was normal in size. Right Atrium: Right atrial size was normal in size. Pericardium: There is no evidence of pericardial effusion. Mitral Valve: The mitral valve is normal in structure and function. Trivial mitral valve regurgitation. Tricuspid Valve: The tricuspid valve is normal in structure. Tricuspid valve regurgitation is mild. Aortic Valve: The aortic valve is tricuspid. Aortic valve regurgitation is not visualized. No aortic stenosis is present. Pulmonic Valve: The pulmonic valve was normal in structure. Pulmonic valve regurgitation is not visualized. Aorta: The aortic root is normal in size and structure. Venous: The inferior vena cava is normal in size with greater than 50% respiratory variability, suggesting right atrial pressure of 3 mmHg. IAS/Shunts: No atrial level shunt detected by color flow Doppler.  LEFT VENTRICLE PLAX 2D LVIDd:         3.80 cm  Diastology LVIDs:         2.40 cm  LV e' lateral:   6.85 cm/s LV PW:         1.30 cm  LV E/e' lateral: 14.0 LV IVS:        1.40 cm  LV e' medial:    7.07 cm/s LVOT diam:     2.00 cm  LV E/e' medial:  13.5 LV SV:         71 LV SV Index:   39 LVOT Area:     3.14 cm  RIGHT VENTRICLE RV S prime:     12.90 cm/s TAPSE (M-mode): 2.3 cm LEFT ATRIUM             Index       RIGHT ATRIUM           Index LA diam:        2.90 cm 1.58 cm/m  RA Area:     13.80 cm LA Vol (A2C):   41.8 ml 22.84 ml/m RA Volume:   36.90 ml  20.16 ml/m LA Vol (A4C):   30.1 ml 16.44 ml/m LA Biplane Vol: 38.7 ml 21.14 ml/m  AORTIC VALVE LVOT Vmax:    113.00 cm/s LVOT Vmean:  67.400 cm/s LVOT VTI:    0.226 m  AORTA Ao Root diam: 2.70 cm MITRAL VALVE                TRICUSPID VALVE MV Area (PHT): 2.96 cm     TR Peak grad:   22.8 mmHg MV Decel Time: 256 msec     TR Vmax:        239.00 cm/s MV E velocity: 95.60 cm/s MV A velocity: 112.00 cm/s  SHUNTS MV E/A ratio:  0.85         Systemic VTI:  0.23 m                             Systemic Diam: 2.00 cm Loralie Champagne MD Electronically signed by Loralie Champagne MD Signature Date/Time: 11/04/2019/12:22:20 PM    Final     Procedures Procedures (  including critical care time)  Medications Ordered in ED Medications  traMADol (ULTRAM) tablet 50 mg (has no administration in time range)    ED Course  I have reviewed the triage vital signs and the nursing notes.  Pertinent labs & imaging results that were available during my care of the patient were reviewed by me and considered in my medical decision making (see chart for details).  Clinical Course as of Nov 05 552  Sun Nov 06, 2019  0458 Spoke with Dr. Leonel Ramsay, neurology.  He is reviewed her imaging.  Patient is currently neurologically intact.  Does not recommend any further imaging at this time.  Continue aspirin and Plavix.   [CH]  0530 On recheck, discussed with patient plan of care including continuing aspirin and Plavix at home and follow-up for loop recorder.  She is requesting something for her head neck pain.   [CH]    Clinical Course User Index [CH] Jaser Fullen, Barbette Hair, MD   MDM Rules/Calculators/A&P                      Patient presents with ongoing left neck pain.  Also describes left arm paresthesias and weakness.  Currently those symptoms have resolved.  She admitted to the hospital.  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  She was started on aspirin and Plavix.  She is currently nontoxic-appearing and vital signs are notable for blood pressure 166/88.  She has tenderness over the  musculature of the left side of the neck.  She also has thrombus on that side.  Her neurologic exam is normal and unchanged from compared to her discharge neurology note.  I have reviewed her lab work and that is largely reassuring.  I discussed the patient with Dr. Leonel Ramsay.  Low suspicion at this time for recurrent CVA/TIA.  No indication for repeat imaging.  She needs to continue her aspirin and Plavix and follow-up with neurology and cardiology for loop recorder.  She is requesting something for her pain.  Patient was given tramadol.  Will discharge with PCP follow-up.  After history, exam, and medical workup I feel the patient has been appropriately medically screened and is safe for discharge home. Pertinent diagnoses were discussed with the patient. Patient was given return precautions.  Final Clinical Impression(s) / ED Diagnoses Final diagnoses:  Neck pain  Paresthesias    Rx / DC Orders ED Discharge Orders    None       Merryl Hacker, MD 11/06/19 [phone]

## 2019-11-06 NOTE — Discharge Instructions (Addendum)
You were seen today for neck pain and ongoing left upper extremity symptoms.  You need to continue your aspirin and Plavix at home.  Follow-up with your primary physician and for loop recorder as planned.  If you develop difficulty walking, weakness, strokelike symptoms, you should be reevaluated immediately.

## 2019-11-07 LAB — NOVEL CORONAVIRUS, NAA (HOSP ORDER, SEND-OUT TO REF LAB; TAT 18-24 HRS): SARS-CoV-2, NAA: NOT DETECTED

## 2019-11-08 ENCOUNTER — Other Ambulatory Visit: Payer: Self-pay

## 2019-11-08 ENCOUNTER — Encounter: Payer: Self-pay | Admitting: Family Medicine

## 2019-11-08 ENCOUNTER — Ambulatory Visit (INDEPENDENT_AMBULATORY_CARE_PROVIDER_SITE_OTHER): Payer: Medicare HMO | Admitting: Family Medicine

## 2019-11-08 VITALS — BP 130/72 | HR 99 | Wt 144.8 lb

## 2019-11-08 DIAGNOSIS — R519 Headache, unspecified: Secondary | ICD-10-CM | POA: Diagnosis not present

## 2019-11-08 DIAGNOSIS — I739 Peripheral vascular disease, unspecified: Secondary | ICD-10-CM | POA: Diagnosis not present

## 2019-11-08 DIAGNOSIS — I63449 Cerebral infarction due to embolism of unspecified cerebellar artery: Secondary | ICD-10-CM

## 2019-11-08 MED ORDER — ONDANSETRON 4 MG PO TBDP: 4.0000 mg | ORAL_TABLET | Freq: Three times a day (TID) | ORAL | 0 refills | Status: DC | PRN

## 2019-11-08 MED ORDER — ATORVASTATIN CALCIUM 80 MG PO TABS: 80.0000 mg | ORAL_TABLET | Freq: Every day | ORAL | 3 refills | Status: DC

## 2019-11-08 MED ORDER — ASPIRIN EC 81 MG PO TBEC: 81.0000 mg | DELAYED_RELEASE_TABLET | Freq: Every day | ORAL | 3 refills | Status: DC

## 2019-11-08 NOTE — Progress Notes (Signed)
    SUBJECTIVE:   CHIEF COMPLAINT / HPI:   Hospital f/u for CVA and COPD exacerbation/sinusitis Patient reports that she is feeling well overall since her hospitalization. She is taking Plavix, amlodipine, Augmentin, prednisone, and Trelegy as prescribed. She has not yet picked up aspirin or atorvastatin. She says that she does have a right-sided, temporal headache which she has had off and on, including on presentation to the hospital. She denies any facial drooping, changes in speech, confusion, or weakness. She denies vision changes. She does have numbness in her left leg and foot which she attributes to the popliteal artery bypass with graft that was placed in her left thigh by vascular surgery several years ago. This numbness has been going on for the last several months. She she denies shortness of breath, cough, or increased sputum production.   PERTINENT  PMH / PSH: CVA, PAD, hypertension, tobacco abuse  OBJECTIVE:   BP 130/72   Pulse 99   Wt 144 lb 12.8 oz (65.7 kg)   SpO2 100%   BMI 24.85 kg/m   General: well appearing, appears older than stated age, pleasant HEENT: No palpable temporal artery on the right, but patient endorses tenderness to palpation in this area Cardiac: RRR, no MRG Respiratory: CTAB, no rhonchi, rales, or wheezing, normal work of breathing Skin: no rashes or other lesions, bilateral feet are warm and well perfused   ASSESSMENT/PLAN:   CVA (cerebral vascular accident) ([place]) Reviewed patient's medications and the importance of taking Plavix and aspirin each day for the next 3 months then aspirin alone thereafter. Sent a new prescription for aspirin and advised that patient pick this up and start it today. Also talked about the importance of taking Lipitor daily and sent this to the pharmacy.  PAD (peripheral artery disease) ([place]). left leg Patient's numbness is likely related to her PAD. Last ABI was 0.98 on that side after the bypass and graft were  performed. Since patient does not have rest pain here and has warm extremities, I am not concerned about and acute threatened limb. I encouraged her to follow-up with vascular surgery about this issue.  Cephalalgia Due to patient's tenderness in the temporal region, will check an ESR today. This seems to be a chronic problem for the patient, which is reassuring.   Encourage patient to meet with her PCP in the next month to follow-up on her acute and chronic issues and to ensure that she continues to take her medications as prescribed.   Kathrene Alu, MD [place]

## 2019-11-08 NOTE — Patient Instructions (Signed)
It was nice meeting you today Ms. [name]!  Please start taking atorvastatin and aspirin every day along with clopidogrel and amlodipine.  Please also finish your course of Augmentin and prednisone for your breathing.  I have also sent in Zofran for your nausea.  Please make sure to take Trelegy once per day and Proventil only when needed.  We are getting a blood test today to follow-up with your headache.  If you experience any weakness or facial droop or difficulty speaking, please go straight to the emergency department or call EMS.  Please follow-up in 2 to 4 weeks to meet your PCP and follow-up on your headaches and numbness.  If you have any questions or concerns, please feel free to call the clinic.   Be well,  Dr. Shan Levans

## 2019-11-09 LAB — SEDIMENTATION RATE: Sed Rate: 14 mm/hr (ref 0–40)

## 2019-11-09 NOTE — Assessment & Plan Note (Signed)
Patient's numbness is likely related to her PAD. Last ABI was 0.98 on that side after the bypass and graft were performed. Since patient does not have rest pain here and has warm extremities, I am not concerned about and acute threatened limb. I encouraged her to follow-up with vascular surgery about this issue.

## 2019-11-09 NOTE — Assessment & Plan Note (Signed)
Reviewed patient's medications and the importance of taking Plavix and aspirin each day for the next 3 months then aspirin alone thereafter. Sent a new prescription for aspirin and advised that patient pick this up and start it today. Also talked about the importance of taking Lipitor daily and sent this to the pharmacy.

## 2019-11-09 NOTE — Assessment & Plan Note (Signed)
Due to patient's tenderness in the temporal region, will check an ESR today. This seems to be a chronic problem for the patient, which is reassuring.

## 2019-11-11 ENCOUNTER — Telehealth: Payer: Self-pay | Admitting: Interventional Cardiology

## 2019-11-11 NOTE — Telephone Encounter (Signed)
Patient states she is returning a call. 

## 2019-11-11 NOTE — Telephone Encounter (Signed)
Not sure who called the pt though it looks like she has appt 3/17 with Dr. Lovena Le. I will send call to both Dr. Tanna Furry nurse and scheduler.

## 2019-11-14 NOTE — Telephone Encounter (Signed)
I called patient to tell her that her sed rate was normal, which is reassuring that she does not have an issue with her temporal artery that would be causing her right-sided headache.  She asked if she still needs to get the loop recorder, and I told her that she should still get this since this is not related to the blood test that I got and is instead related to her stroke.  All other questions were answered, and patient stated that she would follow-up with cardiology to get her loop recorder.

## 2019-11-16 ENCOUNTER — Telehealth (INDEPENDENT_AMBULATORY_CARE_PROVIDER_SITE_OTHER): Payer: Medicare HMO | Admitting: Internal Medicine

## 2019-11-16 ENCOUNTER — Telehealth: Payer: Medicare HMO | Admitting: Internal Medicine

## 2019-11-16 ENCOUNTER — Other Ambulatory Visit: Payer: Self-pay

## 2019-11-16 DIAGNOSIS — I639 Cerebral infarction, unspecified: Secondary | ICD-10-CM

## 2019-11-16 DIAGNOSIS — Z8673 Personal history of transient ischemic attack (TIA), and cerebral infarction without residual deficits: Secondary | ICD-10-CM | POA: Diagnosis not present

## 2019-11-16 NOTE — Progress Notes (Signed)
Electrophysiology TeleHealth Note   Due to national recommendations of social distancing due to COVID 19, an audio/video telehealth visit is felt to be most appropriate for this patient at this time.  See MyChart message from today for the patient's consent to telehealth for [facility].   Date:  11/16/2019   ID:  Corella Demchak, DOB [birth year], MRN [id number]  Location: patient's home  Provider location: [address] [place]  Evaluation Performed: Follow-up visit  PCP:  [name], DO  Cardiologist:  No primary care provider on file.  Electrophysiologist:  Dr Lovena Le  Chief Complaint:  "I feel ok."  History of Present Illness:    Mekesha Whittiker is a 69 y.o. female who presents via audio/video conferencing for a telehealth visit today. She has a h/o peripheral vascular disease and presented with a stroke a couple of weeks ago. She did not have a TEE but her TTE was normal except for mildly elevated pulmonary pressures. She was monitored and did not have atrial fib.  She reports no deficits. She did not have a Loop inserted.     Past Medical History:  Diagnosis Date  . Abdominal pain, epigastric 02/23/2013  . Abdominal pain, unspecified site 08/15/2013  . Abnormal neurological exam 04/11/2016  . Asthma   . Cataracts, bilateral 05/03/2014  . Chronic bronchitis ([place])   . Chronic lower back pain   . COPD (chronic obstructive pulmonary disease) ([place])   . Daily headache   . De Quervain's tenosynovitis, left 11/09/2014  . GERD (gastroesophageal reflux disease)   . Hx of colonic polyps   . Nausea & vomiting   . PSORIASIS 12/29/2006   Qualifier: Diagnosis of  By: Jobe Igo MD, Shanon Brow    . Right leg pain 04/02/2016  . Smoker     Past Surgical History:  Procedure Laterality Date  . ABDOMINAL AORTOGRAM W/LOWER EXTREMITY N/A 10/06/2016   Procedure: Abdominal Aortogram w/Lower Extremity;  Surgeon: Waynetta Sandy, MD;  Location: [facility];  Service:  Cardiovascular;  Laterality: N/A;  . ABDOMINAL AORTOGRAM W/LOWER EXTREMITY N/A 02/10/2018   Procedure: ABDOMINAL AORTOGRAM W/LOWER EXTREMITY;  Surgeon: Waynetta Sandy, MD;  Location: [facility];  Service: Cardiovascular;  Laterality: N/A;  . ABDOMINAL HERNIA REPAIR    . APPENDECTOMY    . COLONOSCOPY W/ POLYPECTOMY  02/26/2005  . FEMORAL-POPLITEAL BYPASS GRAFT Left 04/20/2018   Procedure: Left FEMORAL-POPLITEAL ARTERY Bypass Graft using Gore Propaten Vascular Graft;  Surgeon: Waynetta Sandy, MD;  Location: [facility];  Service: Vascular;  Laterality: Left;  . HERNIA REPAIR    . LOWER EXTREMITY ANGIOGRAPHY Bilateral 04/19/2018   Procedure: LOWER EXTREMITY ANGIOGRAPHY;  Surgeon: Waynetta Sandy, MD;  Location: [facility];  Service: Cardiovascular;  Laterality: Bilateral;  . PERIPHERAL VASCULAR ATHERECTOMY Right 10/06/2016   Procedure: Peripheral Vascular Atherectomy;  Surgeon: Waynetta Sandy, MD;  Location: [facility];  Service: Cardiovascular;  Laterality: Right;  . PERIPHERAL VASCULAR INTERVENTION Right 10/06/2016   Procedure: Peripheral Vascular Intervention;  Surgeon: Waynetta Sandy, MD;  Location: [facility];  Service: Cardiovascular;  Laterality: Right;  SFA  . PERIPHERAL VASCULAR INTERVENTION Left 02/10/2018   Procedure: PERIPHERAL VASCULAR INTERVENTION;  Surgeon: Waynetta Sandy, MD;  Location: [facility];  Service: Cardiovascular;  Laterality: Left;  . TONSILLECTOMY  1960s  . UPPER GASTROINTESTINAL ENDOSCOPY  03/22/2010, 02/13/2005  . VAGINAL HYSTERECTOMY     partial    Current Outpatient  Medications  Medication Sig Dispense Refill  . amLODipine (NORVASC) 5 MG tablet Take 1 tablet (5 mg total) by mouth daily. 90 tablet 0  . aspirin EC 81 MG tablet Take 1 tablet (81 mg total) by mouth daily. 90 tablet 3  . atorvastatin (LIPITOR) 80 MG tablet Take 1 tablet (80 mg total) by mouth daily at 6 PM. 90  tablet 3  . clopidogrel (PLAVIX) 75 MG tablet Take 1 tablet (75 mg total) by mouth daily. 30 tablet 2  . Fluticasone-Umeclidin-Vilant (TRELEGY ELLIPTA) 100-62.5-25 MCG/INH AEPB Inhale 1 puff into the lungs daily. 60 each 0  . ondansetron (ZOFRAN ODT) 4 MG disintegrating tablet Take 1 tablet (4 mg total) by mouth every 8 (eight) hours as needed for nausea or vomiting. 20 tablet 0  . VENTOLIN HFA 108 (90 Base) MCG/ACT inhaler INHALE 2 PUFFS BY MOUTH EVERY 4 HOURS AS NEEDED FOR WHEEZING OR SHORTNESS OF BREATH (Patient taking differently: Inhale 2 puffs into the lungs every 4 (four) hours as needed for wheezing or shortness of breath. ) 18 g 0   No current facility-administered medications for this visit.    Allergies:   Patient has no known allergies.   Social History:  The patient  reports that she has been smoking cigarettes. She has a 11.75 pack-year smoking history. She has never used smokeless tobacco. She reports previous alcohol use. She reports current drug use. Drug: Marijuana.   Family History:  The patient's  family history includes Cancer in her father, mother, sister, and another family member; Coronary artery disease in an other family member; Heart disease in her mother; Hypertension in her father, mother, and sister.   ROS:  Please see the history of present illness.   All other systems are personally reviewed and negative.    Exam:    Vital Signs: none  Labs/Other Tests and Data Reviewed:    Recent Labs: 11/05/2019: ALT 9; BUN 19; Creatinine, Ser 0.90; Hemoglobin 12.6; Platelets 272; Potassium 4.0; Sodium 139   Wt Readings from Last 3 Encounters:  11/08/19 144 lb 12.8 oz (65.7 kg)  11/05/19 145 lb (65.8 kg)  03/24/19 154 lb 5.2 oz (70 kg)     Other studies personally reviewed:    ASSESSMENT & PLAN:    1.  Cryptogenic stroke - I discussed the treatment options with the patient and her daughter and recommended ILR insertion which can be done in theoffice. I described  the procedure in detail with the patient and her daughter and we will call back to see if they would like to proceed.    COVID 19 screen The patient denies symptoms of COVID 19 at this time.  The importance of social distancing was discussed today.  Follow-up:  TBD Next remote: n/a  Current medicines are reviewed at length with the patient today.   The patient does not have concerns regarding her medicines.  The following changes were made today:  none  Labs/ tests ordered today include: none No orders of the defined types were placed in this encounter.    Patient Risk:  after full review of this patients clinical status, I feel that they are at moderate risk at this time.  Today, I have spent 15 minutes with the patient with telehealth technology discussing her stroke and atrial fib .    Signed, Cristopher Peru, MD  11/16/2019 10:21 AM     [address] [place] [address] [phone] (office) [phone] (fax)

## 2019-11-22 ENCOUNTER — Telehealth: Payer: Self-pay | Admitting: Internal Medicine

## 2019-11-22 NOTE — Telephone Encounter (Signed)
[name] is calling requesting literature in regards to her loop implant appointment coming up on 12/13/19. Please advise.

## 2019-11-23 ENCOUNTER — Other Ambulatory Visit: Payer: Self-pay

## 2019-11-23 ENCOUNTER — Ambulatory Visit (INDEPENDENT_AMBULATORY_CARE_PROVIDER_SITE_OTHER): Payer: Medicare HMO | Admitting: Family Medicine

## 2019-11-23 ENCOUNTER — Encounter: Payer: Self-pay | Admitting: Family Medicine

## 2019-11-23 VITALS — BP 104/60 | HR 91 | Ht 67.0 in | Wt 143.5 lb

## 2019-11-23 DIAGNOSIS — Z720 Tobacco use: Secondary | ICD-10-CM | POA: Diagnosis not present

## 2019-11-23 DIAGNOSIS — I739 Peripheral vascular disease, unspecified: Secondary | ICD-10-CM | POA: Diagnosis not present

## 2019-11-23 DIAGNOSIS — I63449 Cerebral infarction due to embolism of unspecified cerebellar artery: Secondary | ICD-10-CM | POA: Diagnosis not present

## 2019-11-23 DIAGNOSIS — R87622 Low grade squamous intraepithelial lesion on cytologic smear of vagina (LGSIL): Secondary | ICD-10-CM

## 2019-11-23 DIAGNOSIS — J449 Chronic obstructive pulmonary disease, unspecified: Secondary | ICD-10-CM

## 2019-11-23 DIAGNOSIS — F1290 Cannabis use, unspecified, uncomplicated: Secondary | ICD-10-CM

## 2019-11-23 DIAGNOSIS — Z1231 Encounter for screening mammogram for malignant neoplasm of breast: Secondary | ICD-10-CM

## 2019-11-23 DIAGNOSIS — Z0000 Encounter for general adult medical examination without abnormal findings: Secondary | ICD-10-CM

## 2019-11-23 DIAGNOSIS — R1115 Cyclical vomiting syndrome unrelated to migraine: Secondary | ICD-10-CM | POA: Diagnosis not present

## 2019-11-23 MED ORDER — PROMETHAZINE HCL 12.5 MG PO TABS: 12.5000 mg | ORAL_TABLET | Freq: Three times a day (TID) | ORAL | 0 refills | Status: DC | PRN

## 2019-11-23 MED ORDER — NICOTINE POLACRILEX 2 MG MT GUM: 2.0000 mg | CHEWING_GUM | OROMUCOSAL | 0 refills | Status: DC | PRN

## 2019-11-23 MED ORDER — VENTOLIN HFA 108 (90 BASE) MCG/ACT IN AERS: 2.0000 | INHALATION_SPRAY | RESPIRATORY_TRACT | 1 refills | Status: DC | PRN

## 2019-11-23 NOTE — Progress Notes (Signed)
SUBJECTIVE:   CHIEF COMPLAINT / HPI: Follow-up with PCP  [name] is a 69 year old female presenting discussed the following:  CVA/PAD: Notably had a recent cryptogenic stroke in early 10/2019 via right vertebral artery thrombus with punctate R medullary and cerebellar infarcts.  She has not followed up with cardiology, will be having a loop recorder placed in April.  Has not heard from neurology.  On aspirin, Plavix daily for the next 3 months, aspirin alone thereafter.  Additionally on Lipitor 80 mg, tolerating this well.  Nausea/vomiting: Chronic and persistent (since 2011).  States she is nauseous with NBNB emesis approximately 3 times a week, few episodes at a time.  No abdominal pain associated.  Had extensive GI work-up back in 2011-2013 including a gastric emptying study, CT abdomen, endoscopy which were generally normal.  She did have a colonoscopy and endoscopy in 2014 showing several tubular adenomas and H. pylori severe gastritis (cannot see within records if this was treated, but presume).  She is a chronic marijuana user, reports she has stopped for several months of the time with continued presence of her symptoms.  She would like to go back to GI to see if there is any additional therapy she can try.  She has used Zofran on a consistent basis in the past however does not seem to help anymore.  Has had good help with Phenergan.  Tobacco use: Smokes 2 cigarettes a day.  Has not completely quit yet due to using this as a activity to keep her busy, likes to have a cigarette in her hand.  Health maintenance: due for Tdap, mammogram, colonoscopy, pneumonia vaccine, DEXA scan  PERTINENT  PMH / PSH: PAD with an occlusion in the common femoral artery in 2018 s/p arthrectomy of right SFA and stent in proximal right SFA, additionally has left leg PAD s/p common femoral endarterectomy with bypass, recent CVA in 10/2019, aortic atherosclerosis, asthma/COPD, hiatal hernia, psoriasis, tobacco  use, prediabetes, hypertensive retinopathy bilaterally  OBJECTIVE:   BP 104/60 Comment: provider informed  Pulse 91   Ht 5\' 7"  (1.702 m)   Wt 143 lb 8 oz (65.1 kg)   SpO2 99%   BMI 22.48 kg/m   General: Alert, NAD HEENT: NCAT, MMM Cardiac: RRR no m/g/r Lungs: Clear bilaterally, no increased WOB  Abdomen: soft, non-tender, non-distended, normoactive BS, no palpable hepatosplenomegaly Msk: Moves all extremities spontaneously  Neuro: Alert and oriented X3.  EOMI. smile symmetrical.  Ext: Warm, dry, 2+ distal pulses  ASSESSMENT/PLAN:   CVA (cerebral vascular accident) ([place]) 10/2019 without significant residual deficits.  Currently on ASA, Plavix, and Lipitor.  Has not heard from neurology yet, provided with office number to schedule an appointment.  Awaiting loop recorder placement by cardiology in April to rule out paroxysmal atrial fibrillation as precipitant.  Working towards risk factor modification, including tobacco cessation and continued BP control.  Cyclical vomiting 0000000 history of intermittent N/V with substantial GI work-up in 2000 07/2012 was generally unremarkable with the exception of severe gastritis due to H. pylori.  Last endoscopy/colonoscopy in 2014 with multiple tubular adenomas without dysplasia, does have a family history of colon cancer through her mother and sister.  Suspect her N/V may be related to chronic marijuana use, however will refer patient back to GI for further evaluation per request and any additional management that may be benefit as this impacts her quality of life. -Referral to GI, (may also be due for screening colonoscopy as well) -Sent in short course of  Phenergan as Zofran no longer effective, counseled on safe use of this medication -Can try ginger/peppermint -Encouraged daily hydration  Tobacco abuse 2 cigarettes daily.  She is willing to quit however would like NRT to help her cut back.  Given her minimal tobacco use, discussed that a  nicotine patch would likely be more than she is receiving on a daily basis already.  Sent prescription for nicotine gum to use as needed for cravings and also discussed 1 800 quit now/relaxation techniques.  Will follow up in 1 month to see how she is progressing.  Healthcare maintenance Please order for mammogram, provided with breast center information.  Additionally referred to GI above and will assess if follow-up colonoscopy is indicated at this time.  Will broach conversation of DEXA scan to screen for osteoporosis and pneumonia vaccine on follow-up.    Follow-up in 1 month for above or sooner if needed.  [name], [name]

## 2019-11-23 NOTE — Patient Instructions (Addendum)
It was wonderful to see you today.  I am going to refer you to the stomach doctors to further evaluate your nausea and vomiting to see if there is any additional regimens we can try.  They will also get you scheduled for a screening colonoscopy.  I have sent in a small course of Phenergan, please make sure you are very careful with this medicine and try at nighttime when you are sitting as this can make you drowsy and lead to falls.  Please do not drive when you use medication.   I have placed an order for mammogram, please call the [facility] breast center to schedule this.  In addition, I will send in some nicotine gum to help you completely quit smoking.  You can also call 1 800 quit now for further guidance and support.  I would like to see you in 1 month to see how you are doing and follow-up.  [facility] [phone]  [address], [place], [place] [address]

## 2019-11-24 NOTE — Telephone Encounter (Signed)
Mailing information to Pt today as requested.  Attempted to call Pt to let her know, went to VM and unable to leave message.  Will mail information and await further needs.

## 2019-11-27 ENCOUNTER — Encounter: Payer: Self-pay | Admitting: Family Medicine

## 2019-11-27 DIAGNOSIS — Z0000 Encounter for general adult medical examination without abnormal findings: Secondary | ICD-10-CM | POA: Insufficient documentation

## 2019-11-27 NOTE — Assessment & Plan Note (Signed)
10/2019 without significant residual deficits.  Currently on ASA, Plavix, and Lipitor.  Has not heard from neurology yet, provided with office number to schedule an appointment.  Awaiting loop recorder placement by cardiology in April to rule out paroxysmal atrial fibrillation as precipitant.  Working towards risk factor modification, including tobacco cessation and continued BP control.

## 2019-11-27 NOTE — Assessment & Plan Note (Signed)
10-year history of intermittent N/V with substantial GI work-up in 2000 07/2012 was generally unremarkable with the exception of severe gastritis due to H. pylori.  Last endoscopy/colonoscopy in 2014 with multiple tubular adenomas without dysplasia, does have a family history of colon cancer through her mother and sister.  Suspect her N/V may be related to chronic marijuana use, however will refer patient back to GI for further evaluation per request and any additional management that may be benefit as this impacts her quality of life. -Referral to GI, (may also be due for screening colonoscopy as well) -Sent in short course of Phenergan as Zofran no longer effective, counseled on safe use of this medication -Can try ginger/peppermint -Encouraged daily hydration

## 2019-11-27 NOTE — Assessment & Plan Note (Addendum)
2 cigarettes daily.  She is willing to quit however would like NRT to help her cut back.  Given her minimal tobacco use, discussed that a nicotine patch would likely be more than she is receiving on a daily basis already.  Sent prescription for nicotine gum to use as needed for cravings and also discussed 1 800 quit now/relaxation techniques.  Will follow up in 1 month to see how she is progressing.

## 2019-11-27 NOTE — Assessment & Plan Note (Addendum)
Please order for mammogram, provided with breast center information.  Additionally referred to GI above and will assess if follow-up colonoscopy is indicated at this time.  Will broach conversation of DEXA scan to screen for osteoporosis and pneumonia vaccine on follow-up.

## 2019-12-06 ENCOUNTER — Ambulatory Visit (INDEPENDENT_AMBULATORY_CARE_PROVIDER_SITE_OTHER): Payer: Medicare HMO | Admitting: Neurology

## 2019-12-06 ENCOUNTER — Encounter: Payer: Self-pay | Admitting: Neurology

## 2019-12-06 ENCOUNTER — Other Ambulatory Visit: Payer: Self-pay

## 2019-12-06 VITALS — BP 134/82 | HR 76 | Temp 97.5°F | Ht 67.0 in | Wt 139.5 lb

## 2019-12-06 DIAGNOSIS — I639 Cerebral infarction, unspecified: Secondary | ICD-10-CM | POA: Diagnosis not present

## 2019-12-06 DIAGNOSIS — G44209 Tension-type headache, unspecified, not intractable: Secondary | ICD-10-CM

## 2019-12-06 DIAGNOSIS — G4452 New daily persistent headache (NDPH): Secondary | ICD-10-CM | POA: Diagnosis not present

## 2019-12-06 DIAGNOSIS — I63211 Cerebral infarction due to unspecified occlusion or stenosis of right vertebral arteries: Secondary | ICD-10-CM

## 2019-12-06 MED ORDER — DIVALPROEX SODIUM ER 500 MG PO TB24: 500.0000 mg | ORAL_TABLET | Freq: Every day | ORAL | 3 refills | Status: DC

## 2019-12-06 NOTE — Patient Instructions (Addendum)
I had a long d/w patient and her daughter about her recent stroke, right vertebral artery occlusion,risk for recurrent stroke/TIAs, personally independently reviewed imaging studies and stroke evaluation results and answered questions.Continue aspirin and Plavix for 2 more months and then discontinue aspirin and stay on Plavix alone for secondary stroke prevention and maintain strict control of hypertension with blood pressure goal below 130/90, diabetes with hemoglobin A1c goal below 6.5% and lipids with LDL cholesterol goal below 70 mg/dL. I also advised the patient to eat a healthy diet with plenty of whole grains, cereals, fruits and vegetables, exercise regularly and maintain ideal body weight.  She was advised to quit smoking completely.  She was advised to keep her upcoming appointment for loop recorder insertion next week.  I recommend she do regular neck stretching exercises for her tension headaches and trial of Depakote ER 500 mg daily for headache.  She was cautioned to reduce Tylenol usage to avoid analgesic rebound headaches.  Followup in the future with my nurse practitioner Janett Billow in 3 months or call earlier if necessary.   Neck Exercises Ask your health care provider which exercises are safe for you. Do exercises exactly as told by your health care provider and adjust them as directed. It is normal to feel mild stretching, pulling, tightness, or discomfort as you do these exercises. Stop right away if you feel sudden pain or your pain gets worse. Do not begin these exercises until told by your health care provider. Neck exercises can be important for many reasons. They can improve strength and maintain flexibility in your neck, which will help your upper back and prevent neck pain. Stretching exercises Rotation neck stretching  1. Sit in a chair or stand up. 2. Place your feet flat on the floor, shoulder width apart. 3. Slowly turn your head (rotate) to the right until a slight stretch  is felt. Turn it all the way to the right so you can look over your right shoulder. Do not tilt or tip your head. 4. Hold this position for 10-30 seconds. 5. Slowly turn your head (rotate) to the left until a slight stretch is felt. Turn it all the way to the left so you can look over your left shoulder. Do not tilt or tip your head. 6. Hold this position for 10-30 seconds. Repeat __________ times. Complete this exercise __________ times a day. Neck retraction 1. Sit in a sturdy chair or stand up. 2. Look straight ahead. Do not bend your neck. 3. Use your fingers to push your chin backward (retraction). Do not bend your neck for this movement. Continue to face straight ahead. If you are doing the exercise properly, you will feel a slight sensation in your throat and a stretch at the back of your neck. 4. Hold the stretch for 1-2 seconds. Repeat __________ times. Complete this exercise __________ times a day. Strengthening exercises Neck press 1. Lie on your back on a firm bed or on the floor with a pillow under your head. 2. Use your neck muscles to push your head down on the pillow and straighten your spine. 3. Hold the position as well as you can. Keep your head facing up (in a neutral position) and your chin tucked. 4. Slowly count to 5 while holding this position. Repeat __________ times. Complete this exercise __________ times a day. Isometrics These are exercises in which you strengthen the muscles in your neck while keeping your neck still (isometrics). 1. Sit in a supportive chair and  place your hand on your forehead. 2. Keep your head and face facing straight ahead. Do not flex or extend your neck while doing isometrics. 3. Push forward with your head and neck while pushing back with your hand. Hold for 10 seconds. 4. Do the sequence again, this time putting your hand against the back of your head. Use your head and neck to push backward against the hand pressure. 5. Finally, do the  same exercise on either side of your head, pushing sideways against the pressure of your hand. Repeat __________ times. Complete this exercise __________ times a day. Prone head lifts 1. Lie face-down (prone position), resting on your elbows so that your chest and upper back are raised. 2. Start with your head facing downward, near your chest. Position your chin either on or near your chest. 3. Slowly lift your head upward. Lift until you are looking straight ahead. Then continue lifting your head as far back as you can comfortably stretch. 4. Hold your head up for 5 seconds. Then slowly lower it to your starting position. Repeat __________ times. Complete this exercise __________ times a day. Supine head lifts 1. Lie on your back (supine position), bending your knees to point to the ceiling and keeping your feet flat on the floor. 2. Lift your head slowly off the floor, raising your chin toward your chest. 3. Hold for 5 seconds. Repeat __________ times. Complete this exercise __________ times a day. Scapular retraction 1. Stand with your arms at your sides. Look straight ahead. 2. Slowly pull both shoulders (scapulae) backward and downward (retraction) until you feel a stretch between your shoulder blades in your upper back. 3. Hold for 10-30 seconds. 4. Relax and repeat. Repeat __________ times. Complete this exercise __________ times a day. Contact a health care provider if:  Your neck pain or discomfort gets much worse when you do an exercise.  Your neck pain or discomfort does not improve within 2 hours after you exercise. If you have any of these problems, stop exercising right away. Do not do the exercises again unless your health care provider says that you can. Get help right away if:  You develop sudden, severe neck pain. If this happens, stop exercising right away. Do not do the exercises again unless your health care provider says that you can. This information is not  intended to replace advice given to you by your health care provider. Make sure you discuss any questions you have with your health care provider. Document Revised: 06/16/2018 Document Reviewed: 06/16/2018 Elsevier Patient Education  [employer].  Stroke Prevention Some medical conditions and behaviors are associated with a higher chance of having a stroke. You can help prevent a stroke by making nutrition, lifestyle, and other changes, including managing any medical conditions you may have. What nutrition changes can be made?   Eat healthy foods. You can do this by: ? Choosing foods high in fiber, such as fresh fruits and vegetables and whole grains. ? Eating at least 5 or more servings of fruits and vegetables a day. Try to fill half of your plate at each meal with fruits and vegetables. ? Choosing lean protein foods, such as lean cuts of meat, poultry without skin, fish, tofu, beans, and nuts. ? Eating low-fat dairy products. ? Avoiding foods that are high in salt (sodium). This can help lower blood pressure. ? Avoiding foods that have saturated fat, trans fat, and cholesterol. This can help prevent high cholesterol. ? Avoiding processed and  premade foods.  Follow your health care provider's specific guidelines for losing weight, controlling high blood pressure (hypertension), lowering high cholesterol, and managing diabetes. These may include: ? Reducing your daily calorie intake. ? Limiting your daily sodium intake to 1,500 milligrams (mg). ? Using only healthy fats for cooking, such as olive oil, canola oil, or sunflower oil. ? Counting your daily carbohydrate intake. What lifestyle changes can be made?  Maintain a healthy weight. Talk to your health care provider about your ideal weight.  Get at least 30 minutes of moderate physical activity at least 5 days a week. Moderate activity includes brisk walking, biking, and swimming.  Do not use any products that contain nicotine  or tobacco, such as cigarettes and e-cigarettes. If you need help quitting, ask your health care provider. It may also be helpful to avoid exposure to secondhand smoke.  Limit alcohol intake to no more than 1 drink a day for nonpregnant women and 2 drinks a day for men. One drink equals 12 oz of beer, 5 oz of wine, or 1 oz of hard liquor.  Stop any illegal drug use.  Avoid taking birth control pills. Talk to your health care provider about the risks of taking birth control pills if: ? You are over 13 years old. ? You smoke. ? You get migraines. ? You have ever had a blood clot. What other changes can be made?  Manage your cholesterol levels. ? Eating a healthy diet is important for preventing high cholesterol. If cholesterol cannot be managed through diet alone, you may also need to take medicines. ? Take any prescribed medicines to control your cholesterol as told by your health care provider.  Manage your diabetes. ? Eating a healthy diet and exercising regularly are important parts of managing your blood sugar. If your blood sugar cannot be managed through diet and exercise, you may need to take medicines. ? Take any prescribed medicines to control your diabetes as told by your health care provider.  Control your hypertension. ? To reduce your risk of stroke, try to keep your blood pressure below 130/80. ? Eating a healthy diet and exercising regularly are an important part of controlling your blood pressure. If your blood pressure cannot be managed through diet and exercise, you may need to take medicines. ? Take any prescribed medicines to control hypertension as told by your health care provider. ? Ask your health care provider if you should monitor your blood pressure at home. ? Have your blood pressure checked every year, even if your blood pressure is normal. Blood pressure increases with age and some medical conditions.  Get evaluated for sleep disorders (sleep apnea). Talk to  your health care provider about getting a sleep evaluation if you snore a lot or have excessive sleepiness.  Take over-the-counter and prescription medicines only as told by your health care provider. Aspirin or blood thinners (antiplatelets or anticoagulants) may be recommended to reduce your risk of forming blood clots that can lead to stroke.  Make sure that any other medical conditions you have, such as atrial fibrillation or atherosclerosis, are managed. What are the warning signs of a stroke? The warning signs of a stroke can be easily remembered as BEFAST.  B is for balance. Signs include: ? Dizziness. ? Loss of balance or coordination. ? Sudden trouble walking.  E is for eyes. Signs include: ? A sudden change in vision. ? Trouble seeing.  F is for face. Signs include: ? Sudden weakness or numbness  of the face. ? The face or eyelid drooping to one side.  A is for arms. Signs include: ? Sudden weakness or numbness of the arm, usually on one side of the body.  S is for speech. Signs include: ? Trouble speaking (aphasia). ? Trouble understanding.  T is for time. ? These symptoms may represent a serious problem that is an emergency. Do not wait to see if the symptoms will go away. Get medical help right away. Call your local emergency services (911 in the [place] Do not drive yourself to the hospital.  Other signs of stroke may include: ? A sudden, severe headache with no known cause. ? Nausea or vomiting. ? Seizure. Where to find more information For more information, visit:  American Stroke Association: www.strokeassociation.org  National Stroke Association: www.stroke.org Summary  You can prevent a stroke by eating healthy, exercising, not smoking, limiting alcohol intake, and managing any medical conditions you may have.  Do not use any products that contain nicotine or tobacco, such as cigarettes and e-cigarettes. If you need help quitting, ask your health care  provider. It may also be helpful to avoid exposure to secondhand smoke.  Remember BEFAST for warning signs of stroke. Get help right away if you or a loved one has any of these signs. This information is not intended to replace advice given to you by your health care provider. Make sure you discuss any questions you have with your health care provider. Document Revised: 07/31/2017 Document Reviewed: 09/23/2016 Elsevier Patient Education  2020 [employer].

## 2019-12-06 NOTE — Progress Notes (Signed)
[facility] [address] [name]. [place] [address] [phone]       OFFICE FOLLOW-UP NOTE  [name] Date of Birth:  May 24, 1951 Medical Record Number:  [id number]   HPI: Ms. Forsey is a 69 year old African-American lady seen today for initial office follow-up visit following hospital admission for stroke in March 2021.  She is accompanied by her daughter.  History is obtained from them, review of electronic medical records and I personally reviewed imaging films in PACS.Marland Kitchen She has a past medical history of COPD, gastroesophageal reflux disease, psoriasis, chronic smoking, asthma, chronic back pain.  She presented to [facility] on [facility]/03[facility]2019 with sudden onset of right-sided neck pain, dizziness, difficulty walking and imbalance along with some nausea and vomiting day prior to admission.  She denied any preceding neck injury, fall or motor vehicle accident.  CT scan of the head showed possible thrombus in the right vertebral artery.  CT angiogram showed occluded right distal vertebral artery in the V3/V4 segment.  MRI scan of the brain showed punctate right medullary as well as right cerebellar infarct with old infarcts in the left cerebellum.  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  2D echo showed normal ejection fraction without cardiac source of embolism.  LDL cholesterol is 146 mg percent.  Hemoglobin A1c was 5.7.  HIV was negative.  Patient was started on dual antiplatelet therapy aspirin Plavix and advised to quit smoking.  She has appointment to have outpatient loop recorder placed next Tuesday by Dr. Lovena Le.  She states that her dizziness and imbalance have improved significantly however she is complaining of headaches which are now daily.  She describes these as bitemporal headaches moderate in severity without any nausea vomiting light or sound sensitivity.  She takes up to 2 to 4 tablets of Tylenol daily which  provides only short-term relief.  She has cut back smoking but still smokes 2 cigarettes/day.  She is tolerating aspirin and Plavix well without bruising bleeding or other side effects.  She is also on Lipitor and tolerating it well without muscle aches and pains.  She has had no recurrent stroke or TIA symptoms.  ROS:   14 system review of systems is positive for headache, neck pain, dizziness, imbalance all other systems negative  PMH:  Past Medical History:  Diagnosis Date  . Abdominal pain, epigastric 02/23/2013  . Abdominal pain, unspecified site 08/15/2013  . Abnormal neurological exam 04/11/2016  . Acute CVA (cerebrovascular accident) ([place]) 11/03/2019  . Asthma   . Cataracts, bilateral 05/03/2014  . Chronic bronchitis ([place])   . Chronic lower back pain   . COPD (chronic obstructive pulmonary disease) ([place])   . Daily headache   . De Quervain's tenosynovitis, left 11/09/2014  . GERD (gastroesophageal reflux disease)   . Hx of colonic polyps   . Nausea & vomiting   . PSORIASIS 12/29/2006   Qualifier: Diagnosis of  By: Jobe Igo MD, Shanon Brow    . Right leg pain 04/02/2016  . Smoker     Social History:  Social History   Socioeconomic History  . Marital status: Single    Spouse name: Not on file  . Number of children: Not on file  . Years of education: Not on file  . Highest education level: Not on file  Occupational History  . Not on file  Tobacco Use  . Smoking status: Current Every Day Smoker    Packs/day: 0.50  Years: 47.00    Pack years: 23.50    Types: Cigarettes  . Smokeless tobacco: Never Used  . Tobacco comment: 3-4 cigarettes per day  Substance and Sexual Activity  . Alcohol use: Not Currently    Alcohol/week: 0.0 standard drinks    Comment: Quit alcohol in 1992; "never really drank much alcohol"  . Drug use: Yes    Types: Marijuana    Comment: 02/10/2018 "~ twice//wk"  . Sexual activity: Not Currently  Other Topics Concern  . Not on file  Social History Narrative    . Not on file   Social Determinants of Health   Financial Resource Strain:   . Difficulty of Paying Living Expenses:   Food Insecurity:   . Worried About Charity fundraiser in the Last Year:   . Arboriculturist in the Last Year:   Transportation Needs:   . Film/video editor (Medical):   Marland Kitchen Lack of Transportation (Non-Medical):   Physical Activity:   . Days of Exercise per Week:   . Minutes of Exercise per Session:   Stress:   . Feeling of Stress :   Social Connections:   . Frequency of Communication with Friends and Family:   . Frequency of Social Gatherings with Friends and Family:   . Attends Religious Services:   . Active Member of Clubs or Organizations:   . Attends Archivist Meetings:   Marland Kitchen Marital Status:   Intimate Partner Violence:   . Fear of Current or Ex-Partner:   . Emotionally Abused:   Marland Kitchen Physically Abused:   . Sexually Abused:     Medications:   Current Outpatient Medications on File Prior to Visit  Medication Sig Dispense Refill  . amLODipine (NORVASC) 5 MG tablet Take 1 tablet (5 mg total) by mouth daily. 90 tablet 0  . aspirin EC 81 MG tablet Take 1 tablet (81 mg total) by mouth daily. 90 tablet 3  . atorvastatin (LIPITOR) 80 MG tablet Take 1 tablet (80 mg total) by mouth daily at 6 PM. 90 tablet 3  . clopidogrel (PLAVIX) 75 MG tablet Take 1 tablet (75 mg total) by mouth daily. 30 tablet 2  . promethazine (PHENERGAN) 12.5 MG tablet Take 1 tablet (12.5 mg total) by mouth every 8 (eight) hours as needed for nausea or vomiting. 10 tablet 0  . VENTOLIN HFA 108 (90 Base) MCG/ACT inhaler Inhale 2 puffs into the lungs every 4 (four) hours as needed for wheezing or shortness of breath. 18 g 1   No current facility-administered medications on file prior to visit.    Allergies:  No Known Allergies  Physical Exam General: Frail middle-aged African-American lady, seated, in no evident distress Head: head normocephalic and atraumatic.  Neck: supple  with no carotid or supraclavicular bruits Cardiovascular: regular rate and rhythm, no murmurs Musculoskeletal: no deformity but prominent stiffness of posterior neck and upper shoulder muscle with slight restriction of movements. Skin:  no rash/petichiae Vascular:  Normal pulses all extremities Vitals:   12/06/19 1409  BP: 134/82  Pulse: 76  Temp: (!) 97.5 F (36.4 C)   Neurologic Exam Mental Status: Awake and fully alert. Oriented to place and time. Recent and remote memory intact. Attention span, concentration and fund of knowledge appropriate. Mood and affect appropriate.  Cranial Nerves: Fundoscopic exam reveals sharp disc margins. Pupils equal, briskly reactive to light. Extraocular movements full without nystagmus. Visual fields full to confrontation. Hearing intact. Facial sensation intact. Face, tongue, palate moves  normally and symmetrically.  Motor: Normal bulk and tone. Normal strength in all tested extremity muscles. Sensory.: intact to touch ,pinprick .position and vibratory sensation.  Coordination: Rapid alternating movements normal in all extremities. Finger-to-nose and heel-to-shin performed accurately bilaterally. Gait and Station: Arises from chair without difficulty. Stance is normal. Gait demonstrates normal stride length and balance . Able to heel, toe and tandem walk with mild difficulty.  Reflexes: 1+ and symmetric. Toes downgoing.   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  Vascular risk factors of smoking, hypertension, hyperlipidemia     PLAN: I had a long d/w patient and her daughter about her recent stroke, right vertebral artery occlusion,risk for recurrent stroke/TIAs, personally independently reviewed imaging studies and stroke evaluation results and answered questions.Continue aspirin and Plavix for 2 more  months and then discontinue aspirin and stay on Plavix alone for secondary stroke prevention and maintain strict control of hypertension with blood pressure goal below 130/90, diabetes with hemoglobin A1c goal below 6.5% and lipids with LDL cholesterol goal below 70 mg/dL. I also advised the patient to eat a healthy diet with plenty of whole grains, cereals, fruits and vegetables, exercise regularly and maintain ideal body weight.  She was advised to quit smoking completely.  She was advised to keep her upcoming appointment for loop recorder insertion next week.  I recommend she do regular neck stretching exercises for her tension headaches and trial of Depakote ER 500 mg daily for headache.  She was cautioned to reduce Tylenol usage to avoid analgesic rebound headaches.  Followup in the future with my nurse practitioner Janett Billow in 3 months or call earlier if necessary. Greater than 50% of time during this 25 minute visit was spent on counseling,explanation of diagnosis of stroke, vertebral artery occlusion, planning of further management, discussion with patient and family and coordination of care Antony Contras, MD  [facility] [address] [place], [place] [address]  Phone [phone] Fax [phone] Note: This document was prepared with digital dictation and possible smart phrase technology. Any transcriptional errors that result from this process are unintentional

## 2019-12-08 ENCOUNTER — Encounter: Payer: Self-pay | Admitting: *Deleted

## 2019-12-08 ENCOUNTER — Other Ambulatory Visit: Payer: Self-pay | Admitting: *Deleted

## 2019-12-08 ENCOUNTER — Telehealth: Payer: Self-pay | Admitting: *Deleted

## 2019-12-08 ENCOUNTER — Telehealth: Payer: Self-pay

## 2019-12-08 DIAGNOSIS — I639 Cerebral infarction, unspecified: Secondary | ICD-10-CM

## 2019-12-08 NOTE — Telephone Encounter (Signed)
Per precert-request for loop implant denied by Pt's insurance because Pt has not worn an external heart monitor.  Per Dr. Corliss Parish to order 30 day monitor.  Called placed to Pt.  Advised of above.  Advised upcoming appt with Dr. Lovena Le would be cancelled and will order 30 day monitor.  F/u will be based on those results.  Pt indicates understanding.

## 2019-12-08 NOTE — Telephone Encounter (Signed)
Patient enrolled for [employer] to ship a 30 day cardiac event monitor to her home.  Instructions will be mailed to her home and will also be included in her monitor kit.

## 2019-12-13 ENCOUNTER — Ambulatory Visit: Payer: Medicare HMO | Admitting: Internal Medicine

## 2019-12-19 ENCOUNTER — Ambulatory Visit: Payer: Medicare HMO

## 2019-12-20 ENCOUNTER — Ambulatory Visit: Payer: Medicare HMO

## 2019-12-20 ENCOUNTER — Ambulatory Visit: Payer: Medicare HMO | Admitting: Family Medicine

## 2019-12-21 ENCOUNTER — Ambulatory Visit: Payer: Medicare HMO

## 2019-12-26 ENCOUNTER — Encounter: Payer: Self-pay | Admitting: Family Medicine

## 2019-12-26 ENCOUNTER — Telehealth (INDEPENDENT_AMBULATORY_CARE_PROVIDER_SITE_OTHER): Payer: Medicare HMO | Admitting: Family Medicine

## 2019-12-26 ENCOUNTER — Other Ambulatory Visit: Payer: Self-pay

## 2019-12-26 DIAGNOSIS — Z720 Tobacco use: Secondary | ICD-10-CM

## 2019-12-26 NOTE — Progress Notes (Signed)
Patient initially scheduled for follow-up on 4/26.  Had open note encounter in preparation for visit, however was rescheduled the day of appointment for 5/16 instead.  [name], DO

## 2020-01-16 ENCOUNTER — Telehealth (INDEPENDENT_AMBULATORY_CARE_PROVIDER_SITE_OTHER): Payer: Medicare HMO | Admitting: Family Medicine

## 2020-01-16 ENCOUNTER — Other Ambulatory Visit: Payer: Self-pay

## 2020-01-16 DIAGNOSIS — Z720 Tobacco use: Secondary | ICD-10-CM

## 2020-01-16 NOTE — Progress Notes (Signed)
Scheduled for telemedicine video visit today at 2:10 PM. Call patient with no answer and left voicemail. Additionally started MyChart video encounter and sent text message with no response for 15 min.  Recommend rescheduling at her convenience if she calls the clinic later this week (did not call today following no-show).   [name], DO

## 2020-01-16 NOTE — Progress Notes (Signed)
Attempted to reach pt to go over check in questions. No answer. LVM. Salvatore Marvel, CMA

## 2020-01-24 ENCOUNTER — Encounter: Payer: Self-pay | Admitting: Adult Health

## 2020-01-31 ENCOUNTER — Other Ambulatory Visit: Payer: Self-pay

## 2020-01-31 ENCOUNTER — Other Ambulatory Visit: Payer: Self-pay | Admitting: *Deleted

## 2020-01-31 MED ORDER — CLOPIDOGREL BISULFATE 75 MG PO TABS: 75.0000 mg | ORAL_TABLET | Freq: Every day | ORAL | 0 refills | Status: DC

## 2020-02-01 NOTE — Telephone Encounter (Signed)
Received notification from [employer] that the pt has requested cancellation of monitor... Order will be cancelled. 

## 2020-03-06 ENCOUNTER — Ambulatory Visit: Payer: Medicare HMO | Admitting: Adult Health

## 2020-03-16 ENCOUNTER — Other Ambulatory Visit: Payer: Self-pay

## 2020-03-16 DIAGNOSIS — R1115 Cyclical vomiting syndrome unrelated to migraine: Secondary | ICD-10-CM

## 2020-03-16 MED ORDER — PROMETHAZINE HCL 12.5 MG PO TABS: 12.5000 mg | ORAL_TABLET | Freq: Three times a day (TID) | ORAL | 0 refills | Status: DC | PRN

## 2020-05-10 ENCOUNTER — Other Ambulatory Visit: Payer: Self-pay | Admitting: Family Medicine

## 2020-07-11 ENCOUNTER — Other Ambulatory Visit: Payer: Self-pay | Admitting: Neurology

## 2020-07-21 ENCOUNTER — Other Ambulatory Visit: Payer: Self-pay | Admitting: Family Medicine

## 2020-07-21 DIAGNOSIS — J449 Chronic obstructive pulmonary disease, unspecified: Secondary | ICD-10-CM

## 2020-08-16 ENCOUNTER — Ambulatory Visit (INDEPENDENT_AMBULATORY_CARE_PROVIDER_SITE_OTHER): Payer: Medicare HMO

## 2020-08-16 DIAGNOSIS — Z0000 Encounter for general adult medical examination without abnormal findings: Secondary | ICD-10-CM

## 2020-08-16 NOTE — Progress Notes (Addendum)
Subjective:   [name] is a 69 y.o. female who presents for Medicare Annual (Subsequent) preventive examination.  Patient consented to have virtual visit and was identified by name and date of birth. Method of visit: Telephone  Encounter participants: Patient: [name] - located at Home Nurse/Provider: Dorna Bloom - located at [facility] Others (if applicable): NA  Review of Systems: Defer to PCP.  Cardiac Risk Factors include: advanced age (>45men, >32 women);smoking/ tobacco exposure;sedentary lifestyle  Objective:   Vitals: There were no vitals taken for this visit.  There is no height or weight on file to calculate BMI.  Advanced Directives 08/16/2020 11/23/2019 11/05/2019 06/11/2018 05/21/2018 04/19/2018 03/26/2018  Does Patient Have a Medical Advance Directive? No No No No No No No  Would patient like information on creating a medical advance directive? Yes (MAU/Ambulatory/Procedural Areas - Information given) No - Patient declined No - Patient declined - No - Patient declined No - Patient declined Yes (MAU/Ambulatory/Procedural Areas - Information given)  Pre-existing out of facility DNR order (yellow form or pink MOST form) - - - - - - -   Tobacco Social History   Tobacco Use  Smoking Status Current Every Day Smoker  . Packs/day: 0.50  . Years: 47.00  . Pack years: 23.50  . Types: Cigarettes  Smokeless Tobacco Never Used  Tobacco Comment   3-4 cigarettes per day     Ready to quit: No Counseling given: Yes Comment: 3-4 cigarettes per day  Clinical Intake:  Pre-visit preparation completed: Yes  How often do you need to have someone help you when you read instructions, pamphlets, or other written materials from your doctor or pharmacy?: 2 - Rarely What is the last grade level you completed in school?: high school  Interpreter Needed?: No  Past Medical History:  Diagnosis Date  . Abdominal pain, epigastric 02/23/2013  . Abdominal pain, unspecified site  08/15/2013  . Abnormal neurological exam 04/11/2016  . Acute CVA (cerebrovascular accident) ([place]) 11/03/2019  . Acute non-recurrent frontal sinusitis 05/21/2018  . Asthma   . Cataracts, bilateral 05/03/2014  . Cephalalgia   . Chronic bronchitis ([place])   . Chronic lower back pain   . COPD (chronic obstructive pulmonary disease) ([place])   . Daily headache   . De Quervain's tenosynovitis, left 11/09/2014  . GERD (gastroesophageal reflux disease)   . Hx of colonic polyps   . Maxillary sinusitis   . Nausea & vomiting   . Orthopnea 09/28/2015   Reports of 5 pillow orthopnea (from 2-3) which began with cold symptoms. Could be due to cold but would like to rule out cardiac etiology. Also noted of 1 year history of dyspnea on exertion which only worsens when she gets a cold; this could be due to COPD however cardiac etiology should be considered. No JVD or LE edema on exam.  - ECHO ordered - will refer to cardiology if ECHO concerning for H  . PSORIASIS 12/29/2006   Qualifier: Diagnosis of  By: Jobe Igo MD, Shanon Brow    . Right leg pain 04/02/2016  . Smoker    Past Surgical History:  Procedure Laterality Date  . ABDOMINAL AORTOGRAM W/LOWER EXTREMITY N/A 10/06/2016   Procedure: Abdominal Aortogram w/Lower Extremity;  Surgeon: Waynetta Sandy, MD;  Location: [facility];  Service: Cardiovascular;  Laterality: N/A;  . ABDOMINAL AORTOGRAM W/LOWER EXTREMITY N/A 02/10/2018   Procedure: ABDOMINAL AORTOGRAM W/LOWER EXTREMITY;  Surgeon: Waynetta Sandy, MD;  Location: [facility];  Service: Cardiovascular;  Laterality: N/A;  . ABDOMINAL HERNIA REPAIR    . APPENDECTOMY    . COLONOSCOPY W/ POLYPECTOMY  02/26/2005  . FEMORAL-POPLITEAL BYPASS GRAFT Left 04/20/2018   Procedure: Left FEMORAL-POPLITEAL ARTERY Bypass Graft using Gore Propaten Vascular Graft;  Surgeon: Waynetta Sandy, MD;  Location: [facility];  Service: Vascular;  Laterality: Left;  . HERNIA REPAIR    . LOWER EXTREMITY  ANGIOGRAPHY Bilateral 04/19/2018   Procedure: LOWER EXTREMITY ANGIOGRAPHY;  Surgeon: Waynetta Sandy, MD;  Location: [facility];  Service: Cardiovascular;  Laterality: Bilateral;  . PERIPHERAL VASCULAR ATHERECTOMY Right 10/06/2016   Procedure: Peripheral Vascular Atherectomy;  Surgeon: Waynetta Sandy, MD;  Location: [facility];  Service: Cardiovascular;  Laterality: Right;  . PERIPHERAL VASCULAR INTERVENTION Right 10/06/2016   Procedure: Peripheral Vascular Intervention;  Surgeon: Waynetta Sandy, MD;  Location: [facility];  Service: Cardiovascular;  Laterality: Right;  SFA  . PERIPHERAL VASCULAR INTERVENTION Left 02/10/2018   Procedure: PERIPHERAL VASCULAR INTERVENTION;  Surgeon: Waynetta Sandy, MD;  Location: [facility];  Service: Cardiovascular;  Laterality: Left;  . TONSILLECTOMY  1960s  . UPPER GASTROINTESTINAL ENDOSCOPY  03/22/2010, 02/13/2005  . VAGINAL HYSTERECTOMY     partial   Family History  Problem Relation Age of Onset  . Cancer Other   . Coronary artery disease Other   . Cancer Mother   . Hypertension Mother   . Heart disease Mother   . Cancer Father   . Hypertension Father   . Cancer Sister   . Hypertension Sister    Social History   Socioeconomic History  . Marital status: Single    Spouse name: Not on file  . Number of children: 2  . Years of education: 32  . Highest education level: High school graduate  Occupational History  . Not on file  Tobacco Use  . Smoking status: Current Every Day Smoker    Packs/day: 0.50    Years: 47.00    Pack years: 23.50    Types: Cigarettes  . Smokeless tobacco: Never Used  . Tobacco comment: 3-4 cigarettes per day  Vaping Use  . Vaping Use: Never used  Substance and Sexual Activity  . Alcohol use: Not Currently    Alcohol/week: 0.0 standard drinks    Comment: Quit alcohol in 1992; "never really drank much alcohol"  . Drug use: Yes    Types: Marijuana     Comment: 02/10/2018 "~ twice//wk"  . Sexual activity: Not Currently  Other Topics Concern  . Not on file  Social History Narrative   Patient lives alone in [place].    Patient has two adult children nearby.    Patient has never been married.    Patient enjoys riding motorcycles and she is part of a "biker club."   Patient enjoys shopping and going to sporting events.   Social Determinants of Health   Financial Resource Strain: Low Risk   . Difficulty of Paying Living Expenses: Not hard at all  Food Insecurity: No Food Insecurity  . Worried About Charity fundraiser in the Last Year: Never true  . Ran Out of Food in the Last Year: Never true  Transportation Needs: No Transportation Needs  . Lack of Transportation (Medical): No  . Lack of Transportation (Non-Medical): No  Physical Activity: Inactive  . Days of Exercise per Week: 0 days  . Minutes of Exercise per Session: 0 min  Stress: No Stress Concern Present  . Feeling of  Stress : Only a little  Social Connections: Moderately Integrated  . Frequency of Communication with Friends and Family: More than three times a week  . Frequency of Social Gatherings with Friends and Family: More than three times a week  . Attends Religious Services: More than 4 times per year  . Active Member of Clubs or Organizations: Yes  . Attends Archivist Meetings: 1 to 4 times per year  . Marital Status: Never married   Outpatient Encounter Medications as of 08/16/2020  Medication Sig  . albuterol (VENTOLIN HFA) 108 (90 Base) MCG/ACT inhaler INHALE 2 PUFFS INTO THE LUNGS EVERY 4 HOURS AS NEEDED FOR WHEEZING OR SHORTNESS OF BREATH  . amLODipine (NORVASC) 5 MG tablet Take 1 tablet (5 mg total) by mouth daily.  Marland Kitchen aspirin EC 81 MG tablet Take 1 tablet (81 mg total) by mouth daily.  Marland Kitchen atorvastatin (LIPITOR) 80 MG tablet Take 1 tablet (80 mg total) by mouth daily at 6 PM.  . divalproex (DEPAKOTE ER) 500 MG 24 hr tablet Take 1 tablet (500 mg  total) by mouth daily.  . promethazine (PHENERGAN) 12.5 MG tablet Take 1 tablet (12.5 mg total) by mouth every 8 (eight) hours as needed for nausea or vomiting.  . clopidogrel (PLAVIX) 75 MG tablet Take 1 tablet (75 mg total) by mouth daily. 75mg  aspirin only after finishing this. (Patient not taking: Reported on 08/16/2020)   No facility-administered encounter medications on file as of 08/16/2020.   Activities of Daily Living In your present state of health, do you have any difficulty performing the following activities: 08/16/2020 11/05/2019  Hearing? N N  Vision? N N  Difficulty concentrating or making decisions? N N  Walking or climbing stairs? N N  Dressing or bathing? N N  Doing errands, shopping? N N  Preparing Food and eating ? N -  Using the Toilet? N -  In the past six months, have you accidently leaked urine? N -  Do you have problems with loss of bowel control? N -  Managing your Medications? N -  Managing your Finances? N -  Housekeeping or managing your Housekeeping? N -  Some recent data might be hidden   Patient Care Team: [name], DO as PCP - General (Family Medicine) Belva Crome, MD as Consulting Physician (Cardiology)    Assessment:   This is a routine wellness examination for [name].  Exercise Activities and Dietary recommendations Current Exercise Habits: The patient does not participate in regular exercise at present  Goals    . Quit Smoking     Smokes 3-4 cigarettes a day.  Quit line information given.  Apt with PCP made.      Fall Risk Fall Risk  08/16/2020 11/23/2019 11/08/2019 05/21/2018 10/19/2017  Falls in the past year? 0 1 1 No No  Number falls in past yr: - 0 0 - -  Injury with Fall? - 1 - - -  Follow up Falls prevention discussed - Falls evaluation completed - -   Is the patient's home free of loose throw rugs in walkways, pet beds, electrical cords, etc?   yes      Grab bars in the bathroom? yes      Handrails on the stairs?   no-  no stairs       Adequate lighting?   yes  Patient rating of health (0-10) scale: 8  Depression Screen PHQ 2/9 Scores 08/16/2020 08/16/2020 11/23/2019 11/08/2019  PHQ - 2 Score 0  0 0 0  PHQ- 9 Score - - - -    Cognitive Function  6CIT Screen 08/16/2020  What Year? 0 points  What month? 0 points  What time? 0 points  Count back from 20 0 points  Months in reverse 0 points  Repeat phrase 0 points  Total Score 0   Immunization History  Administered Date(s) Administered  . Influenza Split 07/16/2012  . Influenza,inj,Quad PF,6+ Mos 05/12/2013, 05/16/2014, 05/28/2015  . Influenza-Unspecified 06/01/2017  . Pneumococcal Polysaccharide-23 03/14/2015  . Zoster Recombinat (Shingrix) 04/06/2017   Screening Tests Health Maintenance  Topic Date Due  . COVID-19 Vaccine (1) Never done  . TETANUS/TDAP  Never done  . COLONOSCOPY  Never done  . MAMMOGRAM  05/11/2015  . DEXA SCAN  Never done  . PNA vac Low Risk Adult (1 of 2 - PCV13) 01/03/2018  . INFLUENZA VACCINE  04/01/2020  . Hepatitis C Screening  Completed   Cancer Screenings: Lung: Low Dose CT Chest recommended if Age 32-80 years, 30 pack-year currently smoking OR have quit w/in 15years. Patient does qualify. Breast:  Up to date on Mammogram? No   Up to date of Bone Density/Dexa? No Colorectal: No  Discuss the importance of health maintenance with patient.  Additional Screenings: Hepatitis C Screening: Completed  HIV Screening: Completed  Plan:  PCP apt for 09/12/2020 at [place] out an advance directive packet.  Consider health maintenance topics we discussed.  Happy Holidays!  I have personally reviewed and noted the following in the patient's chart:   . Medical and social history . Use of alcohol, tobacco or illicit drugs  . Current medications and supplements . Functional ability and status . Nutritional status . Physical activity . Advanced directives . List of other physicians . Hospitalizations, surgeries,  and ER visits in previous 12 months . Vitals . Screenings to include cognitive, depression, and falls . Referrals and appointments  In addition, I have reviewed and discussed with patient certain preventive protocols, quality metrics, and best practice recommendations. A written personalized care plan for preventive services as well as general preventive health recommendations were provided to patient.  This visit was conducted virtually in the setting of the [place] pandemic.    Dorna Bloom, Center  08/16/2020    I have reviewed this visit and agree with the documentation.   [name], DO

## 2020-08-16 NOTE — Patient Instructions (Signed)
You spoke to [name], [place] over the phone for your annual wellness visit.  We discussed goals: Goals    . Quit Smoking     Smokes 3-4 cigarettes a day.  Quit line information given.  Apt with PCP made.      We also discussed recommended health maintenance. As discussed, you are due for the following: We have scheduled you a PCP apt for January 2022 to address health maintenance and Covid Vaccine.  Health Maintenance  Topic Date Due  . COVID-19 Vaccine (1) Never done  . TETANUS/TDAP  Never done  . COLONOSCOPY  Never done  . MAMMOGRAM  05/11/2015  . DEXA SCAN  Never done  . PNA vac Low Risk Adult (1 of 2 - PCV13) 01/03/2018  . INFLUENZA VACCINE  04/01/2020  . Hepatitis C Screening  Completed   PCP apt for 09/12/2020 at 2pm. Fill out an advance directive packet.  Consider health maintenance topics we discussed.  Happy Holidays!  We also discussed smoking cessation.  1-800-Quit-Now   Preventive Care 2 Years and Older, Female Preventive care refers to lifestyle choices and visits with your health care provider that can promote health and wellness. This includes:  A yearly physical exam. This is also called an annual well check.  Regular dental and eye exams.  Immunizations.  Screening for certain conditions.  Healthy lifestyle choices, such as diet and exercise. What can I expect for my preventive care visit? Physical exam Your health care provider will check:  Height and weight. These may be used to calculate body mass index (BMI), which is a measurement that tells if you are at a healthy weight.  Heart rate and blood pressure.  Your skin for abnormal spots. Counseling Your health care provider may ask you questions about:  Alcohol, tobacco, and drug use.  Emotional well-being.  Home and relationship well-being.  Sexual activity.  Eating habits.  History of falls.  Memory and ability to understand (cognition).  Work and work  Statistician.  Pregnancy and menstrual history. What immunizations do I need?  Influenza (flu) vaccine  This is recommended every year. Tetanus, diphtheria, and pertussis (Tdap) vaccine  You may need a Td booster every 10 years. Varicella (chickenpox) vaccine  You may need this vaccine if you have not already been vaccinated. Zoster (shingles) vaccine  You may need this after age 57. Pneumococcal conjugate (PCV13) vaccine  One dose is recommended after age 75. Pneumococcal polysaccharide (PPSV23) vaccine  One dose is recommended after age 62. Measles, mumps, and rubella (MMR) vaccine  You may need at least one dose of MMR if you were born in 1957 or later. You may also need a second dose. Meningococcal conjugate (MenACWY) vaccine  You may need this if you have certain conditions. Hepatitis A vaccine  You may need this if you have certain conditions or if you travel or work in places where you may be exposed to hepatitis A. Hepatitis B vaccine  You may need this if you have certain conditions or if you travel or work in places where you may be exposed to hepatitis B. Haemophilus influenzae type b (Hib) vaccine  You may need this if you have certain conditions. You may receive vaccines as individual doses or as more than one vaccine together in one shot (combination vaccines). Talk with your health care provider about the risks and benefits of combination vaccines. What tests do I need? Blood tests  Lipid and cholesterol levels. These may be checked  every 5 years, or more frequently depending on your overall health.  Hepatitis C test.  Hepatitis B test. Screening  Lung cancer screening. You may have this screening every year starting at age 33 if you have a 30-pack-year history of smoking and currently smoke or have quit within the past 15 years.  Colorectal cancer screening. All adults should have this screening starting at age 84 and continuing until age 40. Your  health care provider may recommend screening at age 47 if you are at increased risk. You will have tests every 1-10 years, depending on your results and the type of screening test.  Diabetes screening. This is done by checking your blood sugar (glucose) after you have not eaten for a while (fasting). You may have this done every 1-3 years.  Mammogram. This may be done every 1-2 years. Talk with your health care provider about how often you should have regular mammograms.  BRCA-related cancer screening. This may be done if you have a family history of breast, ovarian, tubal, or peritoneal cancers. Other tests  Sexually transmitted disease (STD) testing.  Bone density scan. This is done to screen for osteoporosis. You may have this done starting at age 19. Follow these instructions at home: Eating and drinking  Eat a diet that includes fresh fruits and vegetables, whole grains, lean protein, and low-fat dairy products. Limit your intake of foods with high amounts of sugar, saturated fats, and salt.  Take vitamin and mineral supplements as recommended by your health care provider.  Do not drink alcohol if your health care provider tells you not to drink.  If you drink alcohol: ? Limit how much you have to 0-1 drink a day. ? Be aware of how much alcohol is in your drink. In the [place].S., one drink equals one 12 oz bottle of beer (355 mL), one 5 oz glass of wine (148 mL), or one 1 oz glass of hard liquor (44 mL). Lifestyle  Take daily care of your teeth and gums.  Stay active. Exercise for at least 30 minutes on 5 or more days each week.  Do not use any products that contain nicotine or tobacco, such as cigarettes, e-cigarettes, and chewing tobacco. If you need help quitting, ask your health care provider.  If you are sexually active, practice safe sex. Use a condom or other form of protection in order to prevent STIs (sexually transmitted infections).  Talk with your health care provider  about taking a low-dose aspirin or statin. What's next?  Go to your health care provider once a year for a well check visit.  Ask your health care provider how often you should have your eyes and teeth checked.  Stay up to date on all vaccines. This information is not intended to replace advice given to you by your health care provider. Make sure you discuss any questions you have with your health care provider. Document Revised: 08/12/2018 Document Reviewed: 08/12/2018 Elsevier Patient Education  2020 [facility]'s number is [phone]. Please call with questions or concerns about what we discussed today.

## 2020-09-12 ENCOUNTER — Encounter: Payer: Self-pay | Admitting: Family Medicine

## 2020-09-12 ENCOUNTER — Other Ambulatory Visit: Payer: Self-pay

## 2020-09-12 ENCOUNTER — Ambulatory Visit (INDEPENDENT_AMBULATORY_CARE_PROVIDER_SITE_OTHER): Payer: 59 | Admitting: Family Medicine

## 2020-09-12 VITALS — BP 115/80 | HR 81 | Ht 66.0 in | Wt 126.8 lb

## 2020-09-12 DIAGNOSIS — Z23 Encounter for immunization: Secondary | ICD-10-CM

## 2020-09-12 DIAGNOSIS — R634 Abnormal weight loss: Secondary | ICD-10-CM | POA: Diagnosis not present

## 2020-09-12 DIAGNOSIS — J432 Centrilobular emphysema: Secondary | ICD-10-CM

## 2020-09-12 DIAGNOSIS — F322 Major depressive disorder, single episode, severe without psychotic features: Secondary | ICD-10-CM | POA: Diagnosis not present

## 2020-09-12 DIAGNOSIS — J449 Chronic obstructive pulmonary disease, unspecified: Secondary | ICD-10-CM

## 2020-09-12 MED ORDER — TRELEGY ELLIPTA 100-62.5-25 MCG/INH IN AEPB: 1.0000 | INHALATION_SPRAY | Freq: Every day | RESPIRATORY_TRACT | 3 refills | Status: AC

## 2020-09-12 MED ORDER — ALBUTEROL SULFATE HFA 108 (90 BASE) MCG/ACT IN AERS: INHALATION_SPRAY | RESPIRATORY_TRACT | 1 refills | Status: DC

## 2020-09-12 MED ORDER — ESCITALOPRAM OXALATE 5 MG PO TABS: 5.0000 mg | ORAL_TABLET | Freq: Every day | ORAL | 0 refills | Status: DC

## 2020-09-12 NOTE — Patient Instructions (Addendum)
It was great to see you today, I am so sorry that you are struggling with your housing recently. I have sent a medication called Lexapro to help with your mood, you will take this once daily with food. If you feel that you are tolerating this medication well after a week, you can increase it to 2 tablets 10 mg total. I encourage you to reach out to your family and friends for additional support. We will check some labs today, I will call you with these results. I will also place a referral to the stomach doctors.  I have refilled your COPD inhalers, please schedule your albuterol every 4-6 hours for the next 1-2 days and slowly back off as you feel your breathing has improved. If you still do not feel like your breathing is better, please let me know and I can send in a steroid course if needed.  For sleep, you can try melatonin 5 mg 2 hours prior to bedtime.   Therapy and Counseling Resources Most providers on this list will take Medicaid. Patients with commercial insurance or Medicare should contact their insurance company to get a list of in network providers.  BestDay:Psychiatry and Counseling [address] [facility] [place]. [place], [place] [address] [place]  [address], [place], [place] [address]      [place] [address]  [place], [place] [address] [phone]  [place] [address] [place]  [place], [place] [address]       [phone]      Jinny Blossom [facility] [address], [place], [place]  Family Solutions:  [place]. [place] [phone]  [facility]:  [place] [address] [place] [phone]  [facility] (under & uninsured) [address], [place] [phone]    [email or web address]    [place] [address] . [place]     [phone]  [facility] [place]     Phone:  [phone]     [place]  [place] [address]      [place], [place] ext [place]: [place], [place], [place]   [place] ([place] therapist) [email or web address]  [place] [address]   [place] [address]    [phone]    [facility]   [address],  [place], [place]   [place]  [place]  [facility]  [address] [place], [place]        [phone]  Open Access/Walk In Clinic under & uninsured  [facility]  [address] [place], [place] Crisis [phone]  Family Service of the [place],  ([place])   [place], [place]: [phone]) 8:30 - 12; 1 - 2:30  Family Service of the [place],  [place], [place]    [phone]):8:30 - 12; 2 - 3PM  [facility] [employer],  [address],  [place]; [phone]):   Mon - Fri 8 AM - 5 PM  Al[facility] Drug Services [place] [address]  MWF 12:30 to 3:00 or  call to schedule an appointment  [phone]  Specific Provider options Psychology Today  https://www.psychologytoday.com/us 1. click on find a therapist  2. enter your zip code 3. left side and select or tailor a therapist for your specific need.   [facility] Provider Directory [email or web address]  (Medicaid)   Follow all drop down to find a provider  [place] [phone] or [email or web address] [address], [place], [place] Recovery support and educational   24- Hour Availability:  .  Marland [facility] [place]  . [place],  [place] Crisis [phone]  . Family Service of the [employer] [phone]  [facility] Crisis Service  [phone]   . [facility]  [phone] (after hours)  . Therapeutic Alternative/Mobile Crisis   [phone]  . [place] National Suicide Hotline  (848)809-0303 ([place])  . Call 911 or go to emergency room  . [employer]  904-751-2963);  [place] and [employer]   . [employer] ACCESS  3202923617); , [place], [place], [place], [place], [place], [place]    Family Medicine [employer] Sheet  Housing Resources: Marland Kitchen [employer]:   www.gha-[place].org/  [phone] [place], [place], [place] [address]  . [place] Housing Medical sales representative.NCHousingSearch.org   ([phone] . [place]:  [phone]     [place], [place], [place] [address]    [place]:  [phone]     [place], [place], [place] [address]   . Affordable Housing Management:  [phone]  http://www.CreditChaos.com.ee.cfm  [address] [place] [address] [place], [place] [address] . Homeless: Museum/gallery curator [facility]) [phone]   [address] [place]   . [facility]: [place] By appointment only - Call [phone]  . [employer] (GUM)   [place] [phone] . Pathways   [phone] . [place]  [phone] Housing Repair . [employer] (Research scientist (medical))  www.[email or web address].com.au  [place].  Quintella Reichert [place], [place] [address]   [phone]  (For homeowners only) . [employer]  [phone] and older)  [phone] ext [phone]

## 2020-09-12 NOTE — Progress Notes (Unsigned)
SUBJECTIVE:   CHIEF COMPLAINT / HPI: check in/weight loss   Ms. [name] is a 70 year old female presenting discussed the following:  Weight loss: She reports unexpected weight loss over the past 1-2 months, noticed that her face looks thinner in the mirror and that her clothes are more loose.  In discussion, she also reports that she has been severely depressed since November after being told she needs to vacate from her rented house due to needing so many repairs to her house.  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  She is currently working with her Education officer, museum to find new housing however they have not found anything yet, but fortunately they have been able to extend her stay in her current apartment until the beginning of February.  She has a very poor appetite and has not been eating or really sleeping.  She otherwise denies any melena/hematochezia, night sweats, fever, abdominal pain, recent recurrent vomiting (does have bouts of cyclic vomiting as below), rash, or chest pain.  Does have a family history of colon cancer in her mother and sister, last colonoscopy in 2014 with multiple adenomas.  Referred to GI last year however she never heard from them.   [place] Office Visit from 09/12/2020 in [place] Office Visit from 09/14/2017 in [place]  Thoughts that you would be better off dead, or of hurting yourself in some way Not at all Not at all  PHQ-9 Total Score 24 9     COPD: Has been out of her Trelegy inhaler for the past few weeks and has been requiring her albuterol every day.  She was told by her pharmacy that there was no more refills so that she thought she could not get any more.  She reports increased dry cough over the past several weeks without any associated fever, myalgias, loss of taste/smell, congestion.  Occasionally feeling short of breath resolved with  albuterol.  She has not heard any wheezing.  PERTINENT  PMH / PSH: COPD, PAD, GERD, cyclic vomiting with THC use  OBJECTIVE:   BP 115/80   Pulse 81   Ht 5\' 6"  (1.676 m)   Wt 126 lb 12.8 oz (57.5 kg)   SpO2 99%   BMI 20.47 kg/m   General: Alert, NAD HEENT: NCAT, MMM, thyroid appears nonenlarged Cardiac: RRR Lungs: No increased work of breathing, however frequent dry cough during exam.  Lungs with good air exchange and no wheezing/rhonchi heard. Abdomen: soft, non-tender Msk: Moves all extremities spontaneously  Ext: Warm, dry, 2+ radial pulses, no edema b/l Psych: Depressed mood and affect, frequently tearful during visit  ASSESSMENT/PLAN:   Loss of weight Down 13 pounds since last weight in 12/2019.  Largely suspect this is in the setting of severe depression as discussed below with poor appetite.  However, given her multiple comorbidities, we will go ahead and follow-up CMP, CBC, TSH to ensure no contributing abnormality.  She does have a known family history of colon cancer in her mother and sister with last colonoscopy in 2014, will refer back to GI for screening. Reassuringly no concurrent s/sx suggestive of this currently (will assess for anemia as above). Will discuss updating mammogram and vaginal cuff Pap in 10/2020 (normal in 2019, s/p hysterectomy).   Depression, major, single episode, severe (HCC) PHQ score 24, no SI/HI, in the setting of financial/housing restraints.  Spent  majority of visit providing supportive listening.  Provided therapy resources in the community and crisis hotline, in addition to alternative housing resources to find a new place to live (already working with CSW). Rx'd Lexapro 5 mg to start and may increase to 10 mg if tolerating well.  Follow-up in 2 weeks, pending on how she is feeling on follow-up can consider insomnia agent in addition (discussed melatonin).  Emphysema lung ([place]) Poorly controlled currently as she has been out of her Trelegy inhaler.   Breathing comfortably without wheezing on exam, however does have frequent dry cough.  Refilled Trelegy and albuterol, recommended scheduling albuterol every 4-6 hours for the next 24-48 hours.  If not noting improvement, will send in prednisone course.    Follow-up in 2 weeks for the above concerns or sooner if needed.  [name], [place]

## 2020-09-13 ENCOUNTER — Encounter: Payer: Self-pay | Admitting: Family Medicine

## 2020-09-13 LAB — CBC WITH DIFFERENTIAL/PLATELET
Basophils Absolute: 0 10*3/uL (ref 0.0–0.2)
Basos: 1 %
EOS (ABSOLUTE): 0.1 10*3/uL (ref 0.0–0.4)
Eos: 1 %
Hematocrit: 39.4 % (ref 34.0–46.6)
Hemoglobin: 12.8 g/dL (ref 11.1–15.9)
Immature Grans (Abs): 0 10*3/uL (ref 0.0–0.1)
Immature Granulocytes: 0 %
Lymphocytes Absolute: 3.3 10*3/uL — ABNORMAL HIGH (ref 0.7–3.1)
Lymphs: 53 %
MCH: 29.9 pg (ref 26.6–33.0)
MCHC: 32.5 g/dL (ref 31.5–35.7)
MCV: 92 fL (ref 79–97)
Monocytes Absolute: 0.4 10*3/uL (ref 0.1–0.9)
Monocytes: 6 %
Neutrophils Absolute: 2.3 10*3/uL (ref 1.4–7.0)
Neutrophils: 39 %
Platelets: 271 10*3/uL (ref 150–450)
RBC: 4.28 x10E6/uL (ref 3.77–5.28)
RDW: 12.4 % (ref 11.7–15.4)
WBC: 6.1 10*3/uL (ref 3.4–10.8)

## 2020-09-13 LAB — COMPREHENSIVE METABOLIC PANEL
ALT: 8 IU/L (ref 0–32)
AST: 14 IU/L (ref 0–40)
Albumin/Globulin Ratio: 1.8 (ref 1.2–2.2)
Albumin: 4.7 g/dL (ref 3.8–4.8)
Alkaline Phosphatase: 98 IU/L (ref 44–121)
BUN/Creatinine Ratio: 14 (ref 12–28)
BUN: 13 mg/dL (ref 8–27)
Bilirubin Total: <0.2 mg/dL (ref 0.0–1.2)
CO2: 25 mmol/L (ref 20–29)
Calcium: 9.8 mg/dL (ref 8.7–10.3)
Chloride: 99 mmol/L (ref 96–106)
Creatinine, Ser: 0.93 mg/dL (ref 0.57–1.00)
GFR calc Af Amer: 74 mL/min/{1.73_m2} (ref >59)
GFR calc non Af Amer: 64 mL/min/{1.73_m2} (ref >59)
Globulin, Total: 2.6 g/dL (ref 1.5–4.5)
Glucose: 90 mg/dL (ref 65–99)
Potassium: 4.6 mmol/L (ref 3.5–5.2)
Sodium: 138 mmol/L (ref 134–144)
Total Protein: 7.3 g/dL (ref 6.0–8.5)

## 2020-09-13 LAB — TSH: TSH: 1.33 u[IU]/mL (ref 0.450–4.500)

## 2020-09-14 ENCOUNTER — Encounter: Payer: Self-pay | Admitting: Family Medicine

## 2020-09-14 DIAGNOSIS — F322 Major depressive disorder, single episode, severe without psychotic features: Secondary | ICD-10-CM | POA: Insufficient documentation

## 2020-09-14 NOTE — Assessment & Plan Note (Signed)
Poorly controlled currently as she has been out of her Trelegy inhaler.  Breathing comfortably without wheezing on exam, however does have frequent dry cough.  Refilled Trelegy and albuterol, recommended scheduling albuterol every 4-6 hours for the next 24-48 hours.  If not noting improvement, will send in prednisone course.

## 2020-09-14 NOTE — Assessment & Plan Note (Addendum)
PHQ score 24, no SI/HI, in the setting of financial/housing restraints.  Spent majority of visit providing supportive listening.  Provided therapy resources in the community and crisis hotline, in addition to alternative housing resources to find a new place to live (already working with CSW). Rx'd Lexapro 5 mg to start and may increase to 10 mg if tolerating well.  Follow-up in 2 weeks, pending on how she is feeling on follow-up can consider insomnia agent in addition (discussed melatonin).

## 2020-09-14 NOTE — Assessment & Plan Note (Addendum)
Down 13 pounds since last weight in 12/2019.  Largely suspect this is in the setting of severe depression as discussed below with poor appetite.  However, given her multiple comorbidities, we will go ahead and follow-up CMP, CBC, TSH to ensure no contributing abnormality.  She does have a known family history of colon cancer in her mother and sister with last colonoscopy in 2014, will refer back to GI for screening. Reassuringly no concurrent s/sx suggestive of this currently (will assess for anemia as above). Will discuss updating mammogram and vaginal cuff Pap in 10/2020 (normal in 2019, s/p hysterectomy).

## 2020-10-05 ENCOUNTER — Other Ambulatory Visit: Payer: Self-pay | Admitting: Family Medicine

## 2020-10-05 ENCOUNTER — Telehealth: Payer: Self-pay

## 2020-10-05 DIAGNOSIS — G4700 Insomnia, unspecified: Secondary | ICD-10-CM

## 2020-10-05 MED ORDER — TRAZODONE HCL 50 MG PO TABS: 50.0000 mg | ORAL_TABLET | Freq: Every evening | ORAL | 0 refills | Status: DC | PRN

## 2020-10-05 NOTE — Telephone Encounter (Signed)
Discussed patient's situation with our social worker, Casimer Lanius, who unfortunately did not feel she had any additional resources to add as patient had already been working with a housing Ship broker.  Called back patient to discuss.  States she is going to go live with her daughter in [place] for the next few weeks.  She is very frustrated because she has to get all of her belongings out by Monday.  Already plans to order a [employer] and have some friends help move out.  Discussed that unfortunately I did not have any additional resources, did provide further resources via [facility] during last office visit.  She continues to express lack of appetite, depressed mood, and insomnia.  States she has barely sleeping and just wants a good night rest.  Has not noticed any improvement quite yet with Lexapro, started about 2 weeks ago.  Will send in trazodone to help with insomnia in this interim.  No SI or HI.  Provided supportive listening.  [name], DO

## 2020-10-05 NOTE — Telephone Encounter (Signed)
Patient calls nurse line to follow up with Dr. Higinio Plan regarding previous appointment on 1/12. Patient reports that current social worker has not been able to find housing and that she will be homeless as of Monday 2/7.   Please advise if referral to CCM can be placed.   Talbot Grumbling, RN

## 2021-04-09 ENCOUNTER — Emergency Department (HOSPITAL_COMMUNITY): Payer: 59

## 2021-04-09 ENCOUNTER — Other Ambulatory Visit: Payer: Self-pay

## 2021-04-09 ENCOUNTER — Encounter (HOSPITAL_COMMUNITY): Payer: Self-pay | Admitting: Emergency Medicine

## 2021-04-09 ENCOUNTER — Observation Stay (HOSPITAL_COMMUNITY)
Admission: EM | Admit: 2021-04-09 | Discharge: 2021-04-10 | Disposition: A | Discharge status: Home / Self Care | Payer: 59 | Attending: Family Medicine | Admitting: Family Medicine

## 2021-04-09 DIAGNOSIS — Z7982 Long term (current) use of aspirin: Secondary | ICD-10-CM | POA: Diagnosis not present

## 2021-04-09 DIAGNOSIS — Z20822 Contact with and (suspected) exposure to covid-19: Secondary | ICD-10-CM | POA: Diagnosis not present

## 2021-04-09 DIAGNOSIS — Z8673 Personal history of transient ischemic attack (TIA), and cerebral infarction without residual deficits: Secondary | ICD-10-CM | POA: Insufficient documentation

## 2021-04-09 DIAGNOSIS — I10 Essential (primary) hypertension: Secondary | ICD-10-CM | POA: Insufficient documentation

## 2021-04-09 DIAGNOSIS — F17210 Nicotine dependence, cigarettes, uncomplicated: Secondary | ICD-10-CM | POA: Insufficient documentation

## 2021-04-09 DIAGNOSIS — J45909 Unspecified asthma, uncomplicated: Secondary | ICD-10-CM | POA: Diagnosis not present

## 2021-04-09 DIAGNOSIS — R569 Unspecified convulsions: Principal | ICD-10-CM

## 2021-04-09 DIAGNOSIS — Z79899 Other long term (current) drug therapy: Secondary | ICD-10-CM | POA: Insufficient documentation

## 2021-04-09 DIAGNOSIS — Z7902 Long term (current) use of antithrombotics/antiplatelets: Secondary | ICD-10-CM | POA: Diagnosis not present

## 2021-04-09 DIAGNOSIS — Y900 Blood alcohol level of less than 20 mg/100 ml: Secondary | ICD-10-CM | POA: Insufficient documentation

## 2021-04-09 DIAGNOSIS — J449 Chronic obstructive pulmonary disease, unspecified: Secondary | ICD-10-CM | POA: Insufficient documentation

## 2021-04-09 DIAGNOSIS — R7303 Prediabetes: Secondary | ICD-10-CM | POA: Insufficient documentation

## 2021-04-09 LAB — URINALYSIS, ROUTINE W REFLEX MICROSCOPIC
Bilirubin Urine: NEGATIVE
Glucose, UA: NEGATIVE mg/dL
Ketones, ur: NEGATIVE mg/dL
Leukocytes,Ua: NEGATIVE
Nitrite: NEGATIVE
Protein, ur: NEGATIVE mg/dL
Specific Gravity, Urine: 1.023 (ref 1.005–1.030)
pH: 5 (ref 5.0–8.0)

## 2021-04-09 LAB — CBC WITH DIFFERENTIAL/PLATELET
Abs Immature Granulocytes: 0.02 10*3/uL (ref 0.00–0.07)
Basophils Absolute: 0 10*3/uL (ref 0.0–0.1)
Basophils Relative: 0 %
Eosinophils Absolute: 0 10*3/uL (ref 0.0–0.5)
Eosinophils Relative: 0 %
HCT: 38.9 % (ref 36.0–46.0)
Hemoglobin: 12.5 g/dL (ref 12.0–15.0)
Immature Granulocytes: 0 %
Lymphocytes Relative: 11 %
Lymphs Abs: 1.1 10*3/uL (ref 0.7–4.0)
MCH: 30.7 pg (ref 26.0–34.0)
MCHC: 32.1 g/dL (ref 30.0–36.0)
MCV: 95.6 fL (ref 80.0–100.0)
Monocytes Absolute: 0.3 10*3/uL (ref 0.1–1.0)
Monocytes Relative: 4 %
Neutro Abs: 8.1 10*3/uL — ABNORMAL HIGH (ref 1.7–7.7)
Neutrophils Relative %: 85 %
Platelets: 226 10*3/uL (ref 150–400)
RBC: 4.07 MIL/uL (ref 3.87–5.11)
RDW: 14.1 % (ref 11.5–15.5)
WBC: 9.6 10*3/uL (ref 4.0–10.5)
nRBC: 0 % (ref 0.0–0.2)

## 2021-04-09 LAB — RAPID URINE DRUG SCREEN, HOSP PERFORMED
Amphetamines: NOT DETECTED
Barbiturates: NOT DETECTED
Benzodiazepines: NOT DETECTED
Cocaine: NOT DETECTED
Opiates: NOT DETECTED
Tetrahydrocannabinol: POSITIVE — AB

## 2021-04-09 LAB — HEPATIC FUNCTION PANEL
ALT: 13 U/L (ref 0–44)
AST: 29 U/L (ref 15–41)
Albumin: 4.1 g/dL (ref 3.5–5.0)
Alkaline Phosphatase: 83 U/L (ref 38–126)
Bilirubin, Direct: 0.6 mg/dL — ABNORMAL HIGH (ref 0.0–0.2)
Indirect Bilirubin: 0.6 mg/dL (ref 0.3–0.9)
Total Bilirubin: 1.2 mg/dL (ref 0.3–1.2)
Total Protein: 7.1 g/dL (ref 6.5–8.1)

## 2021-04-09 LAB — SARS CORONAVIRUS 2 (TAT 6-24 HRS): SARS Coronavirus 2: NEGATIVE

## 2021-04-09 LAB — BASIC METABOLIC PANEL
Anion gap: 9 (ref 5–15)
BUN: 14 mg/dL (ref 8–23)
CO2: 22 mmol/L (ref 22–32)
Calcium: 9.4 mg/dL (ref 8.9–10.3)
Chloride: 106 mmol/L (ref 98–111)
Creatinine, Ser: 0.99 mg/dL (ref 0.44–1.00)
GFR, Estimated: >60 mL/min (ref >60)
Glucose, Bld: 140 mg/dL — ABNORMAL HIGH (ref 70–99)
Potassium: 5.3 mmol/L — ABNORMAL HIGH (ref 3.5–5.1)
Sodium: 137 mmol/L (ref 135–145)

## 2021-04-09 LAB — CBG MONITORING, ED: Glucose-Capillary: 79 mg/dL (ref 70–99)

## 2021-04-09 LAB — TROPONIN I (HIGH SENSITIVITY)
Troponin I (High Sensitivity): 22 ng/L — ABNORMAL HIGH (ref <18)
Troponin I (High Sensitivity): 22 ng/L — ABNORMAL HIGH (ref <18)

## 2021-04-09 LAB — ETHANOL: Alcohol, Ethyl (B): <10 mg/dL (ref <10)

## 2021-04-09 LAB — LIPASE, BLOOD: Lipase: 42 U/L (ref 11–51)

## 2021-04-09 LAB — MAGNESIUM: Magnesium: 2.2 mg/dL (ref 1.7–2.4)

## 2021-04-09 MED ORDER — LORAZEPAM 2 MG/ML IJ SOLN
INTRAMUSCULAR | Status: AC
  Filled 2021-04-09: qty 1

## 2021-04-09 MED ORDER — AMLODIPINE BESYLATE 5 MG PO TABS: 5.0000 mg | ORAL_TABLET | Freq: Every day | ORAL | Status: DC

## 2021-04-09 MED ORDER — ATORVASTATIN CALCIUM 80 MG PO TABS
80.0000 mg | ORAL_TABLET | Freq: Every day | ORAL | Status: DC
  Administered 2021-04-10: 80 mg via ORAL
  Filled 2021-04-09: qty 1
  Filled 2021-04-09: qty 2

## 2021-04-09 MED ORDER — ADULT MULTIVITAMIN W/MINERALS CH
1.0000 | ORAL_TABLET | Freq: Every day | ORAL | Status: DC
  Administered 2021-04-10: 1 via ORAL
  Filled 2021-04-09: qty 1

## 2021-04-09 MED ORDER — ASPIRIN 300 MG RE SUPP
300.0000 mg | Freq: Once | RECTAL | Status: AC
  Administered 2021-04-09: 300 mg via RECTAL
  Filled 2021-04-09: qty 1

## 2021-04-09 MED ORDER — ALBUTEROL SULFATE HFA 108 (90 BASE) MCG/ACT IN AERS: 2.0000 | INHALATION_SPRAY | RESPIRATORY_TRACT | Status: DC | PRN

## 2021-04-09 MED ORDER — SODIUM CHLORIDE 0.9 % IV SOLN
75.0000 mL/h | INTRAVENOUS | Status: DC
  Administered 2021-04-09 – 2021-04-10 (×2): 75 mL/h via INTRAVENOUS

## 2021-04-09 MED ORDER — LEVETIRACETAM IN NACL 500 MG/100ML IV SOLN
500.0000 mg | Freq: Two times a day (BID) | INTRAVENOUS | Status: DC
  Administered 2021-04-10 (×2): 500 mg via INTRAVENOUS
  Filled 2021-04-09 (×3): qty 100

## 2021-04-09 MED ORDER — ASPIRIN EC 81 MG PO TBEC
81.0000 mg | DELAYED_RELEASE_TABLET | Freq: Every day | ORAL | Status: DC
  Filled 2021-04-09: qty 1

## 2021-04-09 MED ORDER — SODIUM CHLORIDE 0.9 % IV BOLUS
500.0000 mL | Freq: Once | INTRAVENOUS | Status: AC
  Administered 2021-04-09: 500 mL via INTRAVENOUS

## 2021-04-09 MED ORDER — CHLORHEXIDINE GLUCONATE 0.12% ORAL RINSE (MEDLINE KIT): 15.0000 mL | Freq: Two times a day (BID) | OROMUCOSAL | Status: DC

## 2021-04-09 MED ORDER — LEVETIRACETAM IN NACL 1500 MG/100ML IV SOLN
1500.0000 mg | Freq: Once | INTRAVENOUS | Status: AC
  Administered 2021-04-09: 1500 mg via INTRAVENOUS
  Filled 2021-04-09: qty 100

## 2021-04-09 MED ORDER — ENOXAPARIN SODIUM 40 MG/0.4ML IJ SOSY
40.0000 mg | PREFILLED_SYRINGE | INTRAMUSCULAR | Status: DC
  Administered 2021-04-09: 40 mg via SUBCUTANEOUS
  Filled 2021-04-09: qty 0.4
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
## 2021-04-09 NOTE — ED Notes (Signed)
Patient transported to MRI 

## 2021-04-09 NOTE — Progress Notes (Signed)
Pt combative, Education administrator getting onto scan table. Will not lay flat for exam, turning over onto side. Pt NOT A/O. Cannot scan pt in this state.

## 2021-04-09 NOTE — ED Provider Notes (Signed)
  Physical Exam  BP (!) 163/86   Pulse 83   Temp 98.3 F (36.8 C) (Oral)   Resp 17   Ht 5' 6.5" (1.689 m)   Wt 59 kg   SpO2 95%   BMI 20.67 kg/m   Physical Exam Vitals and nursing note reviewed.  Constitutional:      General: She is not in acute distress.    Appearance: She is well-developed.     Comments: pt is very sleepy  HENT:     Head: Normocephalic and atraumatic.  Cardiovascular:     Rate and Rhythm: Normal rate.  Pulmonary:     Effort: Pulmonary effort is normal.  Abdominal:     General: There is no distension.  Musculoskeletal:        General: Normal range of motion.     Cervical back: Normal range of motion.  Skin:    General: Skin is warm.     Findings: No rash.  Neurological:     Mental Status: She is oriented to person, place, and time.     Comments: Pt not responding verbally, but will open eyes to speech and track movement. Daughter states she has been very sleepy since 2nd sz.     ED Course/Procedures     Procedures  MDM   Pt signed out to me by B Athena Masse, PA-C. Please see previous notes for further history.   In brief, pt presenting for evaluation of new onset seizures. Had witnessed sz at home today, arrived to the ER alert and oriented. W/u overall reassuring. Pt had another seaiure in the ER, witnessed by family. Lasted less than 1 minute. Reported to be generalized/tonic-clonic mvt. Pt now given keppra, pending head CT, and plan for admission.   Discussed with Dr. Rory Percy from neurology who reviewed the head CT. Recommends admission to medicine, neuro to consult.   Discussed with family practice teaching service, pt to be admitted.   CT shows possible subacute vs chronic stroke. In the setting of new seizures, will order MRI for further eval.       Franchot Heidelberg, PA-C 04/09/21 1658    Dorie Rank, MD 04/10/21 2022

## 2021-04-09 NOTE — ED Notes (Signed)
This RN rounded on pt, pt resting at this time. Family member at bedside reported that pt had previously woken up and was able to tell what happened today and what year it was.
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
## 2021-04-09 NOTE — ED Provider Notes (Signed)
[facility] EMERGENCY DEPARTMENT Provider Note   CSN: [id number] Arrival date & time: 04/09/21  1308     History Chief Complaint  Patient presents with   Seizures    [name] is a 70 y.o. female history includes CVA, asthma, chronic bronchitis, chronic back pain, COPD, GERD, psoriasis, hypertension.  Patient arrives via EMS today following a seizure at home.  No prior history of seizures.  Patient does not recall events leading to her ER arrival.  She denies any pain reports that she has had some mild stomach pain over the past few weeks but does not describe it as severe.  She denies headache, vision changes, neck pain, back pain, chest pain, abdominal pain currently, extremity pain or any additional concerns.  Remainder of history obtained from triage note, patient without seizure history, witnessed by family today, patient was vomiting clear phlegm.  Seizure lasted approximately 1 minute.  Level 5 caveat postictal  Attempted to call patient's family member however no answer.   HPI     Past Medical History:  Diagnosis Date   Abdominal pain, epigastric 02/23/2013   Abdominal pain, unspecified site 08/15/2013   Abnormal neurological exam 04/11/2016   Acute CVA (cerebrovascular accident) ([place]) 11/03/2019   Acute non-recurrent frontal sinusitis 05/21/2018   Asthma    Cataracts, bilateral 05/03/2014   Cephalalgia    Chronic bronchitis (HCC)    Chronic lower back pain    COPD (chronic obstructive pulmonary disease) (HCC)    Daily headache    De Quervain's tenosynovitis, left 11/09/2014   GERD (gastroesophageal reflux disease)    Hx of colonic polyps    Maxillary sinusitis    Nausea & vomiting    Orthopnea 09/28/2015   Reports of 5 pillow orthopnea (from 2-3) which began with cold symptoms. Could be due to cold but would like to rule out cardiac etiology. Also noted of 1 year history of dyspnea on exertion which only worsens when she gets a cold; this could be  due to COPD however cardiac etiology should be considered. No JVD or LE edema on exam.  - ECHO ordered - will refer to cardiology if ECHO concerning for H   PSORIASIS 12/29/2006   Qualifier: Diagnosis of  By: Jobe Igo MD, David     Right leg pain 04/02/2016   Smoker     Patient Active Problem List   Diagnosis Date Noted   Depression, major, single episode, severe ([place]) 09/14/2020   Healthcare maintenance 11/27/2019   Vaginal Pap smear with LGSIL 11/23/2019   CVA (cerebral vascular accident) ([place]) 11/04/2019   Prediabetes 10/27/2017   PAD (peripheral artery disease) ([place]). 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   Occlusion of common femoral artery (HCC)    Loss of weight    Lumbar disc herniation    Hypertensive retinopathy of right eye, grade 1 05/03/2014   Hypertensive retinopathy of left eye, grade 2 05/03/2014   Dyspnea 08/10/2013   Protein-calorie malnutrition, severe ([place]) 123456   Cyclical vomiting 99991111   Tobacco abuse    GASTROESOPHAGEAL REFLUX DISEASE 12/29/2006   PSORIASIS 12/29/2006    Past Surgical History:  Procedure Laterality Date   ABDOMINAL AORTOGRAM W/LOWER EXTREMITY N/A 10/06/2016   Procedure: Abdominal Aortogram w/Lower Extremity;  Surgeon: Waynetta Sandy, MD;  Location: [facility];  Service: Cardiovascular;  Laterality: N/A;   ABDOMINAL AORTOGRAM  W/LOWER EXTREMITY N/A 02/10/2018   Procedure: ABDOMINAL AORTOGRAM W/LOWER EXTREMITY;  Surgeon: Waynetta Sandy, MD;  Location: [facility];  Service: Cardiovascular;  Laterality: N/A;   ABDOMINAL HERNIA REPAIR     APPENDECTOMY     COLONOSCOPY W/ POLYPECTOMY  02/26/2005   FEMORAL-POPLITEAL BYPASS GRAFT Left 04/20/2018   Procedure: Left FEMORAL-POPLITEAL ARTERY Bypass Graft using Gore Propaten Vascular Graft;  Surgeon: Waynetta Sandy, MD;   Location: [facility];  Service: Vascular;  Laterality: Left;   HERNIA REPAIR     LOWER EXTREMITY ANGIOGRAPHY Bilateral 04/19/2018   Procedure: LOWER EXTREMITY ANGIOGRAPHY;  Surgeon: Waynetta Sandy, MD;  Location: [facility];  Service: Cardiovascular;  Laterality: Bilateral;   PERIPHERAL VASCULAR ATHERECTOMY Right 10/06/2016   Procedure: Peripheral Vascular Atherectomy;  Surgeon: Waynetta Sandy, MD;  Location: [facility];  Service: Cardiovascular;  Laterality: Right;   PERIPHERAL VASCULAR INTERVENTION Right 10/06/2016   Procedure: Peripheral Vascular Intervention;  Surgeon: Waynetta Sandy, MD;  Location: [facility];  Service: Cardiovascular;  Laterality: Right;  SFA   PERIPHERAL VASCULAR INTERVENTION Left 02/10/2018   Procedure: PERIPHERAL VASCULAR INTERVENTION;  Surgeon: Waynetta Sandy, MD;  Location: [facility];  Service: Cardiovascular;  Laterality: Left;   TONSILLECTOMY  1960s   UPPER GASTROINTESTINAL ENDOSCOPY  03/22/2010, 02/13/2005   VAGINAL HYSTERECTOMY     partial     OB History   No obstetric history on file.     Family History  Problem Relation Age of Onset   Cancer Other    Coronary artery disease Other    Cancer Mother    Hypertension Mother    Heart disease Mother    Cancer Father    Hypertension Father    Cancer Sister    Hypertension Sister     Social History   Tobacco Use   Smoking status: Every Day    Packs/day: 0.50    Years: 47.00    Pack years: 23.50    Types: Cigarettes   Smokeless tobacco: Never   Tobacco comments:    3-4 cigarettes per day  Vaping Use   Vaping Use: Never used  Substance Use Topics   Alcohol use: Not Currently    Alcohol/week: 0.0 standard drinks    Comment: Quit alcohol in 1992; "never really drank much alcohol"   Drug use: Yes    Types: Marijuana    Comment: 02/10/2018 "~ twice//wk"    Home Medications Prior to Admission medications   Medication Sig Start Date  End Date Taking? Authorizing Provider  albuterol (VENTOLIN HFA) 108 (90 Base) MCG/ACT inhaler INHALE 2 PUFFS INTO THE LUNGS EVERY 4 HOURS AS NEEDED FOR WHEEZING OR SHORTNESS OF BREATH 09/12/20   [name], DO  amLODipine (NORVASC) 5 MG tablet Take 1 tablet (5 mg total) by mouth daily. 11/03/19   Jaynee Eagles, PA-C  aspirin EC 81 MG tablet Take 1 tablet (81 mg total) by mouth daily. 11/08/19   Kathrene Alu, MD  atorvastatin (LIPITOR) 80 MG tablet Take 1 tablet (80 mg total) by mouth daily at 6 PM. 11/08/19   Winfrey, Alcario Drought, MD  clopidogrel (PLAVIX) 75 MG tablet Take 1 tablet (75 mg total) by mouth daily. '75mg'$  aspirin only after finishing this. Patient not taking: Reported on 08/16/2020 01/31/20   [name], DO  divalproex (DEPAKOTE ER) 500 MG 24 hr tablet Take 1 tablet (500 mg total) by mouth daily. 12/06/19   Garvin Fila,  MD  escitalopram (LEXAPRO) 5 MG tablet Take 1 tablet (5 mg total) by mouth daily. May increase to '10mg'$  after 1 week if tolerating. 09/12/20   [name], DO  promethazine (PHENERGAN) 12.5 MG tablet Take 1 tablet (12.5 mg total) by mouth every 8 (eight) hours as needed for nausea or vomiting. 03/16/20   [name], DO  traZODone (DESYREL) 50 MG tablet Take 1 tablet (50 mg total) by mouth at bedtime as needed for sleep. 10/05/20   [name], DO    Allergies    Patient has no known allergies.  Review of Systems   Review of Systems  Unable to perform ROS: Mental status change postictal  Physical Exam Updated Vital Signs BP (!) 166/92   Pulse 97   Temp 98.3 F (36.8 C) (Oral)   Resp 17   Ht 5' 6.5" (1.689 m)   Wt 59 kg   SpO2 95%   BMI 20.67 kg/m   Physical Exam Constitutional:      General: She is not in acute distress.    Appearance: Normal appearance. She is well-developed. She is not ill-appearing or diaphoretic.  HENT:     Head: Normocephalic and atraumatic.  Eyes:     General: Vision grossly intact. Gaze aligned  appropriately.     Pupils: Pupils are equal, round, and reactive to light.  Neck:     Trachea: Trachea and phonation normal.  Pulmonary:     Effort: Pulmonary effort is normal. No respiratory distress.  Abdominal:     General: There is no distension.     Palpations: Abdomen is soft.     Tenderness: There is no abdominal tenderness. There is no guarding or rebound.  Musculoskeletal:        General: Normal range of motion.     Cervical back: Normal range of motion.     Comments: No midline C/T/L spinal tenderness to palpation, no paraspinal muscle tenderness, no deformity, crepitus, or step-off noted. No sign of injury to the neck or back.  No TTP of the bilateral hips.  Patient did report to a seated position without assistance or difficulty.  All major joints mobilized without pain.  Skin:    General: Skin is warm and dry.  Neurological:     Mental Status: She is alert.     GCS: GCS eye subscore is 4. GCS verbal subscore is 5. GCS motor subscore is 6.     Comments: Speech is clear and goal oriented, follows commands Major Cranial nerves without deficit, no facial droop Moves extremities without ataxia, coordination intact  Psychiatric:        Behavior: Behavior normal.    ED Results / Procedures / Treatments   Labs (all labs ordered are listed, but only abnormal results are displayed) Labs Reviewed - No data to display  EKG None  Radiology No results found.  Procedures Procedures   Medications Ordered in ED Medications - No data to display  ED Course  I have reviewed the triage vital signs and the nursing notes.  Pertinent labs & imaging results that were available during my care of the patient were reviewed by me and considered in my medical decision making (see chart for details).  Clinical Course as of 04/09/21 1509  Tue Apr 09, 2021  1333 Zonnique, Carsey (Daughter)  [phone] [facility])  No answer [BM]    Clinical Course User Index [BM] Gari Crown   MDM Rules/Calculators/A&P  Additional history obtained from: Nursing notes from this visit. Family, daughter at bedside. ---- 70 year old female presented after first-time seizure today.  On initial evaluation patient was slightly confused to events prior to ED arrival but overall well-appearing and in no acute distress.  Patient had no complaints reports that she felt well.  Vital signs were stable.  Seizure lab work was ordered in addition to CT head.  Patient did complain of some intermittent abdominal pain for the past few days, have added on LFTs and lipase for further evaluation.  Additionally given patient's nausea atypical ACS was considered and an EKG chest x-ray and troponin levels were ordered. - I ordered, reviewed and interpreted labs which include: CBC without leukocytosis, anemia or thrombocytopenia. BMP shows mildly elevated potassium at 5.3, no other electrolyte derangement, AKI or gap. Magnesium within normal limits. Lipase within normal limits. Ethanol negative. LFTs unremarkable. High-sensitivity troponin minimally elevated at 22, will obtain delta troponin.  CXR:    IMPRESSION:  1. No active disease.  --- At 1245 I was called to the room as patient had a recurrent seizure.  On my arrival no seizure-like activity observed patient postictal.  Initially 2 mg was ordered verbally in route to room however this was canceled since patient seizures had resolved.  Patient was also seen by Dr. Roslynn Amble at this time, plan of care is 1.5 g Keppra.  Patient is currently awaiting CT head.  Patient's daughter denies that patient has a history of alcohol use.  On reassessment no recurrence of seizure-like activities. - Care handoff given to [place] PA-C at shift change, plan of care is to follow-up on CT head, continue monitoring and consult neurology.  Anticipate admission.     Note: Portions of this report may have been transcribed using  voice recognition software. Every effort was made to ensure accuracy; however, inadvertent computerized transcription errors may still be present.  Final Clinical Impression(s) / ED Diagnoses Final diagnoses:  None    Rx / DC Orders ED Discharge Orders     None        Gari Crown 04/09/21 1510    Lucrezia Starch, MD 04/10/21 [phone]

## 2021-04-09 NOTE — ED Triage Notes (Signed)
Pt BIB [facility] c/o seizures, no hx. Coming from home, family reports vomiting clear phlegm. Pt had a single seizure lasting approximately 1 minute, full body. With EMS pt has been able to follow very simple commands.   150/70, HR 110, SpO2 96% on 2L, 18 RR, CBG 150

## 2021-04-09 NOTE — ED Notes (Signed)
Espinoza, DO made aware that pt is too lethargic to swallow water or take PO meds.

## 2021-04-09 NOTE — ED Notes (Signed)
Pt placed on 2L via [place]. Room air sats 92%, SpO2 currently 96% on 2L.

## 2021-04-09 NOTE — ED Notes (Signed)
Pt had witnessed seizure lasting 1 minute. Erlene Quan, [place] and Roslynn Amble, MD at bedside. Pt placed on 15L NRB. Pt postictal at this time.

## 2021-04-10 ENCOUNTER — Observation Stay (HOSPITAL_COMMUNITY): Payer: 59

## 2021-04-10 ENCOUNTER — Encounter (HOSPITAL_COMMUNITY): Payer: Self-pay | Admitting: Family Medicine

## 2021-04-10 ENCOUNTER — Other Ambulatory Visit (HOSPITAL_COMMUNITY): Payer: Self-pay

## 2021-04-10 DIAGNOSIS — R569 Unspecified convulsions: Secondary | ICD-10-CM

## 2021-04-10 LAB — LIPID PANEL
Cholesterol: 214 mg/dL — ABNORMAL HIGH (ref 0–200)
HDL: 45 mg/dL (ref >40)
LDL Cholesterol: 153 mg/dL — ABNORMAL HIGH (ref 0–99)
Total CHOL/HDL Ratio: 4.8 RATIO
Triglycerides: 80 mg/dL (ref <150)
VLDL: 16 mg/dL (ref 0–40)

## 2021-04-10 LAB — BASIC METABOLIC PANEL
Anion gap: 8 (ref 5–15)
BUN: 12 mg/dL (ref 8–23)
CO2: 23 mmol/L (ref 22–32)
Calcium: 9.1 mg/dL (ref 8.9–10.3)
Chloride: 106 mmol/L (ref 98–111)
Creatinine, Ser: 1.03 mg/dL — ABNORMAL HIGH (ref 0.44–1.00)
GFR, Estimated: 59 mL/min — ABNORMAL LOW (ref >60)
Glucose, Bld: 100 mg/dL — ABNORMAL HIGH (ref 70–99)
Potassium: 3.7 mmol/L (ref 3.5–5.1)
Sodium: 137 mmol/L (ref 135–145)

## 2021-04-10 LAB — HEMOGLOBIN A1C
Hgb A1c MFr Bld: 6.1 % — ABNORMAL HIGH (ref 4.8–5.6)
Mean Plasma Glucose: 128.37 mg/dL

## 2021-04-10 LAB — VITAMIN B12: Vitamin B-12: 439 pg/mL (ref 180–914)

## 2021-04-10 LAB — HIV ANTIBODY (ROUTINE TESTING W REFLEX): HIV Screen 4th Generation wRfx: NONREACTIVE

## 2021-04-10 MED ORDER — AMLODIPINE BESYLATE 5 MG PO TABS: 5.0000 mg | ORAL_TABLET | Freq: Every day | ORAL | Status: DC

## 2021-04-10 MED ORDER — ATORVASTATIN CALCIUM 80 MG PO TABS: 80.0000 mg | ORAL_TABLET | Freq: Every day | ORAL | 3 refills | Status: DC

## 2021-04-10 MED ORDER — LEVETIRACETAM 500 MG PO TABS
500.0000 mg | ORAL_TABLET | Freq: Two times a day (BID) | ORAL | 0 refills | Status: DC
  Filled 2021-04-10: qty 60, 30d supply, fill #0

## 2021-04-10 MED ORDER — GADOBUTROL 1 MMOL/ML IV SOLN
5.9000 mL | Freq: Once | INTRAVENOUS | Status: AC | PRN
  Administered 2021-04-10: 5.9 mL via INTRAVENOUS

## 2021-04-10 MED ORDER — ACETAMINOPHEN 325 MG PO TABS
650.0000 mg | ORAL_TABLET | Freq: Four times a day (QID) | ORAL | Status: DC | PRN
  Administered 2021-04-10: 650 mg via ORAL
  Filled 2021-04-10: qty 2

## 2021-04-10 MED ORDER — AMLODIPINE BESYLATE 5 MG PO TABS: 5.0000 mg | ORAL_TABLET | Freq: Every day | ORAL | 1 refills | Status: DC

## 2021-04-10 MED ORDER — ALBUTEROL SULFATE (2.5 MG/3ML) 0.083% IN NEBU: 2.5000 mg | INHALATION_SOLUTION | RESPIRATORY_TRACT | Status: DC | PRN

## 2021-04-10 MED ORDER — LEVETIRACETAM ER 500 MG PO TB24
500.0000 mg | ORAL_TABLET | Freq: Two times a day (BID) | ORAL | 1 refills | Status: DC
  Filled 2021-04-10: qty 60, 30d supply, fill #0

## 2021-04-10 MED ORDER — ASPIRIN EC 81 MG PO TBEC
81.0000 mg | DELAYED_RELEASE_TABLET | Freq: Every day | ORAL | Status: DC
  Administered 2021-04-10: 81 mg via ORAL
  Filled 2021-04-10: qty 1

## 2021-04-10 MED ORDER — LEVETIRACETAM ER 500 MG PO TB24: 500.0000 mg | ORAL_TABLET | Freq: Every day | ORAL | Status: DC

## 2021-04-10 NOTE — ED Notes (Signed)
Attempted to give report to floor [phone]

## 2021-04-10 NOTE — Discharge Summary (Addendum)
[facility] Discharge Summary  Patient name: [name] Medical record number: [id number] Date of birth: [birth year] Age: 70 y.o. Gender: female Date of Admission: 04/09/2021  Date of Discharge: 04/10/21 Admitting Physician: Lenoria Chime, MD  Primary Care Provider: Shary Key, DO Consultants: Neurology  Indication for Hospitalization: Seizure  Discharge Diagnoses/Problem List:  Active Problems:   Seizure ([place]) HLD Prior CVA  Disposition: Home  Discharge Condition: Stable  Discharge Exam:  Blood pressure 128/74, pulse 79, temperature 98.2 F (36.8 C), temperature source Oral, resp. rate 16, height 5' 6.5" (1.689 m), weight 59 kg, SpO2 100 %.  General: Awake, alert and appropriately responsive in NAD Chest: CTAB, normal WOB. Good air movement bilaterally.   Heart: RRR, no murmur appreciated Abdomen: Soft, non-tender, non-distended. Normoactive bowel sounds. Extremities:   Brief Hospital Course:  [name] is a 70 y.o. female who presented with new onset seizure. PMH is significant for CVA, asthma, chronic bronchitis, chronic back pain, COPD, GERD, psoriasis, HTN, tobacco abuse. Below is her hospital course listed by problem.  For more information refer to her H&P.  New-onset Seizure Presented via EMS for new onset seizure that occurred at home.  In ED, vital stable.  Patient was postictal.  Labs were unremarkable.  UDS was positive for THC.  CT head with late subacute to chronic infarct within the superior right occipital lobe.  MRI brain with encephalomalacia.  Patient had another brief 45 seconds to 1 minute episode of seizure-like activity in the ED.  She did not require Ativan.  Neurology consulted and loaded her with Keppra.  Patient continued on Keppra 500 mg twice daily. EEG within normal limits. No further seizure activity while admitted. Will follow up with Neurology outpatient.  No driving because of seizure precautions was  emphasized before discharge.  HLD  Hx Prior CVA Initial blood pressure medications were held in the setting of postictal state and concern for possible new seizure (prior to MRI being obtained). Received 300 mg rectal ASA x1. She was restarted on her home BP medication prior to d/c. Lipid panel with LDL 153. She was restarted on her home Atorvastatin 80 mg and ASA 81 mg.   Malnutrition Reported weight loss and decreased appetite.  Per chart review her weight appears stable.   Other problems chronic and stable.   PCP Follow Up Ensure she is complaint with medications- especially Keppra, ASA, BP and cholesterol medications. Hx of COPD but only reported being on Albuterol. Appears she was previously on Trelegy Ellipta.  Ensure follow up with Neurology. Counsel on [place] cessation. Reinforce no driving given new-onset seizures. Check up on weight, had reported weight loss and decrease appetite. Would probably benefit from marijuana cessation.    Significant Procedures: EEG  Significant Labs and Imaging:  Recent Labs  Lab 04/09/21 1341  WBC 9.6  HGB 12.5  HCT 38.9  PLT 226   Recent Labs  Lab 04/09/21 1341 04/10/21 0645  NA 137 137  K 5.3* 3.7  CL 106 106  CO2 22 23  GLUCOSE 140* 100*  BUN 14 12  CREATININE 0.99 1.03*  CALCIUM 9.4 9.1  MG 2.2  --   ALKPHOS 83  --   AST 29  --   ALT 13  --   ALBUMIN 4.1  --     CT HEAD WO CONTRAST  Result Date: 04/09/2021 CLINICAL DATA:  Seizure, nontraumatic (Age >= 29y). EXAM: CT HEAD WITHOUT CONTRAST TECHNIQUE: Contiguous axial images were obtained from  the base of the skull through the vertex without intravenous contrast. COMPARISON:  Brain MRI 11/04/2019.  Noncontrast head CT 11/03/2019. FINDINGS: Brain: Mild generalized cerebral atrophy. Small cortical/subcortical infarct within the superior right occipital lobe, new as compared to the brain MRI of 11/03/2020 but late subacute to chronic in appearance. Mild patchy and ill-defined  hypoattenuation within the cerebral white matter, nonspecific but compatible with chronic small vessel ischemic disease. Redemonstrated chronic lacunar infarct within the left basal ganglia. Redemonstrated chronic infarcts within the bilateral cerebellar hemispheres. There is no acute intracranial hemorrhage. No extra-axial fluid collection. No evidence of an intracranial mass. No midline shift. Vascular: No hyperdense vessel.  Atherosclerotic calcifications. Skull: Normal. Negative for fracture or focal suspicious osseous lesion. Sinuses/Orbits: Visualized orbits show no acute finding. No significant paranasal sinus disease at the imaged levels. IMPRESSION: Small cortical/subcortical infarct within the superior right occipital lobe, new from the brain MRI of 11/04/2019, but late subacute to chronic in appearance. A brain MRI may be obtained for further evaluation, as clinically warranted. Background mild chronic small vessel ischemic changes within the cerebral white matter, stable. Redemonstrated chronic lacunar infarct within the left basal ganglia. Redemonstrated chronic infarcts within the bilateral cerebellar hemispheres. Mild generalized cerebral atrophy. Electronically Signed   By: Kellie Simmering DO   On: 04/09/2021 16:27   MR BRAIN W WO CONTRAST  Result Date: 04/10/2021 CLINICAL DATA:  70 year old female with seizure. History of chronic ischemic disease, and newly abnormal right occipital lobe on CT. 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 EXAM: MRI HEAD WITHOUT AND WITH CONTRAST TECHNIQUE: Multiplanar, multiecho pulse sequences of the brain and surrounding structures were obtained without and with intravenous contrast. CONTRAST:  5.22m GADAVIST GADOBUTROL 1 MMOL/ML IV SOLN COMPARISON:  Head CT 04/09/2021.  Brain MRI 11/04/2019 and earlier. FINDINGS: Brain: Facilitated diffusion in the right superior occipital lobe near the parieto-occipital sulcus with appearance of developing cortical  encephalomalacia there on series 7, image 16. Associated hemosiderin on SWI (series 8, image 56). No restricted diffusion to suggest acute infarction. No midline shift, mass effect, evidence of mass lesion, ventriculomegaly, extra-axial collection or acute intracranial hemorrhage. Cervicomedullary junction and pituitary are within normal limits. 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 Chronic infarcts in the bilateral cerebellum. No discrete encephalomalacia at the right lateral medulla. Mild T2 heterogeneity in the basal ganglia is stable and greater on the left. Widely scattered bilateral cerebral white matter T2 and FLAIR hyperintensity is stable since last year. No other cortical encephalomalacia or chronic cerebral blood products identified. No abnormal enhancement identified. No dural thickening. Thin slice coronal temporal lobe imaging. Hippocampal formations and other mesial temporal lobe structures appear symmetric and within normal limits. Vascular: Distal right vertebral artery flow void is chronically asymmetric, but appears further decreased since last year. Other Major intracranial vascular flow voids are preserved. Skull and upper cervical spine: Negative. Normal bone marrow signal. Sinuses/Orbits: Stable, negative. Other: Trace mastoid air cell fluid is stable. Negative visible nasopharynx. An oval 12 mm T2 hyperintense lesion of the deep lobe of the right parotid gland is stable since 2013 indicating benign etiology (series 10, image 26). IMPRESSION: 1.  No acute intracranial abnormality. 2. Small area of developing encephalomalacia with hemosiderin in the right superior occipital lobe corresponds to the recent CT finding. 3. Underlying chronic cerebellar infarcts with evidence of chronically poor flow or occlusion in the distal right vertebral artery. 4. Chronic cerebral white matter disease is stable since  last year. Electronically Signed    By: Genevie Ann M.D.   On: 04/10/2021 05:06   DG Chest Portable 1 View  Result Date: 04/09/2021 CLINICAL DATA:  Seizure. EXAM: PORTABLE CHEST 1 VIEW COMPARISON:  Chest x-ray dated November 03, 2019. FINDINGS: The heart size and mediastinal contours are within normal limits. Normal pulmonary vascularity. The lungs remain hyperinflated. No focal consolidation, pleural effusion, or pneumothorax. No acute osseous abnormality. IMPRESSION: 1. No active disease. Electronically Signed   By: Titus Dubin M.D.   On: 04/09/2021 14:51   EEG adult now  Result Date: 04/10/2021 Lora Havens, MD     04/10/2021 11:51 AM Patient Name: [name] MRN: [id number] Epilepsy Attending: Lora Havens Referring Physician/Provider: Dr Sharion Settler Date: 04/10/2021 Duration: 23.24 mins Patient history: 70 year old woman with new onset seizures. EEG to evaluate for seizure Level of alertness: Awake, asleep AEDs during EEG study: LEV Technical aspects: This EEG study was done with scalp electrodes positioned according to the 10-20 International system of electrode placement. Electrical activity was acquired at a sampling rate of '500Hz'$  and reviewed with a high frequency filter of '70Hz'$  and a low frequency filter of '1Hz'$ . EEG data were recorded continuously and digitally stored. Description: The posterior dominant rhythm consists of 8.5 Hz activity of moderate voltage (25-35 uV) seen predominantly in posterior head regions, symmetric and reactive to eye opening and eye closing. Sleep was characterized by vertex waves, sleep spindles (12 to 14 Hz), maximal frontocentral region. Sharp transients were noted with generalized onset, predominantly in sleep. Hyperventilation and photic stimulation were not performed.   IMPRESSION: This study is within normal limits. No seizures or epileptiform discharges were seen throughout the recording. If suspicion for ictal- interictal activity remains a concern, a prolonged study can be considered.  Priyanka Barbra Sarks     Results/Tests Pending at Time of Discharge:  None  Discharge Medications:  Allergies as of 04/10/2021   No Known Allergies      Medication List     STOP taking these medications    clopidogrel 75 MG tablet Commonly known as: Plavix   divalproex 500 MG 24 hr tablet Commonly known as: Depakote ER   promethazine 12.5 MG tablet Commonly known as: PHENERGAN   traZODone 50 MG tablet Commonly known as: DESYREL       TAKE these medications    albuterol 108 (90 Base) MCG/ACT inhaler Commonly known as: VENTOLIN HFA INHALE 2 PUFFS INTO THE LUNGS EVERY 4 HOURS AS NEEDED FOR WHEEZING OR SHORTNESS OF BREATH   amLODipine 5 MG tablet Commonly known as: NORVASC Take 1 tablet (5 mg total) by mouth daily.   aspirin EC 81 MG tablet Take 1 tablet (81 mg total) by mouth daily.   atorvastatin 80 MG tablet Commonly known as: LIPITOR Take 1 tablet (80 mg total) by mouth daily at 6 PM.   levETIRAcetam 500 MG tablet Commonly known as: Keppra Take 1 tablet (500 mg total) by mouth 2 (two) times daily.       ASK your doctor about these medications    escitalopram 5 MG tablet Commonly known as: Lexapro Take 1 tablet (5 mg total) by mouth daily. May increase to '10mg'$  after 1 week if tolerating.               Durable Medical Equipment  (From admission, onward)           Start     Ordered   04/10/21 1428  For home use  only DME Walker rolling  Once       Question Answer Comment  Walker: With [place]   Patient needs a walker to treat with the following condition Weakness      04/10/21 1427   04/10/21 1427  For home use only DME 3 n 1  Once        04/10/21 1427   04/10/21 1117  For home use only DME 3 n 1  Once        04/10/21 1116            Discharge Instructions: Please refer to Patient Instructions section of EMR for full details.  Patient was counseled important signs and symptoms that should prompt return to medical care,  changes in medications, dietary instructions, activity restrictions, and follow up appointments.   Follow-Up Appointments:  Follow-up Information     Garvin Fila, MD. Call.   Specialties: Neurology, Radiology Why: Please call to schedule a follow up in 4-6 weeks. Contact information: [address] [place] [address] [phone]         [facility]. Go on 04/19/2021.   Why: Please go to your scheduled appointment at 8:30AM. Please arrive at least 15 minutes early. Contact information: [place], [facility] Follow up.   Specialty: [place] Why: The home health agency will contact you for the first home visit Contact information: [place] [address] [phone]                 [place]

## 2021-04-10 NOTE — Progress Notes (Signed)
Family Medicine Teaching Service Daily Progress Note Intern Pager: [phone]  Patient name: [name] Medical record number: [id number] Date of birth: 11/29/1950 Age: 70 y.o. Gender: female  Primary Care Provider: Shary Key, DO Consultants: Neurology Code Status: Full  Pt Overview and Major Events to Date:  8/9: admitted  Assessment and Plan: [name] is a 70 year old female presenting with new onset seizure.  Past medical history significant for CVA, asthma, chronic bronchitis, chronic back pain, COPD (without oxygen requirement), GERD, psoriasis, hypertension, tobacco abuse.  Seizure  AMS MRI brain with and without contrast did not reveal any acute intracranial abnormality.  All testing at this time has revealed within normal limits with the exception of UDS revealing positive for THC. Pt is in pre-diabetic range and TSH normal.  -Neurology following as needed, appreciate recommendations -PT and OT eval and treat -EEG pending -Continue Keppra 500 mg every 12 hours -Seizure precautions, fall precautions -Driving precautions -Outpatient neuro follow-up -HIV and RPR ordered  Hypertension Blood pressures still frequently elevated at this time. As there is no pressure demand for ischemic pathology, I believe it is appropriate to restart home BP medications today.  -Start Amlodipine '5mg'$  daily  Abdominal pain  history of H. Pylori Patient does not have any abdominal pain today and has not had abdominal pain recently.  Does not require pain management or MRI abdomen at this time. -Outpatient test of cure for H. pylori  COPD Patient is active smoker currently requires inhaler at home.  Not requiring any oxygen at this time.  No additional management currently as she is not short of breath.  PAD s/p common femoral endarterectomy bypass  aortic atherosclerosis Lipid panel notable for LDL cholesterol elevated at 153 goal less than 70 and cholesterol elevated at  214. -Start atorvastatin 80 mg daily  Headache history No headache at this time.  Not requiring any current management.  Tobacco abuse  history of marijuana use Patient states she smoked marijuana on Monday (1 day prior to seizure onset).  UDS was also positive for THC.  FEN/GI: N.p.o. until more alert PPx: Lovenox Dispo:Home today or tomorrow. Barriers include EEG testing.   Subjective:  Patient was awake and alert during rounds.  She did not recall any events before the first seizure or after and was unsure what happened during her hospital course.  She is not in any pain right now, no difficulty breathing, no abdominal pain.  She states she does not take any sort of medications except inhaler for her breathing and Tylenol.  It has been over a year since her last doctor appointment.  Objective: Temp:  [98.3 F (36.8 C)-99.5 F (37.5 C)] 99.5 F (37.5 C) (08/10 0200) Pulse Rate:  [75-113] 87 (08/10 0200) Resp:  [15-26] 19 (08/10 0200) BP: (128-172)/(66-92) 128/75 (08/10 0200) SpO2:  [93 %-100 %] 99 % (08/10 0200) Weight:  [59 kg] 59 kg (08/09 1319) Physical Exam: General: Awake, alert, no acute distress Cardiovascular: RRR, no murmurs auscultated Respiratory: No increased work of breathing, wheezing bilaterally Abdomen: Soft, nontender, normoactive bowel sounds Extremities: Thin appearing, no edema present  Laboratory: Recent Labs  Lab 04/09/21 1341  WBC 9.6  HGB 12.5  HCT 38.9  PLT 226   Recent Labs  Lab 04/09/21 1341  NA 137  K 5.3*  CL 106  CO2 22  BUN 14  CREATININE 0.99  CALCIUM 9.4  PROT 7.1  BILITOT 1.2  ALKPHOS 83  ALT 13  AST 29  GLUCOSE 140*  Drugs of Abuse     Component Value Date/Time   LABOPIA NONE DETECTED 04/09/2021 2000   COCAINSCRNUR NONE DETECTED 04/09/2021 2000   LABBENZ NONE DETECTED 04/09/2021 2000   AMPHETMU NONE DETECTED 04/09/2021 2000   THCU POSITIVE (A) 04/09/2021 2000   LABBARB NONE DETECTED 04/09/2021 2000       Imaging/Diagnostic Tests: MRI brain with/without contrast: No acute intracranial abnormality.  Small area of developing encephalomalacia with hemosiderin in the right superior occipital lobe corresponding to the recent CT finding.  Underlying chronic cerebellar infarcts with evidence of currently poor flow or occlusion of the distal right vertebral artery.  Chronic cerebral white matter disease stable since last year. EEG pending  Wells Guiles, DO 04/10/2021, 6:30 AM PGY-1, [place] Intern pager: [phone], text pages welcome

## 2021-04-10 NOTE — Progress Notes (Signed)
Chart review note  Pt seen and evaluated by Dr. Curly Shores from our service overnight. Follow up EEG, MRI requested.   MRI reviewed. IMPRESSION: 1.  No acute intracranial abnormality. 2. Small area of developing encephalomalacia with hemosiderin in the right superior occipital lobe corresponds to the recent CT finding. 3. Underlying chronic cerebellar infarcts with evidence of chronically poor flow or occlusion in the distal right vertebral artery. 4. Chronic cerebral white matter disease is stable since last year.  EEG-normal    Updated recs:  Continue Keppra 500 twice daily for seizures. Continue aspirin 81 from a stroke prevention standpoint Follow-up with Dr. Fara Olden neurology in 4 to 6 weeks  [place] Per [facility] statutes, patients with seizures are not allowed to drive until they have been seizure-free for six months.   Use caution when using heavy equipment or power tools. Avoid working on ladders or at heights. Take showers instead of baths. Ensure the water temperature is not too high on the home water heater. Do not go swimming alone. Do not lock yourself in a room alone (i.e. bathroom). When caring for infants or small children, sit down when holding, feeding, or changing them to minimize risk of injury to the child in the event you have a seizure. Maintain good sleep hygiene. Avoid alcohol.    If patient has another seizure, call 911 and bring them back to the ED if: A.  The seizure lasts longer than 5 minutes.      B.  The patient doesn't wake shortly after the seizure or has new problems such as difficulty seeing, speaking or moving following the seizure C.  The patient was injured during the seizure D.  The patient has a temperature over 102 F (39C) E.  The patient vomited during the seizure and now is having trouble breathing     -- Amie Portland, MD Neurologist [facility] Pager: [phone]

## 2021-04-10 NOTE — Procedures (Signed)
Patient Name: [name]  MRN: [id number]  Epilepsy Attending: Lora Havens  Referring Physician/Provider: Dr Sharion Settler Date: 04/10/2021 Duration: 23.24 mins  Patient history: 70 year old woman with new onset seizures. EEG to evaluate for seizure  Level of alertness: Awake, asleep  AEDs during EEG study: LEV  Technical aspects: This EEG study was done with scalp electrodes positioned according to the 10-20 International system of electrode placement. Electrical activity was acquired at a sampling rate of '500Hz'$  and reviewed with a high frequency filter of '70Hz'$  and a low frequency filter of '1Hz'$ . EEG data were recorded continuously and digitally stored.   Description: The posterior dominant rhythm consists of 8.5 Hz activity of moderate voltage (25-35 uV) seen predominantly in posterior head regions, symmetric and reactive to eye opening and eye closing. Sleep was characterized by vertex waves, sleep spindles (12 to 14 Hz), maximal frontocentral region. Sharp transients were noted with generalized onset, predominantly in sleep. Hyperventilation and photic stimulation were not performed.     IMPRESSION: This study is within normal limits. No seizures or epileptiform discharges were seen throughout the recording.  If suspicion for ictal- interictal activity remains a concern, a prolonged study can be considered.    Pilar Westergaard Barbra Sarks

## 2021-04-10 NOTE — ED Notes (Signed)
Patient transported to MRI 

## 2021-04-10 NOTE — ED Notes (Signed)
Report given to Calton Dach, RN of [phone]

## 2021-04-10 NOTE — ED Notes (Signed)
Placed patient on the bedpan patient is now off with call bell in reach and family at bedside

## 2021-04-10 NOTE — Progress Notes (Signed)
FPTS Interim Night Progress Note  S:Patient now in MRI.  Rounded with primary night RN.  Reports patient having headache.  More alert and now following commands.  No other concerns voiced.    O: Today's Vitals   04/10/21 0015 04/10/21 0017 04/10/21 0200 04/10/21 0241  BP: (!) 155/76  128/75   Pulse: 94 100 87   Resp: '18 18 19   '$ Temp:   99.5 F (37.5 C)   TempSrc:   Oral   SpO2: 97% 98% 99%   Weight:      Height:      PainSc:    8       A/P: Follow up MRI Tylenol 650 mg q6h prn for headache Continue to monitor for seizure activity  Carollee Leitz MD PGY-3, [place] Medicine Service pager [phone]

## 2021-04-10 NOTE — Progress Notes (Signed)
EEG completed, results pending. 

## 2021-04-10 NOTE — Evaluation (Addendum)
Physical Therapy Evaluation Patient Details Name: [name] MRN: [id number] DOB: 06-20-1951 Today's Date: 04/10/2021   History of Present Illness  Pt is a 70 y/o female admitted 8/9 secondary to seizure activity. MRI negative. PMH includes CVA, asthma, COPD, HTN, and tobacco use.  Clinical Impression  Pt admitted secondary to problem above with deficits below. Cognitive deficits noted throughout, but unsure of baseline as no family present. Mild unsteadiness noted and requiring min guard for safety. Educated about pt having assist at d/c and pt reports she can stay with her daughter. Recommending HHPT at d/c to address current deficits. Will continue to follow acutely.     Follow Up Recommendations Home health PT;Supervision/Assistance - 24 hour (24/7 initially)    Equipment Recommendations  Rolling walker   Recommendations for Other Services       Precautions / Restrictions Precautions Precautions: Fall Restrictions Weight Bearing Restrictions: No      Mobility  Bed Mobility Overal bed mobility: Needs Assistance Bed Mobility: Supine to Sit;Sit to Supine     Supine to sit: Supervision Sit to supine: Supervision   General bed mobility comments: Supervision for safety and line management.    Transfers Overall transfer level: Needs assistance Equipment used: None Transfers: Sit to/from Stand Sit to Stand: Min guard         General transfer comment: Min guard for safety to stand from higher stretcher  Ambulation/Gait Ambulation/Gait assistance: Min guard Gait Distance (Feet): 125 Feet Assistive device: None;IV Pole Gait Pattern/deviations: Step-through pattern;Decreased stride length Gait velocity: Decreased   General Gait Details: Pt initially using IV pole for support, but had difficulty managing, so then did not use any UE support. Mild unsteadiness noted and required min guard for safety. Safety cues throughout.  Stairs            Wheelchair  Mobility    Modified Rankin (Stroke Patients Only)       Balance Overall balance assessment: Needs assistance Sitting-balance support: No upper extremity supported;Feet supported Sitting balance-Leahy Scale: Good     Standing balance support: No upper extremity supported;During functional activity Standing balance-Leahy Scale: Fair                               Pertinent Vitals/Pain Pain Assessment: No/denies pain    Home Living Family/patient expects to be discharged to:: Private residence Living Arrangements: Alone Available Help at Discharge: Family;Available 24 hours/day Type of Home: Apartment Home Access: Stairs to enter Entrance Stairs-Rails: Psychiatric nurse of Steps: flight Home Layout: One level Home Equipment: None      Prior Function Level of Independence: Independent               Hand Dominance   Dominant Hand: Right    Extremity/Trunk Assessment   Upper Extremity Assessment Upper Extremity Assessment: Defer to OT evaluation    Lower Extremity Assessment Lower Extremity Assessment: Generalized weakness    Cervical / Trunk Assessment Cervical / Trunk Assessment: Normal  Communication   Communication: No difficulties  Cognition Arousal/Alertness: Awake/alert Behavior During Therapy: WFL for tasks assessed/performed Overall Cognitive Status: No family/caregiver present to determine baseline cognitive functioning                                 General Comments: Initially reporting it was June, but able to correct. Reports it's 2021. Did not know why she was  here and does not remember any events leading up to seizure. Tended to repeat herself during session.      General Comments General comments (skin integrity, edema, etc.): No family presnet    Exercises     Assessment/Plan    PT Assessment Patient needs continued PT services  PT Problem List Decreased strength;Decreased activity  tolerance;Decreased balance;Decreased mobility;Decreased cognition       PT Treatment Interventions Gait training;Stair training;Functional mobility training;Therapeutic exercise;Therapeutic activities;Balance training;Patient/family education;DME instruction    PT Goals (Current goals can be found in the Care Plan section)  Acute Rehab PT Goals Patient Stated Goal: to go home PT Goal Formulation: With patient Time For Goal Achievement: 04/24/21 Potential to Achieve Goals: Good    Frequency Min 3X/week   Barriers to discharge        Co-evaluation               AM-PAC PT "6 Clicks" Mobility  Outcome Measure Help needed turning from your back to your side while in a flat bed without using bedrails?: None Help needed moving from lying on your back to sitting on the side of a flat bed without using bedrails?: None Help needed moving to and from a bed to a chair (including a wheelchair)?: A Little Help needed standing up from a chair using your arms (e.g., wheelchair or bedside chair)?: A Little Help needed to walk in hospital room?: A Little Help needed climbing 3-5 steps with a railing? : A Little 6 Click Score: 20    End of Session Equipment Utilized During Treatment: Gait belt Activity Tolerance: Patient tolerated treatment well Patient left: in bed;with call bell/phone within reach (on stretcher in ED with seizure pads on) Nurse Communication: Mobility status PT Visit Diagnosis: Other abnormalities of gait and mobility (R26.89);Unsteadiness on feet (R26.81)    Time: [id number] PT Time Calculation (min) (ACUTE ONLY): 17 min   Charges:   PT Evaluation $PT Eval Moderate Complexity: 1 Mod          [name], PT, DPT  Acute Rehabilitation Services  Pager: [phone] Office: [phone]   [name] 04/10/2021, 10:25 AM

## 2021-04-10 NOTE — Discharge Instructions (Addendum)
Dear [name],   Thank you so much for allowing [place] to be part of your care!  You were admitted to [facility] for seizure activity. You were evaluted by EEG that did not show any further seizure activity. The neurology team has recommended that you continue to take Keppra '500mg'$  twice daily and continue to follow up with Dr. Leonie Man. Do NOT DRIVE.    POST-HOSPITAL & CARE INSTRUCTIONS Continue to take your Keppra '500mg'$  twice per day.  Please follow up with the Neurologist, Dr. Leonie Man in 4-6 weeks.  Please let PCP/Specialists know of any changes that were made.  Please see medications section of this packet for any medication changes.   DOCTOR'S APPOINTMENT & FOLLOW UP CARE INSTRUCTIONS  Future Appointments  Date Time Provider Department Center  04/19/2021  8:55 AM ACCESS TO CARE POOL [facility]  Garvin Fila, MD Neurology (P): [place], [address] [place] [address]  RETURN PRECAUTIONS: Please return to care if you experience seizure activity lasting longer than 5 minutes or become confused after a seizure.   SEIZURE PRECAUTIONS Per [facility] statutes, patients with seizures are not allowed to drive until they have been seizure-free for six months.   Use caution when using heavy equipment or power tools. Avoid working on ladders or at heights. Take showers instead of baths. Ensure the water temperature is not too high on the home water heater. Do not go swimming alone. Do not lock yourself in a room alone (i.e. bathroom). When caring for infants or small children, sit down when holding, feeding, or changing them to minimize risk of injury to the child in the event you have a seizure. Maintain good sleep hygiene. Avoid alcohol.   If patient has another seizure, call 911 and bring them back to the ED if: A.  The seizure lasts longer than 5 minutes.      B.  The patient doesn't wake shortly after the seizure or has new problems such as difficulty  seeing, speaking or moving following the seizure C.  The patient was injured during the seizure D.  The patient has a temperature over 102 F (39C) E.  The patient vomited during the seizure and now is having trouble breathing      Take care and be well!  [facility]  [place], [place] [address] [phone]

## 2021-04-10 NOTE — Progress Notes (Signed)
Occupational Therapy Evaluation Patient Details Name: [name] MRN: [id number] DOB: 10-Jun-1951 Today's Date: 04/10/2021    History of Present Illness Pt is a 70 y/o female admitted 8/9 secondary to seizure activity. MRI negative. PMH includes CVA, asthma, COPD, HTN, and tobacco use.   Clinical Impression   PTA pt lives alone independently, but is around family frequently. Pt drives and independent with IADL tasks. Pt scored an 11 on the Short Blessed Test, placing her in the impaired category for cognition. Scored a 15 on the DGI, indicating risk for falls. Discussed with daughter and recommend initial 24/7 S, using a RW for mobility and and a 3in1 for a shower chair. Began education with daughter regarding reducing risk of falls, need for S with all mobility, ADL and medication/financial management in addition to refraining from driving. Daughter verbalized understanding. Will follow acutely but do not anticipate the need for OT follow up.     Follow Up Recommendations  No OT follow up;Supervision/Assistance - 24 hour    Equipment Recommendations  3 in 1 bedside commode;Other (comment) (RW)    Recommendations for Other Services Speech consult (cognitive linguistic eval)     Precautions / Restrictions Precautions Precautions: Fall Restrictions Weight Bearing Restrictions: No      Mobility Bed Mobility Overal bed mobility: Modified Independent     Transfers Overall transfer level: Needs assistance Equipment used: None Transfers: Sit to/from Stand Sit to Stand: Min guard         General transfer comment:     Balance Overall balance assessment: Needs assistance Sitting-balance support: No upper extremity supported;Feet supported Sitting balance-Leahy Scale: Good     Standing balance support: No upper extremity supported;During functional activity Standing balance-Leahy Scale: Fair                   Standardized Balance Assessment Standardized Balance  Assessment : Dynamic Gait Index   Dynamic Gait Index Level Surface: Mild Impairment Change in Gait Speed: Mild Impairment Gait with Horizontal Head Turns: Mild Impairment Gait with Vertical Head Turns: Mild Impairment Gait and Pivot Turn: Moderate Impairment Step Over Obstacle: Mild Impairment Step Around Obstacles: Mild Impairment Steps: Mild Impairment Total Score: 15  - indicating at risk for falls     ADL either performed or assessed with clinical judgement   ADL Overall ADL's : Needs assistance/impaired     Grooming: Set up   Upper Body Bathing: Set up   Lower Body Bathing: Min guard   Upper Body Dressing : Set up   Lower Body Dressing: Min guard   Toilet Transfer: Minimal assistance   Toileting- Clothing Manipulation and Hygiene: Supervision/safety       Functional mobility during ADLs: Cueing for safety;Minimal assistance       Vision Baseline Vision/History: Wears glasses Wears Glasses: Reading only Patient Visual Report: No change from baseline Vision Assessment?: No apparent visual deficits     Perception     Praxis      Pertinent Vitals/Pain Pain Assessment: No/denies pain     Hand Dominance Right   Extremity/Trunk Assessment Upper Extremity Assessment Upper Extremity Assessment: Overall WFL for tasks assessed   Lower Extremity Assessment Lower Extremity Assessment: Defer to PT evaluation   Cervical / Trunk Assessment Cervical / Trunk Assessment: Normal   Communication Communication Communication: No difficulties   Cognition Arousal/Alertness: Awake/alert Behavior During Therapy: WFL for tasks assessed/performed Overall Cognitive Status: Impaired/Different from baseline Area of Impairment: Orientation;Attention;Memory;Safety/judgement;Awareness;Problem solving  Orientation Level: Disoriented to;Situation;Time ("I had a stroke"; June 2021) Current Attention Level: Selective Memory: Decreased short-term memory    Safety/Judgement: Decreased awareness of safety;Decreased awareness of deficits Awareness: Emergent Problem Solving: Slow processing General Comments: Scored an 11 on the Short Blessed Test which places her in the impaired category (>10)   General Comments  No family presnet    Exercises     Shoulder Instructions      Home Living Family/patient expects to be discharged to:: Private residence Living Arrangements: Alone Available Help at Discharge: Family;Available 24 hours/day Type of Home: Apartment Home Access: Stairs to enter [employer] of Steps: flight Entrance Stairs-Rails: Right;Left Home Layout: One level     Bathroom Shower/Tub: Teacher, early years/pre: Standard Bathroom Accessibility: Yes   Home Equipment: None          Prior Functioning/Environment Level of Independence: Independent                 OT Problem List: Decreased activity tolerance;Impaired balance (sitting and/or standing);Decreased cognition;Decreased safety awareness;Decreased knowledge of use of DME or AE      OT Treatment/Interventions: Self-care/ADL training;DME and/or AE instruction;Therapeutic activities;Cognitive remediation/compensation;Patient/family education;Balance training    OT Goals(Current goals can be found in the care plan section) Acute Rehab OT Goals Patient Stated Goal: to go home OT Goal Formulation: With patient/family Time For Goal Achievement: 04/24/21 Potential to Achieve Goals: Good  OT Frequency: Min 2X/week   Barriers to D/C:            Co-evaluation              AM-PAC OT "6 Clicks" Daily Activity     Outcome Measure Help from another person eating meals?: None Help from another person taking care of personal grooming?: A Little Help from another person toileting, which includes using toliet, bedpan, or urinal?: A Little Help from another person bathing (including washing, rinsing, drying)?: A Little Help from another  person to put on and taking off regular upper body clothing?: A Little Help from another person to put on and taking off regular lower body clothing?: A Little 6 Click Score: 19   End of Session Equipment Utilized During Treatment: Gait belt Nurse Communication: Mobility status  Activity Tolerance: Patient tolerated treatment well Patient left: in bed;with call bell/phone within reach;with family/visitor present  OT Visit Diagnosis: Unsteadiness on feet (R26.81);Other symptoms and signs involving cognitive function                Time: 1030-1053 OT Time Calculation (min): 23 min Charges:  OT General Charges $OT Visit: 1 Visit OT Evaluation $OT Eval Moderate Complexity: Welcome, OT/L   Acute OT Clinical Specialist Acute Rehabilitation Services Pager [phone] Office [phone]   [facility] 04/10/2021, 11:05 AM

## 2021-04-10 NOTE — Hospital Course (Addendum)
[name] is a 70 y.o. female who presented with new onset seizure. PMH is significant for CVA, asthma, chronic bronchitis, chronic back pain, COPD, GERD, psoriasis, HTN, tobacco abuse. Below is her hospital course listed by problem.  For more information refer to her H&P.  New-onset Seizure Presented via EMS for new onset seizure that occurred at home.  In ED, vital stable.  Patient was postictal.  Labs were unremarkable.  UDS was positive for THC.  CT head with late subacute to chronic infarct within the superior right occipital lobe.  MRI brain with encephalomalacia.  Patient had another brief 45 seconds to 1 minute episode of seizure-like activity in the ED.  She did not require Ativan.  Neurology consulted and loaded her with Keppra.  Patient continued on Keppra 500 mg twice daily. EEG within normal limits. No further seizure activity while admitted. Will follow up with Neurology outpatient.  No driving because of seizure precautions was emphasized before discharge.  HLD  Hx Prior CVA Initial blood pressure medications were held in the setting of postictal state and concern for possible new seizure (prior to MRI being obtained). Received 300 mg rectal ASA x1. She was restarted on her home BP medication prior to d/c. Lipid panel with LDL 153. She was restarted on her home Atorvastatin 80 mg and ASA 81 mg.   Malnutrition Reported weight loss and decreased appetite.  Per chart review her weight appears stable.   Other problems chronic and stable.   PCP Follow Up Ensure she is complaint with medications- especially Keppra, ASA, BP and cholesterol medications. Hx of COPD but only reported being on Albuterol. Appears she was previously on Trelegy Ellipta.  Ensure follow up with Neurology. Counsel on [place] cessation. Reinforce no driving given new-onset seizures.

## 2021-04-10 NOTE — Progress Notes (Addendum)
Discharge instructions reviewed with  pt and here daughter.  Copy of instructions given to pt. Script for Keppra filled by [facility] and was given to pt (in room with pt and daughter informed).  Daughter and pt also informed of 2 other meds refill scripts sent to her pharmacy also.  Pt eating dinner at this time, will get dressed and call front desk when she is done eating and ready to go.

## 2021-04-10 NOTE — ED Notes (Addendum)
Patient just returned from [place].CLEAN UP PLACE PUR-WICK IN PLACE CHANGE GOWN AND CLEAN CHUX ,PLACE WARM BLANKETS ON PATIENTS

## 2021-04-10 NOTE — Progress Notes (Signed)
Pt dressed and ready, pt d/c'd via wheelchair with belongings. Escorted by unit NT. Pt d/c'd with equipment, rolling walker and 3 in 1 BSC.

## 2021-04-10 NOTE — TOC Transition Note (Addendum)
Transition of Care [facility]) - CM/SW Discharge Note   Patient Details  Name: [name] MRN: [id number] Date of Birth: 30-Sep-1950  Transition of [facility]) CM/SW Contact:  Pollie Friar, RN Phone Number: 04/10/2021, 2:32 PM   Clinical Narrative:    Patient is discharging home with [facility] services through [place]. Information on the AVS.  Walker and 3 in 1 for home to be delivered to the room per [employer].  Medications for home to be delivered to the room per [place]. Pt has transportation home.   Final next level of care: Home w Home Health Services Barriers to Discharge: No Barriers Identified   Patient Goals and CMS Choice   CMS Medicare.gov Compare Post Acute Care list provided to:: Patient Choice offered to / list presented to : Patient  Discharge Placement                       Discharge Plan and Services                DME Arranged: 3-N-1, Walker rolling DME Agency: [employer] Date DME Agency Contacted: 04/10/21   Representative spoke with at DME Agency: Freda Munro [facility] Arranged: PT [place]: [place] Date [place]: 04/10/21   Representative spoke with at [place]: [place] ([place]) Interventions     Readmission Risk Interventions No flowsheet data found.

## 2021-04-11 ENCOUNTER — Telehealth: Payer: Self-pay

## 2021-04-11 DIAGNOSIS — J449 Chronic obstructive pulmonary disease, unspecified: Secondary | ICD-10-CM

## 2021-04-11 LAB — RPR: RPR Ser Ql: NONREACTIVE

## 2021-04-11 NOTE — Telephone Encounter (Signed)
Jim from [facility] calling for PT verbal orders as follows:  1 time(s) weekly for 1 week(s), then 2 time(s) weekly for 3 week(s). Also requesting medical social worker evaluation.   PT also reports that patient has been having productive cough with green sputum. She has been trying OTC cough medications with little improvement. States that patient had normal O2 saturation at beginning of therapy, however, dropped to mid 70's after ambulating 200 feet.   Attempted to call patient back, however, phone was not in service. Called patient's sister who will have sister call back to office regarding concern.   Verbal orders given per [facility] protocol  Talbot Grumbling, RN

## 2021-04-18 NOTE — Patient Instructions (Addendum)
It was wonderful to meet you today. Thank you for allowing me to be a part of your care. Below is a short summary of what we discussed at your visit today:  Neurology follow up  Garvin Fila, MD Garvin Fila, MD  Neurology Radiology [phone] [place] [address]  -Call the above office number to schedule a neurology follow up appointment.   -The neurologist will be the one to tell you about continuing or stopping your Keppra. -Please continue to take your aspirin 81 mg tablet every day  Abdominal pain -H. pylori breath test today -If the results are normal, I will send you a letter or MyChart message. If the results are abnormal, I will give you a call.    Weight loss - Please make a follow-up appointment with your PCP to discuss this and look into it further  Please bring all of your medications to every appointment!  If you have any questions or concerns, please do not hesitate to contact us via phone or MyChart message.   Ezequiel Essex, MD

## 2021-04-19 ENCOUNTER — Ambulatory Visit (INDEPENDENT_AMBULATORY_CARE_PROVIDER_SITE_OTHER): Payer: 59 | Admitting: Family Medicine

## 2021-04-19 ENCOUNTER — Other Ambulatory Visit: Payer: Self-pay

## 2021-04-19 VITALS — BP 112/58 | HR 88

## 2021-04-19 DIAGNOSIS — Z8619 Personal history of other infectious and parasitic diseases: Secondary | ICD-10-CM

## 2021-04-19 DIAGNOSIS — I63449 Cerebral infarction due to embolism of unspecified cerebellar artery: Secondary | ICD-10-CM

## 2021-04-19 DIAGNOSIS — J449 Chronic obstructive pulmonary disease, unspecified: Secondary | ICD-10-CM

## 2021-04-19 DIAGNOSIS — J432 Centrilobular emphysema: Secondary | ICD-10-CM

## 2021-04-19 DIAGNOSIS — I739 Peripheral vascular disease, unspecified: Secondary | ICD-10-CM

## 2021-04-19 DIAGNOSIS — R110 Nausea: Secondary | ICD-10-CM

## 2021-04-19 DIAGNOSIS — R634 Abnormal weight loss: Secondary | ICD-10-CM | POA: Diagnosis not present

## 2021-04-19 DIAGNOSIS — Z1211 Encounter for screening for malignant neoplasm of colon: Secondary | ICD-10-CM

## 2021-04-19 DIAGNOSIS — Z742 Need for assistance at home and no other household member able to render care: Secondary | ICD-10-CM

## 2021-04-19 DIAGNOSIS — I700 Atherosclerosis of aorta: Secondary | ICD-10-CM

## 2021-04-19 DIAGNOSIS — Z1231 Encounter for screening mammogram for malignant neoplasm of breast: Secondary | ICD-10-CM

## 2021-04-19 DIAGNOSIS — R569 Unspecified convulsions: Secondary | ICD-10-CM

## 2021-04-19 DIAGNOSIS — R109 Unspecified abdominal pain: Secondary | ICD-10-CM

## 2021-04-19 DIAGNOSIS — B372 Candidiasis of skin and nail: Secondary | ICD-10-CM

## 2021-04-19 DIAGNOSIS — I70209 Unspecified atherosclerosis of native arteries of extremities, unspecified extremity: Secondary | ICD-10-CM

## 2021-04-19 MED ORDER — ONDANSETRON HCL 4 MG PO TABS: 4.0000 mg | ORAL_TABLET | Freq: Three times a day (TID) | ORAL | 0 refills | Status: DC | PRN

## 2021-04-19 MED ORDER — ALBUTEROL SULFATE HFA 108 (90 BASE) MCG/ACT IN AERS: INHALATION_SPRAY | RESPIRATORY_TRACT | 1 refills | Status: DC

## 2021-04-19 MED ORDER — NYSTATIN 100000 UNIT/GM EX POWD: 1.0000 "application " | Freq: Three times a day (TID) | CUTANEOUS | 0 refills | Status: AC

## 2021-04-19 MED ORDER — NYSTATIN 100000 UNIT/GM EX POWD: 1.0000 "application " | Freq: Three times a day (TID) | CUTANEOUS | 0 refills | Status: DC

## 2021-04-19 MED ORDER — TRELEGY ELLIPTA 100-62.5-25 MCG/INH IN AEPB: 1.0000 | INHALATION_SPRAY | Freq: Every day | RESPIRATORY_TRACT | 1 refills | Status: DC

## 2021-04-19 NOTE — Progress Notes (Signed)
    SUBJECTIVE:   CHIEF COMPLAINT / HPI:   Hospital follow-up -At discharge, patient instructed to take amlodipine 5 mg daily, aspirin 81 mg daily, atorvastatin 80 mg daily, and Keppra 500 mg twice daily -Patient reports good med compliance - No seizures since discharge  Abdominal pain, nausea - Patient reports chronic abdominal pain and nausea without vomiting - Established timeline to occur prior to hospitalization with seizure - Patient reports she was diagnosed with H. pylori "a long time ago" cannot recall exactly when - Patient cannot recall test of cure after being treated - Not currently on any any acid reducers prescribed or OTC - Patient also asks if this can be related to her weight loss  PERTINENT  PMH / PSH: CVA, PAD, asthma, COPD, emphysema, GERD, cyclical vomiting, lumbar disc herniation, depression, hypertensive retinopathy, protein calorie malnutrition, marijuana use  OBJECTIVE:   BP (!) 112/58   Pulse 88   SpO2 94%    PHQ-9:  Depression screen [facility] 2/9 09/14/2020 08/16/2020 08/16/2020  Decreased Interest 3 0 0  Down, Depressed, Hopeless 3 0 0  PHQ - 2 Score 6 0 0  Altered sleeping 3 - -  Tired, decreased energy 3 - -  Change in appetite 3 - -  Feeling bad or failure about yourself  3 - -  Trouble concentrating 3 - -  Moving slowly or fidgety/restless 3 - -  Suicidal thoughts 0 - -  PHQ-9 Score 24 - -  Difficult doing work/chores Very difficult - -  Some recent data might be hidden     GAD-7: No flowsheet data found.   Physical Exam General: Awake, alert, oriented Cardiovascular: Regular rate and rhythm, S1 and S2 present, no murmurs auscultated Respiratory: Lung fields clear to auscultation bilaterally Extremities: No bilateral lower extremity edema, palpable pedal and pretibial pulses bilaterally Neuro: Cranial nerves II through X grossly intact, able to move all extremities spontaneously  ASSESSMENT/PLAN:   Seizure [facility]) Patient has been  compliant with medications including Keppra, aspirin, amlodipine, atorvastatin.  Patient has not been driving per instructions.  No seizures since discharge.  Has not followed up with neurology, has no appointment set with them. - Continue Keppra until told otherwise by neurology - Provided neurologist clinic name and phone number, instructed to call for appointment - Continue aspirin, amlodipine, atorvastatin  Asthma-COPD overlap syndrome ([place]) During hospitalization, patient was noted to be using albuterol only.  Previously on Trelegy Ellipta, unsure why stopped.  At hospital follow-up, patient restarted on Trelegy Ellipta, prescription sent to pharmacy.  Weight loss Asked her to make an appointment with her PCP to follow-up on the weight loss.  No weight obtained at this visit. - Referral to GI for colonoscopy (we do not have record of previous colonoscopy) - Overdue for mammogram, place orders today (last mammogram 2014)  Abdominal pain Reports chronic abdominal pain and nausea.  Reports was tested positive for H. pylori "a long time ago" unable to disclose when.  Patient was treated, but cannot recall test of cure. - H. pylori breath test today - Zofran to pharmacy - Follow-up with PCP  Need for home health care Patient and daughter request referral for home health aide.  Referral placed today.     Ezequiel Essex, MD [place]

## 2021-04-20 DIAGNOSIS — Z742 Need for assistance at home and no other household member able to render care: Secondary | ICD-10-CM | POA: Insufficient documentation

## 2021-04-20 NOTE — Assessment & Plan Note (Signed)
Patient has been compliant with medications including Keppra, aspirin, amlodipine, atorvastatin.  Patient has not been driving per instructions.  No seizures since discharge.  Has not followed up with neurology, has no appointment set with them. - Continue Keppra until told otherwise by neurology - Provided neurologist clinic name and phone number, instructed to call for appointment - Continue aspirin, amlodipine, atorvastatin

## 2021-04-20 NOTE — Assessment & Plan Note (Signed)
During hospitalization, patient was noted to be using albuterol only.  Previously on Trelegy Ellipta, unsure why stopped.  At hospital follow-up, patient restarted on Trelegy Ellipta, prescription sent to pharmacy.

## 2021-04-20 NOTE — Assessment & Plan Note (Signed)
Reports chronic abdominal pain and nausea.  Reports was tested positive for H. pylori "a long time ago" unable to disclose when.  Patient was treated, but cannot recall test of cure. - H. pylori breath test today - Zofran to pharmacy - Follow-up with PCP

## 2021-04-20 NOTE — Assessment & Plan Note (Addendum)
Asked her to make an appointment with her PCP to follow-up on the weight loss.  No weight obtained at this visit. - Referral to GI for colonoscopy (we do not have record of previous colonoscopy) - Overdue for mammogram, place orders today (last mammogram 2014)

## 2021-04-20 NOTE — Assessment & Plan Note (Signed)
Patient and daughter request referral for home health aide.  Referral placed today.

## 2021-04-21 LAB — H. PYLORI BREATH TEST: H pylori Breath Test: NEGATIVE

## 2021-04-22 ENCOUNTER — Encounter: Payer: Self-pay | Admitting: Family Medicine

## 2021-04-25 ENCOUNTER — Other Ambulatory Visit: Payer: Self-pay | Admitting: Family Medicine

## 2021-04-25 DIAGNOSIS — R634 Abnormal weight loss: Secondary | ICD-10-CM

## 2021-04-25 DIAGNOSIS — H35032 Hypertensive retinopathy, left eye: Secondary | ICD-10-CM

## 2021-04-25 DIAGNOSIS — E43 Unspecified severe protein-calorie malnutrition: Secondary | ICD-10-CM

## 2021-04-25 DIAGNOSIS — I63449 Cerebral infarction due to embolism of unspecified cerebellar artery: Secondary | ICD-10-CM

## 2021-04-25 DIAGNOSIS — Z742 Need for assistance at home and no other household member able to render care: Secondary | ICD-10-CM

## 2021-04-25 DIAGNOSIS — R569 Unspecified convulsions: Secondary | ICD-10-CM

## 2021-04-25 DIAGNOSIS — H35031 Hypertensive retinopathy, right eye: Secondary | ICD-10-CM

## 2021-04-25 DIAGNOSIS — I739 Peripheral vascular disease, unspecified: Secondary | ICD-10-CM

## 2021-04-25 DIAGNOSIS — J449 Chronic obstructive pulmonary disease, unspecified: Secondary | ICD-10-CM

## 2021-04-25 NOTE — Progress Notes (Signed)
Updated referral for home health care services.  Ezequiel Essex, MD

## 2021-04-25 NOTE — Progress Notes (Signed)
Administrative staff attempted to call at my request to ask this patient to schedule follow-up with PCP for abdominal pain and weight loss.  Phone number was out of service.  This is a letter sent to her directly asking her to schedule an appointment.  Ezequiel Essex, MD

## 2021-05-07 ENCOUNTER — Encounter: Payer: Self-pay | Admitting: Neurology

## 2021-05-07 ENCOUNTER — Ambulatory Visit (INDEPENDENT_AMBULATORY_CARE_PROVIDER_SITE_OTHER): Payer: 59 | Admitting: Neurology

## 2021-05-07 VITALS — BP 144/82 | HR 81 | Ht 66.0 in | Wt 120.0 lb

## 2021-05-07 DIAGNOSIS — J449 Chronic obstructive pulmonary disease, unspecified: Secondary | ICD-10-CM | POA: Diagnosis not present

## 2021-05-07 DIAGNOSIS — G40309 Generalized idiopathic epilepsy and epileptic syndromes, not intractable, without status epilepticus: Secondary | ICD-10-CM | POA: Diagnosis not present

## 2021-05-07 MED ORDER — LEVETIRACETAM 500 MG PO TABS: 500.0000 mg | ORAL_TABLET | Freq: Two times a day (BID) | ORAL | 12 refills | Status: DC

## 2021-05-07 MED ORDER — ALBUTEROL SULFATE HFA 108 (90 BASE) MCG/ACT IN AERS: INHALATION_SPRAY | RESPIRATORY_TRACT | 1 refills | Status: DC

## 2021-05-07 NOTE — Progress Notes (Signed)
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  Patient presented to the clinic by herself, reports that she does not remember the event.  Reports that the only thing that she remembers is waking up from the hospital.  She said that family had told her that she had a seizure at the house and in the hospital.  She said that her daughter had told her that she woke up in the middle of the night, screaming and then she fell and started foaming at the mouth.  She never had a seizure event in her life.  This was the first lifetime event. Seizure risk factor include a history of stroke and grand son with seizures.  In the hospital she was loaded with Keppra, she had a brain MRI which showed her previous stroke and EEG which was normal.  She was continued on Keppra 500 mg twice daily and discharged home.  Patient stated since discharge she has been doing well, denies any seizure or seizure-like activity.  She is compliant with the Keppra 500 mg twice daily and denies any side effect from the medication.  She currently lives at home with her daughter and grandkids.  She does not drive and she is able to manage all daily activity.  She manages her defined her finances by her self.   Other issues brought today is her weight loss patient stated for the past 6 months she lost about 40 pounds and she is worried and concerned about that.  She has not followed up with her primary care doctor.      Handedness: Right handed   Seizure Type: Generalized convulsion  Current frequency: 1 seizure   Any injuries from seizures: No  Seizure risk factors: Grand daughter with seizure, strokes  Previous ASMs: None   Currenty ASMs: Levetiracetam   ASMs side effects: None   Brain Images: MRI Brain 04/10/2021 1.  No acute intracranial abnormality. 2. Small area of developing encephalomalacia with hemosiderin in the right superior occipital lobe corresponds to the recent CT finding. 3. Underlying chronic cerebellar infarcts with evidence of chronically poor flow or occlusion in the distal right vertebral artery. 4. Chronic cerebral white matter disease is stable since last year.  Previous EEGs: rEEG 04/10/2021: This study is within normal limits. No seizures or epileptiform discharges were seen throughout the recording.   OTHER MEDICAL CONDITIONS: Stroke, HTN, HLD, COPD  REVIEW OF SYSTEMS: Full 14 system review of systems performed and negative with exception of: as noted in the HPI  ALLERGIES: No Known Allergies  HOME MEDICATIONS: Outpatient Medications Prior to Visit  Medication Sig Dispense Refill   amLODipine (NORVASC) 5 MG tablet Take 1 tablet (5 mg total) by mouth daily. 90 tablet 1   aspirin EC 81 MG tablet Take 1 tablet (81 mg total) by mouth daily. 90 tablet 3   atorvastatin (LIPITOR) 80 MG tablet Take 1 tablet (80 mg total) by mouth daily at 6 PM. 90 tablet 3  Fluticasone-Umeclidin-Vilant (TRELEGY ELLIPTA) 100-62.5-25 MCG/INH AEPB Inhale 1 puff into the lungs daily. 60 each 1   nystatin (MYCOSTATIN/NYSTOP) powder Apply 1 application topically 3 (three) times daily. 15 g 0   ondansetron (ZOFRAN) 4 MG tablet Take 1 tablet (4 mg total) by mouth every 8 (eight) hours as needed for nausea or vomiting. 20 tablet 0   albuterol (VENTOLIN HFA) 108 (90 Base) MCG/ACT inhaler INHALE 2 PUFFS INTO THE LUNGS EVERY 4 HOURS AS NEEDED FOR WHEEZING OR SHORTNESS OF BREATH 18 g 1    levETIRAcetam (KEPPRA) 500 MG tablet Take 1 tablet (500 mg total) by mouth 2 (two) times daily. 60 tablet 0   No facility-administered medications prior to visit.    PAST MEDICAL HISTORY: Past Medical History:  Diagnosis Date   Abdominal pain, epigastric 02/23/2013   Abdominal pain, unspecified site 08/15/2013   Abnormal neurological exam 04/11/2016   Acute CVA (cerebrovascular accident) ([place]) 11/03/2019   Acute non-recurrent frontal sinusitis 05/21/2018   Asthma    Cataracts, bilateral 05/03/2014   Cephalalgia    Chronic bronchitis (HCC)    Chronic lower back pain    COPD (chronic obstructive pulmonary disease) (HCC)    Daily headache    De Quervain's tenosynovitis, left 11/09/2014   GERD (gastroesophageal reflux disease)    Hx of colonic polyps    Maxillary sinusitis    Nausea & vomiting    Orthopnea 09/28/2015   Reports of 5 pillow orthopnea (from 2-3) which began with cold symptoms. Could be due to cold but would like to rule out cardiac etiology. Also noted of 1 year history of dyspnea on exertion which only worsens when she gets a cold; this could be due to COPD however cardiac etiology should be considered. No JVD or LE edema on exam.  - ECHO ordered - will refer to cardiology if ECHO concerning for H   PSORIASIS 12/29/2006   Qualifier: Diagnosis of  By: Jobe Igo MD, David     Right leg pain 04/02/2016   Smoker     PAST SURGICAL HISTORY: Past Surgical History:  Procedure Laterality Date   ABDOMINAL AORTOGRAM W/LOWER EXTREMITY N/A 10/06/2016   Procedure: Abdominal Aortogram w/Lower Extremity;  Surgeon: Waynetta Sandy, MD;  Location: [facility];  Service: Cardiovascular;  Laterality: N/A;   ABDOMINAL AORTOGRAM W/LOWER EXTREMITY N/A 02/10/2018   Procedure: ABDOMINAL AORTOGRAM W/LOWER EXTREMITY;  Surgeon: Waynetta Sandy, MD;  Location: [facility];  Service: Cardiovascular;  Laterality: N/A;   ABDOMINAL HERNIA REPAIR     APPENDECTOMY     COLONOSCOPY W/  POLYPECTOMY  02/26/2005   FEMORAL-POPLITEAL BYPASS GRAFT Left 04/20/2018   Procedure: Left FEMORAL-POPLITEAL ARTERY Bypass Graft using Gore Propaten Vascular Graft;  Surgeon: Waynetta Sandy, MD;  Location: [facility];  Service: Vascular;  Laterality: Left;   HERNIA REPAIR     LOWER EXTREMITY ANGIOGRAPHY Bilateral 04/19/2018   Procedure: LOWER EXTREMITY ANGIOGRAPHY;  Surgeon: Waynetta Sandy, MD;  Location: [facility];  Service: Cardiovascular;  Laterality: Bilateral;   PERIPHERAL VASCULAR ATHERECTOMY Right 10/06/2016   Procedure: Peripheral Vascular Atherectomy;  Surgeon: Waynetta Sandy, MD;  Location: [facility];  Service: Cardiovascular;  Laterality: Right;   PERIPHERAL VASCULAR INTERVENTION Right 10/06/2016   Procedure: Peripheral Vascular Intervention;  Surgeon: Waynetta Sandy, MD;  Location: [facility];  Service: Cardiovascular;  Laterality: Right;  SFA   PERIPHERAL VASCULAR INTERVENTION Left 02/10/2018   Procedure: PERIPHERAL VASCULAR INTERVENTION;  Surgeon: Servando Snare  Harrell Gave, MD;  Location: [facility];  Service: Cardiovascular;  Laterality: Left;   TONSILLECTOMY  1960s   UPPER GASTROINTESTINAL ENDOSCOPY  03/22/2010, 02/13/2005   VAGINAL HYSTERECTOMY     partial    FAMILY HISTORY: Family History  Problem Relation Age of Onset   Cancer Other    Coronary artery disease Other    Cancer Mother    Hypertension Mother    Heart disease Mother    Cancer Father    Hypertension Father    Cancer Sister    Hypertension Sister     SOCIAL HISTORY: Social History   Socioeconomic History   Marital status: Single    Spouse name: Not on file   Number of children: 2   Years of education: 12   Highest education level: High school graduate  Occupational History   Not on file  Tobacco Use   Smoking status: Every Day    Packs/day: 0.50    Years: 47.00    Pack years: 23.50    Types: Cigarettes   Smokeless tobacco: Never    Tobacco comments:    3-4 cigarettes per day  Vaping Use   Vaping Use: Never used  Substance and Sexual Activity   Alcohol use: Not Currently    Alcohol/week: 0.0 standard drinks    Comment: Quit alcohol in 1992; "never really drank much alcohol"   Drug use: Yes    Types: Marijuana    Comment: 02/10/2018 "~ twice//wk"   Sexual activity: Not Currently  Other Topics Concern   Not on file  Social History Narrative   Patient lives alone in [place].    Patient has two adult children nearby.    Patient has never been married.    Patient enjoys riding motorcycles and she is part of a "biker club."   Patient enjoys shopping and going to sporting events.   Social Determinants of Health   Financial Resource Strain: Low Risk    Difficulty of Paying Living Expenses: Not hard at all  Food Insecurity: No Food Insecurity   Worried About Charity fundraiser in the Last Year: Never true   [place] in the Last Year: Never true  Transportation Needs: No Transportation Needs   Lack of Transportation (Medical): No   Lack of Transportation (Non-Medical): No  Physical Activity: Inactive   Days of Exercise per Week: 0 days   Minutes of Exercise per Session: 0 min  Stress: No Stress Concern Present   Feeling of Stress : Only a little  Social Connections: Moderately Integrated   Frequency of Communication with Friends and Family: More than three times a week   Frequency of Social Gatherings with Friends and Family: More than three times a week   Attends Religious Services: More than 4 times per year   Active Member of [employer] or Organizations: Yes   Attends Archivist Meetings: 1 to 4 times per year   Marital Status: Never married  Human resources officer Violence: Not At Risk   Fear of Current or Ex-Partner: No   Emotionally Abused: No   Physically Abused: No   Sexually Abused: No     PHYSICAL EXAM  GENERAL EXAM/CONSTITUTIONAL: Vitals:  Vitals:   05/07/21 1408  BP: (!)  144/82  Pulse: 81  Weight: 120 lb (54.4 kg)  Height: '5\' 6"'$  (1.676 m)   Body mass index is 19.37 kg/m. Wt Readings from Last 3 Encounters:  05/07/21 120 lb (54.4 kg)  04/09/21 130 lb (  59 kg)  09/12/20 126 lb 12.8 oz (57.5 kg)   Patient is in no distress; well developed, nourished and groomed; neck is supple  CARDIOVASCULAR: Examination of carotid arteries is normal; no carotid bruits Regular rate and rhythm, no murmurs Examination of peripheral vascular system by observation and palpation is normal  EYES: Pupils round and reactive to light, Visual fields full to confrontation, Extraocular movements intacts,  MUSCULOSKELETAL: Gait, strength, tone, movements noted in Neurologic exam below  NEUROLOGIC: MENTAL STATUS: awake, alert, oriented to person, place and time recent and remote memory intact normal attention and concentration language fluent, comprehension intact, naming intact fund of knowledge appropriate  CRANIAL NERVE:  2nd, 3rd, 4th, 6th - pupils equal and reactive to light, visual fields full to confrontation, extraocular muscles intact, no nystagmus 5th - facial sensation symmetric 7th - facial strength symmetric 8th - hearing intact 9th - palate elevates symmetrically, uvula midline 11th - shoulder shrug symmetric 12th - tongue protrusion midline  MOTOR:  normal bulk and tone, full strength in the BUE, BLE  SENSORY:  normal and symmetric to light touch, pinprick, temperature, vibration  COORDINATION:  finger-nose-finger, fine finger movements normal  REFLEXES:  deep tendon reflexes present and symmetric  GAIT/STATION:  normal    DIAGNOSTIC DATA (LABS, IMAGING, TESTING) - I reviewed patient records, labs, notes, testing and imaging myself where available.  Lab Results  Component Value Date   WBC 9.6 04/09/2021   HGB 12.5 04/09/2021   HCT 38.9 04/09/2021   MCV 95.6 04/09/2021   PLT 226 04/09/2021      Component Value Date/Time   NA 137  04/10/2021 0645   NA 138 09/12/2020 1456   K 3.7 04/10/2021 0645   CL 106 04/10/2021 0645   CO2 23 04/10/2021 0645   GLUCOSE 100 (H) 04/10/2021 0645   BUN 12 04/10/2021 0645   BUN 13 09/12/2020 1456   CREATININE 1.03 (H) 04/10/2021 0645   CREATININE 0.79 08/15/2013 1446   CALCIUM 9.1 04/10/2021 0645   PROT 7.1 04/09/2021 1341   PROT 7.3 09/12/2020 1456   ALBUMIN 4.1 04/09/2021 1341   ALBUMIN 4.7 09/12/2020 1456   AST 29 04/09/2021 1341   ALT 13 04/09/2021 1341   ALKPHOS 83 04/09/2021 1341   BILITOT 1.2 04/09/2021 1341   BILITOT <0.2 09/12/2020 1456   GFRNONAA 59 (L) 04/10/2021 0645   GFRAA 74 09/12/2020 1456   Lab Results  Component Value Date   CHOL 214 (H) 04/10/2021   HDL 45 04/10/2021   LDLCALC 153 (H) 04/10/2021   TRIG 80 04/10/2021   Lab Results  Component Value Date   HGBA1C 6.1 (H) 04/10/2021   Lab Results  Component Value Date   VITAMINB12 439 04/10/2021   Lab Results  Component Value Date   TSH 1.330 09/12/2020    Brain MRI 04/10/2021 1.  No acute intracranial abnormality. 2. Small area of developing encephalomalacia with hemosiderin in the right superior occipital lobe corresponds to the recent CT finding. 3. Underlying chronic cerebellar infarcts with evidence of chronically poor flow or occlusion in the distal right vertebral artery. 4. Chronic cerebral white matter disease is stable since last year.   Routine EEG 04/10/2021 This study is within normal limits. No seizures or epileptiform discharges were seen throughout the recording.  I personally reviewed brain Images and previous EEG reports.   ASSESSMENT AND PLAN  70 y.o. 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  Patient was noted to have a generalized convulsion at home and in the ED.  She was loaded initially with Keppra; her brain MRI showed  encephalomalacia from her previous stroke but no acute intracranial abnormality and her routine EEG was normal.  She has been discharged on Keppra 500 mg twice daily and reports compliance with medication and no side effect.  I will continue the Keppra 500 mg twice daily, will obtain a Keppra level today and follow-up in 6 months.  I have spent an extended amount of time discussing the diagnosis of epilepsy and what it means for patient, the fact that she should not drive for 6 months and patient reported currently she is not driving.  I also advised the patient to contact me for any seizure-like activity or any additional questions.   She reports her main problem currently is her weight loss, in the past 6 months she lost about 40 pounds and she is very concerned about that. I informed the patient to follow-up with her primary care doctor to have her weight loss evaluated.  She is comfortable with plan and will  call her primary care doctor soon as possible.  Follow-up with 12-month  1. Generalized idiopathic epilepsy and epileptic syndromes, not intractable, without status epilepticus ([facility]   2. Asthma-COPD overlap syndrome (HCC)       PLAN: Continue with Keppra 500 mg twice a day  Will obtain Keppra level today  Follow up in 6 months    Per [facility], patients with seizures are not allowed to drive until they have been seizure-free for six months.  Other recommendations include using caution when using heavy equipment or power tools. Avoid working on ladders or at heights. Take showers instead of baths.  Do not swim alone.  Ensure the water temperature is not too high on the home water heater. Do not go swimming alone. Do not lock yourself in a room alone (i.e. bathroom). When caring for infants or small children, sit down when holding, feeding, or changing them to minimize risk of injury to the child in the event you have a seizure. Maintain good sleep hygiene. Avoid alcohol.   Also recommend adequate sleep, hydration, good diet and minimize stress.   During the Seizure  - First, ensure adequate ventilation and place patients on the floor on their left side  Loosen clothing around the neck and ensure the airway is patent. If the patient is clenching the teeth, do not force the mouth open with any object as this can cause severe damage - Remove all items from the surrounding that can be hazardous. The patient may be oblivious to what's happening and may not even know what he or she is doing. If the patient is confused and wandering, either gently guide him/her away and block access to outside areas - Reassure the individual and be comforting - Call 911. In most cases, the seizure ends before EMS arrives. However, there are cases when seizures may last over 3 to 5 minutes. Or the individual may have developed breathing difficulties or severe injuries. If a pregnant patient or a person with diabetes develops a seizure, it is prudent to call an ambulance. - Finally, if the patient does not regain full consciousness, then call EMS. Most patients will remain confused for about 45 to 90 minutes after a seizure, so you must use judgment in calling for help. - Avoid restraints but make  sure the patient is in a bed with padded side rails - Place the individual in a lateral position with the neck slightly flexed; this will help the saliva drain from the mouth and prevent the tongue from falling backward - Remove all nearby furniture and other hazards from the area - Provide verbal assurance as the individual is regaining consciousness - Provide the patient with privacy if possible - Call for help and start treatment as ordered by the caregiver   After the Seizure (Postictal Stage)  After a seizure, most patients experience confusion, fatigue, muscle pain and/or a headache. Thus, one should permit the individual to sleep. For the next few days, reassurance is essential. Being calm  and helping reorient the person is also of importance.  Most seizures are painless and end spontaneously. Seizures are not harmful to others but can lead to complications such as stress on the lungs, brain and the heart. Individuals with prior lung problems may develop labored breathing and respiratory distress.     Orders Placed This Encounter  Procedures   Levetiracetam level    Meds ordered this encounter  Medications   levETIRAcetam (KEPPRA) 500 MG tablet    Sig: Take 1 tablet (500 mg total) by mouth 2 (two) times daily.    Dispense:  60 tablet    Refill:  12   albuterol (VENTOLIN HFA) 108 (90 Base) MCG/ACT inhaler    Sig: INHALE 2 PUFFS INTO THE LUNGS EVERY 4 HOURS AS NEEDED FOR WHEEZING OR SHORTNESS OF BREATH    Dispense:  18 g    Refill:  1    Return in about 6 months (around 11/04/2021).    Alric Ran, MD 05/07/2021, 2:49 PM  [facility] [address] [place], [place] [address] [phone]

## 2021-05-07 NOTE — Patient Instructions (Signed)
Continue with Keppra 500 mg twice a day  Will obtain Keppra level today  Follow up in 6 months     Per [facility] statutes, patients with seizures are not allowed to drive until they have been seizure-free for six months.  Other recommendations include using caution when using heavy equipment or power tools. Avoid working on ladders or at heights. Take showers instead of baths.  Do not swim alone.  Ensure the water temperature is not too high on the home water heater. Do not go swimming alone. Do not lock yourself in a room alone (i.e. bathroom). When caring for infants or small children, sit down when holding, feeding, or changing them to minimize risk of injury to the child in the event you have a seizure. Maintain good sleep hygiene. Avoid alcohol.  Also recommend adequate sleep, hydration, good diet and minimize stress.   During the Seizure  - First, ensure adequate ventilation and place patients on the floor on their left side  Loosen clothing around the neck and ensure the airway is patent. If the patient is clenching the teeth, do not force the mouth open with any object as this can cause severe damage - Remove all items from the surrounding that can be hazardous. The patient may be oblivious to what's happening and may not even know what he or she is doing. If the patient is confused and wandering, either gently guide him/her away and block access to outside areas - Reassure the individual and be comforting - Call 911. In most cases, the seizure ends before EMS arrives. However, there are cases when seizures may last over 3 to 5 minutes. Or the individual may have developed breathing difficulties or severe injuries. If a pregnant patient or a person with diabetes develops a seizure, it is prudent to call an ambulance. - Finally, if the patient does not regain full consciousness, then call EMS. Most patients will remain confused for about 45 to 90 minutes after a seizure, so you must  use judgment in calling for help. - Avoid restraints but make sure the patient is in a bed with padded side rails - Place the individual in a lateral position with the neck slightly flexed; this will help the saliva drain from the mouth and prevent the tongue from falling backward - Remove all nearby furniture and other hazards from the area - Provide verbal assurance as the individual is regaining consciousness - Provide the patient with privacy if possible - Call for help and start treatment as ordered by the caregiver   After the Seizure (Postictal Stage)  After a seizure, most patients experience confusion, fatigue, muscle pain and/or a headache. Thus, one should permit the individual to sleep. For the next few days, reassurance is essential. Being calm and helping reorient the person is also of importance.  Most seizures are painless and end spontaneously. Seizures are not harmful to others but can lead to complications such as stress on the lungs, brain and the heart. Individuals with prior lung problems may develop labored breathing and respiratory distress.

## 2021-05-09 LAB — LEVETIRACETAM LEVEL: Levetiracetam Lvl: 26.4 ug/mL (ref 10.0–40.0)

## 2021-05-24 ENCOUNTER — Other Ambulatory Visit (HOSPITAL_BASED_OUTPATIENT_CLINIC_OR_DEPARTMENT_OTHER): Payer: Self-pay

## 2021-06-05 ENCOUNTER — Other Ambulatory Visit: Payer: Self-pay | Admitting: Family Medicine

## 2021-06-05 DIAGNOSIS — R110 Nausea: Secondary | ICD-10-CM

## 2021-07-02 ENCOUNTER — Telehealth: Payer: Self-pay

## 2021-07-02 NOTE — Telephone Encounter (Signed)
Called to discuss PREVAIL with patient and scheduled to come in on 07/09/21

## 2021-07-10 ENCOUNTER — Telehealth: Payer: Self-pay

## 2021-07-10 NOTE — Telephone Encounter (Signed)
Called patient to reschedule her appointment she canceled earlier this week. Message left stating that we could provide transportation for the patient to and from the visit and asked to call back to set up at time

## 2021-07-26 ENCOUNTER — Other Ambulatory Visit: Payer: Self-pay | Admitting: Family Medicine

## 2021-07-26 DIAGNOSIS — R110 Nausea: Secondary | ICD-10-CM

## 2021-08-23 ENCOUNTER — Other Ambulatory Visit: Payer: Self-pay | Admitting: Family Medicine

## 2021-08-23 DIAGNOSIS — R110 Nausea: Secondary | ICD-10-CM

## 2021-09-23 ENCOUNTER — Other Ambulatory Visit: Payer: Self-pay | Admitting: Family Medicine

## 2021-09-23 DIAGNOSIS — R110 Nausea: Secondary | ICD-10-CM

## 2021-10-05 ENCOUNTER — Other Ambulatory Visit: Payer: Self-pay | Admitting: Family Medicine

## 2021-10-07 ENCOUNTER — Encounter (HOSPITAL_COMMUNITY): Payer: Self-pay

## 2021-10-07 ENCOUNTER — Inpatient Hospital Stay (HOSPITAL_COMMUNITY)
Admission: EM | Admit: 2021-10-07 | Discharge: 2021-10-09 | DRG: 313 | Disposition: A | Discharge status: Home Health | Payer: 59 | Attending: Family Medicine | Admitting: Family Medicine

## 2021-10-07 ENCOUNTER — Emergency Department (HOSPITAL_COMMUNITY): Payer: 59

## 2021-10-07 ENCOUNTER — Other Ambulatory Visit: Payer: Self-pay

## 2021-10-07 ENCOUNTER — Inpatient Hospital Stay (HOSPITAL_COMMUNITY): Payer: 59

## 2021-10-07 DIAGNOSIS — E7800 Pure hypercholesterolemia, unspecified: Secondary | ICD-10-CM

## 2021-10-07 DIAGNOSIS — I739 Peripheral vascular disease, unspecified: Secondary | ICD-10-CM | POA: Diagnosis present

## 2021-10-07 DIAGNOSIS — I2510 Atherosclerotic heart disease of native coronary artery without angina pectoris: Secondary | ICD-10-CM | POA: Diagnosis present

## 2021-10-07 DIAGNOSIS — Z591 Inadequate housing: Secondary | ICD-10-CM | POA: Diagnosis not present

## 2021-10-07 DIAGNOSIS — Z720 Tobacco use: Secondary | ICD-10-CM | POA: Diagnosis present

## 2021-10-07 DIAGNOSIS — R569 Unspecified convulsions: Secondary | ICD-10-CM | POA: Diagnosis not present

## 2021-10-07 DIAGNOSIS — K219 Gastro-esophageal reflux disease without esophagitis: Secondary | ICD-10-CM | POA: Diagnosis present

## 2021-10-07 DIAGNOSIS — R079 Chest pain, unspecified: Principal | ICD-10-CM | POA: Diagnosis present

## 2021-10-07 DIAGNOSIS — Z7982 Long term (current) use of aspirin: Secondary | ICD-10-CM

## 2021-10-07 DIAGNOSIS — F1290 Cannabis use, unspecified, uncomplicated: Secondary | ICD-10-CM | POA: Diagnosis present

## 2021-10-07 DIAGNOSIS — R0602 Shortness of breath: Secondary | ICD-10-CM

## 2021-10-07 DIAGNOSIS — R112 Nausea with vomiting, unspecified: Secondary | ICD-10-CM | POA: Diagnosis present

## 2021-10-07 DIAGNOSIS — R110 Nausea: Secondary | ICD-10-CM

## 2021-10-07 DIAGNOSIS — I700 Atherosclerosis of aorta: Secondary | ICD-10-CM | POA: Diagnosis present

## 2021-10-07 DIAGNOSIS — Z681 Body mass index (BMI) 19 or less, adult: Secondary | ICD-10-CM

## 2021-10-07 DIAGNOSIS — J449 Chronic obstructive pulmonary disease, unspecified: Secondary | ICD-10-CM | POA: Diagnosis present

## 2021-10-07 DIAGNOSIS — J439 Emphysema, unspecified: Secondary | ICD-10-CM | POA: Diagnosis present

## 2021-10-07 DIAGNOSIS — Z8673 Personal history of transient ischemic attack (TIA), and cerebral infarction without residual deficits: Secondary | ICD-10-CM | POA: Diagnosis not present

## 2021-10-07 DIAGNOSIS — R9431 Abnormal electrocardiogram [ECG] [EKG]: Secondary | ICD-10-CM

## 2021-10-07 DIAGNOSIS — R54 Age-related physical debility: Secondary | ICD-10-CM | POA: Diagnosis present

## 2021-10-07 DIAGNOSIS — Z79899 Other long term (current) drug therapy: Secondary | ICD-10-CM

## 2021-10-07 DIAGNOSIS — R102 Pelvic and perineal pain: Secondary | ICD-10-CM | POA: Diagnosis not present

## 2021-10-07 DIAGNOSIS — R7303 Prediabetes: Secondary | ICD-10-CM | POA: Diagnosis present

## 2021-10-07 DIAGNOSIS — E43 Unspecified severe protein-calorie malnutrition: Secondary | ICD-10-CM | POA: Diagnosis present

## 2021-10-07 DIAGNOSIS — Z20822 Contact with and (suspected) exposure to covid-19: Secondary | ICD-10-CM | POA: Diagnosis present

## 2021-10-07 DIAGNOSIS — Z8249 Family history of ischemic heart disease and other diseases of the circulatory system: Secondary | ICD-10-CM | POA: Diagnosis not present

## 2021-10-07 DIAGNOSIS — I272 Pulmonary hypertension, unspecified: Secondary | ICD-10-CM | POA: Diagnosis present

## 2021-10-07 DIAGNOSIS — R1110 Vomiting, unspecified: Secondary | ICD-10-CM | POA: Diagnosis present

## 2021-10-07 DIAGNOSIS — G40909 Epilepsy, unspecified, not intractable, without status epilepticus: Secondary | ICD-10-CM | POA: Diagnosis present

## 2021-10-07 DIAGNOSIS — I639 Cerebral infarction, unspecified: Secondary | ICD-10-CM

## 2021-10-07 DIAGNOSIS — R634 Abnormal weight loss: Secondary | ICD-10-CM | POA: Diagnosis not present

## 2021-10-07 DIAGNOSIS — Z23 Encounter for immunization: Secondary | ICD-10-CM | POA: Diagnosis present

## 2021-10-07 DIAGNOSIS — R21 Rash and other nonspecific skin eruption: Secondary | ICD-10-CM | POA: Diagnosis present

## 2021-10-07 DIAGNOSIS — R072 Precordial pain: Secondary | ICD-10-CM | POA: Diagnosis not present

## 2021-10-07 DIAGNOSIS — K5900 Constipation, unspecified: Secondary | ICD-10-CM | POA: Diagnosis not present

## 2021-10-07 DIAGNOSIS — J432 Centrilobular emphysema: Secondary | ICD-10-CM | POA: Diagnosis not present

## 2021-10-07 DIAGNOSIS — J984 Other disorders of lung: Secondary | ICD-10-CM | POA: Diagnosis present

## 2021-10-07 DIAGNOSIS — F17210 Nicotine dependence, cigarettes, uncomplicated: Secondary | ICD-10-CM | POA: Diagnosis present

## 2021-10-07 DIAGNOSIS — I10 Essential (primary) hypertension: Secondary | ICD-10-CM | POA: Diagnosis present

## 2021-10-07 DIAGNOSIS — E46 Unspecified protein-calorie malnutrition: Secondary | ICD-10-CM | POA: Diagnosis not present

## 2021-10-07 DIAGNOSIS — R931 Abnormal findings on diagnostic imaging of heart and coronary circulation: Secondary | ICD-10-CM | POA: Diagnosis not present

## 2021-10-07 DIAGNOSIS — Z7951 Long term (current) use of inhaled steroids: Secondary | ICD-10-CM

## 2021-10-07 LAB — CBC WITH DIFFERENTIAL/PLATELET
Abs Immature Granulocytes: 0.04 10*3/uL (ref 0.00–0.07)
Basophils Absolute: 0 10*3/uL (ref 0.0–0.1)
Basophils Relative: 0 %
Eosinophils Absolute: 0 10*3/uL (ref 0.0–0.5)
Eosinophils Relative: 0 %
HCT: 40.7 % (ref 36.0–46.0)
Hemoglobin: 13.5 g/dL (ref 12.0–15.0)
Immature Granulocytes: 1 %
Lymphocytes Relative: 15 %
Lymphs Abs: 1.2 10*3/uL (ref 0.7–4.0)
MCH: 30.8 pg (ref 26.0–34.0)
MCHC: 33.2 g/dL (ref 30.0–36.0)
MCV: 92.9 fL (ref 80.0–100.0)
Monocytes Absolute: 0.3 10*3/uL (ref 0.1–1.0)
Monocytes Relative: 3 %
Neutro Abs: 6.5 10*3/uL (ref 1.7–7.7)
Neutrophils Relative %: 81 %
Platelets: 218 10*3/uL (ref 150–400)
RBC: 4.38 MIL/uL (ref 3.87–5.11)
RDW: 14.6 % (ref 11.5–15.5)
WBC: 8 10*3/uL (ref 4.0–10.5)
nRBC: 0 % (ref 0.0–0.2)

## 2021-10-07 LAB — COMPREHENSIVE METABOLIC PANEL
ALT: 12 U/L (ref 0–44)
AST: 22 U/L (ref 15–41)
Albumin: 4.5 g/dL (ref 3.5–5.0)
Alkaline Phosphatase: 111 U/L (ref 38–126)
Anion gap: 16 — ABNORMAL HIGH (ref 5–15)
BUN: 12 mg/dL (ref 8–23)
CO2: 21 mmol/L — ABNORMAL LOW (ref 22–32)
Calcium: 9.7 mg/dL (ref 8.9–10.3)
Chloride: 103 mmol/L (ref 98–111)
Creatinine, Ser: 1.03 mg/dL — ABNORMAL HIGH (ref 0.44–1.00)
GFR, Estimated: 59 mL/min — ABNORMAL LOW (ref >60)
Glucose, Bld: 155 mg/dL — ABNORMAL HIGH (ref 70–99)
Potassium: 3.2 mmol/L — ABNORMAL LOW (ref 3.5–5.1)
Sodium: 140 mmol/L (ref 135–145)
Total Bilirubin: 0.5 mg/dL (ref 0.3–1.2)
Total Protein: 7.6 g/dL (ref 6.5–8.1)

## 2021-10-07 LAB — HEPARIN LEVEL (UNFRACTIONATED): Heparin Unfractionated: 0.35 IU/mL (ref 0.30–0.70)

## 2021-10-07 LAB — RESP PANEL BY RT-PCR (FLU A&B, COVID) ARPGX2
Influenza A by PCR: NEGATIVE
Influenza B by PCR: NEGATIVE
SARS Coronavirus 2 by RT PCR: NEGATIVE

## 2021-10-07 LAB — TSH: TSH: 0.762 u[IU]/mL (ref 0.350–4.500)

## 2021-10-07 LAB — CBG MONITORING, ED: Glucose-Capillary: 155 mg/dL — ABNORMAL HIGH (ref 70–99)

## 2021-10-07 LAB — PROTIME-INR
INR: 1 (ref 0.8–1.2)
Prothrombin Time: 13.2 seconds (ref 11.4–15.2)

## 2021-10-07 LAB — TROPONIN I (HIGH SENSITIVITY)
Troponin I (High Sensitivity): 7 ng/L (ref <18)
Troponin I (High Sensitivity): 7 ng/L (ref <18)

## 2021-10-07 LAB — D-DIMER, QUANTITATIVE: D-Dimer, Quant: 0.57 ug/mL-FEU — ABNORMAL HIGH (ref 0.00–0.50)

## 2021-10-07 LAB — MAGNESIUM: Magnesium: 1.9 mg/dL (ref 1.7–2.4)

## 2021-10-07 MED ORDER — HEPARIN (PORCINE) 25000 UT/250ML-% IV SOLN
750.0000 [IU]/h | INTRAVENOUS | Status: DC
  Administered 2021-10-07: 650 [IU]/h via INTRAVENOUS
  Filled 2021-10-07: qty 250

## 2021-10-07 MED ORDER — POTASSIUM CHLORIDE CRYS ER 20 MEQ PO TBCR
40.0000 meq | EXTENDED_RELEASE_TABLET | Freq: Once | ORAL | Status: AC
  Administered 2021-10-07: 40 meq via ORAL
  Filled 2021-10-07: qty 2

## 2021-10-07 MED ORDER — HEPARIN BOLUS VIA INFUSION
60.0000 [IU]/kg | Freq: Once | INTRAVENOUS | Status: AC
Start: 2021-10-07 — End: 2021-10-07
  Administered 2021-10-07: 3300 [IU] via INTRAVENOUS
  Filled 2021-10-07: qty 3300

## 2021-10-07 MED ORDER — SODIUM CHLORIDE 0.9 % IV BOLUS
500.0000 mL | Freq: Once | INTRAVENOUS | Status: AC
  Administered 2021-10-07: 500 mL via INTRAVENOUS

## 2021-10-07 MED ORDER — ATORVASTATIN CALCIUM 80 MG PO TABS
80.0000 mg | ORAL_TABLET | Freq: Every day | ORAL | Status: DC
  Administered 2021-10-08: 80 mg via ORAL
  Filled 2021-10-07: qty 1

## 2021-10-07 MED ORDER — ASPIRIN 81 MG PO CHEW
324.0000 mg | CHEWABLE_TABLET | Freq: Once | ORAL | Status: AC
  Administered 2021-10-07: 324 mg via ORAL
  Filled 2021-10-07: qty 4

## 2021-10-07 MED ORDER — NITROGLYCERIN 0.4 MG SL SUBL
0.4000 mg | SUBLINGUAL_TABLET | SUBLINGUAL | Status: AC | PRN
  Administered 2021-10-07 (×3): 0.4 mg via SUBLINGUAL
  Filled 2021-10-07: qty 1

## 2021-10-07 MED ORDER — METOPROLOL SUCCINATE ER 25 MG PO TB24
50.0000 mg | ORAL_TABLET | Freq: Once | ORAL | Status: AC
  Administered 2021-10-08: 50 mg via ORAL
  Filled 2021-10-07: qty 2

## 2021-10-07 MED ORDER — LEVETIRACETAM 500 MG PO TABS
500.0000 mg | ORAL_TABLET | Freq: Two times a day (BID) | ORAL | Status: DC
  Filled 2021-10-07: qty 1

## 2021-10-07 MED ORDER — NITROGLYCERIN IN D5W 200-5 MCG/ML-% IV SOLN
0.0000 ug/min | INTRAVENOUS | Status: DC
  Administered 2021-10-07: 5 ug/min via INTRAVENOUS
  Filled 2021-10-07: qty 250

## 2021-10-07 MED ORDER — ENSURE ENLIVE PO LIQD
237.0000 mL | Freq: Three times a day (TID) | ORAL | Status: DC
  Administered 2021-10-09: 237 mL via ORAL
  Filled 2021-10-07: qty 237

## 2021-10-07 MED ORDER — LEVETIRACETAM 500 MG PO TABS
500.0000 mg | ORAL_TABLET | Freq: Two times a day (BID) | ORAL | Status: DC
  Administered 2021-10-07 – 2021-10-09 (×4): 500 mg via ORAL
  Filled 2021-10-07 (×3): qty 1

## 2021-10-07 MED ORDER — POLYETHYLENE GLYCOL 3350 17 G PO PACK: 17.0000 g | PACK | Freq: Every day | ORAL | Status: DC | PRN

## 2021-10-07 MED ORDER — ASPIRIN EC 81 MG PO TBEC
81.0000 mg | DELAYED_RELEASE_TABLET | Freq: Every day | ORAL | Status: DC
  Administered 2021-10-08 – 2021-10-09 (×2): 81 mg via ORAL
  Filled 2021-10-07 (×2): qty 1

## 2021-10-07 MED ORDER — ONDANSETRON HCL 4 MG/2ML IJ SOLN
4.0000 mg | Freq: Four times a day (QID) | INTRAMUSCULAR | Status: DC | PRN
  Administered 2021-10-08 – 2021-10-09 (×3): 4 mg via INTRAVENOUS
  Filled 2021-10-07 (×3): qty 2

## 2021-10-07 MED ORDER — UMECLIDINIUM BROMIDE 62.5 MCG/ACT IN AEPB
1.0000 | INHALATION_SPRAY | Freq: Every day | RESPIRATORY_TRACT | Status: DC
  Administered 2021-10-08: 1 via RESPIRATORY_TRACT
  Filled 2021-10-07: qty 7

## 2021-10-07 MED ORDER — AMLODIPINE BESYLATE 5 MG PO TABS
5.0000 mg | ORAL_TABLET | Freq: Every day | ORAL | Status: DC
  Administered 2021-10-08 – 2021-10-09 (×2): 5 mg via ORAL
  Filled 2021-10-07 (×2): qty 1

## 2021-10-07 MED ORDER — FLUTICASONE FUROATE-VILANTEROL 100-25 MCG/ACT IN AEPB
1.0000 | INHALATION_SPRAY | Freq: Every day | RESPIRATORY_TRACT | Status: DC
  Administered 2021-10-08: 1 via RESPIRATORY_TRACT
  Filled 2021-10-07: qty 28

## 2021-10-07 MED ORDER — ACETAMINOPHEN 325 MG PO TABS
650.0000 mg | ORAL_TABLET | ORAL | Status: DC | PRN
  Administered 2021-10-07 – 2021-10-08 (×2): 650 mg via ORAL
  Filled 2021-10-07 (×2): qty 2

## 2021-10-07 MED ORDER — PANTOPRAZOLE SODIUM 40 MG PO TBEC
40.0000 mg | DELAYED_RELEASE_TABLET | Freq: Every day | ORAL | Status: DC
  Administered 2021-10-07 – 2021-10-09 (×3): 40 mg via ORAL
  Filled 2021-10-07 (×3): qty 1

## 2021-10-07 MED ORDER — IOHEXOL 350 MG/ML SOLN
59.0000 mL | Freq: Once | INTRAVENOUS | Status: AC | PRN
  Administered 2021-10-07: 59 mL via INTRAVENOUS

## 2021-10-07 NOTE — Hospital Course (Addendum)
Chest Pain Presented with cute onset substernal squeezing chest pain that has had been waxing and waning since 10/05/2021.CXR showed hyperinflated lung fields but no evidence of lobar opacities, EKG showed ST segment depression diffusely. Troponins were 7>7>8. Cardiology had the patient started on IV NTG and heparin. 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 Cardiology discontinued the IV heparin and IV NTG. CT coronary angiogram was obtained which showed coronary atherosclerotic disease in the LAD and left circumflex. Echocardiogram showed 60 to 65% EF without regional wall abnormalities and myxomatous MV and TV. At end of hospitalization cardiology recommended continuing with ASA, statin, and beta blocker. Cardiology let us know she had a reassuring CTA FFR and had no further cardiac testing needed.    Myxomatous mitral valve and tricuspid valve On echocardiogram 2/7.  We will follow-up with cardiology outpatient.  Nausea   protein calorie malnutriton   weight loss 4 kg weight loss since August 2022.  Due for colonoscopy screening.  Has nausea likely secondary to cannabinoid hyperemesis syndrome.  Capsaicin gel was added for this as well as protonix.  Facial Rash   Abnormal Nevus   Redness over bridge of nose and onto both cheeks seborrheic dermatitis versus Lupoid butterfly rash. ANA was negative. Hyperkeratotic, hyperpigmented, irregularly shaped nevus over middle upper back.

## 2021-10-07 NOTE — H&P (Addendum)
[facility] Admission History and Physical Service Pager: [phone]  Patient name: [name] Medical record number: [id number] Date of birth: 04-26-1951 Age: 71 y.o. Gender: female  Primary Care Provider: Shary Key, DO Consultants: Cardiology  Code Status: FULL Preferred Emergency Contact: Shaaron Golliday, daughter, [phone]     Chief Complaint: Chest Pain, Vomiting   Assessment and Plan: [name] is a 71 y.o. female presenting with chest pain. 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   Chest Pain   Inferior Ischemia  Pt presented to the ED BIB EMS with acute onset of chest pain. Lab work significant in the ED: D-dimer 0.57 (age adjusted cut off of 0.68), K 3.2, Bicarb 21, gluc 155, Cr 1.03, GFR 59, AG 16, CBC unremarkable, trop 7>trending, RPP negative, PTT-INR nml, CXR showed hyperinflated lung fields but no evidence of lobar opacities, EKG showed ST segment depression in inferior leads.  Appears to be chest pain secondary to ACS. Cannot disregard AAA or rupture (although highly unlikely given that she is hemodynamically stable), PE (tachycardic but with negative d-dimer when age adjusted and no O2 requirement), infectious cause, such as pericarditis (had onset of chills/cold-like symptoms prior to chest pain but has been afebrile without leukocytosis while in hospital), possible attribution to acute CHF given history provided (no obvious volume overload, obtaining echocardiogram), or could also be referred from abdominal pain and vomiting (discussed below).  With complex history of atherosclerotic disease and clinical appearance as well as EKG findings, cardiology was consulted by the ED provider. Given inferior ischemia,  cardiology had the patient started on IV NTG and heparin. Recommended ordering lipid panel and d-dimer (as above) as well as coronary CTA and updated echocardiogram (Last Echo 10/2019 EF 60-65% with G1DD and trivial mitral valve regurgitation). CTA PE was then subsequently ordered given d-dimer elevation, although low risk for PE given that it is below age adjusted cut off. HEART score 6. En route to the hospital, she received dose of Solu-medrol and Albuterol. In the ED, she was given ASA, NTG tablet (that provided some benefit of pain), NS bolus 500 mL/hr, potassium repletion, Metoprolol 50 mg x1 dose, and was started on NTG and heparin gtt. Heparin drip to be discontinued if CTA PE has negative results per cardiology recommendations. Admitted to [place] with cardiology following closely.  -Admit to [place], Dr. McDiarmid attending  -Cardiology following appreciate recommendations  -VS per floor protocol  -NTG and Heparin gtt -CTA PE and coronary CTA pending  -Echo pending  -Lipid panel, TSH, A1C pending   -AM CBC, BMP, Mag -K>4, Mg>2 -s/p Metoprolol 50 mg oral  -CRM -Admit to Progressive (NTG gtt)  Weight Loss   Abdominal Pain/Nausea/Vomiting  Protein Calorie Malnutrition  Today, patient reports of abdominal pain and vomiting that has been present for approximately one week. She has a history of cyclical vomiting disorder that may be related to [facility] use per chart review. Additionally, she has had weight loss over the past several months and is currently very frail on examination. Seen previously in clinic at [facility] for weight loss and had malnutrition at last admission to [facility] (04/2021). On last admission, the patient was 79 kg--->55kg today. She was referred to GI for updated colonoscopy in 8/22, has not yet been done. Need to assess for malignancy given weight loss  in conjunction with abdominal symptoms and no known previous colonoscopy. Reassuringly, abdominal exam today is benign.  -Consider consult  to nutrition  -Consider GI consult -UDS ordered  -Zofran 4 mg q6PRN (Qtc 461) -Monitor weight -FLD and advance as tolerated  -Ensure TID  -PT/OT eval and treat  PAD s/p common femoral endarterectomy with bypass  aortic atherosclerosis   HLD  Updated lipid panel pending, last panel 04/10/21 Total 214, LDL 153, HDL 45, Triglycerides 80. Home medication: Atorvastatin 80 mg 1xdaily  -await updated panel  -restart home Atorvastatin dose  -FLD advancing to Heart healthy diet   H/o CVA Primary prevention of ASA 81 mg daily. CVA occurred March 2021 with punctate right medullary and right cerebellar infarcts. She received DAPT for 21 total days and was instructed to continue with solely ASA daily after this. No presence of residual weakness on exam.   -Continue Atorvastatin 80 mg  -Continue ASA   COPD   Tobacco Use Disorder  CXR showed hyperinflated lung fields today. No known home oxygen requirement. 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 Ultimately, she was started on Keppra 500 mg twice daily and follows with neurology outpatient. No recent known seizure activity.  -Continue with Keppra home dose  -Seizure and delirium precautions  -Avoid seizure inducing medications as able   GERD   H/o H. Pylori  Home medication: Protonix tab 40 mg. History of H. Pylori with breath test performed on 04/19/22 that was negative.  -Continue home Protonix dose    HTN  Blood pressure during admission 140s-150s/70s-80s. Patient has been given a dose of Metoprolol 50 mg in ED (HR 80s-100s). Home dose: Metoprolol (Toprol XR) 50 mg daily.  -Continue home dosage  -VS per protocol   Constipation  Pt has difficulty with regular BM, last one was on Thursday of last week. Likely secondary to malnutrition. Will monitor in hospital, and we have ordered MiraLAX PRN for bowel regimen if needed.  - needs colonoscopy   Facial Rash? Presence of redness over bridge of nose and onto both cheeks, appears to possibly be due to mechanical irritation versus seborrheic dermatitis versus Lupoid butterfly rash. Will continue to monitor.  - obtain ANA  Abnormal Nevus   Hyperkeratotic, hyperpigmented, irregularly shaped nevus over middle upper back, unsure of length of time it has been present but reports area itches. It is difficult for patient to see. Suspect seborrhoic keratosis vs akinetic keratosis.   -Follow up outpatient for further work up   FEN/GI: FLD advance to Heart Healthy as tolerated   Prophylaxis: Heparin gtt   Disposition: Progressive (NTG gtt)  History of Present Illness:  [name] is a 71 y.o. female presenting with chest pain, vomiting, nausea. 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 Pt reports of a onset of 2 weeks of weakness, cough, chills, and  back pain that she thought was the beginning of a cold. She treated cold symptoms with Mucinex, Alka-Seltzer. About one week ago, the patient began experiencing vomiting, chest pain, chills, and subjective fever. She vomited twice daily for the past week. She has been able to drink but not eat much at all. No evidence of hematemesis. This AM, she was very SOB and felt dizzy, could not stop coughing. This  caused her to come into the ED via EMS.   She has had some trouble breathing since August 2023 and sleeps on 4 pillows, experiences orthopnea. She is only able to ambulate to the mailbox before getting short of breath.   During this time, she missed a cardiology appt with Dr. Debara Pickett due to her sickness.   Currently, she endorses abdominal pain, bilateral arm pain, back pain.  Last bowel movement was last week. Pt denies dysuria.   Review Of Systems: Per HPI with the following additions:   Review of Systems  Constitutional:  Positive for chills and fever (undocumented).  HENT:  Positive for congestion and sore throat (resolved).   Eyes:        Blurry vision  Respiratory:  Positive for cough and shortness of breath.   Cardiovascular:  Positive for chest pain. Negative for leg swelling.  Gastrointestinal:  Positive for abdominal pain, constipation and vomiting.  Genitourinary:  Negative for dysuria.  Musculoskeletal:  Positive for back pain.  Neurological:  Positive for dizziness. Negative for syncope.  Psychiatric/Behavioral:  Positive for sleep disturbance.     Patient Active Problem List   Diagnosis Date Noted   Chest pain 10/07/2021   Need for home health care 04/20/2021   Seizure ([place]) 04/09/2021   Depression, major, single episode, severe ([place]) 09/14/2020   Healthcare maintenance 11/27/2019   Vaginal Pap smear with LGSIL 11/23/2019   CVA (cerebral vascular accident) ([place]) 11/04/2019   Prediabetes 10/27/2017   PAD (peripheral artery disease) ([place]). 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   Occlusion of common femoral artery (HCC)    Weight loss    Lumbar disc herniation    Hypertensive retinopathy of right eye, grade 1 05/03/2014   Hypertensive retinopathy of left eye, grade 2 05/03/2014   Abdominal pain 08/15/2013   Dyspnea 08/10/2013    Protein-calorie malnutrition, severe ([place]) 18/84/1660   Cyclical vomiting 63/09/6008   Tobacco abuse    GASTROESOPHAGEAL REFLUX DISEASE 12/29/2006   PSORIASIS 12/29/2006    Past Medical History: Past Medical History:  Diagnosis Date   Abdominal pain, epigastric 02/23/2013   Abdominal pain, unspecified site 08/15/2013   Abnormal neurological exam 04/11/2016   Acute CVA (cerebrovascular accident) ([place]) 11/03/2019   Acute non-recurrent frontal sinusitis 05/21/2018   Asthma    Cataracts, bilateral 05/03/2014   Cephalalgia    Chronic bronchitis (HCC)    Chronic lower back pain    COPD (chronic obstructive pulmonary disease) (HCC)    Daily headache    De Quervain's tenosynovitis, left 11/09/2014   GERD (gastroesophageal reflux disease)    Hx of colonic polyps    Maxillary sinusitis    Nausea & vomiting    Orthopnea 09/28/2015   Reports of 5 pillow orthopnea (from 2-3) which began with cold symptoms. Could be due to cold but would like to rule out cardiac etiology. Also noted of 1 year history of dyspnea on exertion which only worsens when she  gets a cold; this could be due to COPD however cardiac etiology should be considered. No JVD or LE edema on exam.  - ECHO ordered - will refer to cardiology if ECHO concerning for H   PSORIASIS 12/29/2006   Qualifier: Diagnosis of  By: Jobe Igo MD, David     Right leg pain 04/02/2016   Smoker     Past Surgical History: Past Surgical History:  Procedure Laterality Date   ABDOMINAL AORTOGRAM W/LOWER EXTREMITY N/A 10/06/2016   Procedure: Abdominal Aortogram w/Lower Extremity;  Surgeon: Waynetta Sandy, MD;  Location: [facility];  Service: Cardiovascular;  Laterality: N/A;   ABDOMINAL AORTOGRAM W/LOWER EXTREMITY N/A 02/10/2018   Procedure: ABDOMINAL AORTOGRAM W/LOWER EXTREMITY;  Surgeon: Waynetta Sandy, MD;  Location: [facility];  Service: Cardiovascular;  Laterality: N/A;   ABDOMINAL HERNIA REPAIR     APPENDECTOMY      COLONOSCOPY W/ POLYPECTOMY  02/26/2005   FEMORAL-POPLITEAL BYPASS GRAFT Left 04/20/2018   Procedure: Left FEMORAL-POPLITEAL ARTERY Bypass Graft using Gore Propaten Vascular Graft;  Surgeon: Waynetta Sandy, MD;  Location: [facility];  Service: Vascular;  Laterality: Left;   HERNIA REPAIR     LOWER EXTREMITY ANGIOGRAPHY Bilateral 04/19/2018   Procedure: LOWER EXTREMITY ANGIOGRAPHY;  Surgeon: Waynetta Sandy, MD;  Location: [place];  Service: Cardiovascular;  Laterality: Bilateral;   PERIPHERAL VASCULAR ATHERECTOMY Right 10/06/2016   Procedure: Peripheral Vascular Atherectomy;  Surgeon: Waynetta Sandy, MD;  Location: [place] [facility];  Service: Cardiovascular;  Laterality: Right;   PERIPHERAL VASCULAR INTERVENTION Right 10/06/2016   Procedure: Peripheral Vascular Intervention;  Surgeon: Waynetta Sandy, MD;  Location: [facility];  Service: Cardiovascular;  Laterality: Right;  SFA   PERIPHERAL VASCULAR INTERVENTION Left 02/10/2018   Procedure: PERIPHERAL VASCULAR INTERVENTION;  Surgeon: Waynetta Sandy, MD;  Location: [place];  Service: Cardiovascular;  Laterality: Left;   TONSILLECTOMY  1960s   UPPER GASTROINTESTINAL ENDOSCOPY  03/22/2010, 02/13/2005   VAGINAL HYSTERECTOMY     partial    Social History: Social History   Tobacco Use   Smoking status: Every Day    Packs/day: 0.50    Years: 47.00    Pack years: 23.50    Types: Cigarettes   Smokeless tobacco: Never   Tobacco comments:    3-4 cigarettes per day  Vaping Use   Vaping Use: Never used  Substance Use Topics   Alcohol use: Not Currently    Alcohol/week: 0.0 standard drinks    Comment: Quit alcohol in 1992; "never really drank much alcohol"   Drug use: Yes    Types: Marijuana    Comment: 02/10/2018 "~ twice//wk"   Please also refer to relevant sections of EMR.  Family History: Family History  Problem Relation Age of Onset   Cancer Other    Coronary  artery disease Other    Cancer Mother    Hypertension Mother    Heart disease Mother    Cancer Father    Hypertension Father    Cancer Sister    Hypertension Sister     Allergies and Medications: No Known Allergies No current facility-administered medications on file prior to encounter.   Current Outpatient Medications on File Prior to Encounter  Medication Sig Dispense Refill   albuterol (VENTOLIN HFA) 108 (90 Base) MCG/ACT inhaler INHALE 2 PUFFS INTO THE LUNGS EVERY 4 HOURS AS NEEDED FOR WHEEZING OR SHORTNESS OF BREATH 18 g 1   amLODipine (NORVASC) 5 MG  tablet TAKE 1 TABLET(5 MG) BY MOUTH DAILY 90 tablet 1   aspirin EC 81 MG tablet Take 1 tablet (81 mg total) by mouth daily. 90 tablet 3   atorvastatin (LIPITOR) 80 MG tablet Take 1 tablet (80 mg total) by mouth daily at 6 PM. 90 tablet 3   Fluticasone-Umeclidin-Vilant (TRELEGY ELLIPTA) 100-62.5-25 MCG/INH AEPB Inhale 1 puff into the lungs daily. 60 each 1   levETIRAcetam (KEPPRA) 500 MG tablet Take 1 tablet (500 mg total) by mouth 2 (two) times daily. 60 tablet 12   naproxen sodium (ALEVE) 220 MG tablet Take 220 mg by mouth as needed (pain).     nystatin (MYCOSTATIN/NYSTOP) powder Apply 1 application topically 3 (three) times daily. 15 g 0   ondansetron (ZOFRAN) 4 MG tablet TAKE 1 TABLET(4 MG) BY MOUTH EVERY 8 HOURS AS NEEDED FOR NAUSEA OR VOMITING 20 tablet 0    Objective: BP (!) 154/91    Pulse 98    Temp 98.9 F (37.2 C) (Oral)    Resp (!) 21    Ht 5\' 6"  (1.676 m)    Wt 55 kg    SpO2 98%    BMI 19.57 kg/m  Exam: General: NAD, frail, elderly women, nontoxic appearing  Eyes: Tracks appropriately  ENTM: Nml nares and ears, no gross abnormalities  Neck: FROM  Cardiovascular: RRR, no m/r/g Respiratory: CTAB, good aeration, no stridor/crackles/rhonchi  Gastrointestinal: nontender, nondistended, soft, normoactive BS  MSK: Able to move all extremities, palpable DP pulses, no edema  Derm: anteriorly hyperkeratotic, hyperpigmented,  irregularly shaped nevus over middle upper back 1 cm in size, facial redness with peeling skin over bridge of nose and into cheeks bilaterally. No evidence of infection.  Neuro: Appropriately responsive without focal neurological deficits Psych: Appropriate behavior and mood   Labs and Imaging: CBC BMET  Recent Labs  Lab 10/07/21 1530  WBC 8.0  HGB 13.5  HCT 40.7  PLT 218   Recent Labs  Lab 10/07/21 1530  NA 140  K 3.2*  CL 103  CO2 21*  BUN 12  CREATININE 1.03*  GLUCOSE 155*  CALCIUM 9.7     EKG: ST segment depression evident in the inferior leads   DG Chest Portable 1 View  Result Date: 10/07/2021 CLINICAL DATA:  Chest pain and dyspnea for the past 3 days.  Smoker. EXAM: PORTABLE CHEST 1 VIEW COMPARISON:  04/09/2021 FINDINGS: Normal sized heart. Mild aortic arch calcifications. Hyperexpanded lungs with stable mildly prominent interstitial markings without Kerley lines or pleural fluid. Mild scoliosis. The bones appear osteopenic. IMPRESSION: No acute abnormality.  Stable changes of COPD. Electronically Signed   By: Claudie Revering M.D.   On: 10/07/2021 16:34    Erskine Emery, MD 10/07/2021, 9:53 PM PGY-1, [place] Intern pager: [phone], text pages welcome  FPTS Upper-Level Resident Addendum   I have independently interviewed and examined the patient. I have discussed the above with the original author and agree with their documentation. My edits for correction/addition/clarification are in within the document. Please see also any attending notes.   Lyndee Hensen, DO PGY-3, [facility] 10/08/2021 1:48 AM  FPTS Service pager: [phone] (text pages welcome through [facility])

## 2021-10-07 NOTE — ED Provider Notes (Signed)
71 yo female here with chest pain, pending cards consult, minor lateral ST depressions on ECG, no STEMI.  Initial trop wnl.  Pending cardiology consult.  Patient on nitro infusion with some improvement of chest pain.  Cardiology team recommending medical admission for CTCA tomorrow, if ddimer negative can discontinue heparin.  630 pm update. Ddimer positive, per discussion with Cecilie Kicks cardiology PA, they would recommend continuing heparin and obtaining a CT PE study at this time.  This has been ordered.  Patient admitted to family medicine practice.   Wyvonnia Dusky, MD 10/07/21 [phone]

## 2021-10-07 NOTE — ED Triage Notes (Signed)
Pt presenting from home via EMS with c/o generalized weakness, n/v, and SHOB. Has had a productive cough with yellow sputum. Hx of COPD and home inhaler use has given pt no relief. EMS administered 5 of albuterol, duoneb, solumedrol 125mg , and 4mg  of zofran. Denies pain. 20G L FA.

## 2021-10-07 NOTE — Consult Note (Addendum)
Cardiology Consultation:   Patient ID: [name] MRN: [id number]; DOB: June 06, 1951  Admit date: 10/07/2021 Date of Consult: 10/07/2021  PCP:  Shary Key, DO   [facility] Providers Cardiologist:  Sinclair Grooms, MD        Patient Profile:   [name] is a 71 y.o. female with a hx of pulmonary hypertension, + tobacco, COPD, GERD, PAD, aortic atherosclerosis, HLD, with normal echo 2019 and no further need for cardiology follow up, hx CVA insurance denied loop recorder and pt did not complete event monitor, seizures on keppra who is being seen 10/07/2021 for the evaluation of weakness, SOB, N/V and COPD  at the request of Dr. Regenia Skeeter.  History of Present Illness:   [name] with hx as above and remotely saw Dr. Tamala Julian for SOB and found to have normal echo in 2019 and neg nuc study.   She called EMS for weakness for 2-3 days, fever, and chills, now productive cough.  Also N&V,  she believes but not sure but the chest pain began after the vomiting.  Given albuterol, and solumedrol by EMS and is feeling better, she does have chest pain.  She was given SL NTG with some relief and now on NTG drip.  Her pain at worst was 9/10 and now 5 /10   her troponin is neg at 7.      Last echo EF 60-65% in 2021, G1DD.  Trival MV Regurg.    Hx of Rt vertebral artery occlusion, hx of Lt common femoral endarterectomy with bypass to her below knee popliteal artery.  And hx of possible need for bypass femoral to above knee popliteal on Rt, but no vein for use there.  Last PV studies were 2019.  At that time ABIs right 0.48 and left 0.98.  she still has numb toes. No claudication     EKG:  The EKG was personally reviewed and demonstrates:  ST with ST depression in inf leads, new from 04/2021.   Telemetry:  Telemetry was personally reviewed and demonstrates:  SR  PCXR FINDINGS: Normal sized heart. Mild aortic arch calcifications. Hyperexpanded lungs with stable mildly prominent interstitial  markings without Kerley lines or pleural fluid. Mild scoliosis. The bones appear osteopenic.   IMPRESSION: No acute abnormality.  Stable changes of COPD.    Hs troponin 7  CMP Na 140, k+ 3.2 BUN 12, Cr 1.03 LFTs normal.  WBC 8.0 Hgb 13.5, plts 218  Resp panel pending.   PCXR pending.    VS BP 151/78 P 105 R 25-20 afebrile here  but by EMS 99.1    Past Medical History:  Diagnosis Date   Abdominal pain, epigastric 02/23/2013   Abdominal pain, unspecified site 08/15/2013   Abnormal neurological exam 04/11/2016   Acute CVA (cerebrovascular accident) ([place]) 11/03/2019   Acute non-recurrent frontal sinusitis 05/21/2018   Asthma    Cataracts, bilateral 05/03/2014   Cephalalgia    Chronic bronchitis (HCC)    Chronic lower back pain    COPD (chronic obstructive pulmonary disease) (HCC)    Daily headache    De Quervain's tenosynovitis, left 11/09/2014   GERD (gastroesophageal reflux disease)    Hx of colonic polyps    Maxillary sinusitis    Nausea & vomiting    Orthopnea 09/28/2015   Reports of 5 pillow orthopnea (from 2-3) which began with cold symptoms. Could be due to cold but would like to rule out cardiac etiology. Also noted of 1 year history of dyspnea  on exertion which only worsens when she gets a cold; this could be due to COPD however cardiac etiology should be considered. No JVD or LE edema on exam.  - ECHO ordered - will refer to cardiology if ECHO concerning for H   PSORIASIS 12/29/2006   Qualifier: Diagnosis of  By: Jobe Igo MD, David     Right leg pain 04/02/2016   Smoker     Past Surgical History:  Procedure Laterality Date   ABDOMINAL AORTOGRAM W/LOWER EXTREMITY N/A 10/06/2016   Procedure: Abdominal Aortogram w/Lower Extremity;  Surgeon: Waynetta Sandy, MD;  Location: [facility];  Service: Cardiovascular;  Laterality: N/A;   ABDOMINAL AORTOGRAM W/LOWER EXTREMITY N/A 02/10/2018   Procedure: ABDOMINAL AORTOGRAM W/LOWER EXTREMITY;  Surgeon: Waynetta Sandy, MD;  Location: [facility];  Service: Cardiovascular;  Laterality: N/A;   ABDOMINAL HERNIA REPAIR     APPENDECTOMY     COLONOSCOPY W/ POLYPECTOMY  02/26/2005   FEMORAL-POPLITEAL BYPASS GRAFT Left 04/20/2018   Procedure: Left FEMORAL-POPLITEAL ARTERY Bypass Graft using Gore Propaten Vascular Graft;  Surgeon: Waynetta Sandy, MD;  Location: [facility];  Service: Vascular;  Laterality: Left;   HERNIA REPAIR     LOWER EXTREMITY ANGIOGRAPHY Bilateral 04/19/2018   Procedure: LOWER EXTREMITY ANGIOGRAPHY;  Surgeon: Waynetta Sandy, MD;  Location: [facility];  Service: Cardiovascular;  Laterality: Bilateral;   PERIPHERAL VASCULAR ATHERECTOMY Right 10/06/2016   Procedure: Peripheral Vascular Atherectomy;  Surgeon: Waynetta Sandy, MD;  Location: [facility];  Service: Cardiovascular;  Laterality: Right;   PERIPHERAL VASCULAR INTERVENTION Right 10/06/2016   Procedure: Peripheral Vascular Intervention;  Surgeon: Waynetta Sandy, MD;  Location: [facility];  Service: Cardiovascular;  Laterality: Right;  SFA   PERIPHERAL VASCULAR INTERVENTION Left 02/10/2018   Procedure: PERIPHERAL VASCULAR INTERVENTION;  Surgeon: Waynetta Sandy, MD;  Location: [facility];  Service: Cardiovascular;  Laterality: Left;   TONSILLECTOMY  1960s   UPPER GASTROINTESTINAL ENDOSCOPY  03/22/2010, 02/13/2005   VAGINAL HYSTERECTOMY     partial     Home Medications:  Prior to Admission medications   Medication Sig Start Date End Date Taking? Authorizing Provider  amLODipine (NORVASC) 5 MG tablet TAKE 1 TABLET(5 MG) BY MOUTH DAILY 10/07/21   Arby Barrette, Victoria J, DO  albuterol (VENTOLIN HFA) 108 (90 Base) MCG/ACT inhaler INHALE 2 PUFFS INTO THE LUNGS EVERY 4 HOURS AS NEEDED FOR WHEEZING OR SHORTNESS OF BREATH 05/07/21   Alric Ran, MD  aspirin EC 81 MG tablet Take 1 tablet (81 mg total) by mouth daily. 11/08/19   Kathrene Alu, MD  atorvastatin  (LIPITOR) 80 MG tablet Take 1 tablet (80 mg total) by mouth daily at 6 PM. 04/10/21   Sharion Settler, DO  Fluticasone-Umeclidin-Vilant (TRELEGY ELLIPTA) 100-62.5-25 MCG/INH AEPB Inhale 1 puff into the lungs daily. 04/19/21   Ezequiel Essex, MD  levETIRAcetam (KEPPRA) 500 MG tablet Take 1 tablet (500 mg total) by mouth 2 (two) times daily. 05/07/21 06/06/21  Alric Ran, MD  nystatin (MYCOSTATIN/NYSTOP) powder Apply 1 application topically 3 (three) times daily. 04/19/21   Ezequiel Essex, MD  ondansetron (ZOFRAN) 4 MG tablet TAKE 1 TABLET(4 MG) BY MOUTH EVERY 8 HOURS AS NEEDED FOR NAUSEA OR VOMITING 09/25/21   Shary Key, DO    Inpatient Medications: Scheduled Meds:    Continuous Infusions:  heparin 650 Units/hr (10/07/21 1602)   nitroGLYCERIN      PRN Meds:   Allergies:   No  Known Allergies  Social History:   Social History   Socioeconomic History   Marital status: Single    Spouse name: Not on file   Number of children: 2   Years of education: 12   Highest education level: High school graduate  Occupational History   Not on file  Tobacco Use   Smoking status: Every Day    Packs/day: 0.50    Years: 47.00    Pack years: 23.50    Types: Cigarettes   Smokeless tobacco: Never   Tobacco comments:    3-4 cigarettes per day  Vaping Use   Vaping Use: Never used  Substance and Sexual Activity   Alcohol use: Not Currently    Alcohol/week: 0.0 standard drinks    Comment: Quit alcohol in 1992; "never really drank much alcohol"   Drug use: Yes    Types: Marijuana    Comment: 02/10/2018 "~ twice//wk"   Sexual activity: Not Currently  Other Topics Concern   Not on file  Social History Narrative   Patient lives alone in [place].    Patient has two adult children nearby.    Patient has never been married.    Patient enjoys riding motorcycles and she is part of a "biker club."   Patient enjoys shopping and going to sporting events.   Social Determinants of  Health   Financial Resource Strain: Not on file  Food Insecurity: Not on file  Transportation Needs: Not on file  Physical Activity: Not on file  Stress: Not on file  Social Connections: Not on file  Intimate Partner Violence: Not on file    Family History:    Family History  Problem Relation Age of Onset   Cancer Other    Coronary artery disease Other    Cancer Mother    Hypertension Mother    Heart disease Mother    Cancer Father    Hypertension Father    Cancer Sister    Hypertension Sister      ROS:  Please see the history of present illness.  General:no colds or fevers, no weight changes Skin:no rashes or ulcers HEENT:no blurred vision, no congestion CV:see HPI PUL:see HPI GI:no diarrhea constipation or melena, no indigestion GU:no hematuria, no dysuria MS:no joint pain, no claudication but toe numbness chronic, hx of PAD Neuro:no syncope, no lightheadedness, hx CVA and seizures  Endo:no diabetes, no thyroid disease  All other ROS reviewed and negative.     Physical Exam/Data:   Vitals:   10/07/21 1530 10/07/21 1545 10/07/21 1600 10/07/21 1615  BP: (!) 154/91 (!) 151/78 140/80 130/80  Pulse: 99 (!) 105 97 96  Resp: 19 20 16 18   Temp:      TempSrc:      SpO2: 97% 96% 99% 96%  Weight:      Height:       No intake or output data in the 24 hours ending 10/07/21 1626 Last 3 Weights 10/07/2021 05/07/2021 04/09/2021  Weight (lbs) 121 lb 4.1 oz 120 lb 130 lb  Weight (kg) 55 kg 54.432 kg 58.968 kg     Body mass index is 19.57 kg/m.  General:  Well nourished, well developed, in no acute distress though still with chest pain HEENT: normal Neck: no JVD Vascular: No carotid bruits; Distal pulses ? palpate bilaterally Cardiac:  normal S1, S2; RRR; no murmur  gallup rub or click tenderness to palpation to lower sternum  Lungs:  scattered rhonchi  to auscultation bilaterally, no wheezing or rales  Abd: soft, nontender, no hepatomegaly  Ext: no edema Musculoskeletal:   No deformities, BUE and BLE strength normal and equal Skin: warm and dry  Neuro:  alert and oriented X 3 MAE follows commands no focal abnormalities noted Psych:  Normal affect    Relevant CV Studies:   Echo 11/04/19 IMPRESSIONS     1. Left ventricular ejection fraction, by estimation, is 60 to 65%. The  left ventricle has normal function. The left ventricle has no regional  wall motion abnormalities. Left ventricular diastolic parameters are  consistent with Grade I diastolic  dysfunction (impaired relaxation).   2. Right ventricular systolic function is normal. The right ventricular  size is normal. There is normal pulmonary artery systolic pressure. The  estimated right ventricular systolic pressure is 86.7 mmHg.   3. The mitral valve is normal in structure and function. Trivial mitral  valve regurgitation.   4. The aortic valve is tricuspid. Aortic valve regurgitation is not  visualized. No aortic stenosis is present.   5. Mobile atrial septum, does not fulfill criteria for atrial septal  aneurysm.   6. The inferior vena cava is normal in size with greater than 50%  respiratory variability, suggesting right atrial pressure of 3 mmHg.   FINDINGS   Left Ventricle: Left ventricular ejection fraction, by estimation, is 60  to 65%. The left ventricle has normal function. The left ventricle has no  regional wall motion abnormalities. The left ventricular internal cavity  size was normal in size. There is   no left ventricular hypertrophy. Left ventricular diastolic parameters  are consistent with Grade I diastolic dysfunction (impaired relaxation).   Right Ventricle: The right ventricular size is normal. No increase in  right ventricular wall thickness. Right ventricular systolic function is  normal. There is normal pulmonary artery systolic pressure. The tricuspid  regurgitant velocity is 2.39 m/s, and   with an assumed right atrial pressure of 3 mmHg, the estimated right   ventricular systolic pressure is 67.2 mmHg.   Left Atrium: Left atrial size was normal in size.   Right Atrium: Right atrial size was normal in size.   Pericardium: There is no evidence of pericardial effusion.   Mitral Valve: The mitral valve is normal in structure and function.  Trivial mitral valve regurgitation.   Tricuspid Valve: The tricuspid valve is normal in structure. Tricuspid  valve regurgitation is mild.   Aortic Valve: The aortic valve is tricuspid. Aortic valve regurgitation is  not visualized. No aortic stenosis is present.   Pulmonic Valve: The pulmonic valve was normal in structure. Pulmonic valve  regurgitation is not visualized.   Aorta: The aortic root is normal in size and structure.   Venous: The inferior vena cava is normal in size with greater than 50%  respiratory variability, suggesting right atrial pressure of 3 mmHg.   Nuc study 2019  Nuclear stress EF: 66%. The left ventricular ejection fraction is hyperdynamic (>65%). EKG at baseline showed normal sinus rhythm with an incomplete right bundle branch block. There was no ST segment deviation noted during stress. The study is normal. There is no evidence of ischemia. No evidence of previous myocardial infarction. This is a low risk study.   Laboratory Data:  High Sensitivity Troponin:  No results for input(s): TROPONINIHS in the last 720 hours.   ChemistryNo results for input(s): NA, K, CL, CO2, GLUCOSE, BUN, CREATININE, CALCIUM, MG, GFRNONAA, GFRAA, ANIONGAP in the last 168 hours.  No results for input(s): PROT, ALBUMIN, AST, ALT, ALKPHOS,  BILITOT in the last 168 hours. Lipids No results for input(s): CHOL, TRIG, HDL, LABVLDL, LDLCALC, CHOLHDL in the last 168 hours.  Hematology Recent Labs  Lab 10/07/21 1530  WBC 8.0  RBC 4.38  HGB 13.5  HCT 40.7  MCV 92.9  MCH 30.8  MCHC 33.2  RDW 14.6  PLT 218   Thyroid No results for input(s): TSH, FREET4 in the last 168 hours.  BNPNo results for  input(s): BNP, PROBNP in the last 168 hours.  DDimer No results for input(s): DDIMER in the last 168 hours.   Radiology/Studies:  No results found.   Assessment and Plan:   Weakness/nausea/vomiting/SOB/chest pain/ with inf ischemia on EKG.    Her breathing improved with nebulizer and solumedrol.    placed on IV heparin and IV NTG. She has had sl NTG with some relief. She has mild discomfort to palpation on chest. Hx of GERD vomiting may have increased GERD.  Follow troponins, continue heparin and NTG , cardiology will consult with other issues hospitalist would be best to admit.  Check echo check lipids on lipitor at home continue. Check lipids  if ddimer neg can stop IV heparin.  Order cardiac CTA for tomorrow. .   No prior cardiac hx with neg nuc in 2019 but does have PAD.  Hypokalemia replace.  PAD with x of Lt common femoral endarterectomy with bypass to her below knee popliteal artery now dopplers since 2019 COPD inhalers at home. And exacerbation with improvement with nebs and solumedrol. Low grade fever at home COVID and flu panels pending.   Hx of CVA and seizures, no work up for [employer], insurance did not pay for loop recorder and pt would not wear monitor.  Tobacco hx half a pk per week and only smokes half a cigarette, she would like to stop she keeps cutting back.   Risk Assessment/Risk Scores:     HEAR Score (for undifferentiated chest pain):  HEAR Score: 6          For questions or updates, please contact [facility] Please consult [email or web address] for contact info under    Signed, Cecilie Kicks, NP  10/07/2021 4:26 PM   I have examined the patient and reviewed assessment and plan and discussed with patient.  Agree with above as stated.    Chest pain is atypical for ACS.  ECG is concerning with diffuse ST depressions.  Given time course of chest pain, would have expected troponin to be positive if this was ACS.  Given PAD, will plan for CTA coronaries.    Currently, no  chest pain.  Has some mild abdominal pain.  No rebound, no guarding.  Continue to monitor.  Larae Grooms

## 2021-10-07 NOTE — ED Provider Notes (Signed)
[facility] EMERGENCY DEPARTMENT Provider Note   CSN: [id number] Arrival date & time: 10/07/21  1442     History  Chief Complaint  Patient presents with   Shortness of Breath    [name] is a 71 y.o. female.  HPI 71 year old female presents with chest pain.  This all started 2 days ago.  It was coming and going and feeling like a squeezing in her chest but since last night its been worse and constant.  It is in her inferior anterior chest.  She also has shortness of breath, cough with green sputum, nausea and vomiting and generalized weakness.  She denies any fevers at home but states EMS told her she had 1.  They gave her albuterol, Solu-Medrol and Zofran as she states she is feeling somewhat better from the breathing perspective but her chest still hurts.  Is rated as about a 6 out of 10.  She denies any CAD. No leg swelling.  Home Medications Prior to Admission medications   Medication Sig Start Date End Date Taking? Authorizing Provider  amLODipine (NORVASC) 5 MG tablet TAKE 1 TABLET(5 MG) BY MOUTH DAILY 10/07/21   Arby Barrette, Victoria J, DO  albuterol (VENTOLIN HFA) 108 (90 Base) MCG/ACT inhaler INHALE 2 PUFFS INTO THE LUNGS EVERY 4 HOURS AS NEEDED FOR WHEEZING OR SHORTNESS OF BREATH 05/07/21   Alric Ran, MD  aspirin EC 81 MG tablet Take 1 tablet (81 mg total) by mouth daily. 11/08/19   Kathrene Alu, MD  atorvastatin (LIPITOR) 80 MG tablet Take 1 tablet (80 mg total) by mouth daily at 6 PM. 04/10/21   Sharion Settler, DO  Fluticasone-Umeclidin-Vilant (TRELEGY ELLIPTA) 100-62.5-25 MCG/INH AEPB Inhale 1 puff into the lungs daily. 04/19/21   Ezequiel Essex, MD  levETIRAcetam (KEPPRA) 500 MG tablet Take 1 tablet (500 mg total) by mouth 2 (two) times daily. 05/07/21 06/06/21  Alric Ran, MD  nystatin (MYCOSTATIN/NYSTOP) powder Apply 1 application topically 3 (three) times daily. 04/19/21   Ezequiel Essex, MD  ondansetron (ZOFRAN) 4 MG tablet TAKE 1 TABLET(4 MG)  BY MOUTH EVERY 8 HOURS AS NEEDED FOR NAUSEA OR VOMITING 09/25/21   Shary Key, DO      Allergies    Patient has no known allergies.    Review of Systems   Review of Systems  Constitutional:  Negative for fever.  Respiratory:  Positive for cough and shortness of breath.   Cardiovascular:  Positive for chest pain. Negative for leg swelling.  Gastrointestinal:  Positive for nausea and vomiting. Negative for abdominal pain and diarrhea.  Neurological:  Positive for weakness.   Physical Exam Updated Vital Signs BP 138/86    Pulse 100    Temp 98.9 F (37.2 C) (Oral)    Resp 20    Ht 5\' 6"  (1.676 m)    Wt 55 kg    SpO2 97%    BMI 19.57 kg/m  Physical Exam Vitals and nursing note reviewed.  Constitutional:      General: She is not in acute distress.    Appearance: She is well-developed. She is not ill-appearing or diaphoretic.  HENT:     Head: Normocephalic and atraumatic.  Cardiovascular:     Rate and Rhythm: Regular rhythm. Tachycardia present.     Heart sounds: Normal heart sounds.  Pulmonary:     Effort: Pulmonary effort is normal.     Breath sounds: Normal breath sounds. No wheezing.  Chest:     Chest wall: No tenderness.  Abdominal:     Palpations: Abdomen is soft.     Tenderness: There is no abdominal tenderness.  Musculoskeletal:     Right lower leg: No edema.     Left lower leg: No edema.  Skin:    General: Skin is warm and dry.  Neurological:     Mental Status: She is alert.    ED Results / Procedures / Treatments   Labs (all labs ordered are listed, but only abnormal results are displayed) Labs Reviewed  COMPREHENSIVE METABOLIC PANEL - Abnormal; Notable for the following components:      Result Value   Potassium 3.2 (*)    CO2 21 (*)    Glucose, Bld 155 (*)    Creatinine, Ser 1.03 (*)    GFR, Estimated 59 (*)    Anion gap 16 (*)    All other components within normal limits  CBG MONITORING, ED - Abnormal; Notable for the following components:    Glucose-Capillary 155 (*)    All other components within normal limits  RESP PANEL BY RT-PCR (FLU A&B, COVID) ARPGX2  CBC WITH DIFFERENTIAL/PLATELET  HEPARIN LEVEL (UNFRACTIONATED)  PROTIME-INR  TROPONIN I (HIGH SENSITIVITY)    EKG EKG Interpretation  Date/Time:  Monday October 07 2021 16:17:33 EST Ventricular Rate:  95 PR Interval:  129 QRS Duration: 101 QT Interval:  372 QTC Calculation: 468 R Axis:   76 Text Interpretation: Sinus rhythm RSR' in V1 or V2, right VCD or RVH ST depressions still present, though seem a little improved when compared to earlier in the day Confirmed by Sherwood Gambler [phone]) on 10/07/2021 4:22:32 PM  Radiology DG Chest Portable 1 View  Result Date: 10/07/2021 CLINICAL DATA:  Chest pain and dyspnea for the past 3 days.  Smoker. EXAM: PORTABLE CHEST 1 VIEW COMPARISON:  04/09/2021 FINDINGS: Normal sized heart. Mild aortic arch calcifications. Hyperexpanded lungs with stable mildly prominent interstitial markings without Kerley lines or pleural fluid. Mild scoliosis. The bones appear osteopenic. IMPRESSION: No acute abnormality.  Stable changes of COPD. Electronically Signed   By: Claudie Revering M.D.   On: 10/07/2021 16:34    Procedures .Critical Care Performed by: Sherwood Gambler, MD Authorized by: Sherwood Gambler, MD   Critical care provider statement:    Critical care time (minutes):  30   Critical care time was exclusive of:  Separately billable procedures and treating other patients   Critical care was necessary to treat or prevent imminent or life-threatening deterioration of the following conditions:  Cardiac failure   Critical care was time spent personally by me on the following activities:  Development of treatment plan with patient or surrogate, discussions with consultants, evaluation of patient's response to treatment, examination of patient, ordering and review of laboratory studies, ordering and review of radiographic studies, ordering and  performing treatments and interventions, pulse oximetry, re-evaluation of patient's condition and review of old charts    Medications Ordered in ED Medications  heparin ADULT infusion 100 units/mL (25000 units/211mL) (650 Units/hr Intravenous New Bag/Given 10/07/21 1602)  nitroGLYCERIN 50 mg in dextrose 5 % 250 mL (0.2 mg/mL) infusion (5 mcg/min Intravenous New Bag/Given 10/07/21 1634)  sodium chloride 0.9 % bolus 500 mL (0 mLs Intravenous Stopped 10/07/21 1632)  aspirin chewable tablet 324 mg (324 mg Oral Given 10/07/21 1535)  nitroGLYCERIN (NITROSTAT) SL tablet 0.4 mg (0.4 mg Sublingual Given 10/07/21 1603)  heparin bolus via infusion 3,300 Units (3,300 Units Intravenous Bolus from Bag 10/07/21 1602)    ED Course/ Medical Decision Making/  A&P Clinical Course as of 10/07/21 1652  Mon Oct 07, 2021  1529 I discussed with Dr. Acie Fredrickson. Given new ECG findings he recommends Heparin, nitro. May need cath lab if not better. However, no indication for current emergent activation. Cards will come see.  [SG]    Clinical Course User Index [SG] Sherwood Gambler, MD   HEAR Score: 6                       Medical Decision Making Amount and/or Complexity of Data Reviewed Labs: ordered. Radiology: ordered.  Risk OTC drugs. Prescription drug management.   Patient is feeling better after sublingual nitro.  She was given aspirin as well.  She endorses a cough that is new over the last couple days but really states has been ongoing for a while but slightly worse.  Labs have been reviewed which included a normal troponin.  As per above discussion with cardiology, they will come evaluate and she has been treated for ACS with heparin and nitroglycerin and aspirin  Given she is feeling better, I think she is stable enough to wait for the cardiology consult.  Otherwise, her chest x-ray has been personally reviewed and there is no obvious pneumonia.  Care transferred to Dr. Langston Masker.        Final Clinical Impression(s)  / ED Diagnoses Final diagnoses:  None    Rx / DC Orders ED Discharge Orders     None         Sherwood Gambler, MD 10/07/21 [phone]

## 2021-10-07 NOTE — Progress Notes (Signed)
Family Medicine Teaching Service Daily Progress Note Intern Pager: [phone]  Patient name: [name] Medical record number: [id number] Date of birth: 1951/01/12 Age: 71 y.o. Gender: female  Primary Care Provider: Shary Key, DO Consultants: Cardiology Code Status: Full  Pt Overview and Major Events to Date:  1/6 admitted  Assessment and Plan:  [name] is a 71 y.o. female presenting with chest pain. 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  Chest Pain ? LR Coronary Disease Likely 2/2 to ACS given inferior ischemia on EKG and PMH. Chest pain this morning was not present currently but patient was found at 330 this morning started feeling pain for about 30 minutes. CTA PE was negative for acute PE but showed moderate multivessel coronary artery calcification and mild centrilobular emphysema. Nitroglycerin drip stopped this morning, heparin drip is still on. Vitals overnight showed pulses in the 70s to 80s. Echo pending. Lipid panel showed total cholesterol of 114, LDL was not calculated. TSH showed 0.76 A1c was 6.1. 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 Down 4 kg from last admission in august 2022. UDS showed positive THC. -Consider RD consult -ensure -needs colonoscopy outpatient -zofran 4 mg q6h prn -daily weights -advance diet as tolerate -capsaicin gel  -PT/OT  PAD s/p common femoral endarterectomy w bypass aortic atherosclerosis HLD Lipid panel showed  total cholesterol of 114. -Atorvastatin 80 mg daily  COPD Tobacco Use Disorder CXR with hyperinflated lungs. 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  Objective: Temp:  [98.9 F (37.2 C)] 98.9 F (37.2 C) (02/06 1505) Pulse Rate:  [74-105] 77 (02/07 0900) Resp:  [13-24] 15 (02/07 0900) BP: (105-160)/(57-97) 109/57 (02/07 0900) SpO2:  [95 %-100 %] 99 % (02/07 0900) Weight:  [55 kg] 55 kg (02/06 1506) Physical Exam: General: NAD, lying in bed Cardiovascular: RRR no murmurs rubs or gallops Respiratory: Clear to auscultation bilaterally no wheezes rales or crackles Abdomen: Nontender, nondistended, soft Extremities: No lower extremity edema  Laboratory: Recent Labs  Lab 10/07/21 1530 10/08/21 0340  WBC 8.0 6.3  HGB 13.5 11.1*  HCT 40.7 33.8*  PLT 218 196   Recent Labs  Lab 10/07/21 1530 10/08/21 0340  NA 140 137  K 3.2* 4.1  CL 103 105  CO2 21* 21*  BUN 12 15  CREATININE 1.03* 0.96  CALCIUM 9.7 9.3  PROT 7.6  --   BILITOT 0.5  --   ALKPHOS 111  --   ALT 12  --  AST 22  --   GLUCOSE 155* 140*   Imaging/Diagnostic Tests: CT Angio Chest PE W and/or Wo Contrast  Result Date: 10/07/2021 CLINICAL DATA:  Pulmonary embolism (PE) suspected, positive D-dimer. Dyspnea. EXAM: CT  ANGIOGRAPHY CHEST WITH CONTRAST TECHNIQUE: Multidetector CT imaging of the chest was performed using the standard protocol during bolus administration of intravenous contrast. Multiplanar CT image reconstructions and MIPs were obtained to evaluate the vascular anatomy. RADIATION DOSE REDUCTION: This exam was performed according to the departmental dose-optimization program which includes automated exposure control, adjustment of the mA and/or kV according to patient size and/or use of iterative reconstruction technique. CONTRAST:  46mL OMNIPAQUE IOHEXOL 350 MG/ML SOLN COMPARISON:  None. FINDINGS: Cardiovascular: Adequate opacification of the pulmonary arterial tree. No intraluminal filling defect identified to suggest acute pulmonary embolism. Central pulmonary arteries are of normal caliber. Moderate multi-vessel coronary artery calcification. Cardiac size within normal limits. No pericardial effusion. Mild atherosclerotic calcification within the thoracic aorta. No aortic aneurysm. There is partial anomalous pulmonary venous return with drainage of the left upper lobe to the left brachiocephalic vein. Mediastinum/Nodes: No enlarged mediastinal, hilar, or axillary lymph nodes. Thyroid gland, trachea, and esophagus demonstrate no significant findings. Lungs/Pleura: Mild centrilobular emphysema. No focal pulmonary nodules or infiltrates. No pneumothorax or pleural effusion. Central airways are widely patent. Upper Abdomen: No acute abnormality. Musculoskeletal: Remote superior endplate fractures of T6, T7, and T9. No acute bone abnormality. No lytic or blastic bone lesion. Review of the MIP images confirms the above findings. IMPRESSION: No acute pulmonary embolism. Moderate multi-vessel coronary artery calcification. 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 Mild centrilobular emphysema. Aortic Atherosclerosis (ICD10-I70.0) and Emphysema (ICD10-J43.9). Electronically Signed   By:  Fidela Salisbury M.D.   On: 10/07/2021 22:53   DG Chest Portable 1 View  Result Date: 10/07/2021 CLINICAL DATA:  Chest pain and dyspnea for the past 3 days.  Smoker. EXAM: PORTABLE CHEST 1 VIEW COMPARISON:  04/09/2021 FINDINGS: Normal sized heart. Mild aortic arch calcifications. Hyperexpanded lungs with stable mildly prominent interstitial markings without Kerley lines or pleural fluid. Mild scoliosis. The bones appear osteopenic. IMPRESSION: No acute abnormality.  Stable changes of COPD. Electronically Signed   By: Claudie Revering M.D.   On: 10/07/2021 16:34     Gerrit Heck, MD 10/08/2021, 9:20 AM PGY-1, [place] Intern pager: [phone], text pages welcome

## 2021-10-07 NOTE — ED Notes (Signed)
Pt using bedside toilet. Pt asked for pain medicine and EMT advised RN of request.

## 2021-10-07 NOTE — Progress Notes (Signed)
ANTICOAGULATION CONSULT NOTE - Initial Consult  Pharmacy Consult for Heparin Indication: chest pain/ACS  No Known Allergies  Patient Measurements: Height: 5\' 6"  (167.6 cm) Weight: 55 kg (121 lb 4.1 oz) IBW/kg (Calculated) : 59.3 Heparin Dosing Weight: 55 kg  Vital Signs: Temp: 98.9 F (37.2 C) (02/06 1505) Temp Source: Oral (02/06 1505) BP: 160/81 (02/06 1505) Pulse Rate: 96 (02/06 1505)  Labs: No results for input(s): HGB, HCT, PLT, APTT, LABPROT, INR, HEPARINUNFRC, HEPRLOWMOCWT, CREATININE, CKTOTAL, CKMB, TROPONINIHS in the last 72 hours.  CrCl cannot be calculated (Patient's most recent lab result is older than the maximum 21 days allowed.).   Medical History: Past Medical History:  Diagnosis Date   Abdominal pain, epigastric 02/23/2013   Abdominal pain, unspecified site 08/15/2013   Abnormal neurological exam 04/11/2016   Acute CVA (cerebrovascular accident) ([place]) 11/03/2019   Acute non-recurrent frontal sinusitis 05/21/2018   Asthma    Cataracts, bilateral 05/03/2014   Cephalalgia    Chronic bronchitis ([facility])    Chronic lower back pain    COPD (chronic obstructive pulmonary disease) ([facility])    Daily headache    De Quervain's tenosynovitis, left 11/09/2014   GERD (gastroesophageal reflux disease)    Hx of colonic polyps    Maxillary sinusitis    Nausea & vomiting    Orthopnea 09/28/2015   Reports of 5 pillow orthopnea (from 2-3) which began with cold symptoms. Could be due to cold but would like to rule out cardiac etiology. Also noted of 1 year history of dyspnea on exertion which only worsens when she gets a cold; this could be due to COPD however cardiac etiology should be considered. No JVD or LE edema on exam.  - ECHO ordered - will refer to cardiology if ECHO concerning for H   PSORIASIS 12/29/2006   Qualifier: Diagnosis of  By: Jobe Igo MD, Shanon Brow     Right leg pain 04/02/2016   Smoker     Medications:  (Not in a hospital admission)  Scheduled:  Infusions:  PRN:  nitroGLYCERIN  Assessment: 51 yof presenting with SOB. Heparin per pharmacy consult placed for chest pain/ACS.  Patient is not on anticoagulation prior to arrival.  Hgb 13.5; plt 218  Goal of Therapy:  Heparin level 0.3-0.7 units/ml Monitor platelets by anticoagulation protocol: Yes   Plan:  Give 3300 units bolus x 1 Start heparin infusion at 650 units/hr Check anti-Xa level in 6-8 hours and daily while on heparin Continue to monitor H&H and platelets  Lorelei Pont, PharmD, BCPS 10/07/2021 3:47 PM ED Clinical Pharmacist -  [phone]

## 2021-10-08 ENCOUNTER — Other Ambulatory Visit: Payer: Self-pay | Admitting: Cardiovascular Disease

## 2021-10-08 ENCOUNTER — Inpatient Hospital Stay (HOSPITAL_COMMUNITY)
Admit: 2021-10-08 | Discharge: 2021-10-08 | Disposition: A | Discharge status: Home / Self Care | Payer: 59 | Attending: Cardiovascular Disease | Admitting: Cardiovascular Disease

## 2021-10-08 ENCOUNTER — Inpatient Hospital Stay (HOSPITAL_COMMUNITY): Payer: 59

## 2021-10-08 ENCOUNTER — Encounter (HOSPITAL_COMMUNITY): Payer: Self-pay | Admitting: Family Medicine

## 2021-10-08 DIAGNOSIS — E46 Unspecified protein-calorie malnutrition: Secondary | ICD-10-CM

## 2021-10-08 DIAGNOSIS — R079 Chest pain, unspecified: Secondary | ICD-10-CM | POA: Diagnosis not present

## 2021-10-08 DIAGNOSIS — I6501 Occlusion and stenosis of right vertebral artery: Secondary | ICD-10-CM

## 2021-10-08 DIAGNOSIS — I259 Chronic ischemic heart disease, unspecified: Secondary | ICD-10-CM

## 2021-10-08 DIAGNOSIS — K219 Gastro-esophageal reflux disease without esophagitis: Secondary | ICD-10-CM

## 2021-10-08 DIAGNOSIS — R1110 Vomiting, unspecified: Secondary | ICD-10-CM | POA: Diagnosis not present

## 2021-10-08 DIAGNOSIS — I2510 Atherosclerotic heart disease of native coronary artery without angina pectoris: Secondary | ICD-10-CM

## 2021-10-08 DIAGNOSIS — Z720 Tobacco use: Secondary | ICD-10-CM

## 2021-10-08 DIAGNOSIS — J432 Centrilobular emphysema: Secondary | ICD-10-CM | POA: Diagnosis not present

## 2021-10-08 DIAGNOSIS — I739 Peripheral vascular disease, unspecified: Secondary | ICD-10-CM

## 2021-10-08 DIAGNOSIS — I10 Essential (primary) hypertension: Secondary | ICD-10-CM

## 2021-10-08 DIAGNOSIS — E7800 Pure hypercholesterolemia, unspecified: Secondary | ICD-10-CM

## 2021-10-08 DIAGNOSIS — R931 Abnormal findings on diagnostic imaging of heart and coronary circulation: Secondary | ICD-10-CM

## 2021-10-08 DIAGNOSIS — R102 Pelvic and perineal pain: Secondary | ICD-10-CM

## 2021-10-08 DIAGNOSIS — R634 Abnormal weight loss: Secondary | ICD-10-CM

## 2021-10-08 DIAGNOSIS — R569 Unspecified convulsions: Secondary | ICD-10-CM

## 2021-10-08 HISTORY — DX: Pure hypercholesterolemia, unspecified: E78.00

## 2021-10-08 HISTORY — DX: Essential (primary) hypertension: I10

## 2021-10-08 HISTORY — DX: Occlusion and stenosis of right vertebral artery: I65.01

## 2021-10-08 LAB — BASIC METABOLIC PANEL
Anion gap: 11 (ref 5–15)
BUN: 15 mg/dL (ref 8–23)
CO2: 21 mmol/L — ABNORMAL LOW (ref 22–32)
Calcium: 9.3 mg/dL (ref 8.9–10.3)
Chloride: 105 mmol/L (ref 98–111)
Creatinine, Ser: 0.96 mg/dL (ref 0.44–1.00)
GFR, Estimated: >60 mL/min (ref >60)
Glucose, Bld: 140 mg/dL — ABNORMAL HIGH (ref 70–99)
Potassium: 4.1 mmol/L (ref 3.5–5.1)
Sodium: 137 mmol/L (ref 135–145)

## 2021-10-08 LAB — LIPID PANEL
Cholesterol: 114 mg/dL (ref 0–200)
HDL: 41 mg/dL (ref >40)
Total CHOL/HDL Ratio: 2.8 RATIO
Triglycerides: 51 mg/dL (ref <150)
VLDL: 10 mg/dL (ref 0–40)

## 2021-10-08 LAB — HEPARIN LEVEL (UNFRACTIONATED): Heparin Unfractionated: 0.6 IU/mL (ref 0.30–0.70)

## 2021-10-08 LAB — CBC
HCT: 33.8 % — ABNORMAL LOW (ref 36.0–46.0)
Hemoglobin: 11.1 g/dL — ABNORMAL LOW (ref 12.0–15.0)
MCH: 30.4 pg (ref 26.0–34.0)
MCHC: 32.8 g/dL (ref 30.0–36.0)
MCV: 92.6 fL (ref 80.0–100.0)
Platelets: 196 10*3/uL (ref 150–400)
RBC: 3.65 MIL/uL — ABNORMAL LOW (ref 3.87–5.11)
RDW: 14.8 % (ref 11.5–15.5)
WBC: 6.3 10*3/uL (ref 4.0–10.5)
nRBC: 0 % (ref 0.0–0.2)

## 2021-10-08 LAB — RAPID URINE DRUG SCREEN, HOSP PERFORMED
Amphetamines: NOT DETECTED
Barbiturates: NOT DETECTED
Benzodiazepines: NOT DETECTED
Cocaine: NOT DETECTED
Opiates: NOT DETECTED
Tetrahydrocannabinol: POSITIVE — AB

## 2021-10-08 LAB — ECHOCARDIOGRAM COMPLETE
AR max vel: 2.26 cm2
AV Area VTI: 2.3 cm2
AV Area mean vel: 2.17 cm2
AV Mean grad: 4 mmHg
AV Peak grad: 8.5 mmHg
Ao pk vel: 1.46 m/s
Area-P 1/2: 2.82 cm2
Height: 66 in
S' Lateral: 2.2 cm
Weight: 1940.05 oz

## 2021-10-08 LAB — HEMOGLOBIN A1C
Hgb A1c MFr Bld: 6.1 % — ABNORMAL HIGH (ref 4.8–5.6)
Mean Plasma Glucose: 128.37 mg/dL

## 2021-10-08 LAB — MAGNESIUM: Magnesium: 2 mg/dL (ref 1.7–2.4)

## 2021-10-08 LAB — TROPONIN I (HIGH SENSITIVITY): Troponin I (High Sensitivity): 8 ng/L (ref <18)

## 2021-10-08 MED ORDER — METOPROLOL TARTRATE 5 MG/5ML IV SOLN
INTRAVENOUS | Status: AC
  Filled 2021-10-08: qty 5

## 2021-10-08 MED ORDER — PNEUMOCOCCAL VAC POLYVALENT 25 MCG/0.5ML IJ INJ
0.5000 mL | INJECTION | INTRAMUSCULAR | Status: AC
  Administered 2021-10-09: 0.5 mL via INTRAMUSCULAR
  Filled 2021-10-08: qty 0.5

## 2021-10-08 MED ORDER — IOHEXOL 350 MG/ML SOLN
95.0000 mL | Freq: Once | INTRAVENOUS | Status: AC | PRN
  Administered 2021-10-08: 95 mL via INTRAVENOUS

## 2021-10-08 MED ORDER — NITROGLYCERIN 0.4 MG SL SUBL
SUBLINGUAL_TABLET | SUBLINGUAL | Status: AC
  Filled 2021-10-08: qty 2

## 2021-10-08 MED ORDER — INFLUENZA VAC A&B SA ADJ QUAD 0.5 ML IM PRSY
0.5000 mL | PREFILLED_SYRINGE | INTRAMUSCULAR | Status: AC
Start: 2021-10-09 — End: 2021-10-09
  Administered 2021-10-09: 0.5 mL via INTRAMUSCULAR
  Filled 2021-10-08: qty 0.5

## 2021-10-08 MED ORDER — ACETAMINOPHEN 325 MG PO TABS
650.0000 mg | ORAL_TABLET | ORAL | Status: AC
  Administered 2021-10-08: 650 mg via ORAL
  Filled 2021-10-08: qty 2

## 2021-10-08 MED ORDER — CAPSAICIN 0.025 % EX CREA
TOPICAL_CREAM | Freq: Two times a day (BID) | CUTANEOUS | Status: DC
  Filled 2021-10-08 (×2): qty 60

## 2021-10-08 MED ORDER — HEPARIN SODIUM (PORCINE) 5000 UNIT/ML IJ SOLN
5000.0000 [IU] | Freq: Three times a day (TID) | INTRAMUSCULAR | Status: DC
  Administered 2021-10-08 – 2021-10-09 (×3): 5000 [IU] via SUBCUTANEOUS
  Filled 2021-10-08 (×3): qty 1

## 2021-10-08 MED ORDER — METOPROLOL TARTRATE 50 MG PO TABS: 50.0000 mg | ORAL_TABLET | Freq: Two times a day (BID) | ORAL | Status: DC

## 2021-10-08 NOTE — ED Notes (Signed)
Pt reports no CP

## 2021-10-08 NOTE — Progress Notes (Signed)
[place] for Heparin Indication: chest pain/ACS  No Known Allergies  Patient Measurements: Height: 5\' 6"  (167.6 cm) Weight: 55 kg (121 lb 4.1 oz) IBW/kg (Calculated) : 59.3 Heparin Dosing Weight: 55 kg  Vital Signs: Temp: 98.9 F (37.2 C) (02/06 1505) Temp Source: Oral (02/06 1505) BP: 128/74 (02/06 2300) Pulse Rate: 83 (02/06 2300)  Labs: Recent Labs    10/07/21 1530 10/07/21 1717 10/07/21 2305  HGB 13.5  --   --   HCT 40.7  --   --   PLT 218  --   --   LABPROT 13.2  --   --   INR 1.0  --   --   HEPARINUNFRC  --   --  0.35  CREATININE 1.03*  --   --   TROPONINIHS 7 7  --     Estimated Creatinine Clearance: 45.4 mL/min (A) (by C-G formula based on SCr of 1.03 mg/dL (H)).   Assessment: 61 yof presenting with SOB. Heparin per pharmacy consult placed for chest pain/ACS.  Patient is not on anticoagulation prior to arrival.  Hgb 13.5; plt 218  Initial heparin level 0.35  Goal of Therapy:  Heparin level 0.3-0.7 units/ml Monitor platelets by anticoagulation protocol: Yes   Plan:  Increase heparin to 750 units / hr Heparin level in AM  Continue to monitor H&H and platelets  Thank you Anette Guarneri, PharmD 10/08/2021 12:12 AM

## 2021-10-08 NOTE — ED Notes (Signed)
Breakfast Orders Placed °

## 2021-10-08 NOTE — Progress Notes (Signed)
Patient transferred from ED at 1425hrs.  Oriented to room and plan of care for shift.  Patient verbalized understanding.  Not oriented to time on arrival, but reoriented without problem.

## 2021-10-08 NOTE — Progress Notes (Incomplete)
Family Medicine Teaching Service Daily Progress Note Intern Pager: [phone]  Patient name: [name] Medical record number: [id number] Date of birth: 02/27/1951 Age: 71 y.o. Gender: female  Primary Care Provider: Shary Key, DO Consultants: Cardiology Code Status: Full  Pt Overview and Major Events to Date:  2/6 admitted 2/6 coronary CTA with coronary atherosclerotic disease in the LAD and left Circumflex. Echo with 60  to 65% EF. Myxomatous MV and TV  Assessment and Plan:  [name] is a 71 y.o. female presenting with chest pain. 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  Chest Pain   CAD of LAD and Left Circumflex   hypoxia on exertion Chest pain this morning was***.  Coronary CTA with coronary atherosclerotic disease in the LAD and left Circumflex. Vitals overnight showed heart rates of***.  With PT yesterday desaturated to 80% on room while walking and return to 90s after sitting down. 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  Nausea   protein calorie malnutriton   weight loss Likely secondary to cannabinoid hyperemesis syndrome.  Today patient was***. -Consider RD consult -Ensure -Colonoscopy outpatient -Zofran 4 mg every 6 hours as needed -Capsaicin gel -Regular diet  PAD   aortic atherosclerosis   HLD S/p common femoral endarterectomy with bypass. -Atorvastatin 80 mg daily  COPD   tobacco use disorder Chest x-ray with hyperinflated lungs.  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 No acute events overnight -Continue Keppra 500 mg twice daily -Seizure and delirium precautions  Hypertension Blood pressure has been*** -Metoprolol 50 mg daily -Monitor blood pressure  Constipation -MiraLAX as needed  Facial rash and nevus -Outpatient follow-up -ANA***  Chronic/stable History of CVA-continue ASA 81 mg and atorvastatin 80 mg GERD-Home Protonix 40 mg  FEN/GI: Regular diet PPx: *** Dispo: Home with home health PT pending medical work-up***  Subjective:  ***  Objective: Temp:  [98.3 F (36.8 C)-98.4 F (36.9 C)] 98.3 F (36.8 C) (02/07 1940) Pulse Rate:  [64-95] 69 (02/07 1940) Resp:  [13-24] 16 (02/07 1940) BP: (105-140)/(57-94) 111/68 (02/07 1940) SpO2:  [88 %-100 %] 97 % (02/07 1940) Weight:  [52.4 kg] 52.4 kg (02/07 1425) Physical Exam: General: *** Cardiovascular: *** Respiratory: *** Abdomen: *** Extremities: ***  Laboratory: Recent Labs  Lab 10/07/21 1530 10/08/21 0340  WBC 8.0 6.3  HGB 13.5 11.1*  HCT 40.7 33.8*  PLT 218 196   Recent Labs  Lab 10/07/21 1530 10/08/21 0340  NA 140 137  K 3.2* 4.1  CL 103 105  CO2 21* 21*  BUN 12 15  CREATININE 1.03* 0.96  CALCIUM 9.7 9.3  PROT 7.6  --   BILITOT 0.5  --   ALKPHOS 111  --   ALT 12  --   AST 22  --   GLUCOSE 155* 140*    ***  Imaging/Diagnostic Tests: Kathleen Heck, MD 10/08/2021, 10:03 PM PGY-***, [place] Intern pager: [phone], text pages welcome

## 2021-10-08 NOTE — Progress Notes (Signed)
Vitals stable with some abdominal discomfort/pain. Feels part of it is from lack of eating/eating again. Patient states wanting flu, pneumonia, and covid vaccinations while here. Will followup with day team.

## 2021-10-08 NOTE — Evaluation (Signed)
Physical Therapy Evaluation Patient Details Name: [name] MRN: [id number] DOB: 1950/12/17 Today's Date: 10/08/2021  History of Present Illness  71 yo female presenting to ED on 2/6 with chest pain, generalized weakness, and SOB. Coronary CTA and ECHO pending. 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  Clinical Impression  Pt was seen for mobility on hallway with good control of gait on RW, but desaturated to 80% with extended walk on room air.  Her home situation has 15 steps and so needs to demonstrate ability to climb steps with supervised assist, and maintain O2 sats on room air.  If not will recommend her to try with supplemental O2 for home.  Pt is otherwise able to walk and should be safe with family support and HHPT for follow up care on RW.  Follow acute PT goals as are outlined below.     Recommendations for follow up therapy are one component of a multi-disciplinary discharge planning process, led by the attending physician.  Recommendations may be updated based on patient status, additional functional criteria and insurance authorization.  Follow Up Recommendations Home health PT    Assistance Recommended at Discharge Intermittent Supervision/Assistance  Patient can return home with the following  A little help with walking and/or transfers;A little help with bathing/dressing/bathroom;Assistance with cooking/housework;Help with stairs or ramp for entrance;Assist for transportation    Equipment Recommendations Rolling walker (2 wheels)  Recommendations for Other Services       Functional Status Assessment Patient has had a recent decline in their functional status and demonstrates the ability to make significant improvements in function in a reasonable and predictable  amount of time.     Precautions / Restrictions Precautions Precautions: Fall Restrictions Weight Bearing Restrictions: No      Mobility  Bed Mobility Overal bed mobility: Modified Independent                  Transfers Overall transfer level: Modified independent Equipment used: Rolling walker (2 wheels)               General transfer comment: RW and no AD    Ambulation/Gait Ambulation/Gait assistance: Min guard Gait Distance (Feet): 140 Feet Assistive device: Rolling walker (2 wheels), 1 person hand held assist Gait Pattern/deviations: Step-through pattern, Decreased stride length, Drifts right/left, Narrow base of support Gait velocity: reduced Gait velocity interpretation: <1.31 ft/sec, indicative of household ambulator   General Gait Details: short strides due to her mild change of balance but is able to use RW well  Stairs            Wheelchair Mobility    Modified Rankin (Stroke Patients Only)       Balance Overall balance assessment: Needs assistance Sitting-balance support: Feet supported Sitting balance-Leahy Scale: Fair   Postural control: Posterior lean Standing balance support: Bilateral upper extremity supported, Single extremity supported Standing balance-Leahy Scale: Fair Standing balance comment: less than fair on turns with R side loss of balance                             Pertinent Vitals/Pain Pain Assessment Pain Assessment: Faces Faces Pain Scale: No hurt    Home Living Family/patient expects to be discharged to:: Private residence Living Arrangements: Alone Available Help at Discharge: Family;Available PRN/intermittently Type of Home: Apartment  Home Access: Stairs to enter Entrance Stairs-Rails: Psychiatric nurse of Steps: 15   Home Layout: One level Home Equipment: Cane - single Barista (2 wheels)      Prior Function Prior Level of Function : Independent/Modified  Independent             Mobility Comments: no walking devices ADLs Comments: ADLs, IADLs, and is apart of a motorcycle club. Currently not driving due to recent seizure and sister has been talking her to appointments     Hand Dominance   Dominant Hand: Right    Extremity/Trunk Assessment   Upper Extremity Assessment Upper Extremity Assessment: Defer to OT evaluation    Lower Extremity Assessment Lower Extremity Assessment: Generalized weakness    Cervical / Trunk Assessment Cervical / Trunk Assessment: Kyphotic (mild)  Communication   Communication: No difficulties  Cognition Arousal/Alertness: Awake/alert Behavior During Therapy: WFL for tasks assessed/performed Overall Cognitive Status: Within Functional Limits for tasks assessed                                 General Comments: memory changes about her details of home        General Comments General comments (skin integrity, edema, etc.): O2 sats stable, HR stable    Exercises     Assessment/Plan    PT Assessment Patient needs continued PT services  PT Problem List Cardiopulmonary status limiting activity;Decreased strength;Decreased balance;Decreased coordination       PT Treatment Interventions DME instruction;Gait training;Stair training;Functional mobility training;Therapeutic activities;Therapeutic exercise;Balance training;Neuromuscular re-education;Patient/family education    PT Goals (Current goals can be found in the Care Plan section)  Acute Rehab PT Goals Patient Stated Goal: to go home up the stairs PT Goal Formulation: With patient Time For Goal Achievement: 10/22/21 Potential to Achieve Goals: Good    Frequency Min 3X/week     Co-evaluation               AM-PAC PT "6 Clicks" Mobility  Outcome Measure Help needed turning from your back to your side while in a flat bed without using bedrails?: A Little Help needed moving from lying on your back to sitting on the  side of a flat bed without using bedrails?: A Little Help needed moving to and from a bed to a chair (including a wheelchair)?: A Little Help needed standing up from a chair using your arms (e.g., wheelchair or bedside chair)?: A Little Help needed to walk in hospital room?: A Little Help needed climbing 3-5 steps with a railing? : Total 6 Click Score: 16    End of Session Equipment Utilized During Treatment: Gait belt Activity Tolerance: Patient limited by fatigue;Treatment limited secondary to medical complications (Comment) Patient left: in bed;with call bell/phone within reach Nurse Communication: Mobility status;Other (comment) (loss of O2 sats with walk) PT Visit Diagnosis: Unsteadiness on feet (R26.81);Muscle weakness (generalized) (M62.81)    Time: 1324-4010 PT Time Calculation (min) (ACUTE ONLY): 34 min   Charges:   PT Evaluation $PT Eval Moderate Complexity: 1 Mod PT Treatments $Gait Training: 8-22 mins       Ramond Dial 10/08/2021, 5:46 PM  Mee Hives, PT PhD Acute Rehab Dept. Number: [place] and [place]

## 2021-10-08 NOTE — Progress Notes (Addendum)
Progress Note  Patient Name: Marine Lezotte Date of Encounter: 10/08/2021  [facility] Cardiologist: Belva Crome III, MD   Subjective   Brief episode of chest pain this morning, but resolved quickly. Was able to sleep last night without issues.   Inpatient Medications    Scheduled Meds:  amLODipine  5 mg Oral Daily   aspirin EC  81 mg Oral Daily   atorvastatin  80 mg Oral q1800   feeding supplement  237 mL Oral TID BM   fluticasone furoate-vilanterol  1 puff Inhalation Daily   And   umeclidinium bromide  1 puff Inhalation Daily   heparin injection (subcutaneous)  5,000 Units Subcutaneous Q8H   levETIRAcetam  500 mg Oral BID   [START ON 10/09/2021] metoprolol tartrate  50 mg Oral BID   pantoprazole  40 mg Oral Daily   Continuous Infusions:   PRN Meds: acetaminophen, ondansetron (ZOFRAN) IV, polyethylene glycol   Vital Signs    Vitals:   10/08/21 0630 10/08/21 0700 10/08/21 0730 10/08/21 0800  BP: 127/74 114/67 105/62 132/84  Pulse: 83 81 79 85  Resp: 16 16 15  (!) 23  Temp:      TempSrc:      SpO2: 97% 96% 95% 98%  Weight:      Height:        Intake/Output Summary (Last 24 hours) at 10/08/2021 0823 Last data filed at 10/08/2021 2426 Gross per 24 hour  Intake 712.6 ml  Output --  Net 712.6 ml   Last 3 Weights 10/07/2021 05/07/2021 04/09/2021  Weight (lbs) 121 lb 4.1 oz 120 lb 130 lb  Weight (kg) 55 kg 54.432 kg 58.968 kg      Telemetry    SR 70-90s - Personally Reviewed  ECG    No new tracing this morning  Physical Exam   GEN: No acute distress.   Neck: No JVD Cardiac: RRR, no murmurs, rubs, or gallops.  Respiratory: Clear to auscultation bilaterally. GI: Soft, nontender, non-distended  MS: No edema; No deformity. Neuro:  Nonfocal  Psych: Normal affect   Labs    High Sensitivity Troponin:   Recent Labs  Lab 10/07/21 1530 10/07/21 1717 10/08/21 0500  TROPONINIHS 7 7 8      Chemistry Recent Labs  Lab 10/07/21 1530 10/07/21 1717  10/08/21 0340  NA 140  --  137  K 3.2*  --  4.1  CL 103  --  105  CO2 21*  --  21*  GLUCOSE 155*  --  140*  BUN 12  --  15  CREATININE 1.03*  --  0.96  CALCIUM 9.7  --  9.3  MG  --  1.9 2.0  PROT 7.6  --   --   ALBUMIN 4.5  --   --   AST 22  --   --   ALT 12  --   --   ALKPHOS 111  --   --   BILITOT 0.5  --   --   GFRNONAA 59*  --  >60  ANIONGAP 16*  --  11    Lipids  Recent Labs  Lab 10/08/21 0340  CHOL 114  TRIG 51  HDL PENDING  LDLCALC NOT CALCULATED  CHOLHDL PENDING    Hematology Recent Labs  Lab 10/07/21 1530 10/08/21 0340  WBC 8.0 6.3  RBC 4.38 3.65*  HGB 13.5 11.1*  HCT 40.7 33.8*  MCV 92.9 92.6  MCH 30.8 30.4  MCHC 33.2 32.8  RDW 14.6 14.8  PLT 218 196   Thyroid  Recent Labs  Lab 10/07/21 1717  TSH 0.762    BNPNo results for input(s): BNP, PROBNP in the last 168 hours.  DDimer  Recent Labs  Lab 10/07/21 1717  DDIMER 0.57*     Radiology    CT Angio Chest PE W and/or Wo Contrast  Result Date: 10/07/2021 CLINICAL DATA:  Pulmonary embolism (PE) suspected, positive D-dimer. Dyspnea. EXAM: CT ANGIOGRAPHY CHEST WITH CONTRAST TECHNIQUE: Multidetector CT imaging of the chest was performed using the standard protocol during bolus administration of intravenous contrast. Multiplanar CT image reconstructions and MIPs were obtained to evaluate the vascular anatomy. RADIATION DOSE REDUCTION: This exam was performed according to the departmental dose-optimization program which includes automated exposure control, adjustment of the mA and/or kV according to patient size and/or use of iterative reconstruction technique. CONTRAST:  29mL OMNIPAQUE IOHEXOL 350 MG/ML SOLN COMPARISON:  None. FINDINGS: Cardiovascular: Adequate opacification of the pulmonary arterial tree. No intraluminal filling defect identified to suggest acute pulmonary embolism. Central pulmonary arteries are of normal caliber. Moderate multi-vessel coronary artery calcification. Cardiac size within  normal limits. No pericardial effusion. Mild atherosclerotic calcification within the thoracic aorta. No aortic aneurysm. There is partial anomalous pulmonary venous return with drainage of the left upper lobe to the left brachiocephalic vein. Mediastinum/Nodes: No enlarged mediastinal, hilar, or axillary lymph nodes. Thyroid gland, trachea, and esophagus demonstrate no significant findings. Lungs/Pleura: Mild centrilobular emphysema. No focal pulmonary nodules or infiltrates. No pneumothorax or pleural effusion. Central airways are widely patent. Upper Abdomen: No acute abnormality. Musculoskeletal: Remote superior endplate fractures of T6, T7, and T9. No acute bone abnormality. No lytic or blastic bone lesion. Review of the MIP images confirms the above findings. IMPRESSION: No acute pulmonary embolism. Moderate multi-vessel coronary artery calcification. 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 Mild centrilobular emphysema. Aortic Atherosclerosis (ICD10-I70.0) and Emphysema (ICD10-J43.9). Electronically Signed   By: Fidela Salisbury M.D.   On: 10/07/2021 22:53   DG Chest Portable 1 View  Result Date: 10/07/2021 CLINICAL DATA:  Chest pain and dyspnea for the past 3 days.  Smoker. EXAM: PORTABLE CHEST 1 VIEW COMPARISON:  04/09/2021 FINDINGS: Normal sized heart. Mild aortic arch calcifications. Hyperexpanded lungs with stable mildly prominent interstitial markings without Kerley lines or pleural fluid. Mild scoliosis. The bones appear osteopenic. IMPRESSION: No acute abnormality.  Stable changes of COPD. Electronically Signed   By: Claudie Revering M.D.   On: 10/07/2021 16:34    Cardiac Studies   N/a   Patient Profile     71 y.o. female with a hx of pulmonary hypertension, + tobacco, COPD, GERD, PAD, aortic atherosclerosis, HLD, with normal echo 2019 and no further need for cardiology follow up, hx CVA insurance denied loop recorder and pt did not complete event monitor, seizures  on keppra who was seen on 10/07/2021 for the evaluation of weakness, SOB, N/V and COPD  at the request of Dr. Regenia Skeeter.  Assessment & Plan    Chest pain/weakness, n/v: EKG on admission showed diffuse ST depression. hsTn remains negative x3. Initially placed on IV heparin, but with negative troponin and CT angio negative for PE will stop. Had brief episode of chest discomfort this morning. Blood pressures are marginal. Will stop IV nitro in order to allow for BB for improved HR on coronary CT.  -- coronary CTA pending ( did have a CT angio last evening, reviewed with CT and ok for additional contrast today) -- echo pending -- on asa,  statin, BB  HLD: lipid pending -- on atorvastatin 80mg  daily   PAD: hx of Lt common femoral endarterectomy with bypass to below knee popliteal artery  Hx of CVA: on ASA, statin  COPD/tobacco use: improved with nebs and solumedrol   HTN: blood pressures stable but marginal this morning. Will stop IV nitro to allow for BB for CT scan  For questions or updates, please contact [place] Please consult [email or web address] for contact info under        Signed, Reino Bellis, NP  10/08/2021, 8:23 AM    I have examined the patient and reviewed assessment and plan and discussed with patient.  Agree with above as stated.    Her 2 biggest complaints this morning are pain in her suprapubic area.  She states this has been ongoing and intermittent for some time.  She also has pain on her left buttocks.  She feels like it is asymmetric compared to the right.  No chest discomfort.  Discussed with Dr. Johnsie Cancel regarding evaluation.  Her troponins have been negative despite some ECG changes noted yesterday.  She really denies any cardiac symptoms.  We just want to try to rule out high risk disease with CTA.  Given her symptoms, would medically manage any low risk coronary disease.  We will repeat ECG today.  Heart rates currently in the low 70s while she is lying in bed.   Okay to stop nitro to allow for beta-blocker.  Larae Grooms

## 2021-10-08 NOTE — Progress Notes (Signed)
Echocardiogram 2D Echocardiogram has been performed.  [name] 10/08/2021, 10:48 AM

## 2021-10-08 NOTE — ED Notes (Signed)
Pt ambulated with PT . After ambulating, pt desatted to 80%. After resting for approx 1 min, sats returned to the 90s.

## 2021-10-08 NOTE — Progress Notes (Addendum)
ANTICOAGULATION CONSULT NOTE  Pharmacy Consult for Heparin Indication: chest pain/ACS  No Known Allergies  Patient Measurements: Height: 5\' 6"  (167.6 cm) Weight: 55 kg (121 lb 4.1 oz) IBW/kg (Calculated) : 59.3 Heparin Dosing Weight: 55 kg  Vital Signs: BP: 115/68 (02/07 0530) Pulse Rate: 76 (02/07 0530)  Labs: Recent Labs    10/07/21 1530 10/07/21 1717 10/07/21 2305 10/08/21 0340 10/08/21 0500 10/08/21 0527  HGB 13.5  --   --  11.1*  --   --   HCT 40.7  --   --  33.8*  --   --   PLT 218  --   --  196  --   --   LABPROT 13.2  --   --   --   --   --   INR 1.0  --   --   --   --   --   HEPARINUNFRC  --   --  0.35  --   --  0.60  CREATININE 1.03*  --   --  0.96  --   --   TROPONINIHS 7 7  --   --  8  --      Estimated Creatinine Clearance: 48.7 mL/min (by C-G formula based on SCr of 0.96 mg/dL).   Assessment: 8 yof presenting with SOB. Heparin per pharmacy consult placed for chest pain/ACS.  Patient is not on anticoagulation prior to arrival. Will follow up plans for duration of anticoagulation.  Hgb decreased to 11.1; plt 196  Repeat heparin level is therapeutic at 0.6  Goal of Therapy:  Heparin level 0.3-0.7 units/ml Monitor platelets by anticoagulation protocol: Yes   Plan:  Continue heparin at 750 units / hr Heparin level and CBC daily Continue to monitor H&H and platelets  Thank you for allowing pharmacy to participate in this patient's care.  Reatha Harps, PharmD PGY1 Pharmacy Resident 10/08/2021 7:06 AM Check [email or web address] for unit specific pharmacy number

## 2021-10-08 NOTE — Evaluation (Signed)
Occupational Therapy Evaluation Patient Details Name: [name] MRN: [id number] DOB: Jun 20, 1951 Today's Date: 10/08/2021   History of Present Illness 71 yo female presenting to ED on 2/6 with chest pain, generalized weakness, and SOB. Coronary CTA and ECHO pending. 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   Clinical Impression   PTA, pt was living alone and was independent; does not drive and sister assists with running errands. Currently, pt requires Supervision-Mod I for ADLs and functional mobility using RW. Pt presenting near baseline function and states she feels close to baseline again. Answered all pt questions. Recommend dc home once medically stable per physician. Pt would benefit from further mobility with nursing and mobility specialists. All acute OT needs met and will sign off. Thank you.     Recommendations for follow up therapy are one component of a multi-disciplinary discharge planning process, led by the attending physician.  Recommendations may be updated based on patient status, additional functional criteria and insurance authorization.   Follow Up Recommendations  No OT follow up    Assistance Recommended at Discharge PRN  Patient can return home with the following      Functional Status Assessment  Patient has had a recent decline in their functional status and demonstrates the ability to make significant improvements in function in a reasonable and predictable amount of time.  Equipment Recommendations  None recommended by OT    Recommendations for Other Services       Precautions / Restrictions Precautions Precautions: Fall      Mobility Bed Mobility Overal bed mobility: Independent                  Transfers Overall transfer level:  Modified independent Equipment used: Rolling walker (2 wheels)               General transfer comment: use of RW      Balance Overall balance assessment: Mild deficits observed, not formally tested                                         ADL either performed or assessed with clinical judgement   ADL Overall ADL's : Needs assistance/impaired                                       General ADL Comments: Pt performing ADLs at Supervision level. Pt completing oral care at sink and functional mobility in room     Vision         Perception     Praxis      Pertinent Vitals/Pain Pain Assessment Pain Assessment: Faces Faces Pain Scale: No hurt Pain Intervention(s): Monitored during session     Hand Dominance Right   Extremity/Trunk Assessment Upper Extremity Assessment Upper Extremity Assessment: Overall WFL for tasks assessed   Lower Extremity Assessment Lower Extremity Assessment: Defer to PT evaluation   Cervical / Trunk Assessment Cervical / Trunk Assessment: Normal   Communication Communication Communication: No difficulties   Cognition Arousal/Alertness: Awake/alert Behavior During Therapy: WFL for tasks assessed/performed Overall Cognitive Status: Within Functional Limits for tasks assessed  General Comments: Pt requiring increased time for orientation questions. RN reporting that earlier this afternoon, pt not recalling year.     General Comments  VSS on RA.    Exercises     Shoulder Instructions      Home Living Family/patient expects to be discharged to:: Private residence Living Arrangements: Alone Available Help at Discharge: Family;Available PRN/intermittently Type of Home: Apartment Home Access: Stairs to enter Entrance Stairs-Number of Steps: 15 Entrance Stairs-Rails: Right;Left Home Layout: One level     Bathroom Shower/Tub: Engineer, site: Standard     Home Equipment: Cane - single Barista (2 wheels)          Prior Functioning/Environment Prior Level of Function : Independent/Modified Independent             Mobility Comments: Does not use DME ADLs Comments: ADLs, IADLs, and is apart of a motorcycle club. Currently not driving due to recent seizure and sister has been talking her to appointments        OT Problem List: Impaired balance (sitting and/or standing);Decreased activity tolerance      OT Treatment/Interventions:      OT Goals(Current goals can be found in the care plan section) Acute Rehab OT Goals Patient Stated Goal: Go home OT Goal Formulation: With patient Time For Goal Achievement: 10/22/21 Potential to Achieve Goals: Good  OT Frequency:      Co-evaluation              AM-PAC OT "6 Clicks" Daily Activity     Outcome Measure Help from another person eating meals?: None Help from another person taking care of personal grooming?: None Help from another person toileting, which includes using toliet, bedpan, or urinal?: A Little Help from another person bathing (including washing, rinsing, drying)?: A Little Help from another person to put on and taking off regular upper body clothing?: None Help from another person to put on and taking off regular lower body clothing?: A Little 6 Click Score: 21   End of Session Equipment Utilized During Treatment: Rolling walker (2 wheels) Nurse Communication: Mobility status  Activity Tolerance: Patient tolerated treatment well Patient left: in chair;with call bell/phone within reach;with chair alarm set  OT Visit Diagnosis: Unsteadiness on feet (R26.81);Other abnormalities of gait and mobility (R26.89);Muscle weakness (generalized) (M62.81)                Time: 4827-0786 OT Time Calculation (min): 18 min Charges:  OT General Charges $OT Visit: 1 Visit OT Evaluation $OT Eval Low Complexity: [place],  OTR/L Acute Rehab Pager: [phone] Office: [place] 10/08/2021, 5:00 PM

## 2021-10-09 ENCOUNTER — Other Ambulatory Visit: Payer: Self-pay | Admitting: Student

## 2021-10-09 DIAGNOSIS — R110 Nausea: Secondary | ICD-10-CM

## 2021-10-09 DIAGNOSIS — R072 Precordial pain: Secondary | ICD-10-CM

## 2021-10-09 LAB — CBC
HCT: 33.4 % — ABNORMAL LOW (ref 36.0–46.0)
Hemoglobin: 11.1 g/dL — ABNORMAL LOW (ref 12.0–15.0)
MCH: 30.7 pg (ref 26.0–34.0)
MCHC: 33.2 g/dL (ref 30.0–36.0)
MCV: 92.3 fL (ref 80.0–100.0)
Platelets: 186 10*3/uL (ref 150–400)
RBC: 3.62 MIL/uL — ABNORMAL LOW (ref 3.87–5.11)
RDW: 14.8 % (ref 11.5–15.5)
WBC: 9.1 10*3/uL (ref 4.0–10.5)
nRBC: 0 % (ref 0.0–0.2)

## 2021-10-09 LAB — BASIC METABOLIC PANEL
Anion gap: 10 (ref 5–15)
BUN: 18 mg/dL (ref 8–23)
CO2: 25 mmol/L (ref 22–32)
Calcium: 8.9 mg/dL (ref 8.9–10.3)
Chloride: 104 mmol/L (ref 98–111)
Creatinine, Ser: 1.25 mg/dL — ABNORMAL HIGH (ref 0.44–1.00)
GFR, Estimated: 47 mL/min — ABNORMAL LOW (ref >60)
Glucose, Bld: 94 mg/dL (ref 70–99)
Potassium: 4.1 mmol/L (ref 3.5–5.1)
Sodium: 139 mmol/L (ref 135–145)

## 2021-10-09 LAB — ANA W/REFLEX IF POSITIVE: Anti Nuclear Antibody (ANA): NEGATIVE

## 2021-10-09 LAB — MAGNESIUM: Magnesium: 2 mg/dL (ref 1.7–2.4)

## 2021-10-09 MED ORDER — CAPSAICIN 0.025 % EX CREA: TOPICAL_CREAM | Freq: Two times a day (BID) | CUTANEOUS | 0 refills | Status: DC

## 2021-10-09 MED ORDER — METOPROLOL TARTRATE 25 MG PO TABS
25.0000 mg | ORAL_TABLET | Freq: Two times a day (BID) | ORAL | Status: DC
  Administered 2021-10-09: 25 mg via ORAL
  Filled 2021-10-09: qty 1

## 2021-10-09 MED ORDER — ONDANSETRON HCL 4 MG PO TABS: ORAL_TABLET | ORAL | 0 refills | Status: DC

## 2021-10-09 MED ORDER — METOPROLOL TARTRATE 25 MG PO TABS: 25.0000 mg | ORAL_TABLET | Freq: Two times a day (BID) | ORAL | 0 refills | Status: DC

## 2021-10-09 MED ORDER — ENSURE ENLIVE PO LIQD: 237.0000 mL | Freq: Three times a day (TID) | ORAL | 12 refills | Status: DC

## 2021-10-09 MED ORDER — PANTOPRAZOLE SODIUM 40 MG PO TBEC: 40.0000 mg | DELAYED_RELEASE_TABLET | Freq: Every day | ORAL | 0 refills | Status: DC

## 2021-10-09 NOTE — Progress Notes (Signed)
On room air. Ambulated twice to bathroom with nurse and 2 wheel walker with no desat or work of breathing. Maintained O2 above 90.

## 2021-10-09 NOTE — TOC Initial Note (Addendum)
Transition of Care [facility]) - Initial/Assessment Note    Patient Details  Name: [name] MRN: [id number] Date of Birth: [birth year]  Transition of Care [facility]) CM/SW Contact:    Bethena Roys, RN Phone Number: 10/09/2021, 12:08 PM  Clinical Narrative:   Case Manager spoke with patient regarding home health services. Patient is agreeable to home health services- Medicare.gov list provided to the patient and she chose [facility] since she has used before. Referral submitted to [facility] and start of care to begin within 24-48 hours post transition home. Case Manager ordered cane and rolling walker via [employer] for the patient-to be delivered to the room. Case Manager sent a secure message to the provider to place [facility] orders and F2F. No further needs identified at this time. Case Manager will continue to follow for additional transition of care needs.                Expected Discharge Plan: [place] Barriers to Discharge: No Barriers Identified   Patient Goals and CMS Choice Patient states their goals for this hospitalization and ongoing recovery are:: Patient wishes to return home with home health services. CMS Medicare.gov Compare Post Acute Care list provided to:: Patient Choice offered to / list presented to : Patient  Expected Discharge Plan and Services Expected Discharge Plan: [place] In-house Referral: NA Discharge Planning Services: CM Consult Post Acute Care Choice: [place] arrangements for the past 2 months: Apartment                 DME Arranged: Gilford Rile rolling, Cane DME Agency: [employer] Date DME Agency Contacted: 10/09/21 Time DME Agency Contacted: 42 Representative spoke with at DME Agency: Freda Munro HH Arranged: PT [place]: [place] Date [place]: 10/09/21 Time [place]: 8 Representative spoke with at [place]: [place] Arrangements/Services Living arrangements  for the past 2 months: Apartment Lives with:: Self (Patient states her grandchildren stays with her at times.) Patient language and need for interpreter reviewed:: Yes        Need for Family Participation in Patient Care: Yes (Comment) Care giver support system in place?: Yes (comment)   Criminal Activity/Legal Involvement Pertinent to Current Situation/Hospitalization: No - Comment as needed  Activities of Daily Living Home Assistive Devices/Equipment: None ADL Screening (condition at time of admission) Patient's cognitive ability adequate to safely complete daily activities?: Yes Is the patient deaf or have difficulty hearing?: No Does the patient have difficulty seeing, even when wearing glasses/contacts?: No Does the patient have difficulty concentrating, remembering, or making decisions?: No Patient able to express need for assistance with ADLs?: Yes Does the patient have difficulty dressing or bathing?: No Independently performs ADLs?: Yes (appropriate for developmental age) Does the patient have difficulty walking or climbing stairs?: No Weakness of Legs: Both Weakness of Arms/Hands: None  Permission Sought/Granted Permission sought to share information with : Case Manager, Family Supports, Investment banker, corporate granted to share info w AGENCY: Alvis Lemmings, [employer]        Emotional Assessment Appearance:: Appears stated age Attitude/Demeanor/Rapport: Engaged Affect (typically observed): Appropriate Orientation: : Oriented to Self, Oriented to Situation, Oriented to Place, Oriented to  Time Alcohol / Substance Use: Not Applicable Psych Involvement: No (comment)  Admission diagnosis:  Shortness of breath [R06.02] Chest pain [R07.9] Patient Active Problem List   Diagnosis Date Noted   Vertebral artery occlusion, 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[facility])    Weight loss    Lumbar disc herniation    Hypertensive retinopathy of right eye, grade 1 05/03/2014   Hypertensive retinopathy of left eye, grade 2 05/03/2014   Chronic abdominal pain 08/15/2013   Dyspnea 08/10/2013   Protein calorie malnutrition ([place]) 05/11/2013   Chronic vomiting 07/16/2012   Tobacco abuse    GASTROESOPHAGEAL REFLUX DISEASE 12/29/2006   PSORIASIS 12/29/2006   PCP:  Shary Key, DO Pharmacy:   [facility] [place], [place] [address] [facility] [address] [place] [address] Phone: [phone] Fax: [phone]  [facility] [address] [place] [address] Phone: [phone] Fax: [phone]  [id number] - [place], [place] - [address] [place] [address] Phone: [phone] Fax: [phone]     Social Determinants of Health (SDOH) Interventions    Readmission Risk Interventions No flowsheet data found.

## 2021-10-09 NOTE — Discharge Summary (Signed)
[facility] Discharge Summary  Patient name: [name] Medical record number: [id number] Date of birth: May 03, 1951 Age: 71 y.o. Gender: female Date of Admission: 10/07/2021  Date of Discharge: 10/09/2021 Admitting Physician: Lyndee Hensen, DO  Primary Care Provider: Shary Key, DO Consultants: Cardiology   Indication for Hospitalization: Chest Pain with ST depression  Discharge Diagnoses/Problem List:  Chronic vomiting GERD Tobacco Use Protein calorie malnutrition Weight loss Emphysema lung PAS Seizure Chest Pain Essential HTN Pure hypercholesterolemia  Disposition: Home with HHPT  Discharge Condition: Stable  Discharge Exam:  General: Well appearing, NAD, awake, alert, responsive to questions Head: Normocephalic atraumatic CV: Regular rate and rhythm no murmurs rubs or gallops Respiratory: Clear to ausculation bilaterally, no wheezes rales or crackles, chest rises symmetrically,  no increased work of breathing Abdomen: Soft, mildly-tender near ribs, non-distended, normoactive bowel sounds  Extremities: Moves upper and lower extremities freely, no edema in LE Neuro: No focal deficits Skin: No rashes or lesions visualized   Brief Hospital Course:  Chest Pain Presented with cute onset substernal squeezing chest pain that has had been waxing and waning since 10/05/2021.CXR showed hyperinflated lung fields but no evidence of lobar opacities, EKG showed ST segment depression diffusely. Troponins were 7>7>8. Cardiology had the patient started on IV NTG and heparin. 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 Cardiology discontinued the IV heparin and IV NTG. CT coronary angiogram was obtained which showed coronary atherosclerotic disease in the LAD and left circumflex. Echocardiogram showed  60 to 65% EF without regional wall abnormalities and myxomatous MV and TV. At end of hospitalization cardiology recommended continuing with ASA, statin, and beta blocker. Cardiology let us know she had a reassuring CTA FFR and had no further cardiac testing needed.    Myxomatous mitral valve and tricuspid valve On echocardiogram 2/7.  We will follow-up with cardiology outpatient.  Nausea   protein calorie malnutriton   weight loss 4 kg weight loss since August 2022.  Due for colonoscopy screening.  Has nausea likely secondary to cannabinoid hyperemesis syndrome.  Capsaicin gel was added for this as well as protonix.  Facial Rash   Abnormal Nevus   Redness over bridge of nose and onto both cheeks seborrheic dermatitis versus Lupoid butterfly rash. ANA was negative. Hyperkeratotic, hyperpigmented, irregularly shaped nevus over middle upper back.    Issues for Follow Up:  Fu with PCP for abnormal nevus  Due for colonoscopy screening. Outpatient workup for weight loss, patient seems to have decreased appetite.  Cardiology follow up for myxomatous valves  Significant Procedures: None  Significant Labs and Imaging:  Recent Labs  Lab 10/07/21 1530 10/08/21 0340 10/09/21 0507  WBC 8.0 6.3 9.1  HGB 13.5 11.1* 11.1*  HCT 40.7 33.8* 33.4*  PLT 218 196 186   Recent Labs  Lab 10/07/21 1530 10/07/21 1717 10/08/21 0340 10/09/21 0507  NA 140  --  137 139  K 3.2*  --  4.1 4.1  CL 103  --  105 104  CO2 21*  --  21* 25  GLUCOSE 155*  --  140* 94  BUN 12  --  15 18  CREATININE 1.03*  --  0.96 1.25*  CALCIUM 9.7  --  9.3 8.9  MG  --  1.9 2.0 2.0  ALKPHOS 111  --   --   --   AST 22  --   --   --  ALT 12  --   --   --   ALBUMIN 4.5  --   --   --       Results/Tests Pending at Time of Discharge:   Discharge Medications:  Allergies as of 10/09/2021   No Known Allergies      Medication List     STOP taking these medications    naproxen sodium 220 MG tablet Commonly known as:  ALEVE       TAKE these medications    albuterol 108 (90 Base) MCG/ACT inhaler Commonly known as: VENTOLIN HFA INHALE 2 PUFFS INTO THE LUNGS EVERY 4 HOURS AS NEEDED FOR WHEEZING OR SHORTNESS OF BREATH   amLODipine 5 MG tablet Commonly known as: NORVASC TAKE 1 TABLET(5 MG) BY MOUTH DAILY   aspirin EC 81 MG tablet Take 1 tablet (81 mg total) by mouth daily.   atorvastatin 80 MG tablet Commonly known as: LIPITOR Take 1 tablet (80 mg total) by mouth daily at 6 PM.   capsaicin 0.025 % cream Commonly known as: ZOSTRIX Apply topically 2 (two) times daily.   feeding supplement Liqd Take 237 mLs by mouth 3 (three) times daily between meals.   levETIRAcetam 500 MG tablet Commonly known as: Keppra Take 1 tablet (500 mg total) by mouth 2 (two) times daily.   metoprolol tartrate 25 MG tablet Commonly known as: LOPRESSOR Take 1 tablet (25 mg total) by mouth 2 (two) times daily.   nystatin powder Commonly known as: MYCOSTATIN/NYSTOP Apply 1 application topically 3 (three) times daily.   ondansetron 4 MG tablet Commonly known as: ZOFRAN TAKE 1 TABLET(4 MG) BY MOUTH EVERY 8 HOURS AS NEEDED FOR NAUSEA OR VOMITING   Trelegy Ellipta 100-62.5-25 MCG/ACT Aepb Generic drug: Fluticasone-Umeclidin-Vilant Inhale 1 puff into the lungs daily.               Durable Medical Equipment  (From admission, onward)           Start     Ordered   10/09/21 1213  For home use only DME Cane  Once        10/09/21 1213   10/08/21 1545  For home use only DME Walker rolling  Once       Question Answer Comment  Walker: With [place] Wheels   Patient needs a walker to treat with the following condition Gait difficulty      10/08/21 1544            Discharge Instructions: Please refer to Patient Instructions section of EMR for full details.  Patient was counseled important signs and symptoms that should prompt return to medical care, changes in medications, dietary instructions, activity  restrictions, and follow up appointments.   Follow-Up Appointments:  Follow-up Information     [facility] Follow up.   Specialty: Home Health Services Why: Physical Therapy-Office to call with visit times. Contact information: [address] [place] [address] [phone]         [facility] Follow up.   Why: Cane and rolling walker to be delivered to the room prior to discharge. Contact information: [address] [place] [address] [phone]         Jettie Booze, MD. Schedule an appointment as soon as possible for a visit.   Specialties: Cardiology, Radiology, Interventional Cardiology Why: Please make an appointment at your earliest convenience. Contact information: [address] [place] [address] [phone]  Gerrit Heck, MD 10/09/2021, 4:33 PM PGY-1, [place]

## 2021-10-09 NOTE — Progress Notes (Signed)
Physical Therapy Treatment Patient Details Name: [name] MRN: [id number] DOB: 11/14/1950 Today's Date: 10/09/2021   History of Present Illness 71 yo female presenting to ED on 2/6 with chest pain, generalized weakness, and SOB. Underwent coronary CTA showing CAD in LAD with abnormal FFR in the LAD territory but not felt to be hemodynamically significant. echo showed LVEF of 60-65% with no rWMA, normal RV. 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    PT Comments    Pt received in supine, agreeable to therapy session and with good participation and fair tolerance for seated/standing exercises and transfer training. Pt limited due to c/o nausea and weakness with symptomatic drop in BP with sit>stand transfer and unable to stand >3 minutes at RW prior to needing seated break, RN notified. Pt c/o poor vision (no glasses in hospital per pt report, she has pairs at home) and needing mod cues for navigation in room with RW and needed assist to order lunch due to unable to see menu. Pt up in chair with alarm on at end of session, pt encouraged to eat lunch as she reports she did not eat/drink much for breakfast. Pt continues to benefit from PT services to progress toward functional mobility goals. Plan to assess gait/stair training next session pending improved orthostatics. Orthostatic BPs Sitting 105/79 (88); HR 64 bpm (mild nausea)  Standing 85/58 (67); HR 70 bpm (mild nausea)  Standing after 3 min 87/63 (72); HR 75 bpm (increased nausea/weakness)  Sx nausea/weakness improved once seated    Recommendations for follow up therapy are one component of a multi-disciplinary discharge planning process, led by the attending physician.  Recommendations may be updated based on patient status, additional  functional criteria and insurance authorization.  Follow Up Recommendations  Home health PT     Assistance Recommended at Discharge Intermittent Supervision/Assistance  Patient can return home with the following A little help with walking and/or transfers;A little help with bathing/dressing/bathroom;Assistance with cooking/housework;Help with stairs or ramp for entrance;Assist for transportation   Equipment Recommendations  Rolling walker (2 wheels)    Recommendations for Other Services       Precautions / Restrictions Precautions Precautions: Fall Precaution Comments: orthostatic hypotention 2/8 Restrictions Weight Bearing Restrictions: No     Mobility  Bed Mobility Overal bed mobility: Modified Independent                  Transfers Overall transfer level: Needs assistance Equipment used: Rolling walker (2 wheels) Transfers: Sit to/from Stand Sit to Stand: Supervision, Min guard           General transfer comment: cues for safe hand placement when using RW; able to stand with supervision x5 in a row and hands on knees, min guard only when c/o fatigue/nausea after sx orthostasis    Ambulation/Gait Ambulation/Gait assistance: Min guard Gait Distance (Feet): 30 Feet Assistive device: Rolling walker (2 wheels), 1 person hand held assist Gait Pattern/deviations: Step-through pattern, Decreased stride length, Drifts right/left, Narrow base of support Gait velocity: reduced   Pre-gait activities: standing hip flexion and foot taps up to box in front and to her side, up to minA for standing therex with U HHA General Gait Details: short strides due to her mild change of balance but is able to use RW well; needs directional cues, distance limited due to evolving orthostatic symptoms and  soft BP despite seated/standing exercises   Stairs Stairs:  (defer due to soft BP and pt sx nausea/dizziness in stance)           Wheelchair Mobility    Modified Rankin  (Stroke Patients Only)       Balance Overall balance assessment: Needs assistance Sitting-balance support: Feet supported Sitting balance-Leahy Scale: Fair   Postural control: Posterior lean Standing balance support: Bilateral upper extremity supported, Single extremity supported Standing balance-Leahy Scale: Fair Standing balance comment: poor with standing exercises unsupported, needs HHA and minA at times for dynamic tasks                            Cognition Arousal/Alertness: Awake/alert Behavior During Therapy: WFL for tasks assessed/performed Overall Cognitive Status: Within Functional Limits for tasks assessed      General Comments: memory changes about her details of home, pt mildly confused and not understanding cues to "walk to the recliner" in her room, needed instructions for where to walk/turn. Pt reports she can't find her glasses which may be part of the issue?        Exercises Other Exercises Other Exercises: standing hip flexion BLE x10 reps x2 sets (with RW and HHA at times) Other Exercises: standing box taps BLE anterior x10 and lateral x10 reps Other Exercises: seated BUE AROM: chest press x10 reps Other Exercises: STS x5 reciprocal with hands on knees    General Comments General comments (skin integrity, edema, etc.): BP soft with transfers, see comments above; HR WFL 60's-70's; Pt requesting "extra salt" and didn't eat breakfast, encouraged her to increase protein and vegetable intake as foods she reports eating mostly are carbs/starches, pt mildly confused with this advice will need reinforcement, dietary notified who was entering room at end of session. Pt reports she does not like "berry" flavored supplement shake.      Pertinent Vitals/Pain Pain Assessment Pain Assessment: No/denies pain Faces Pain Scale: No hurt Pain Intervention(s): Monitored during session, Repositioned     PT Goals (current goals can now be found in the care plan  section) Acute Rehab PT Goals Patient Stated Goal: to go home up the stairs PT Goal Formulation: With patient Time For Goal Achievement: 10/22/21 Progress towards PT goals: Progressing toward goals (soft BP and nausea limiting)    Frequency    Min 3X/week      PT Plan Current plan remains appropriate    AM-PAC PT "6 Clicks" Mobility   Outcome Measure  Help needed turning from your back to your side while in a flat bed without using bedrails?: None Help needed moving from lying on your back to sitting on the side of a flat bed without using bedrails?: None Help needed moving to and from a bed to a chair (including a wheelchair)?: A Little Help needed standing up from a chair using your arms (e.g., wheelchair or bedside chair)?: A Little Help needed to walk in hospital room?: A Lot (mod cues due to pt visual deficit vs confusion) Help needed climbing 3-5 steps with a railing? : Total 6 Click Score: 17    End of Session Equipment Utilized During Treatment: Gait belt Activity Tolerance: Patient limited by fatigue;Treatment limited secondary to medical complications (Comment) (symptomatic orthostatic hypotension) Patient left: with call bell/phone within reach;in chair;with chair alarm set;Other (comment) (pt set up to eat lunch) Nurse Communication: Mobility status;Other (comment) (orthostatic hypotension and sx nausea, requesting anti-nausea meds) PT Visit Diagnosis: Unsteadiness  on feet (R26.81);Muscle weakness (generalized) (M62.81)     Time: 3601-6580 PT Time Calculation (min) (ACUTE ONLY): 36 min  Charges:  $Therapeutic Exercise: 8-22 mins $Therapeutic Activity: 8-22 mins                     Cleaven Demario P., PTA Acute Rehabilitation Services Pager: [phone] Office: [place] 10/09/2021, 11:58 AM

## 2021-10-09 NOTE — Discharge Instructions (Signed)
Dear [name],   Thank you so much for allowing [place] to be part of your care!  You were admitted to [facility] for chest pain. You're echocardiogram showed floppy valves, please follow up with your cardiology   POST-HOSPITAL & CARE INSTRUCTIONS Please follow up with cardiology Please let PCP/Specialists know of any changes that were made.  Please see medications section of this packet for any medication changes.   DOCTOR'S APPOINTMENT & FOLLOW UP CARE INSTRUCTIONS  Future Appointments  Date Time Provider [place]  11/05/2021  2:15 PM Alric Ran, MD [facility] None    RETURN PRECAUTIONS:   Take care and be well!  [facility]  [place], [place] [address] [phone]

## 2021-10-09 NOTE — Progress Notes (Addendum)
Progress Note  Patient Name: [name] Date of Encounter: 10/09/2021  [facility] Cardiologist: Sinclair Grooms, MD   Subjective   Feeling much better this morning. No chest pain, suprapubic pain has improved.   Inpatient Medications    Scheduled Meds:  amLODipine  5 mg Oral Daily   aspirin EC  81 mg Oral Daily   atorvastatin  80 mg Oral q1800   capsaicin   Topical BID   feeding supplement  237 mL Oral TID BM   fluticasone furoate-vilanterol  1 puff Inhalation Daily   And   umeclidinium bromide  1 puff Inhalation Daily   heparin injection (subcutaneous)  5,000 Units Subcutaneous Q8H   influenza vaccine adjuvanted  0.5 mL Intramuscular Tomorrow-1000   levETIRAcetam  500 mg Oral BID   metoprolol tartrate  50 mg Oral BID   pantoprazole  40 mg Oral Daily   pneumococcal 23 valent vaccine  0.5 mL Intramuscular Tomorrow-1000   Continuous Infusions:  PRN Meds: acetaminophen, ondansetron (ZOFRAN) IV, polyethylene glycol   Vital Signs    Vitals:   10/08/21 1940 10/09/21 0006 10/09/21 0509 10/09/21 0732  BP: 111/68 121/71 122/67 (!) 108/58  Pulse: 69 71 (!) 58 61  Resp: 16 18 16 17   Temp: 98.3 F (36.8 C) 98.3 F (36.8 C) 98.3 F (36.8 C) 98.2 F (36.8 C)  TempSrc: Oral Oral Oral Oral  SpO2: 97% 96% 98% 95%  Weight:      Height:        Intake/Output Summary (Last 24 hours) at 10/09/2021 0809 Last data filed at 10/08/2021 1658 Gross per 24 hour  Intake 161.39 ml  Output --  Net 161.39 ml   Last 3 Weights 10/08/2021 10/07/2021 05/07/2021  Weight (lbs) 115 lb 9.6 oz 121 lb 4.1 oz 120 lb  Weight (kg) 52.436 kg 55 kg 54.432 kg      Telemetry    SR/SB - Personally Reviewed  ECG    No new tracing this morning  Physical Exam   GEN: No acute distress.   Neck: No JVD Cardiac: RRR, no murmurs, rubs, or gallops.  Respiratory: Clear to auscultation bilaterally. GI: Soft, nontender, non-distended  MS: No edema; No deformity. Neuro:  Nonfocal  Psych: Normal  affect   Labs    High Sensitivity Troponin:   Recent Labs  Lab 10/07/21 1530 10/07/21 1717 10/08/21 0500  TROPONINIHS 7 7 8      Chemistry Recent Labs  Lab 10/07/21 1530 10/07/21 1717 10/08/21 0340 10/09/21 0507  NA 140  --  137 139  K 3.2*  --  4.1 4.1  CL 103  --  105 104  CO2 21*  --  21* 25  GLUCOSE 155*  --  140* 94  BUN 12  --  15 18  CREATININE 1.03*  --  0.96 1.25*  CALCIUM 9.7  --  9.3 8.9  MG  --  1.9 2.0 2.0  PROT 7.6  --   --   --   ALBUMIN 4.5  --   --   --   AST 22  --   --   --   ALT 12  --   --   --   ALKPHOS 111  --   --   --   BILITOT 0.5  --   --   --   GFRNONAA 59*  --  >60 47*  ANIONGAP 16*  --  11 10    Lipids  Recent Labs  Lab  10/08/21 0340  CHOL 114  TRIG 51  HDL 41  LDLCALC NOT CALCULATED  CHOLHDL 2.8    Hematology Recent Labs  Lab 10/07/21 1530 10/08/21 0340 10/09/21 0507  WBC 8.0 6.3 9.1  RBC 4.38 3.65* 3.62*  HGB 13.5 11.1* 11.1*  HCT 40.7 33.8* 33.4*  MCV 92.9 92.6 92.3  MCH 30.8 30.4 30.7  MCHC 33.2 32.8 33.2  RDW 14.6 14.8 14.8  PLT 218 196 186   Thyroid  Recent Labs  Lab 10/07/21 1717  TSH 0.762    BNPNo results for input(s): BNP, PROBNP in the last 168 hours.  DDimer  Recent Labs  Lab 10/07/21 1717  DDIMER 0.57*     Radiology    CT Angio Chest PE W and/or Wo Contrast  Result Date: 10/07/2021 CLINICAL DATA:  Pulmonary embolism (PE) suspected, positive D-dimer. Dyspnea. EXAM: CT ANGIOGRAPHY CHEST WITH CONTRAST TECHNIQUE: Multidetector CT imaging of the chest was performed using the standard protocol during bolus administration of intravenous contrast. Multiplanar CT image reconstructions and MIPs were obtained to evaluate the vascular anatomy. RADIATION DOSE REDUCTION: This exam was performed according to the departmental dose-optimization program which includes automated exposure control, adjustment of the mA and/or kV according to patient size and/or use of iterative reconstruction technique. CONTRAST:   4mL OMNIPAQUE IOHEXOL 350 MG/ML SOLN COMPARISON:  None. FINDINGS: Cardiovascular: Adequate opacification of the pulmonary arterial tree. No intraluminal filling defect identified to suggest acute pulmonary embolism. Central pulmonary arteries are of normal caliber. Moderate multi-vessel coronary artery calcification. Cardiac size within normal limits. No pericardial effusion. Mild atherosclerotic calcification within the thoracic aorta. No aortic aneurysm. There is partial anomalous pulmonary venous return with drainage of the left upper lobe to the left brachiocephalic vein. Mediastinum/Nodes: No enlarged mediastinal, hilar, or axillary lymph nodes. Thyroid gland, trachea, and esophagus demonstrate no significant findings. Lungs/Pleura: Mild centrilobular emphysema. No focal pulmonary nodules or infiltrates. No pneumothorax or pleural effusion. Central airways are widely patent. Upper Abdomen: No acute abnormality. Musculoskeletal: Remote superior endplate fractures of T6, T7, and T9. No acute bone abnormality. No lytic or blastic bone lesion. Review of the MIP images confirms the above findings. IMPRESSION: No acute pulmonary embolism. Moderate multi-vessel coronary artery calcification. 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 Mild centrilobular emphysema. Aortic Atherosclerosis (ICD10-I70.0) and Emphysema (ICD10-J43.9). Electronically Signed   By: Fidela Salisbury M.D.   On: 10/07/2021 22:53   CT CORONARY MORPH W/CTA COR W/SCORE W/CA W/CM &/OR WO/CM  Addendum Date: 10/08/2021   ADDENDUM REPORT: 10/08/2021 14:33 EXAM: OVER-READ INTERPRETATION  CT CHEST The following report is an over-read performed by radiologist Dr. Jason [facility] on 10/08/2021. This over-read does not include interpretation of cardiac or coronary anatomy or pathology. The coronary CTA interpretation by the cardiologist is attached. COMPARISON:  CTA chest 10/07/2021 FINDINGS: Vascular:  Coronary atherosclerotic disease in the LAD and left circumflex. Normal filling of the left atrial appendage. No evidence of thrombus. Normal size main pulmonary artery. No evidence of central pulmonary embolism. Mediastinum/Nodes: No lymphadenopathy within the field of view. Lungs/Pleura: No focal airspace disease, suspicious pulmonary nodule, pleural effusion, or pneumothorax within the field of view. Upper Abdomen: No acute abnormality. Musculoskeletal: No acute osseous abnormality.No suspicious lytic or blastic lesions. IMPRESSION: No acute extracardiac findings in the chest. Electronically Signed   By: Maurine Simmering M.D.   On: 10/08/2021 14:33   Result Date: 10/08/2021 CLINICAL DATA:  Chest pain EXAM: Cardiac CTA MEDICATIONS: Sub lingual nitro. 4mg  and  lopressor TECHNIQUE: The patient was scanned on a [employer] 448 slice scanner. Gantry rotation speed was 250 msecs. Collimation was .6 mm. A 100 kV prospective scan was triggered in the ascending thoracic aorta at 140 HU's Full mA was used between 35% and 75% of the R-R interval. Average HR during the scan was 71 bpm. The 3D data set was interpreted on a dedicated work station using MPR, MIP and VRT modes. A total of 80 cc of contrast was used. FINDINGS: Non-cardiac: See separate report from [facility]. No significant findings on limited lung and soft tissue windows. Calcium Score: Significant calcium in LAD/LCX Coronary Arteries: Co- more left dominant with no anomalies LM: Normal LAD: 50-69% calcified ostial stenosis 50-69% % calcified proximal stenosis 40-69% calcified mid vessel stenosis 50-69% distal stenosis D1: Normal Circumflex: 25-49% calcified proximal stenosis OM1: Normal OM2: Normal PDA/LPL: Normal RCA: Normal IMPRESSION: 1.  Calcium score 676 which is 97 th percentile for age/sex 2.  Normal ascending thoracic aorta 2.8 cm 3. Left dominant possibly obstructive CAD in LAD see description above Study sent for [facility] CT [place]  Electronically Signed: By: Jenkins Rouge M.D. On: 10/08/2021 13:02   DG Chest Portable 1 View  Result Date: 10/07/2021 CLINICAL DATA:  Chest pain and dyspnea for the past 3 days.  Smoker. EXAM: PORTABLE CHEST 1 VIEW COMPARISON:  04/09/2021 FINDINGS: Normal sized heart. Mild aortic arch calcifications. Hyperexpanded lungs with stable mildly prominent interstitial markings without Kerley lines or pleural fluid. Mild scoliosis. The bones appear osteopenic. IMPRESSION: No acute abnormality.  Stable changes of COPD. Electronically Signed   By: Claudie Revering M.D.   On: 10/07/2021 16:34   CT CORONARY FRACTIONAL FLOW RESERVE DATA PREP  Result Date: 10/08/2021 CLINICAL DATA:  CAD EXAM: FFR CT TECHNIQUE: The best systolic and diastolic phases of the patients gated cardiac CTA sent to [facility] for hemodynamic analysis FINDINGS: FFR normal in non dominant RCA and LXC. FFR CT abnormal but not in hemodynamically significant range LAD: Proximal 0.94 mid 0.89 and distal 0.84 IMPRESSION: FFR CT abnormal in LAD territory but not in hemodynamically significant range see above [place] Electronically Signed   By: Jenkins Rouge M.D.   On: 10/08/2021 12:32   ECHOCARDIOGRAM COMPLETE  Result Date: 10/08/2021    ECHOCARDIOGRAM REPORT   Patient Name:   HARSIMRAN WESTMAN Date of Exam: 10/08/2021 Medical Rec #:  [id number]     Height:       66.0 in Accession #:    [id number]    Weight:       121.3 lb Date of Birth:  [birth year]      BSA:          1.617 m Patient Age:    71 years      BP:           114/67 mmHg Patient Gender: F             HR:           81 bpm. Exam Location:  Inpatient Procedure: 2D Echo and Saline Contrast Bubble Study Indications:    Chest pain  History:        Patient has prior history of Echocardiogram examinations, most                 recent 11/04/2019. Stroke and COPD.  Sonographer:    Arlyss Gandy Referring Phys: [place]  1. Left ventricular ejection fraction, by estimation, is 60 to 65%.  The left ventricle has normal function. The left ventricle has no regional wall motion abnormalities. Left ventricular diastolic parameters are indeterminate.  2. Right ventricular systolic function is normal. The right ventricular size is normal. There is normal pulmonary artery systolic pressure.  3. The mitral valve is myxomatous. Mild mitral valve regurgitation. No evidence of mitral stenosis.  4. The tricuspid valve is myxomatous. Tricuspid valve regurgitation is mild to moderate.  5. The aortic valve is tricuspid. There is mild calcification of the aortic valve. Aortic valve regurgitation is not visualized. Aortic valve sclerosis is present, with no evidence of aortic valve stenosis.  6. The inferior vena cava is normal in size with greater than 50% respiratory variability, suggesting right atrial pressure of 3 mmHg.  7. Agitated saline contrast bubble study was negative, with no evidence of any interatrial shunt, but the saline contrast opacification of the right atrium was inadequate to exclude an intermittent right to left shunt. FINDINGS  Left Ventricle: Left ventricular ejection fraction, by estimation, is 60 to 65%. The left ventricle has normal function. The left ventricle has no regional wall motion abnormalities. The left ventricular internal cavity size was normal in size. There is  no left ventricular hypertrophy. Left ventricular diastolic parameters are indeterminate. Right Ventricle: The right ventricular size is normal. No increase in right ventricular wall thickness. Right ventricular systolic function is normal. There is normal pulmonary artery systolic pressure. The tricuspid regurgitant velocity is 2.47 m/s, and  with an assumed right atrial pressure of 3 mmHg, the estimated right ventricular systolic pressure is 81.0 mmHg. Left Atrium: Left atrial size was normal in size. Right Atrium: Right atrial size was normal in size. Pericardium: There is no evidence of pericardial effusion. Mitral  Valve: The mitral valve is myxomatous. Mild mitral valve regurgitation, with eccentric posteriorly directed jet. No evidence of mitral valve stenosis. Tricuspid Valve: The tricuspid valve is myxomatous. Tricuspid valve regurgitation is mild to moderate. No evidence of tricuspid stenosis. Aortic Valve: The aortic valve is tricuspid. There is mild calcification of the aortic valve. Aortic valve regurgitation is not visualized. Aortic valve sclerosis is present, with no evidence of aortic valve stenosis. Aortic valve mean gradient measures 4.0 mmHg. Aortic valve peak gradient measures 8.5 mmHg. Aortic valve area, by VTI measures 2.30 cm. Pulmonic Valve: The pulmonic valve was not well visualized. Pulmonic valve regurgitation is not visualized. No evidence of pulmonic stenosis. Aorta: The aortic root is normal in size and structure. Venous: The inferior vena cava is normal in size with greater than 50% respiratory variability, suggesting right atrial pressure of 3 mmHg. IAS/Shunts: There is redundancy of the interatrial septum. No atrial level shunt detected by color flow Doppler. Agitated saline contrast was given intravenously to evaluate for intracardiac shunting. Agitated saline contrast bubble study was negative, with no evidence of any interatrial shunt.  LEFT VENTRICLE PLAX 2D LVIDd:         3.50 cm   Diastology LVIDs:         2.20 cm   LV e' medial:    7.94 cm/s LV PW:         0.90 cm   LV E/e' medial:  14.2 LV IVS:        1.10 cm   LV e' lateral:   8.27 cm/s LVOT diam:     2.00 cm   LV E/e' lateral: 13.7 LV SV:         68 LV SV Index:   42 LVOT Area:  3.14 cm  RIGHT VENTRICLE             IVC RV Basal diam:  3.90 cm     IVC diam: 1.70 cm RV Mid diam:    2.90 cm RV S prime:     10.30 cm/s TAPSE (M-mode): 2.4 cm LEFT ATRIUM             Index        RIGHT ATRIUM           Index LA diam:        3.30 cm 2.04 cm/m   RA Area:     10.90 cm LA Vol (A2C):   42.6 ml 26.35 ml/m  RA Volume:   21.10 ml  13.05 ml/m LA  Vol (A4C):   48.2 ml 29.81 ml/m LA Biplane Vol: 48.1 ml 29.75 ml/m  AORTIC VALVE AV Area (Vmax):    2.26 cm AV Area (Vmean):   2.17 cm AV Area (VTI):     2.30 cm AV Vmax:           146.00 cm/s AV Vmean:          97.100 cm/s AV VTI:            0.295 m AV Peak Grad:      8.5 mmHg AV Mean Grad:      4.0 mmHg LVOT Vmax:         105.00 cm/s LVOT Vmean:        67.200 cm/s LVOT VTI:          0.216 m LVOT/AV VTI ratio: 0.73  AORTA Ao Root diam: 2.80 cm Ao Asc diam:  2.90 cm MITRAL VALVE                TRICUSPID VALVE MV Area (PHT): 2.82 cm     TR Peak grad:   24.4 mmHg MV Decel Time: 269 msec     TR Vmax:        247.00 cm/s MV E velocity: 113.00 cm/s MV A velocity: 121.00 cm/s  SHUNTS MV E/A ratio:  0.93         Systemic VTI:  0.22 m                             Systemic Diam: 2.00 cm Sanda Klein MD Electronically signed by Sanda Klein MD Signature Date/Time: 10/08/2021/11:16:55 AM    Final     Cardiac Studies   Echo: 10/08/21  IMPRESSIONS     1. Left ventricular ejection fraction, by estimation, is 60 to 65%. The  left ventricle has normal function. The left ventricle has no regional  wall motion abnormalities. Left ventricular diastolic parameters are  indeterminate.   2. Right ventricular systolic function is normal. The right ventricular  size is normal. There is normal pulmonary artery systolic pressure.   3. The mitral valve is myxomatous. Mild mitral valve regurgitation. No  evidence of mitral stenosis.   4. The tricuspid valve is myxomatous. Tricuspid valve regurgitation is  mild to moderate.   5. The aortic valve is tricuspid. There is mild calcification of the  aortic valve. Aortic valve regurgitation is not visualized. Aortic valve  sclerosis is present, with no evidence of aortic valve stenosis.   6. The inferior vena cava is normal in size with greater than 50%  respiratory variability, suggesting right atrial pressure of 3 mmHg.   7. Agitated saline contrast bubble study  was  negative, with no evidence  of any interatrial shunt, but the saline contrast opacification of the  right atrium was inadequate to exclude an intermittent right to left  shunt.   FINDINGS   Left Ventricle: Left ventricular ejection fraction, by estimation, is 60  to 65%. The left ventricle has normal function. The left ventricle has no  regional wall motion abnormalities. The left ventricular internal cavity  size was normal in size. There is   no left ventricular hypertrophy. Left ventricular diastolic parameters  are indeterminate.   Right Ventricle: The right ventricular size is normal. No increase in  right ventricular wall thickness. Right ventricular systolic function is  normal. There is normal pulmonary artery systolic pressure. The tricuspid  regurgitant velocity is 2.47 m/s, and   with an assumed right atrial pressure of 3 mmHg, the estimated right  ventricular systolic pressure is 67.6 mmHg.   Left Atrium: Left atrial size was normal in size.   Right Atrium: Right atrial size was normal in size.   Pericardium: There is no evidence of pericardial effusion.   Mitral Valve: The mitral valve is myxomatous. Mild mitral valve  regurgitation, with eccentric posteriorly directed jet. No evidence of  mitral valve stenosis.   Tricuspid Valve: The tricuspid valve is myxomatous. Tricuspid valve  regurgitation is mild to moderate. No evidence of tricuspid stenosis.   Aortic Valve: The aortic valve is tricuspid. There is mild calcification  of the aortic valve. Aortic valve regurgitation is not visualized. Aortic  valve sclerosis is present, with no evidence of aortic valve stenosis.  Aortic valve mean gradient measures  4.0 mmHg. Aortic valve peak gradient measures 8.5 mmHg. Aortic valve area,  by VTI measures 2.30 cm.   Pulmonic Valve: The pulmonic valve was not well visualized. Pulmonic valve  regurgitation is not visualized. No evidence of pulmonic stenosis.   Aorta: The  aortic root is normal in size and structure.   Venous: The inferior vena cava is normal in size with greater than 50%  respiratory variability, suggesting right atrial pressure of 3 mmHg.   IAS/Shunts: There is redundancy of the interatrial septum. No atrial level  shunt detected by color flow Doppler. Agitated saline contrast was given  intravenously to evaluate for intracardiac shunting. Agitated saline  contrast bubble study was negative,  with no evidence of any interatrial shunt.   Patient Profile     71 y.o. female with a hx of pulmonary hypertension, + tobacco, COPD, GERD, PAD, aortic atherosclerosis, HLD, with normal echo 2019 and no further need for cardiology follow up, hx CVA insurance denied loop recorder and pt did not complete event monitor, seizures on keppra who was seen on 10/07/2021 for the evaluation of weakness, SOB, N/V and COPD  at the request of Dr. Regenia Skeeter.  Assessment & Plan    Chest pain/weakness, n/v/suprapubic pain: EKG on admission showed diffuse ST depression. hsTn remains negative x3. Initially placed on IV heparin, but stopped with negative troponin and CT angio negative for PE. Underwent coronary CTA showing CAD in LAD with abnormal FFR in the LAD territory but not felt to be hemodynamically significant. Recommendations for ongoing risk factor modifications. Repeat EKG ordered for this morning. -- echo showed LVEF of 60-65% with no rWMA, normal RV -- continue on asa, statin, BB   HLD:  -- on atorvastatin 80mg  daily    PAD: hx of Lt common femoral endarterectomy with bypass to below knee popliteal artery   Hx of CVA: on ASA,  statin   COPD/tobacco use: improved with nebs and solumedrol    HTN: blood pressures stable, soft at times. -- currently on norvasc 5mg  daily, metoprolol 50mg  BID. HRs in the 50-60 range. Will reduce metoprolol to 25mg  BID   For questions or updates, please contact [place] Please consult [email or web address] for contact info under         Signed, Reino Bellis, NP  10/09/2021, 8:09 AM    I have examined the patient and reviewed assessment and plan and discussed with patient.  Agree with above as stated.    Reassuring CTA FFR.  I stressed the importance of smoking cessation. No further cardiac testing needed.  Continue aggressive risk factor modification.   Wheezing on exam today. ECG from today is normal.  All of the prior ST changes have resolved.  I wonder if she was hypoxic from a respiratory cause that created the ECG changes.  No further cardiac testing planned.   Larae Grooms

## 2021-10-09 NOTE — Progress Notes (Signed)
Initial Nutrition Assessment  DOCUMENTATION CODES:  Severe malnutrition in context of chronic illness  INTERVENTION:  Continue regular diet as ordered Ensure Enlive po TID, each supplement provides 350 kcal and 20 grams of protein.  NUTRITION DIAGNOSIS:  Severe Malnutrition (in the context of chronic illness) related to poor appetite as evidenced by severe fat depletion, severe muscle depletion.  GOAL:  Patient will meet greater than or equal to 90% of their needs  MONITOR:  PO intake, Supplement acceptance, Labs, Weight trends  REASON FOR ASSESSMENT:  Consult Assessment of nutrition requirement/status  ASSESSMENT:  71 year old female with hx of COPD (bronchitis), GERD, hx CVA, HTN, and HLD presented to ED with chest pain.  Pt reports of a onset of 2 weeks of weakness, cough, chills, and back pain. Also endorses vomiting daily x 1 week.   Pt resting in bed at the time of visit. Just finished working with PT and about to eat lunch.   Discussed weight with pt. 11.2% weight loss noted in the last 6 months (8/9-2/7). Severe muscle and fat wasting detected on exam. Pt reports that in the past few years, she has lost from her UBW of ~200 lb now down to 115 lb. Cites poor appetite as a contributing factor.  Pt reports that she eats one meal a day. Usually will go and pick up a grilled chicken sandwich from [employer] or a burger from a fast-food restaurant. States that she drinks soda throughout the day.   Discussed nutrition supplements with pt and the need for more consistent nutrition. States that her neighbor brought her some ensure, but she has not tried them yet. Willing to try them while admitted.    Average Meal Intake: 2/7: 100% intake x 1 recorded meals  Nutritionally Relevant Medications: Scheduled Meds:  atorvastatin  80 mg Oral q1800   feeding supplement  237 mL Oral TID BM   levETIRAcetam  500 mg Oral BID   pantoprazole  40 mg Oral Daily   PRN Meds: ondansetron,  polyethylene glycol  Labs Reviewed: Creatinine 1.25  NUTRITION - FOCUSED PHYSICAL EXAM: Flowsheet Row Most Recent Value  Orbital Region Moderate depletion  Upper Arm Region Severe depletion  Thoracic and Lumbar Region Severe depletion  Buccal Region Moderate depletion  Temple Region Moderate depletion  Clavicle Bone Region Severe depletion  Clavicle and Acromion Bone Region Severe depletion  Scapular Bone Region Severe depletion  Dorsal Hand Severe depletion  Patellar Region Severe depletion  Anterior Thigh Region Severe depletion  Posterior Calf Region Severe depletion  Edema (RD Assessment) None  Hair Reviewed  Eyes Reviewed  Mouth Reviewed  Skin Reviewed  Nails Reviewed   Diet Order:   Diet Order             Diet regular Room service appropriate? Yes; Fluid consistency: Thin  Diet effective now                   EDUCATION NEEDS:  No education needs have been identified at this time  Skin:  Skin Assessment: Reviewed RN Assessment  Last BM:  2/3  Height:  Ht Readings from Last 1 Encounters:  10/08/21 5\' 6"  (1.676 m)    Weight:  Wt Readings from Last 1 Encounters:  10/08/21 52.4 kg    Ideal Body Weight:  59.1 kg  BMI:  Body mass index is 18.66 kg/m.  Estimated Nutritional Needs:  Kcal:  1600-1800 kcal/d Protein:  80-90 g/d Fluid:  > 1.8 L/d   Ranell Patrick, RD,  LDN Clinical Dietitian RD pager # available in [place]  After hours/weekend pager # available in [facility]

## 2021-10-09 NOTE — Progress Notes (Signed)
CSW received consult for resources for transportation needs for patient. CSW spoke with patient at bedside and provided patient with transportation resources. Patient accepted. All questions answered. No further questions reported at this time.

## 2021-10-09 NOTE — Progress Notes (Signed)
FPTS Brief Progress Note  S: Patient was resting in bed and requesting something for her nausea.    O: BP 121/71 (BP Location: Right Arm)    Pulse 71    Temp 98.3 F (36.8 C) (Oral)    Resp 18    Ht 5\' 6"  (1.676 m)    Wt 52.4 kg    SpO2 96%    BMI 18.66 kg/m   General: NAD, pleasant, able to participate in exam Respiratory: No respiratory distress Skin: warm and dry, no rashes noted Psych: Normal affect and mood    A/P: Treatment plan per day team -Zofran prn on for nausea and vomiting -Labs ordered for the AM   Erskine Emery, MD 10/09/2021, 2:02 AM PGY-1, [facility] Night Resident  Please page [phone] with questions.

## 2021-10-17 ENCOUNTER — Telehealth: Payer: Self-pay

## 2021-10-17 NOTE — Telephone Encounter (Signed)
Clair Gulling [facility] PT LVM on nurse line requesting verbal orders for home health nursing. Clair Gulling also reports some concerns with patient. Clair Gulling reports nausea and weight loss over the last 6 months.   Verbal orders left on Jim's VM for [facility] nursing.   I called patient to discuss. Patient reports her weight loss and nausea are "nothing new for her." History of symptoms in chart review. Patient would like to speak with PCP about nausea and some of her medications.   Patient scheduled for March to discuss.

## 2021-10-25 ENCOUNTER — Ambulatory Visit (INDEPENDENT_AMBULATORY_CARE_PROVIDER_SITE_OTHER): Payer: 59 | Admitting: Family Medicine

## 2021-10-25 ENCOUNTER — Other Ambulatory Visit: Payer: Self-pay

## 2021-10-25 VITALS — BP 149/74 | HR 61 | Ht 66.0 in | Wt 124.1 lb

## 2021-10-25 DIAGNOSIS — R634 Abnormal weight loss: Secondary | ICD-10-CM

## 2021-10-25 DIAGNOSIS — R1110 Vomiting, unspecified: Secondary | ICD-10-CM

## 2021-10-25 NOTE — Patient Instructions (Addendum)
I placed a referral for GI.  They should call you in the next 1 to 2 weeks.  If you develop any trouble breathing, vomiting to the extent you cannot keep down fluids, dizziness, lightheadedness, or other concerning symptoms do not you to return or go to the emergency department.  I want you to schedule follow-up appointment with your primary doctor in the next 1 to 2 weeks.

## 2021-10-25 NOTE — Progress Notes (Signed)
° ° °  SUBJECTIVE:   CHIEF COMPLAINT / HPI:   Emesis, chronic cough: 71 year old female presenting with the above.  She also endorses she continues to feel bad since leaving the hospital on 2/8. She was seen there for chest pain in the setting of chronic vomiting. She had vomiting prior to the hospital and states she vomits about one time per day since then.  She states that the Zofran does seem to help her vomiting.  She states she feels she continues to lose weight. She states she can keep some food and fluids down. No diarrhea but she thinks her physical therapy group said she had a fever one day this week. No shortness of breath.  Cough has been consistent since the hospital. Complains of "general body aches" for a few months. Her main complaint is the vomiting. She has been using zofran for this about 2x per day.  PERTINENT  PMH / PSH: Emphysema  OBJECTIVE:   BP (!) 149/74    Pulse 61    Ht 5\' 6"  (1.676 m)    Wt 124 lb 2 oz (56.3 kg)    SpO2 96%    BMI 20.03 kg/m    General: NAD, pleasant, able to participate in exam HEENT: No pharyngeal erythema, moist mucous membrane Cardiac: RRR, no murmurs. Respiratory: Breath sounds with wheezing, rhonchi, and a few occasional crackles throughout, no respiratory distress on room air, poor air movement particularly in the upper regions bilaterally. Abdomen: Bowel sounds present, mild discomfort generally but no acute surgical findings Skin: warm and dry, no rashes noted  ASSESSMENT/PLAN:    Emesis   weight loss Assessment: 71 y.o. female with chronic emesis and weight loss.  She was recently hospitalized with chest pain in the setting of emesis.  Her emesis improved to about 1 time per day with use of Zofran.  She was previously referred to GI for this but it appears she never responded when they attempted to call her.  I will place this referral today and discussed this with her.  Discussed that she should get in with her primary doctor for a  physical and to continue to see what we can do to help with her health.  Clinically she does not appear dehydrated at this time and she endorses she is able to keep down fluids.  She has no signs of a current severe infection, is not been experiencing fever, has no shortness of breath, and has no severe abdominal pain.  I think reasonable to recheck her CBC and CMP today.  She will follow-up with her primary doctor in 2 weeks and I have placed the referral for GI.  Elevated blood pressure: She has not taken her bp meds today. Recommend follow up with PCP in 1 week for BP check after taking her meds.   Lurline Del, [place]

## 2021-10-26 LAB — CBC WITH DIFFERENTIAL/PLATELET
Basophils Absolute: 0 10*3/uL (ref 0.0–0.2)
Basos: 1 %
EOS (ABSOLUTE): 0 10*3/uL (ref 0.0–0.4)
Eos: 1 %
Hematocrit: 33.2 % — ABNORMAL LOW (ref 34.0–46.6)
Hemoglobin: 10.7 g/dL — ABNORMAL LOW (ref 11.1–15.9)
Immature Grans (Abs): 0 10*3/uL (ref 0.0–0.1)
Immature Granulocytes: 0 %
Lymphocytes Absolute: 2.7 10*3/uL (ref 0.7–3.1)
Lymphs: 43 %
MCH: 30.4 pg (ref 26.6–33.0)
MCHC: 32.2 g/dL (ref 31.5–35.7)
MCV: 94 fL (ref 79–97)
Monocytes Absolute: 0.4 10*3/uL (ref 0.1–0.9)
Monocytes: 6 %
Neutrophils Absolute: 3.1 10*3/uL (ref 1.4–7.0)
Neutrophils: 49 %
Platelets: 214 10*3/uL (ref 150–450)
RBC: 3.52 x10E6/uL — ABNORMAL LOW (ref 3.77–5.28)
RDW: 14.2 % (ref 11.7–15.4)
WBC: 6.2 10*3/uL (ref 3.4–10.8)

## 2021-10-26 LAB — COMPREHENSIVE METABOLIC PANEL
ALT: 7 IU/L (ref 0–32)
AST: 15 IU/L (ref 0–40)
Albumin/Globulin Ratio: 2.3 — ABNORMAL HIGH (ref 1.2–2.2)
Albumin: 4.5 g/dL (ref 3.8–4.8)
Alkaline Phosphatase: 105 IU/L (ref 44–121)
BUN/Creatinine Ratio: 10 — ABNORMAL LOW (ref 12–28)
BUN: 11 mg/dL (ref 8–27)
Bilirubin Total: 0.3 mg/dL (ref 0.0–1.2)
CO2: 25 mmol/L (ref 20–29)
Calcium: 9.4 mg/dL (ref 8.7–10.3)
Chloride: 108 mmol/L — ABNORMAL HIGH (ref 96–106)
Creatinine, Ser: 1.1 mg/dL — ABNORMAL HIGH (ref 0.57–1.00)
Globulin, Total: 2 g/dL (ref 1.5–4.5)
Glucose: 117 mg/dL — ABNORMAL HIGH (ref 70–99)
Potassium: 5.2 mmol/L (ref 3.5–5.2)
Sodium: 148 mmol/L — ABNORMAL HIGH (ref 134–144)
Total Protein: 6.5 g/dL (ref 6.0–8.5)
eGFR: 55 mL/min/{1.73_m2} — ABNORMAL LOW (ref >59)

## 2021-10-30 ENCOUNTER — Encounter: Payer: Self-pay | Admitting: Family Medicine

## 2021-11-02 ENCOUNTER — Other Ambulatory Visit: Payer: Self-pay | Admitting: Family Medicine

## 2021-11-02 DIAGNOSIS — R110 Nausea: Secondary | ICD-10-CM

## 2021-11-05 ENCOUNTER — Encounter: Payer: Self-pay | Admitting: Neurology

## 2021-11-05 ENCOUNTER — Ambulatory Visit (INDEPENDENT_AMBULATORY_CARE_PROVIDER_SITE_OTHER): Payer: 59 | Admitting: Neurology

## 2021-11-05 VITALS — BP 123/73 | HR 62 | Ht 66.0 in | Wt 120.0 lb

## 2021-11-05 DIAGNOSIS — I63211 Cerebral infarction due to unspecified occlusion or stenosis of right vertebral arteries: Secondary | ICD-10-CM | POA: Diagnosis not present

## 2021-11-05 DIAGNOSIS — G40309 Generalized idiopathic epilepsy and epileptic syndromes, not intractable, without status epilepticus: Secondary | ICD-10-CM

## 2021-11-05 DIAGNOSIS — I639 Cerebral infarction, unspecified: Secondary | ICD-10-CM

## 2021-11-05 NOTE — Patient Instructions (Signed)
Continue Keppra 500 mg twice daily  ?Follow up with your other doctors  ?Return in 1 year  ?

## 2021-11-05 NOTE — Progress Notes (Signed)
[facility]  PATIENT: [name] DOB: 09/17/1950  REFERRING CLINICIAN: Shary Key, DO HISTORY FROM: Patient  REASON FOR VISIT: New onset seizure   HISTORICAL  CHIEF COMPLAINT:  Chief Complaint  Patient presents with   Follow-up    Rm 12. Alone. Pt denies any new seizures. Remains on Keppra BID.   INTERVAL HISTORY 11/05/2021: Patient presents today for follow-up.  At last visit in September plan was to continue with Keppra 500 mg twice daily.  Keppra level has been checked, was 26.4.  She report compliance with medication, denies any side effect.  He did follow with his primary care doctor regarding the weight loss and is pending GI evaluation.  Patient also reports she is doing physical therapy.     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  Patient presented to the clinic by herself, reports that she does not remember the event.  Reports that the only thing that she remembers is waking up from the hospital.  She said that family had told her that she had a seizure at the house and in the hospital.  She said that her daughter had told her that she woke up in the middle of the night, screaming and then she fell and started foaming at the mouth.  She never had a seizure event in her life.  This was the first lifetime event. Seizure risk factor include a history of stroke and grand son with seizures.  In the hospital she was loaded with Keppra, she had a brain MRI which showed her previous stroke and EEG which was normal.  She was continued on Keppra 500 mg twice daily and discharged home.  Patient stated since discharge she has been doing well, denies any seizure or seizure-like activity.  She is compliant with the Keppra 500 mg twice daily and denies any side effect from the medication.  She currently  lives at home with her daughter and grandkids.  She does not drive and she is able to manage all daily activity.  She manages her defined her finances by her self.   Other issues brought today is her weight loss patient stated for the past 6 months she lost about 40 pounds and she is worried and concerned about that.  She has not followed up with her primary care doctor.     Handedness: Right handed   Seizure Type: Generalized convulsion  Current frequency: 1 seizure   Any injuries from seizures: No  Seizure risk factors: Grand daughter with seizure, strokes  Previous ASMs: None   Currenty ASMs: Levetiracetam   ASMs side effects: None   Brain Images: MRI Brain 04/10/2021 1.  No acute intracranial abnormality. 2. Small area of developing encephalomalacia with hemosiderin in the right superior occipital lobe corresponds to the recent CT finding. 3. Underlying chronic cerebellar infarcts with evidence of chronically poor flow or occlusion in the distal right vertebral artery. 4. Chronic cerebral white matter disease is stable since last year.  Previous EEGs: rEEG 04/10/2021: This study is within normal limits. No seizures or epileptiform discharges were seen throughout the recording.   OTHER MEDICAL CONDITIONS: Stroke, HTN, HLD, COPD  REVIEW OF SYSTEMS: Full 14 system review of systems performed and negative with exception of: as noted in the HPI  ALLERGIES: No Known Allergies  HOME MEDICATIONS: Outpatient Medications Prior to Visit  Medication Sig Dispense Refill   albuterol (VENTOLIN HFA) 108 (90 Base) MCG/ACT inhaler INHALE 2 PUFFS INTO THE LUNGS EVERY 4 HOURS AS NEEDED FOR WHEEZING OR SHORTNESS OF BREATH 18 g 1   amLODipine (NORVASC) 5 MG tablet TAKE 1 TABLET(5 MG) BY MOUTH DAILY 90 tablet 1   aspirin EC 81 MG tablet Take 1 tablet (81 mg total) by mouth daily. 90 tablet 3   atorvastatin (LIPITOR) 80 MG tablet Take 1 tablet (80 mg total) by mouth daily at 6 PM. 90 tablet 3    capsaicin (ZOSTRIX) 0.025 % cream Apply topically 2 (two) times daily. 60 g 0   feeding supplement (ENSURE ENLIVE / ENSURE PLUS) LIQD Take 237 mLs by mouth 3 (three) times daily between meals. 237 mL 12   Fluticasone-Umeclidin-Vilant (TRELEGY ELLIPTA) 100-62.5-25 MCG/INH AEPB Inhale 1 puff into the lungs daily. 60 each 1   levETIRAcetam (KEPPRA) 500 MG tablet Take 1 tablet (500 mg total) by mouth 2 (two) times daily. 60 tablet 12   metoprolol tartrate (LOPRESSOR) 25 MG tablet Take 1 tablet (25 mg total) by mouth 2 (two) times daily. 60 tablet 0   nystatin (MYCOSTATIN/NYSTOP) powder Apply 1 application topically 3 (three) times daily. 15 g 0   ondansetron (ZOFRAN) 4 MG tablet TAKE 1 TABLET(4 MG) BY MOUTH EVERY 8 HOURS AS NEEDED FOR NAUSEA OR VOMITING 20 tablet 0   pantoprazole (PROTONIX) 40 MG tablet TAKE 1 TABLET(40 MG) BY MOUTH DAILY 90 tablet 0   No facility-administered medications prior to visit.    PAST MEDICAL HISTORY: Past Medical History:  Diagnosis Date   Abdominal pain, epigastric 02/23/2013   Abdominal pain, unspecified site 08/15/2013   Abnormal neurological exam 04/11/2016   Acute CVA (cerebrovascular accident) ([place]) 11/03/2019   Acute non-recurrent frontal sinusitis 05/21/2018   Asthma    Cataracts, bilateral 05/03/2014   Cephalalgia    Chronic bronchitis (HCC)    Chronic lower back pain    COPD (chronic obstructive pulmonary disease) (HCC)    CVA (cerebral vascular accident) ([place]) 11/04/2019   Daily headache    De Quervain's tenosynovitis, left 11/09/2014   Essential hypertension 10/08/2021   GERD (gastroesophageal reflux disease)    Hx of colonic polyps    Maxillary sinusitis    Nausea & vomiting    Orthopnea 09/28/2015   Reports of 5 pillow orthopnea (from 2-3) which began with cold symptoms. Could be due to cold but would like to rule out cardiac etiology. Also noted of 1 year history of dyspnea on exertion which only worsens when she gets a cold; this could be due to COPD  however cardiac etiology should be considered. No JVD or LE edema on exam.  - ECHO ordered - will refer to cardiology if ECHO concerning for H   PSORIASIS 12/29/2006   Qualifier: Diagnosis of  By: Jobe Igo MD, David     Pure hypercholesterolemia 10/08/2021   Right leg pain 04/02/2016   Smoker    Vertebral artery occlusion, right 10/08/2021   Hx of Rt vertebral artery occlusion    PAST SURGICAL HISTORY: Past Surgical History:  Procedure Laterality Date   ABDOMINAL AORTOGRAM W/LOWER EXTREMITY N/A 10/06/2016   Procedure: Abdominal Aortogram w/Lower Extremity;  Surgeon: Waynetta Sandy, MD;  Location: [facility];  Service: Cardiovascular;  Laterality: N/A;   ABDOMINAL AORTOGRAM W/LOWER EXTREMITY N/A 02/10/2018   Procedure: ABDOMINAL AORTOGRAM W/LOWER EXTREMITY;  Surgeon: Waynetta Sandy, MD;  Location: [facility];  Service: Cardiovascular;  Laterality:  N/A;   ABDOMINAL HERNIA REPAIR     APPENDECTOMY     COLONOSCOPY W/ POLYPECTOMY  02/26/2005   FEMORAL-POPLITEAL BYPASS GRAFT Left 04/20/2018   Procedure: Left FEMORAL-POPLITEAL ARTERY Bypass Graft using Gore Propaten Vascular Graft;  Surgeon: Waynetta Sandy, MD;  Location: [facility];  Service: Vascular;  Laterality: Left;   HERNIA REPAIR     LOWER EXTREMITY ANGIOGRAPHY Bilateral 04/19/2018   Procedure: LOWER EXTREMITY ANGIOGRAPHY;  Surgeon: Waynetta Sandy, MD;  Location: [facility];  Service: Cardiovascular;  Laterality: Bilateral;   PERIPHERAL VASCULAR ATHERECTOMY Right 10/06/2016   Procedure: Peripheral Vascular Atherectomy;  Surgeon: Waynetta Sandy, MD;  Location: [facility];  Service: Cardiovascular;  Laterality: Right;   PERIPHERAL VASCULAR INTERVENTION Right 10/06/2016   Procedure: Peripheral Vascular Intervention;  Surgeon: Waynetta Sandy, MD;  Location: [facility];  Service: Cardiovascular;  Laterality: Right;  SFA   PERIPHERAL VASCULAR INTERVENTION Left  02/10/2018   Procedure: PERIPHERAL VASCULAR INTERVENTION;  Surgeon: Waynetta Sandy, MD;  Location: [facility];  Service: Cardiovascular;  Laterality: Left;   TONSILLECTOMY  1960s   UPPER GASTROINTESTINAL ENDOSCOPY  03/22/2010, 02/13/2005   VAGINAL HYSTERECTOMY     partial    FAMILY HISTORY: Family History  Problem Relation Age of Onset   Cancer Other    Coronary artery disease Other    Cancer Mother    Hypertension Mother    Heart disease Mother    Cancer Father    Hypertension Father    Cancer Sister    Hypertension Sister     SOCIAL HISTORY: Social History   Socioeconomic History   Marital status: Single    Spouse name: Not on file   Number of children: 2   Years of education: 12   Highest education level: High school graduate  Occupational History   Not on file  Tobacco Use   Smoking status: Every Day    Packs/day: 0.50    Years: 47.00    Pack years: 23.50    Types: Cigarettes   Smokeless tobacco: Never   Tobacco comments:    3-4 cigarettes per day  Vaping Use   Vaping Use: Never used  Substance and Sexual Activity   Alcohol use: Not Currently    Alcohol/week: 0.0 standard drinks    Comment: Quit alcohol in 1992; "never really drank much alcohol"   Drug use: Yes    Types: Marijuana    Comment: 02/10/2018 "~ twice//wk"   Sexual activity: Not Currently  Other Topics Concern   Not on file  Social History Narrative   Patient lives alone in [place].    Patient has two adult children nearby.    Patient has never been married.    Patient enjoys riding motorcycles and she is part of a "biker club."   Patient enjoys shopping and going to sporting events.   Social Determinants of Health   Financial Resource Strain: Not on file  Food Insecurity: Not on file  Transportation Needs: Not on file  Physical Activity: Not on file  Stress: Not on file  Social Connections: Not on file  Intimate Partner Violence: Not on file     PHYSICAL  EXAM  GENERAL EXAM/CONSTITUTIONAL: Vitals:  Vitals:   11/05/21 1425  BP: 123/73  Pulse: 62  Weight: 120 lb (54.4 kg)  Height: '5\' 6"'$  (1.676 m)   Body mass index is 19.37 kg/m. Wt Readings from Last 3 Encounters:  11/05/21 120 lb (54.4 kg)  10/25/21 124 lb 2 oz (56.3 kg)  10/08/21 115 lb 9.6 oz (52.4 kg)   Patient is in no distress; well developed, nourished and groomed; neck is supple  CARDIOVASCULAR: Examination of carotid arteries is normal; no carotid bruits Regular rate and rhythm, no murmurs Examination of peripheral vascular system by observation and palpation is normal  EYES: Pupils round and reactive to light, Visual fields full to confrontation, Extraocular movements intacts,  MUSCULOSKELETAL: Gait, strength, tone, movements noted in Neurologic exam below  NEUROLOGIC: MENTAL STATUS: awake, alert, oriented to person, place and time recent and remote memory intact normal attention and concentration language fluent, comprehension intact, naming intact fund of knowledge appropriate  CRANIAL NERVE:  2nd, 3rd, 4th, 6th - pupils equal and reactive to light, visual fields full to confrontation, extraocular muscles intact, no nystagmus 5th - facial sensation symmetric 7th - facial strength symmetric 8th - hearing intact 9th - palate elevates symmetrically, uvula midline 11th - shoulder shrug symmetric 12th - tongue protrusion midline  MOTOR:  normal bulk and tone, full strength in the BUE, BLE  SENSORY:  normal and symmetric to light touch, pinprick, temperature, vibration  COORDINATION:  finger-nose-finger, fine finger movements normal  REFLEXES:  deep tendon reflexes present and symmetric  GAIT/STATION:  normal    DIAGNOSTIC DATA (LABS, IMAGING, TESTING) - I reviewed patient records, labs, notes, testing and imaging myself where available.  Lab Results  Component Value Date   WBC 6.2 10/25/2021   HGB 10.7 (L) 10/25/2021   HCT 33.2 (L)  10/25/2021   MCV 94 10/25/2021   PLT 214 10/25/2021      Component Value Date/Time   NA 148 (H) 10/25/2021 1540   K 5.2 10/25/2021 1540   CL 108 (H) 10/25/2021 1540   CO2 25 10/25/2021 1540   GLUCOSE 117 (H) 10/25/2021 1540   GLUCOSE 94 10/09/2021 0507   BUN 11 10/25/2021 1540   CREATININE 1.10 (H) 10/25/2021 1540   CREATININE 0.79 08/15/2013 1446   CALCIUM 9.4 10/25/2021 1540   PROT 6.5 10/25/2021 1540   ALBUMIN 4.5 10/25/2021 1540   AST 15 10/25/2021 1540   ALT 7 10/25/2021 1540   ALKPHOS 105 10/25/2021 1540   BILITOT 0.3 10/25/2021 1540   GFRNONAA 47 (L) 10/09/2021 0507   GFRAA 74 09/12/2020 1456   Lab Results  Component Value Date   CHOL 114 10/08/2021   HDL 41 10/08/2021   LDLCALC NOT CALCULATED 10/08/2021   TRIG 51 10/08/2021   Lab Results  Component Value Date   HGBA1C 6.1 (H) 10/08/2021   Lab Results  Component Value Date   VITAMINB12 439 04/10/2021   Lab Results  Component Value Date   TSH 0.762 10/07/2021    Brain MRI 04/10/2021 1.  No acute intracranial abnormality. 2. Small area of developing encephalomalacia with hemosiderin in the right superior occipital lobe corresponds to the recent CT finding. 3. Underlying chronic cerebellar infarcts with evidence of chronically poor flow or occlusion in the distal right vertebral artery. 4. Chronic cerebral white matter disease is stable since last year.   Routine EEG 04/10/2021 This study is within normal limits. No seizures or epileptiform discharges were seen throughout the recording.  I personally reviewed brain Images and previous EEG reports.   ASSESSMENT AND PLAN  71 y.o. year old female  with past medical history of stroke secondary to right vertebral artery occlusion, hypertension, hyperlipidemia, COPD who is presenting for follow-up for her seizure disorder.  Since starting Keppra 500 mg twice  daily, she has not had any additional seizures.  She reports compliance with medication, denies any side  effect from the medication.  Last Keppra level was 26.4.  At this time, I will continue patient on Keppra 500 mg twice daily and I will see her in 1 year for follow-up.  Advised patient to contact me if she has another seizure.  She is comfortable with plan.  Follow-up with your doctor as as indicated.   1. Generalized idiopathic epilepsy and epileptic syndromes, not intractable, without status epilepticus ([place])   2. 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 statutes, patients with seizures are not allowed to drive until they have been seizure-free for six months.  Other recommendations include using caution when using heavy equipment or power tools. Avoid working on ladders or at heights. Take showers instead of baths.  Do not swim alone.  Ensure the water temperature is not too high on the home water heater. Do not go swimming alone. Do not lock yourself in a room alone (i.e. bathroom). When caring for infants or small children, sit down when holding, feeding, or changing them to minimize risk of injury to the child in the event you have a seizure. Maintain good sleep hygiene. Avoid alcohol.  Also recommend adequate sleep, hydration, good diet and minimize stress.   During the Seizure  - First, ensure adequate ventilation and place patients on the floor on their left side  Loosen clothing around the neck and ensure the airway is patent. If the patient is clenching the teeth, do not force the mouth open with any object as this can cause severe damage - Remove all items from the surrounding that can be hazardous. The patient may be oblivious to what's happening and may not even know what he or she is doing. If the patient is confused and wandering, either gently guide him/her away and  block access to outside areas - Reassure the individual and be comforting - Call 911. In most cases, the seizure ends before EMS arrives. However, there are cases when seizures may last over 3 to 5 minutes. Or the individual may have developed breathing difficulties or severe injuries. If a pregnant patient or a person with diabetes develops a seizure, it is prudent to call an ambulance. - Finally, if the patient does not regain full consciousness, then call EMS. Most patients will remain confused for about 45 to 90 minutes after a seizure, so you must use judgment in calling for help. - Avoid restraints but make sure the patient is in a bed with padded side rails - Place the individual in a lateral position with the neck slightly flexed; this will help the saliva drain from the mouth and prevent the tongue from falling backward - Remove all nearby furniture and other hazards from the area - Provide verbal assurance as the individual is regaining consciousness - Provide the patient with privacy if possible - Call for help and start treatment as ordered by the caregiver   After the Seizure (Postictal Stage)  After a seizure, most patients experience confusion, fatigue, muscle pain and/or a headache. Thus, one should permit the individual to sleep. For the next few days, reassurance is essential. Being calm and helping reorient the person  is also of importance.  Most seizures are painless and end spontaneously. Seizures are not harmful to others but can lead to complications such as stress on the lungs, brain and the heart. Individuals with prior lung problems may develop labored breathing and respiratory distress.     No orders of the defined types were placed in this encounter.   No orders of the defined types were placed in this encounter.   Return in about 1 year (around 11/06/2022).    Alric Ran, MD 11/05/2021, 10:15 PM  [facility] [address] [place], [place] [address] [phone]

## 2021-11-11 ENCOUNTER — Ambulatory Visit (INDEPENDENT_AMBULATORY_CARE_PROVIDER_SITE_OTHER): Payer: 59 | Admitting: Family Medicine

## 2021-11-11 ENCOUNTER — Other Ambulatory Visit (HOSPITAL_COMMUNITY): Payer: Self-pay

## 2021-11-11 ENCOUNTER — Other Ambulatory Visit: Payer: Self-pay

## 2021-11-11 ENCOUNTER — Encounter: Payer: Self-pay | Admitting: Family Medicine

## 2021-11-11 VITALS — BP 98/68 | HR 64 | Wt 120.0 lb

## 2021-11-11 DIAGNOSIS — R110 Nausea: Secondary | ICD-10-CM

## 2021-11-11 DIAGNOSIS — R634 Abnormal weight loss: Secondary | ICD-10-CM

## 2021-11-11 DIAGNOSIS — I10 Essential (primary) hypertension: Secondary | ICD-10-CM

## 2021-11-11 MED ORDER — ENSURE ENLIVE PO LIQD
237.0000 mL | Freq: Three times a day (TID) | ORAL | 12 refills | Status: DC
  Filled 2021-11-11: qty 237, 1d supply, fill #0

## 2021-11-11 MED ORDER — ENSURE ENLIVE PO LIQD: 237.0000 mL | Freq: Three times a day (TID) | ORAL | 12 refills | Status: DC

## 2021-11-11 NOTE — Patient Instructions (Addendum)
It was great seeing you today! ? ?I am sorry you still are still not feeling well.  As we discussed, it really important to stay hydrated.  Continue taking Zofran as needed for the nausea.  I have sent in for your protein supplements, to use in between your meals daily.   ? ?Please call this number to schedule an appointment: ?Address: [address] [place], [place], [place] [address] ?Phone: [phone] ? ?For sleep I also recommend avoiding TV at least 1 hour before bedtime, and trying reading instead.  You can also try melatonin over-the-counter. ? ?Please check-out at the front desk before leaving the clinic. I'd like to see you back in about a month after you have seen GI, so that we can follow-up and do your physical, but if you need to be seen earlier than that for any new issues we're happy to fit you in, just give [place] a call! ? ?  ?Feel free to call with any questions or concerns at any time, at [phone]. ?  ?Take care,  ?Dr. Shary Key ?[place]  ?

## 2021-11-11 NOTE — Progress Notes (Unsigned)
° ° °  SUBJECTIVE:   CHIEF COMPLAINT / HPI:   Patient was seen on 2/24 due to emesis and weight loss, was supposed to follow-up with GI in the past but did not go to appointment. Referral was placed again was advised to be seen Hgb 10.7, Cr. 1.10.   Has not followed up wit GI yet   States she takes all the medications on her paper  Did not take medications today because she didn't eat anyting   States for the past month does not have appetite. Has still been throwing up about 2-3x a week and denies throwing up. States Zofran helps. Denies diarrhea but does endorse constipation. States she always uses the restroom. Denies fever but states she is hot all the time. Does endorse occasional abdominal pain. Does endorse pain with eating  Denies syncopal episodes  Drinks 1 bottle of water a day.  States last couple of months as not been able to sleep- has a hard time falling a sleep   States she was told she had seizures and a stroke   PERTINENT  PMH / PSH: *** COPD, GERD,  OBJECTIVE:   BP 98/68    Pulse 64    Wt 120 lb (54.4 kg)    SpO2 100%    BMI 19.37 kg/m    Physical exam General: well appearing, NAD Cardiovascular: RRR, no murmurs Lungs: CTAB. Normal WOB Abdomen: soft, non-distended, non-tender Skin: warm, dry. No edema   ASSESSMENT/PLAN:   No problem-specific Assessment & Plan notes found for this encounter.   Advised to drink more water  Will order ensures- advised to drink between meals   Medication management  HTN Takes amlodipine '5mg'$    Shary Key, [place]

## 2021-11-27 ENCOUNTER — Other Ambulatory Visit: Payer: Self-pay | Admitting: Family Medicine

## 2021-11-27 DIAGNOSIS — R110 Nausea: Secondary | ICD-10-CM

## 2021-12-16 ENCOUNTER — Other Ambulatory Visit: Payer: Self-pay | Admitting: Family Medicine

## 2021-12-16 DIAGNOSIS — J449 Chronic obstructive pulmonary disease, unspecified: Secondary | ICD-10-CM

## 2021-12-22 ENCOUNTER — Emergency Department (HOSPITAL_COMMUNITY): Payer: 59

## 2021-12-22 ENCOUNTER — Other Ambulatory Visit: Payer: Self-pay

## 2021-12-22 ENCOUNTER — Encounter (HOSPITAL_COMMUNITY): Payer: Self-pay | Admitting: Emergency Medicine

## 2021-12-22 ENCOUNTER — Emergency Department (HOSPITAL_COMMUNITY)
Admission: EM | Admit: 2021-12-22 | Discharge: 2021-12-22 | Disposition: A | Discharge status: Home / Self Care | Payer: 59 | Attending: Emergency Medicine | Admitting: Emergency Medicine

## 2021-12-22 DIAGNOSIS — R569 Unspecified convulsions: Secondary | ICD-10-CM | POA: Diagnosis present

## 2021-12-22 DIAGNOSIS — Z20822 Contact with and (suspected) exposure to covid-19: Secondary | ICD-10-CM | POA: Diagnosis not present

## 2021-12-22 DIAGNOSIS — G40909 Epilepsy, unspecified, not intractable, without status epilepticus: Secondary | ICD-10-CM | POA: Diagnosis not present

## 2021-12-22 DIAGNOSIS — Z7982 Long term (current) use of aspirin: Secondary | ICD-10-CM | POA: Diagnosis not present

## 2021-12-22 LAB — RAPID URINE DRUG SCREEN, HOSP PERFORMED
Amphetamines: NOT DETECTED
Barbiturates: NOT DETECTED
Benzodiazepines: NOT DETECTED
Cocaine: NOT DETECTED
Opiates: NOT DETECTED
Tetrahydrocannabinol: POSITIVE — AB

## 2021-12-22 LAB — CBC WITH DIFFERENTIAL/PLATELET
Abs Immature Granulocytes: 0.02 10*3/uL (ref 0.00–0.07)
Basophils Absolute: 0 10*3/uL (ref 0.0–0.1)
Basophils Relative: 0 %
Eosinophils Absolute: 0 10*3/uL (ref 0.0–0.5)
Eosinophils Relative: 0 %
HCT: 36.4 % (ref 36.0–46.0)
Hemoglobin: 11.5 g/dL — ABNORMAL LOW (ref 12.0–15.0)
Immature Granulocytes: 0 %
Lymphocytes Relative: 23 %
Lymphs Abs: 1.8 10*3/uL (ref 0.7–4.0)
MCH: 31 pg (ref 26.0–34.0)
MCHC: 31.6 g/dL (ref 30.0–36.0)
MCV: 98.1 fL (ref 80.0–100.0)
Monocytes Absolute: 0.4 10*3/uL (ref 0.1–1.0)
Monocytes Relative: 5 %
Neutro Abs: 5.5 10*3/uL (ref 1.7–7.7)
Neutrophils Relative %: 72 %
Platelets: 245 10*3/uL (ref 150–400)
RBC: 3.71 MIL/uL — ABNORMAL LOW (ref 3.87–5.11)
RDW: 14.6 % (ref 11.5–15.5)
WBC: 7.8 10*3/uL (ref 4.0–10.5)
nRBC: 0 % (ref 0.0–0.2)

## 2021-12-22 LAB — URINALYSIS, ROUTINE W REFLEX MICROSCOPIC
Bilirubin Urine: NEGATIVE
Glucose, UA: NEGATIVE mg/dL
Hgb urine dipstick: NEGATIVE
Ketones, ur: NEGATIVE mg/dL
Leukocytes,Ua: NEGATIVE
Nitrite: NEGATIVE
Protein, ur: NEGATIVE mg/dL
Specific Gravity, Urine: 1.015 (ref 1.005–1.030)
pH: 6 (ref 5.0–8.0)

## 2021-12-22 LAB — RESP PANEL BY RT-PCR (FLU A&B, COVID) ARPGX2
Influenza A by PCR: NEGATIVE
Influenza B by PCR: NEGATIVE
SARS Coronavirus 2 by RT PCR: NEGATIVE

## 2021-12-22 LAB — COMPREHENSIVE METABOLIC PANEL
ALT: 11 U/L (ref 0–44)
AST: 20 U/L (ref 15–41)
Albumin: 3.9 g/dL (ref 3.5–5.0)
Alkaline Phosphatase: 84 U/L (ref 38–126)
Anion gap: 10 (ref 5–15)
BUN: 13 mg/dL (ref 8–23)
CO2: 22 mmol/L (ref 22–32)
Calcium: 9.1 mg/dL (ref 8.9–10.3)
Chloride: 107 mmol/L (ref 98–111)
Creatinine, Ser: 1.05 mg/dL — ABNORMAL HIGH (ref 0.44–1.00)
GFR, Estimated: 58 mL/min — ABNORMAL LOW (ref >60)
Glucose, Bld: 116 mg/dL — ABNORMAL HIGH (ref 70–99)
Potassium: 4 mmol/L (ref 3.5–5.1)
Sodium: 139 mmol/L (ref 135–145)
Total Bilirubin: 0.2 mg/dL — ABNORMAL LOW (ref 0.3–1.2)
Total Protein: 6.5 g/dL (ref 6.5–8.1)

## 2021-12-22 LAB — CK: Total CK: 99 U/L (ref 38–234)

## 2021-12-22 LAB — AMMONIA: Ammonia: 20 umol/L (ref 9–35)

## 2021-12-22 LAB — ETHANOL: Alcohol, Ethyl (B): <10 mg/dL (ref <10)

## 2021-12-22 LAB — MAGNESIUM: Magnesium: 2 mg/dL (ref 1.7–2.4)

## 2021-12-22 MED ORDER — LEVETIRACETAM IN NACL 1000 MG/100ML IV SOLN
1000.0000 mg | Freq: Once | INTRAVENOUS | Status: AC
  Administered 2021-12-22: 1000 mg via INTRAVENOUS
  Filled 2021-12-22: qty 100

## 2021-12-22 MED ORDER — LEVETIRACETAM 750 MG PO TABS
750.0000 mg | ORAL_TABLET | Freq: Two times a day (BID) | ORAL | 12 refills | Status: DC
  Filled 2021-12-22: qty 60, 30d supply, fill #0

## 2021-12-22 MED ORDER — ONDANSETRON HCL 4 MG/2ML IJ SOLN
4.0000 mg | Freq: Once | INTRAMUSCULAR | Status: AC
Start: 2021-12-22 — End: 2021-12-22
  Administered 2021-12-22: 4 mg via INTRAVENOUS
  Filled 2021-12-22: qty 2

## 2021-12-22 MED ORDER — KETOROLAC TROMETHAMINE 30 MG/ML IJ SOLN
30.0000 mg | Freq: Once | INTRAMUSCULAR | Status: AC
Start: 2021-12-22 — End: 2021-12-22
  Administered 2021-12-22: 30 mg via INTRAVENOUS
  Filled 2021-12-22: qty 1

## 2021-12-22 MED ORDER — LEVETIRACETAM 750 MG PO TABS: 750.0000 mg | ORAL_TABLET | Freq: Two times a day (BID) | ORAL | 12 refills | Status: DC

## 2021-12-22 NOTE — ED Notes (Signed)
Pt ambulatory to [facility].  Pt was unable to obtain urine sample at this time as she also had BM which mixed with urine.  Will attempt again. ?

## 2021-12-22 NOTE — Discharge Instructions (Addendum)
You should not drive for 6 months or until cleared by a neurologist with your history of seizures. ? ?Please contact your neurologist office.  We are increasing your Keppra dose form 500 mg twice daily to 750 mg twice daily.  A new prescription was sent to the pharmacy listed.  Your next dose is due tonight. ?

## 2021-12-22 NOTE — ED Triage Notes (Signed)
Pt arrives via EMS from home with unwitnessed seizure. EMS got to pt at 806 and pt was postictal. Pt was found on the ground by granddaughter. Hx of seizures. Pt alert, unaware of event.  ?Granddaughter Loma Sousa [phone] ?

## 2021-12-22 NOTE — ED Provider Notes (Signed)
?[facility] ?Provider Note ? ? ?CSN: [id number] ?Arrival date & time: 12/22/21  0841 ? ?  ? ?History ? ?Chief Complaint  ?Patient presents with  ? Seizures  ? ? ?[name] is a 71 y.o. female with a prior history of seizures on Keppra presenting from home with concern for a possible seizure.  EMS was called onto the scene by the patient's granddaughter came to check on the patient this morning, and found her on the ground confused.  EMS reports the patient appeared to be postictal on their arrival, and urinated on herself.  On arrival in the hospital the patient is awake and talking, cannot recall going to bed last night or anything that happened this morning.  She does live by herself.  She says her last seizure was "a few months ago".  She says she felt well this week, denies any fevers, chills.  She denies any drug or alcohol use. ? ?Per my review of external records, she was last seen by a neurologist Dr Don Perking on 11/05/2021, one month ago, nothing a normal EEG in Aug 2022, MRI brain 04/10/21 with small area of encephalomalacia in right occipital lobe, chronic cerebellar infarcts, and felt the patient was otherwise stable on her Keppra 500 mg BID medication, advised for 1 year follow up. ? ? ? ?HPI ? ?  ? ?Home Medications ?Prior to Admission medications   ?Medication Sig Start Date End Date Taking? Authorizing Provider  ?albuterol (VENTOLIN HFA) 108 (90 Base) MCG/ACT inhaler INHALE 2 PUFFS INTO THE LUNGS EVERY 4 HOURS AS NEEDED FOR WHEEZING OR SHORTNESS OF BREATH 05/07/21   Alric Ran, MD  ?amLODipine (NORVASC) 5 MG tablet TAKE 1 TABLET(5 MG) BY MOUTH DAILY 10/07/21   Shary Key, DO  ?aspirin EC 81 MG tablet Take 1 tablet (81 mg total) by mouth daily. 11/08/19   Kathrene Alu, MD  ?atorvastatin (LIPITOR) 80 MG tablet Take 1 tablet (80 mg total) by mouth daily at 6 PM. 04/10/21   Sharion Settler, DO  ?capsaicin (ZOSTRIX) 0.025 % cream Apply topically 2 (two)  times daily. 10/09/21   Gerrit Heck, MD  ?feeding supplement (ENSURE ENLIVE / ENSURE PLUS) LIQD Take 237 mLs by mouth 3 (three) times daily between meals. 11/11/21   Shary Key, DO  ?levETIRAcetam (KEPPRA) 750 MG tablet Take 1 tablet (750 mg total) by mouth 2 (two) times daily. 12/22/21 01/21/22  Wyvonnia Dusky, MD  ?metoprolol tartrate (LOPRESSOR) 25 MG tablet Take 1 tablet (25 mg total) by mouth 2 (two) times daily. 10/09/21   Gerrit Heck, MD  ?nystatin (MYCOSTATIN/NYSTOP) powder Apply 1 application topically 3 (three) times daily. 04/19/21   Ezequiel Essex, MD  ?ondansetron (ZOFRAN) 4 MG tablet TAKE 1 TABLET(4 MG) BY MOUTH EVERY 8 HOURS AS NEEDED FOR NAUSEA OR VOMITING 11/28/21   Arby Barrette, Weldon Picking, DO  ?pantoprazole (PROTONIX) 40 MG tablet TAKE 1 TABLET(40 MG) BY MOUTH DAILY 10/09/21   Shary Key, DO  ?TRELEGY ELLIPTA 100-62.5-25 MCG/ACT AEPB INHALE 1 PUFF INTO THE LUNGS DAILY 12/16/21   Shary Key, DO  ?   ? ?Allergies    ?Patient has no known allergies.   ? ?Review of Systems   ?Review of Systems ? ?Physical Exam ?Updated Vital Signs ?BP (!) 149/70   Pulse 74   Temp 98.2 ?F (36.8 ?C) (Oral)   Resp 15   Ht 5' 6.5" (1.689 m)   Wt 68 kg   SpO2 99%  BMI 23.85 kg/m?  ?Physical Exam ?Constitutional:   ?   General: She is not in acute distress. ?HENT:  ?   Head: Normocephalic and atraumatic.  ?Eyes:  ?   Conjunctiva/sclera: Conjunctivae normal.  ?   Pupils: Pupils are equal, round, and reactive to light.  ?Cardiovascular:  ?   Rate and Rhythm: Normal rate and regular rhythm.  ?Pulmonary:  ?   Effort: Pulmonary effort is normal. No respiratory distress.  ?Abdominal:  ?   General: There is no distension.  ?   Tenderness: There is no abdominal tenderness.  ?Skin: ?   General: Skin is warm and dry.  ?Neurological:  ?   General: No focal deficit present.  ?   Mental Status: She is alert and oriented to person, place, and time. Mental status is at baseline.  ?   Sensory: No sensory deficit.  ?    Motor: No weakness.  ?Psychiatric:     ?   Mood and Affect: Mood normal.     ?   Behavior: Behavior normal.  ? ? ?ED Results / Procedures / Treatments   ?Labs ?(all labs ordered are listed, but only abnormal results are displayed) ?Labs Reviewed  ?COMPREHENSIVE METABOLIC PANEL - Abnormal; Notable for the following components:  ?    Result Value  ? Glucose, Bld 116 (*)   ? Creatinine, Ser 1.05 (*)   ? Total Bilirubin 0.2 (*)   ? GFR, Estimated 58 (*)   ? All other components within normal limits  ?CBC WITH DIFFERENTIAL/PLATELET - Abnormal; Notable for the following components:  ? RBC 3.71 (*)   ? Hemoglobin 11.5 (*)   ? All other components within normal limits  ?RAPID URINE DRUG SCREEN, HOSP PERFORMED - Abnormal; Notable for the following components:  ? Tetrahydrocannabinol POSITIVE (*)   ? All other components within normal limits  ?RESP PANEL BY RT-PCR (FLU A&B, COVID) ARPGX2  ?MAGNESIUM  ?AMMONIA  ?URINALYSIS, ROUTINE W REFLEX MICROSCOPIC  ?ETHANOL  ?CK  ? ? ?EKG ?EKG Interpretation ? ?Date/Time:  Sunday December 22 2021 09:16:03 EDT ?Ventricular Rate:  84 ?PR Interval:  146 ?QRS Duration: 97 ?QT Interval:  367 ?QTC Calculation: 434 ?R Axis:   80 ?Text Interpretation: Sinus rhythm RSR' in V1 or V2, right VCD or RVH Confirmed by Octaviano Glow [phone]) on 12/22/2021 9:22:42 AM ? ?Radiology ?CT HEAD WO CONTRAST (5MM) ? ?Result Date: 12/22/2021 ?CLINICAL DATA:  71 year old female with seizure. EXAM: CT HEAD WITHOUT CONTRAST TECHNIQUE: Contiguous axial images were obtained from the base of the skull through the vertex without intravenous contrast. RADIATION DOSE REDUCTION: This exam was performed according to the departmental dose-optimization program which includes automated exposure control, adjustment of the mA and/or kV according to patient size and/or use of iterative reconstruction technique. COMPARISON:  04/09/2021 CT, 04/10/2021 MR and prior studies FINDINGS: Brain: No evidence of acute infarction, hemorrhage,  hydrocephalus, extra-axial collection or mass lesion/mass effect. Chronic small-vessel white matter ischemic changes and remote bilateral cerebellar and posterior RIGHT parietal infarcts again noted. Vascular: Carotid atherosclerotic calcifications are noted. Skull: Normal. Negative for fracture or focal lesion. Sinuses/Orbits: No acute finding. Other: None IMPRESSION: 1. No evidence of acute intracranial abnormality. 2. Chronic small-vessel white matter ischemic changes and remote infarcts as described. Electronically Signed   By: Margarette Canada M.D.   On: 12/22/2021 10:01  ? ?DG Chest Portable 1 View ? ?Result Date: 12/22/2021 ?CLINICAL DATA:  Seizure. EXAM: PORTABLE CHEST 1 VIEW COMPARISON:  10/07/2021 and prior studies  FINDINGS: The cardiomediastinal silhouette is unremarkable. There is no evidence of focal airspace disease, pulmonary edema, suspicious pulmonary nodule/mass, pleural effusion, or pneumothorax. No acute bony abnormalities are identified. IMPRESSION: No active disease. Electronically Signed   By: Margarette Canada M.D.   On: 12/22/2021 09:55   ? ?Procedures ?Procedures  ? ? ?Medications Ordered in ED ?Medications  ?levETIRAcetam (KEPPRA) IVPB 1000 mg/100 mL premix (0 mg Intravenous Stopped 12/22/21 1018)  ?ondansetron (ZOFRAN) injection 4 mg (4 mg Intravenous Given 12/22/21 1151)  ?ketorolac (TORADOL) 30 MG/ML injection 30 mg (30 mg Intravenous Given 12/22/21 1350)  ? ? ?ED Course/ Medical Decision Making/ A&P ?Clinical Course as of 12/23/21 0715  ?Sun Dec 22, 2021  ?1337 Patient reassessed and appears to doing well, back to baseline mental status.  Daughter is now present at the bedside.  Patient is complaining of a mild diffuse headache, I have ordered some IV Toradol.  I do not see evidence of acute intracranial injury or stroke on her CT scan.  We are awaiting a UA to complete her infectious work-up.  I explained that I would be increasing her Keppra dosing to 750 mg twice daily and having her follow-up  again with a neurologist. [MT]  ?  ?Clinical Course User Index ?[MT] Wyvonnia Dusky, MD  ? ?                        ?Medical Decision Making ?Amount and/or Complexity of Data Reviewed ?Labs: ordered. ?Radiology

## 2021-12-23 ENCOUNTER — Other Ambulatory Visit (HOSPITAL_COMMUNITY): Payer: Self-pay

## 2021-12-24 ENCOUNTER — Other Ambulatory Visit: Payer: Self-pay

## 2022-01-01 ENCOUNTER — Other Ambulatory Visit: Payer: Self-pay | Admitting: Family Medicine

## 2022-01-01 DIAGNOSIS — R110 Nausea: Secondary | ICD-10-CM

## 2022-01-21 ENCOUNTER — Other Ambulatory Visit: Payer: Self-pay | Admitting: Family Medicine

## 2022-01-21 ENCOUNTER — Other Ambulatory Visit: Payer: Self-pay | Admitting: Neurology

## 2022-01-21 DIAGNOSIS — J449 Chronic obstructive pulmonary disease, unspecified: Secondary | ICD-10-CM

## 2022-02-04 ENCOUNTER — Encounter: Payer: Self-pay | Admitting: Family Medicine

## 2022-02-15 ENCOUNTER — Other Ambulatory Visit: Payer: Self-pay | Admitting: Family Medicine

## 2022-02-15 DIAGNOSIS — R110 Nausea: Secondary | ICD-10-CM

## 2022-03-19 ENCOUNTER — Other Ambulatory Visit: Payer: Self-pay | Admitting: Family Medicine

## 2022-03-19 DIAGNOSIS — R110 Nausea: Secondary | ICD-10-CM

## 2022-03-31 ENCOUNTER — Ambulatory Visit: Payer: 59 | Admitting: Family Medicine

## 2022-04-07 ENCOUNTER — Other Ambulatory Visit: Payer: Self-pay | Admitting: *Deleted

## 2022-04-07 MED ORDER — ATORVASTATIN CALCIUM 80 MG PO TABS: 80.0000 mg | ORAL_TABLET | Freq: Every day | ORAL | 3 refills | Status: DC

## 2022-04-17 ENCOUNTER — Other Ambulatory Visit: Payer: Self-pay | Admitting: Family Medicine

## 2022-04-17 DIAGNOSIS — R110 Nausea: Secondary | ICD-10-CM

## 2022-05-23 ENCOUNTER — Other Ambulatory Visit: Payer: Self-pay | Admitting: Family Medicine

## 2022-05-23 DIAGNOSIS — R110 Nausea: Secondary | ICD-10-CM

## 2022-07-03 ENCOUNTER — Other Ambulatory Visit: Payer: Self-pay | Admitting: Family Medicine

## 2022-07-03 DIAGNOSIS — R110 Nausea: Secondary | ICD-10-CM

## 2022-07-06 ENCOUNTER — Encounter (HOSPITAL_COMMUNITY): Payer: Self-pay

## 2022-07-06 ENCOUNTER — Ambulatory Visit (HOSPITAL_COMMUNITY)
Admission: EM | Admit: 2022-07-06 | Discharge: 2022-07-06 | Disposition: A | Discharge status: Home / Self Care | Payer: 59 | Attending: Emergency Medicine | Admitting: Emergency Medicine

## 2022-07-06 DIAGNOSIS — L600 Ingrowing nail: Secondary | ICD-10-CM

## 2022-07-06 MED ORDER — TRAMADOL HCL 50 MG PO TABS: 50.0000 mg | ORAL_TABLET | Freq: Every evening | ORAL | 0 refills | Status: DC | PRN

## 2022-07-06 MED ORDER — DOXYCYCLINE HYCLATE 100 MG PO CAPS: 100.0000 mg | ORAL_CAPSULE | Freq: Two times a day (BID) | ORAL | 0 refills | Status: DC

## 2022-07-06 NOTE — ED Provider Notes (Signed)
[facility]    CSN: [id number] Arrival date & time: 07/06/22  1041      History   Chief Complaint Chief Complaint  Patient presents with   Foot Pain    HPI [name] is a 71 y.o. female.  Patient presents complaining of right greater toe pain that started 4 days ago.  Patient reports onset of symptoms began after attempting to clip her toenails. Patient reports drainage from site. Patient pain is 9 out of 10 at site. Patient denies any history of toe or foot problems. Patient denies any history of diabetes. Patient reports using Tylenol with no relief of symptoms.    Foot Pain    Past Medical History:  Diagnosis Date   Abdominal pain, epigastric 02/23/2013   Abdominal pain, unspecified site 08/15/2013   Abnormal neurological exam 04/11/2016   Acute CVA (cerebrovascular accident) ([place]) 11/03/2019   Acute non-recurrent frontal sinusitis 05/21/2018   Asthma    Cataracts, bilateral 05/03/2014   Cephalalgia    Chronic bronchitis (HCC)    Chronic lower back pain    COPD (chronic obstructive pulmonary disease) (HCC)    CVA (cerebral vascular accident) ([place]) 11/04/2019   Daily headache    De Quervain's tenosynovitis, left 11/09/2014   Essential hypertension 10/08/2021   GERD (gastroesophageal reflux disease)    Hx of colonic polyps    Maxillary sinusitis    Nausea & vomiting    Orthopnea 09/28/2015   Reports of 5 pillow orthopnea (from 2-3) which began with cold symptoms. Could be due to cold but would like to rule out cardiac etiology. Also noted of 1 year history of dyspnea on exertion which only worsens when she gets a cold; this could be due to COPD however cardiac etiology should be considered. No JVD or LE edema on exam.  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   Occlusion of common femoral artery (HCC)    Weight loss    Lumbar disc herniation    Hypertensive retinopathy of right eye, grade 1 05/03/2014   Hypertensive retinopathy of left eye, grade 2 05/03/2014   Chronic abdominal pain 08/15/2013   Dyspnea 08/10/2013   Protein calorie malnutrition ([place]) 05/11/2013   Chronic vomiting 07/16/2012   Tobacco abuse    GASTROESOPHAGEAL REFLUX DISEASE 12/29/2006   PSORIASIS 12/29/2006    Past Surgical History:  Procedure Laterality Date   ABDOMINAL AORTOGRAM W/LOWER EXTREMITY N/A 10/06/2016   Procedure: Abdominal Aortogram w/Lower Extremity;  Surgeon: Waynetta Sandy, MD;  Location: [facility];  Service: Cardiovascular;  Laterality: N/A;  ABDOMINAL AORTOGRAM W/LOWER EXTREMITY N/A 02/10/2018   Procedure: ABDOMINAL AORTOGRAM W/LOWER EXTREMITY;  Surgeon: Waynetta Sandy, MD;  Location: [facility];  Service: Cardiovascular;  Laterality: N/A;   ABDOMINAL HERNIA REPAIR     APPENDECTOMY     COLONOSCOPY W/ POLYPECTOMY  02/26/2005   FEMORAL-POPLITEAL BYPASS GRAFT Left 04/20/2018   Procedure: Left FEMORAL-POPLITEAL ARTERY Bypass Graft using Gore Propaten Vascular Graft;  Surgeon: Waynetta Sandy, MD;  Location: [facility];  Service:  Vascular;  Laterality: Left;   HERNIA REPAIR     LOWER EXTREMITY ANGIOGRAPHY Bilateral 04/19/2018   Procedure: LOWER EXTREMITY ANGIOGRAPHY;  Surgeon: Waynetta Sandy, MD;  Location: [facility];  Service: Cardiovascular;  Laterality: Bilateral;   PERIPHERAL VASCULAR ATHERECTOMY Right 10/06/2016   Procedure: Peripheral Vascular Atherectomy;  Surgeon: Waynetta Sandy, MD;  Location: [facility];  Service: Cardiovascular;  Laterality: Right;   PERIPHERAL VASCULAR INTERVENTION Right 10/06/2016   Procedure: Peripheral Vascular Intervention;  Surgeon: Waynetta Sandy, MD;  Location: [facility];  Service: Cardiovascular;  Laterality: Right;  SFA   PERIPHERAL VASCULAR INTERVENTION Left 02/10/2018   Procedure: PERIPHERAL VASCULAR INTERVENTION;  Surgeon: Waynetta Sandy, MD;  Location: [facility];  Service: Cardiovascular;  Laterality: Left;   TONSILLECTOMY  1960s   UPPER GASTROINTESTINAL ENDOSCOPY  03/22/2010, 02/13/2005   VAGINAL HYSTERECTOMY     partial    OB History   No obstetric history on file.      Home Medications    Prior to Admission medications   Medication Sig Start Date End Date Taking? Authorizing Provider  doxycycline (VIBRAMYCIN) 100 MG capsule Take 1 capsule (100 mg total) by mouth 2 (two) times daily. 07/06/22  Yes Flossie Dibble, NP  traMADol (ULTRAM) 50 MG tablet Take 1 tablet (50 mg total) by mouth at bedtime as needed (pain). 07/06/22  Yes Barrett Henle, MD  albuterol (VENTOLIN HFA) 108 (90 Base) MCG/ACT inhaler INHALE 2 PUFFS INTO THE LUNGS EVERY 4 HOURS AS NEEDED FOR WHEEZING OR SHORTNESS OF BREATH 05/07/21   Alric Ran, MD  amLODipine (NORVASC) 5 MG tablet TAKE 1 TABLET(5 MG) BY MOUTH DAILY 01/21/22   Shary Key, DO  aspirin EC 81 MG tablet Take 1 tablet (81 mg total) by mouth daily. 11/08/19   Kathrene Alu, MD  atorvastatin (LIPITOR) 80 MG tablet Take 1 tablet (80 mg total) by mouth daily at  6 PM. 04/07/22   Zola Button, MD  capsaicin (ZOSTRIX) 0.025 % cream Apply topically 2 (two) times daily. 10/09/21   Gerrit Heck, MD  feeding supplement (ENSURE ENLIVE / ENSURE PLUS) LIQD Take 237 mLs by mouth 3 (three) times daily between meals. 11/11/21   Shary Key, DO  levETIRAcetam (KEPPRA) 750 MG tablet Take 1 tablet (750 mg total) by mouth 2 (two) times daily. 12/22/21 01/21/22  Wyvonnia Dusky, MD  metoprolol tartrate (LOPRESSOR) 25 MG tablet Take 1 tablet (25 mg total) by mouth 2 (two) times daily. 10/09/21   Gerrit Heck, MD  nystatin (MYCOSTATIN/NYSTOP) powder Apply 1 application topically 3 (three) times daily. 04/19/21   Ezequiel Essex, MD  ondansetron (ZOFRAN) 4 MG tablet TAKE 1 TABLET(4 MG) BY MOUTH EVERY 8 HOURS AS NEEDED FOR NAUSEA OR VOMITING 07/03/22   Arby Barrette, [place] J, DO  pantoprazole (PROTONIX) 40 MG tablet TAKE 1 TABLET(40 MG) BY MOUTH DAILY 10/09/21   Paige, [place] J, DO  TRELEGY ELLIPTA 100-62.5-25 MCG/ACT AEPB INHALE 1 PUFF INTO THE  LUNGS DAILY 12/16/21   Shary Key, DO    Family History Family History  Problem Relation Age of Onset   Cancer Other    Coronary artery disease Other    Cancer Mother    Hypertension Mother    Heart disease Mother    Cancer Father    Hypertension Father    Cancer Sister    Hypertension Sister     Social History Social History   Tobacco Use   Smoking status: Every Day    Packs/day: 0.50    Years: 47.00    Total pack years: 23.50    Types: Cigarettes   Smokeless tobacco: Never   Tobacco comments:    3-4 cigarettes per day  Vaping Use   Vaping Use: Never used  Substance Use Topics   Alcohol use: Not Currently    Alcohol/week: 0.0 standard drinks of alcohol    Comment: Quit alcohol in 1992; "never really drank much alcohol"   Drug use: Yes    Types: Marijuana    Comment: 02/10/2018 "~ twice//wk"     Allergies   Patient has no known allergies.   Review of Systems Review of Systems  Constitutional:   Negative for activity change, chills and fever.  Skin:        RT greater toe pain.  Reports drainage from greater toe.   Neurological:  Negative for numbness.     Physical Exam Triage Vital Signs ED Triage Vitals  Enc Vitals Group     BP 07/06/22 1159 122/75     Pulse Rate 07/06/22 1159 73     Resp 07/06/22 1159 20     Temp 07/06/22 1159 (!) 97.5 F (36.4 C)     Temp src --      SpO2 07/06/22 1159 95 %     Weight --      Height --      Head Circumference --      Peak Flow --      Pain Score 07/06/22 1156 9     Pain Loc --      Pain Edu? --      Excl. in [place]? --    No data found.  Updated Vital Signs BP 122/75 (BP Location: Left Arm)   Pulse 73   Temp (!) 97.5 F (36.4 C)   Resp 20   SpO2 95%     Physical Exam Vitals and nursing note reviewed.  Constitutional:      Appearance: Normal appearance.  Cardiovascular:     Pulses:          Dorsalis pedis pulses are 2+ on the right side.  Musculoskeletal:     Right lower leg: No edema.  Skin:    General: Skin is warm.     Capillary Refill: Capillary refill takes less than 2 seconds.     Findings: Erythema present.     Comments: Swelling and erythema present to skin around distal groove of RT greater toe. Purulent drainage noted at site. Hardening to RT greater toe nail is present.  Tenderness upon palpation of site.  Normal movement of RT greater toe.   Neurological:     Mental Status: She is alert.      UC Treatments / Results  Labs (all labs ordered are listed, but only abnormal results are displayed) Labs Reviewed - No data to display  EKG   Radiology No results found.  Procedures Procedures (including critical care time)  Medications Ordered in UC  Medications - No data to display  Initial Impression / Assessment and Plan / UC Course  I have reviewed the triage vital signs and the nursing notes.  Pertinent labs & imaging results that were available during my care of the patient were reviewed by  me and considered in my medical decision making (see chart for details).     Patient was treated for ingrowing nail of right greater toe with infection.  Doxycycline was prescribed, she was made aware of possible side effects and treatment regiment.  Patient wanted medication for pain management, NSAID was not prescribed due to patient's previous kidney function lab values in EPIC. Tramadol was prescribed for severe pain due to infection, patient was only given 5 tablets for nighttime use.  Medication was prescribed by another provider due to this writer having difficulties with EPIC and app.  Patient was made aware that she will need to schedule an appointment with podiatry.  Due to extent of nail hardening, this writer determine patient will need to see podiatry for further evaluation. Patient was given information for podiatry.  Patient was made aware that she can also contact her PCP if needing a referral.  Patient was made aware to keep the site clean.  Patient verbalized understanding of instructions.  Final Clinical Impressions(s) / UC Diagnoses   Final diagnoses:  Ingrowing nail with infection     Discharge Instructions      Doxycycline has been sent to the pharmacy, this medication 2 times daily for the next 7 days.   Tramadol has been sent to the pharmacy, you can take 1 tablet at night to help with pain management.  This medication will be used before bedtime for the next 5 days until you can see the podiatrist.   These call and schedule an appointment with the podiatrist tomorrow morning, need to follow-up with them. Please try to keep the site clean.      ED Prescriptions     Medication Sig Dispense Auth. Provider   doxycycline (VIBRAMYCIN) 100 MG capsule Take 1 capsule (100 mg total) by mouth 2 (two) times daily. 14 capsule Flossie Dibble, NP   traMADol (ULTRAM) 50 MG tablet Take 1 tablet (50 mg total) by mouth at bedtime as needed (pain). 5 tablet [place], Gwenlyn Perking,  MD      I have reviewed the PDMP during this encounter.   Flossie Dibble, NP 07/06/22 1334

## 2022-07-06 NOTE — Discharge Instructions (Addendum)
Doxycycline has been sent to the pharmacy, this medication 2 times daily for the next 7 days.   Tramadol has been sent to the pharmacy, you can take 1 tablet at night to help with pain management.  This medication will be used before bedtime for the next 5 days until you can see the podiatrist.   These call and schedule an appointment with the podiatrist tomorrow morning, need to follow-up with them. Please try to keep the site clean.

## 2022-07-06 NOTE — ED Triage Notes (Signed)
Pt is here for right foot pain x 4days

## 2022-08-19 ENCOUNTER — Other Ambulatory Visit: Payer: Self-pay | Admitting: Family Medicine

## 2022-08-19 ENCOUNTER — Other Ambulatory Visit: Payer: Self-pay | Admitting: Neurology

## 2022-08-19 DIAGNOSIS — R110 Nausea: Secondary | ICD-10-CM

## 2022-08-19 DIAGNOSIS — J4489 Other specified chronic obstructive pulmonary disease: Secondary | ICD-10-CM

## 2022-08-25 ENCOUNTER — Emergency Department (HOSPITAL_COMMUNITY): Payer: 59

## 2022-08-25 ENCOUNTER — Emergency Department (HOSPITAL_COMMUNITY)
Admission: EM | Admit: 2022-08-25 | Discharge: 2022-08-26 | Discharge status: Against Medical Advice | Payer: 59 | Attending: Emergency Medicine | Admitting: Emergency Medicine

## 2022-08-25 ENCOUNTER — Encounter (HOSPITAL_COMMUNITY): Payer: Self-pay

## 2022-08-25 DIAGNOSIS — R110 Nausea: Secondary | ICD-10-CM | POA: Insufficient documentation

## 2022-08-25 DIAGNOSIS — Z5321 Procedure and treatment not carried out due to patient leaving prior to being seen by health care provider: Secondary | ICD-10-CM | POA: Diagnosis not present

## 2022-08-25 DIAGNOSIS — R6883 Chills (without fever): Secondary | ICD-10-CM | POA: Diagnosis not present

## 2022-08-25 DIAGNOSIS — J029 Acute pharyngitis, unspecified: Secondary | ICD-10-CM | POA: Diagnosis not present

## 2022-08-25 DIAGNOSIS — Z1152 Encounter for screening for COVID-19: Secondary | ICD-10-CM | POA: Diagnosis not present

## 2022-08-25 DIAGNOSIS — R058 Other specified cough: Secondary | ICD-10-CM | POA: Insufficient documentation

## 2022-08-25 DIAGNOSIS — M791 Myalgia, unspecified site: Secondary | ICD-10-CM | POA: Diagnosis not present

## 2022-08-25 DIAGNOSIS — R059 Cough, unspecified: Secondary | ICD-10-CM | POA: Insufficient documentation

## 2022-08-25 LAB — RESP PANEL BY RT-PCR (RSV, FLU A&B, COVID)  RVPGX2
Influenza A by PCR: NEGATIVE
Influenza B by PCR: NEGATIVE
Resp Syncytial Virus by PCR: NEGATIVE
SARS Coronavirus 2 by RT PCR: NEGATIVE

## 2022-08-25 NOTE — ED Notes (Signed)
Pt and family stated that the wait was too long and they are going to her PCP in the morning. Pt seen leaving ED.

## 2022-08-25 NOTE — ED Provider Triage Note (Signed)
Emergency Medicine Provider Triage Evaluation Note  [name] , a 71 y.o. female  was evaluated in triage.  Pt complains of sore throat, chills, body aches, cough, and nausea since yesterday.  Denies vomiting, diarrhea, abdominal pain, chest pain, shortness of breath.  Denies known sick contacts.  Review of Systems  Positive: As above Negative: As above  Physical Exam  BP 119/65 (BP Location: Right Arm)   Pulse 89   Temp 98.1 F (36.7 C) (Oral)   Resp 16   SpO2 96%  Gen:   Awake, no distress   Resp:  Normal effort, rhonchi appreciated in all lung fields MSK:   Moves extremities without difficulty  Other:    Medical Decision Making  Medically screening exam initiated at 4:52 PM.  Appropriate orders placed.  [name] was informed that the remainder of the evaluation will be completed by another provider, this initial triage assessment does not replace that evaluation, and the importance of remaining in the ED until their evaluation is complete.     Theressa Stamps R, [place] 08/25/22 [phone]

## 2022-08-25 NOTE — ED Triage Notes (Signed)
Pt c/o sore throat, chills, body aches, cough, and nausea since yesterday. Pt AxOx4. NAD.

## 2022-08-27 ENCOUNTER — Ambulatory Visit (INDEPENDENT_AMBULATORY_CARE_PROVIDER_SITE_OTHER): Payer: 59 | Admitting: Student

## 2022-08-27 VITALS — BP 112/71 | HR 87 | Ht 66.5 in | Wt 112.4 lb

## 2022-08-27 DIAGNOSIS — R569 Unspecified convulsions: Secondary | ICD-10-CM | POA: Diagnosis not present

## 2022-08-27 DIAGNOSIS — J441 Chronic obstructive pulmonary disease with (acute) exacerbation: Secondary | ICD-10-CM | POA: Diagnosis not present

## 2022-08-27 MED ORDER — PREDNISONE 20 MG PO TABS: 40.0000 mg | ORAL_TABLET | Freq: Every day | ORAL | 0 refills | Status: AC

## 2022-08-27 MED ORDER — AZITHROMYCIN 500 MG PO TABS: 500.0000 mg | ORAL_TABLET | Freq: Every day | ORAL | 0 refills | Status: AC

## 2022-08-27 NOTE — Patient Instructions (Addendum)
It was great to see you! Thank you for allowing me to participate in your care!   Our plans for today:  -[facility] please contact them to set up an appointment  [phone] given you had another seizure -I am sending in prednisone 40 mg daily for 5 days, as well as azithromycin 500 mg daily for 3 days-for a possible COPD exacerbation  Take care and seek immediate care sooner if you develop any concerns.  Gerrit Heck, MD

## 2022-08-27 NOTE — Progress Notes (Signed)
    SUBJECTIVE:   CHIEF COMPLAINT / HPI:   For 2 weeks has been coughing up phlegm. Patient went to ED 12/25 because of cough but also had seizure on Christmas. She left due to wait times and wanted to follow up at PCP office.and left due to wait times. She got CXR in ED which did not show any acute process. Covid and flu swabs were negative. No known fevers. Of note patient smokes 1 pack a week but has been smoking for 30 + years. Chest hurts when coughing. No chest pain at rest or radiation of pain. Has had diarrhea for last 2 days. Nyquil and Zquil she is using but not helping much. Sometimes has to use trelegy inhaler twice a day. Feels worn out and tired. She also has been taking her keppra as prescribed twice a day and does not know why she had a seizure.  PERTINENT  PMH / PSH: seizure hx of keppra  OBJECTIVE:   BP 112/71   Pulse 87   Ht 5' 6.5" (1.689 m)   Wt 112 lb 6.4 oz (51 kg)   SpO2 100%   BMI 17.87 kg/m   General: NAD, awake, alert, responsive to questions Head: Normocephalic atraumatic, oropharynx erythematous but no exudates, TM and canals clear b/l, no neck masses or adenopathy CV: Regular rate and rhythm no murmurs rubs or gallops Respiratory: Coarse crackles throughout lung fields bilaterally, chest rises symmetrically,  no increased work of breathing on RA Abdomen: Soft, non-tender Extremities: Moves upper and lower extremities freely, no edema in LE Neuro: No focal deficits Skin: No rashes or lesions visualized   ASSESSMENT/PLAN:   COPD exacerbation (HCC) Increased sputum and change in sputum consistent with COPD exacerbation. Recent CXR without PNA. -Prednisone 40 mg for 5 days -Azithromycin 500 for 3 days -ED/Return precautions discussed -Continue trelegy inhaler  Seizure ([place]) Has been taking keppra BID and seems adherant. Discussed reaching out to neurologist to set up appointment to discuss further. -[facility] number provided in AVS   Gerrit Heck,  MD [place]

## 2022-08-27 NOTE — Assessment & Plan Note (Signed)
Has been taking keppra BID and seems adherant. Discussed reaching out to neurologist to set up appointment to discuss further. -[facility] number provided in AVS

## 2022-08-27 NOTE — Assessment & Plan Note (Signed)
Increased sputum and change in sputum consistent with COPD exacerbation. Recent CXR without PNA. -Prednisone 40 mg for 5 days -Azithromycin 500 for 3 days -ED/Return precautions discussed -Continue trelegy inhaler

## 2022-09-02 NOTE — Progress Notes (Deleted)
[facility]  PATIENT: [name] DOB: [birth year]  REFERRING CLINICIAN: Shary Key, DO HISTORY FROM: Patient  REASON FOR VISIT: New onset seizure   HISTORICAL  CHIEF COMPLAINT:  No chief complaint on file.   Update 09/03/2022 JM: Patient returns for sooner scheduled visit reporting recent seizure activity on 12/25.  Reports compliance on Keppra without any missed dosages.  She presented to ED on 12/25 with complaints of sore throat, chills, body aches, cough, diarrhea and nausea that started on 12/24, CXR negative. Left prior to being seen due to wait time. Was seen by PCP 12/27 who felt in setting of COPD exacerbation and placed on prednisone and azithromycin.       History provided for reference purposes only INTERVAL HISTORY 11/05/2021 Dr. April Manson: Patient presents today for follow-up.  At last visit in September plan was to continue with Keppra 500 mg twice daily.  Keppra level has been checked, was 26.4.  She report compliance with medication, denies any side effect.  He did follow with his primary care doctor regarding the weight loss and is pending GI evaluation.  Patient also reports she is doing physical therapy.     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  Patient presented to the clinic by herself, reports that she does not remember the event.  Reports that the only thing that she remembers is waking up from the hospital.  She said that family had told her that she had a seizure at the house and in the hospital.  She said that her daughter had told her that she woke up in the middle of the night, screaming and then she fell and started foaming at the mouth.  She never had a seizure event in her life.  This was the first lifetime event. Seizure risk factor include a history of stroke and grand  son with seizures.  In the hospital she was loaded with Keppra, she had a brain MRI which showed her previous stroke and EEG which was normal.  She was continued on Keppra 500 mg twice daily and discharged home.  Patient stated since discharge she has been doing well, denies any seizure or seizure-like activity.  She is compliant with the Keppra 500 mg twice daily and denies any side effect from the medication.  She currently lives at home with her daughter and grandkids.  She does not drive and she is able to manage all daily activity.  She manages her defined her finances by her self.   Other issues brought today is her weight loss patient stated for the past 6 months she lost about 40 pounds and she is worried and concerned about that.  She has not followed up with her primary care doctor.     Handedness: Right handed   Seizure Type: Generalized convulsion  Current frequency: 1 seizure   Any injuries from seizures: No  Seizure risk factors: Grand daughter with seizure, strokes  Previous ASMs: None   Currenty ASMs: Levetiracetam   ASMs side effects: None   Brain Images: MRI Brain 04/10/2021 1.  No acute intracranial abnormality. 2. Small area of developing encephalomalacia with hemosiderin in the right superior occipital lobe corresponds to the recent CT finding. 3. Underlying chronic cerebellar infarcts with evidence of chronically poor flow or occlusion in the distal right vertebral artery. 4. Chronic cerebral white  matter disease is stable since last year.  Previous EEGs: rEEG 04/10/2021: This study is within normal limits. No seizures or epileptiform discharges were seen throughout the recording.   OTHER MEDICAL CONDITIONS: Stroke, HTN, HLD, COPD  REVIEW OF SYSTEMS: Full 14 system review of systems performed and negative with exception of: as noted in the HPI  ALLERGIES: No Known Allergies  HOME MEDICATIONS: Outpatient Medications Prior to Visit  Medication Sig Dispense  Refill   albuterol (VENTOLIN HFA) 108 (90 Base) MCG/ACT inhaler INHALE 2 PUFFS INTO THE LUNGS EVERY 4 HOURS AS NEEDED FOR WHEEZING OR SHORTNESS OF BREATH 18 g 1   amLODipine (NORVASC) 5 MG tablet TAKE 1 TABLET(5 MG) BY MOUTH DAILY 90 tablet 1   aspirin EC 81 MG tablet Take 1 tablet (81 mg total) by mouth daily. 90 tablet 3   atorvastatin (LIPITOR) 80 MG tablet Take 1 tablet (80 mg total) by mouth daily at 6 PM. 90 tablet 3   capsaicin (ZOSTRIX) 0.025 % cream Apply topically 2 (two) times daily. 60 g 0   doxycycline (VIBRAMYCIN) 100 MG capsule Take 1 capsule (100 mg total) by mouth 2 (two) times daily. 14 capsule 0   feeding supplement (ENSURE ENLIVE / ENSURE PLUS) LIQD Take 237 mLs by mouth 3 (three) times daily between meals. 237 mL 12   levETIRAcetam (KEPPRA) 750 MG tablet Take 1 tablet (750 mg total) by mouth 2 (two) times daily. 60 tablet 12   metoprolol tartrate (LOPRESSOR) 25 MG tablet Take 1 tablet (25 mg total) by mouth 2 (two) times daily. 60 tablet 0   nystatin (MYCOSTATIN/NYSTOP) powder Apply 1 application topically 3 (three) times daily. 15 g 0   ondansetron (ZOFRAN) 4 MG tablet TAKE 1 TABLET(4 MG) BY MOUTH EVERY 8 HOURS AS NEEDED FOR NAUSEA OR VOMITING 20 tablet 0   pantoprazole (PROTONIX) 40 MG tablet TAKE 1 TABLET(40 MG) BY MOUTH DAILY 90 tablet 0   traMADol (ULTRAM) 50 MG tablet Take 1 tablet (50 mg total) by mouth at bedtime as needed (pain). 5 tablet 0   TRELEGY ELLIPTA 100-62.5-25 MCG/ACT AEPB INHALE 1 PUFF INTO THE LUNGS DAILY 60 each 1   No facility-administered medications prior to visit.    PAST MEDICAL HISTORY: Past Medical History:  Diagnosis Date   Abdominal pain, epigastric 02/23/2013   Abdominal pain, unspecified site 08/15/2013   Abnormal neurological exam 04/11/2016   Acute CVA (cerebrovascular accident) ([place]) 11/03/2019   Acute non-recurrent frontal sinusitis 05/21/2018   Asthma    Cataracts, bilateral 05/03/2014   Cephalalgia    Chronic bronchitis (HCC)     Chronic lower back pain    COPD (chronic obstructive pulmonary disease) (HCC)    CVA (cerebral vascular accident) ([place]) 11/04/2019   Daily headache    De Quervain's tenosynovitis, left 11/09/2014   Essential hypertension 10/08/2021   GERD (gastroesophageal reflux disease)    Hx of colonic polyps    Maxillary sinusitis    Nausea & vomiting    Orthopnea 09/28/2015   Reports of 5 pillow orthopnea (from 2-3) which began with cold symptoms. Could be due to cold but would like to rule out cardiac etiology. Also noted of 1 year history of dyspnea on exertion which only worsens when she gets a cold; this could be due to COPD however cardiac etiology should be considered. No JVD or LE edema on exam.  - ECHO ordered - will refer to cardiology if ECHO concerning for H   PSORIASIS 12/29/2006   Qualifier: Diagnosis  of  By: Jobe Igo MD, David     Pure hypercholesterolemia 10/08/2021   Right leg pain 04/02/2016   Smoker    Vertebral artery occlusion, right 10/08/2021   Hx of Rt vertebral artery occlusion    PAST SURGICAL HISTORY: Past Surgical History:  Procedure Laterality Date   ABDOMINAL AORTOGRAM W/LOWER EXTREMITY N/A 10/06/2016   Procedure: Abdominal Aortogram w/Lower Extremity;  Surgeon: Waynetta Sandy, MD;  Location: [facility];  Service: Cardiovascular;  Laterality: N/A;   ABDOMINAL AORTOGRAM W/LOWER EXTREMITY N/A 02/10/2018   Procedure: ABDOMINAL AORTOGRAM W/LOWER EXTREMITY;  Surgeon: Waynetta Sandy, MD;  Location: [facility];  Service: Cardiovascular;  Laterality: N/A;   ABDOMINAL HERNIA REPAIR     APPENDECTOMY     COLONOSCOPY W/ POLYPECTOMY  02/26/2005   FEMORAL-POPLITEAL BYPASS GRAFT Left 04/20/2018   Procedure: Left FEMORAL-POPLITEAL ARTERY Bypass Graft using Gore Propaten Vascular Graft;  Surgeon: Waynetta Sandy, MD;  Location: [facility];  Service: Vascular;  Laterality: Left;   HERNIA REPAIR     LOWER EXTREMITY ANGIOGRAPHY Bilateral 04/19/2018   Procedure:  LOWER EXTREMITY ANGIOGRAPHY;  Surgeon: Waynetta Sandy, MD;  Location: [facility];  Service: Cardiovascular;  Laterality: Bilateral;   PERIPHERAL VASCULAR ATHERECTOMY Right 10/06/2016   Procedure: Peripheral Vascular Atherectomy;  Surgeon: Waynetta Sandy, MD;  Location: [facility];  Service: Cardiovascular;  Laterality: Right;   PERIPHERAL VASCULAR INTERVENTION Right 10/06/2016   Procedure: Peripheral Vascular Intervention;  Surgeon: Waynetta Sandy, MD;  Location: [facility];  Service: Cardiovascular;  Laterality: Right;  SFA   PERIPHERAL VASCULAR INTERVENTION Left 02/10/2018   Procedure: PERIPHERAL VASCULAR INTERVENTION;  Surgeon: Waynetta Sandy, MD;  Location: [facility];  Service: Cardiovascular;  Laterality: Left;   TONSILLECTOMY  1960s   UPPER GASTROINTESTINAL ENDOSCOPY  03/22/2010, 02/13/2005   VAGINAL HYSTERECTOMY     partial    FAMILY HISTORY: Family History  Problem Relation Age of Onset   Cancer Other    Coronary artery disease Other    Cancer Mother    Hypertension Mother    Heart disease Mother    Cancer Father    Hypertension Father    Cancer Sister    Hypertension Sister     SOCIAL HISTORY: Social History   Socioeconomic History   Marital status: Single    Spouse name: Not on file   Number of children: 2   Years of education: 12   Highest education level: High school graduate  Occupational History   Not on file  Tobacco Use   Smoking status: Every Day    Packs/day: 0.50    Years: 47.00    Total pack years: 23.50    Types: Cigarettes   Smokeless tobacco: Never   Tobacco comments:    3-4 cigarettes per day  Vaping Use   Vaping Use: Never used  Substance and Sexual Activity   Alcohol use: Not Currently    Alcohol/week: 0.0 standard drinks of alcohol    Comment: Quit alcohol in 1992; "never really drank much alcohol"   Drug use: Yes    Types: Marijuana    Comment: 02/10/2018 "~ twice//wk"    Sexual activity: Not Currently  Other Topics Concern   Not on file  Social History Narrative   Patient lives alone in [place].    Patient has two adult children nearby.    Patient has never been married.    Patient enjoys riding motorcycles and she is  part of a "biker club."   Patient enjoys shopping and going to sporting events.   Social Determinants of Health   Financial Resource Strain: Low Risk  (08/16/2020)   Overall Financial Resource Strain (CARDIA)    Difficulty of Paying Living Expenses: Not hard at all  Food Insecurity: No Food Insecurity (08/16/2020)   Hunger Vital Sign    Worried About Running Out of Food in the Last Year: Never true    Ran Out of Food in the Last Year: Never true  Transportation Needs: No Transportation Needs (08/16/2020)   PRAPARE - Hydrologist (Medical): No    Lack of Transportation (Non-Medical): No  Physical Activity: Inactive (08/16/2020)   Exercise Vital Sign    Days of Exercise per Week: 0 days    Minutes of Exercise per Session: 0 min  Stress: No Stress Concern Present (08/16/2020)   [place]    Feeling of Stress : Only a little  Social Connections: Moderately Integrated (08/16/2020)   Social Connection and Isolation Panel [NHANES]    Frequency of Communication with Friends and Family: More than three times a week    Frequency of Social Gatherings with Friends and Family: More than three times a week    Attends Religious Services: More than 4 times per year    Active Member of [employer] or Organizations: Yes    Attends Archivist Meetings: 1 to 4 times per year    Marital Status: Never married  Intimate Partner Violence: Not At Risk (08/16/2020)   Humiliation, Afraid, Rape, and Kick questionnaire    Fear of Current or Ex-Partner: No    Emotionally Abused: No    Physically Abused: No    Sexually Abused: No     PHYSICAL  EXAM  GENERAL EXAM/CONSTITUTIONAL: Vitals:  There were no vitals filed for this visit.  There is no height or weight on file to calculate BMI. Wt Readings from Last 3 Encounters:  08/27/22 112 lb 6.4 oz (51 kg)  12/22/21 150 lb (68 kg)  11/11/21 120 lb (54.4 kg)   Patient is in no distress; well developed, nourished and groomed; neck is supple  CARDIOVASCULAR: Examination of carotid arteries is normal; no carotid bruits Regular rate and rhythm, no murmurs Examination of peripheral vascular system by observation and palpation is normal  EYES: Pupils round and reactive to light, Visual fields full to confrontation, Extraocular movements intacts,  MUSCULOSKELETAL: Gait, strength, tone, movements noted in Neurologic exam below  NEUROLOGIC: MENTAL STATUS: awake, alert, oriented to person, place and time recent and remote memory intact normal attention and concentration language fluent, comprehension intact, naming intact fund of knowledge appropriate  CRANIAL NERVE:  2nd, 3rd, 4th, 6th - pupils equal and reactive to light, visual fields full to confrontation, extraocular muscles intact, no nystagmus 5th - facial sensation symmetric 7th - facial strength symmetric 8th - hearing intact 9th - palate elevates symmetrically, uvula midline 11th - shoulder shrug symmetric 12th - tongue protrusion midline  MOTOR:  normal bulk and tone, full strength in the BUE, BLE  SENSORY:  normal and symmetric to light touch, pinprick, temperature, vibration  COORDINATION:  finger-nose-finger, fine finger movements normal  REFLEXES:  deep tendon reflexes present and symmetric  GAIT/STATION:  normal    DIAGNOSTIC DATA (LABS, IMAGING, TESTING) - I reviewed patient records, labs, notes, testing and imaging myself where available.  Lab Results  Component Value Date   WBC  7.8 12/22/2021   HGB 11.5 (L) 12/22/2021   HCT 36.4 12/22/2021   MCV 98.1 12/22/2021   PLT 245 12/22/2021       Component Value Date/Time   NA 139 12/22/2021 0850   NA 148 (H) 10/25/2021 1540   K 4.0 12/22/2021 0850   CL 107 12/22/2021 0850   CO2 22 12/22/2021 0850   GLUCOSE 116 (H) 12/22/2021 0850   BUN 13 12/22/2021 0850   BUN 11 10/25/2021 1540   CREATININE 1.05 (H) 12/22/2021 0850   CREATININE 0.79 08/15/2013 1446   CALCIUM 9.1 12/22/2021 0850   PROT 6.5 12/22/2021 0850   PROT 6.5 10/25/2021 1540   ALBUMIN 3.9 12/22/2021 0850   ALBUMIN 4.5 10/25/2021 1540   AST 20 12/22/2021 0850   ALT 11 12/22/2021 0850   ALKPHOS 84 12/22/2021 0850   BILITOT 0.2 (L) 12/22/2021 0850   BILITOT 0.3 10/25/2021 1540   GFRNONAA 58 (L) 12/22/2021 0850   GFRAA 74 09/12/2020 1456   Lab Results  Component Value Date   CHOL 114 10/08/2021   HDL 41 10/08/2021   LDLCALC NOT CALCULATED 10/08/2021   TRIG 51 10/08/2021   Lab Results  Component Value Date   HGBA1C 6.1 (H) 10/08/2021   Lab Results  Component Value Date   VITAMINB12 439 04/10/2021   Lab Results  Component Value Date   TSH 0.762 10/07/2021    Brain MRI 04/10/2021 1.  No acute intracranial abnormality. 2. Small area of developing encephalomalacia with hemosiderin in the right superior occipital lobe corresponds to the recent CT finding. 3. Underlying chronic cerebellar infarcts with evidence of chronically poor flow or occlusion in the distal right vertebral artery. 4. Chronic cerebral white matter disease is stable since last year.   Routine EEG 04/10/2021 This study is within normal limits. No seizures or epileptiform discharges were seen throughout the recording.  I personally reviewed brain Images and previous EEG reports.   ASSESSMENT AND PLAN  72 y.o. year old female  with past medical history of stroke secondary to right vertebral artery occlusion, hypertension, hyperlipidemia, COPD who is presenting for follow-up for her seizure disorder.  Since starting Keppra 500 mg twice daily, she has not had any additional seizures.  She  reports compliance with medication, denies any side effect from the medication.  Last Keppra level was 26.4.  At this time, I will continue patient on Keppra 500 mg twice daily and I will see her in 1 year for follow-up.  Advised patient to contact me if she has another seizure.  She is comfortable with plan.  Follow-up with your doctor as as indicated.   No diagnosis found.   There are no Patient Instructions on file for this visit.     No orders of the defined types were placed in this encounter.   No orders of the defined types were placed in this encounter.   No follow-ups on file.    I spent *** minutes of face-to-face and non-face-to-face time with patient.  This included previsit chart review, lab review, study review, order entry, electronic health record documentation, patient education  Frann Rider, [facility]  [facility] [address] [place], [place] [address]  Phone [phone] Fax [phone] Note: This document was prepared with digital dictation and possible smart phrase technology. Any transcriptional errors that result from this process are unintentional.

## 2022-09-03 ENCOUNTER — Ambulatory Visit: Payer: 59 | Admitting: Adult Health

## 2022-09-13 ENCOUNTER — Other Ambulatory Visit: Payer: Self-pay | Admitting: Family Medicine

## 2022-09-13 DIAGNOSIS — R110 Nausea: Secondary | ICD-10-CM

## 2022-09-25 ENCOUNTER — Other Ambulatory Visit: Payer: Self-pay | Admitting: Family Medicine

## 2022-09-25 DIAGNOSIS — R110 Nausea: Secondary | ICD-10-CM

## 2022-10-24 ENCOUNTER — Other Ambulatory Visit: Payer: Self-pay | Admitting: Family Medicine

## 2022-10-24 DIAGNOSIS — R110 Nausea: Secondary | ICD-10-CM

## 2022-12-01 ENCOUNTER — Ambulatory Visit (INDEPENDENT_AMBULATORY_CARE_PROVIDER_SITE_OTHER): Payer: 59 | Admitting: Neurology

## 2022-12-01 ENCOUNTER — Encounter: Payer: Self-pay | Admitting: Neurology

## 2022-12-01 VITALS — BP 107/62 | HR 75 | Ht 66.0 in | Wt 113.0 lb

## 2022-12-01 DIAGNOSIS — G40009 Localization-related (focal) (partial) idiopathic epilepsy and epileptic syndromes with seizures of localized onset, not intractable, without status epilepticus: Secondary | ICD-10-CM | POA: Diagnosis not present

## 2022-12-01 DIAGNOSIS — Z5181 Encounter for therapeutic drug level monitoring: Secondary | ICD-10-CM | POA: Diagnosis not present

## 2022-12-01 DIAGNOSIS — G40309 Generalized idiopathic epilepsy and epileptic syndromes, not intractable, without status epilepticus: Secondary | ICD-10-CM

## 2022-12-01 MED ORDER — LEVETIRACETAM 1000 MG PO TABS: 1000.0000 mg | ORAL_TABLET | Freq: Two times a day (BID) | ORAL | 12 refills | Status: DC

## 2022-12-01 NOTE — Patient Instructions (Signed)
Increase Keppra 1000 mg twice daily  Keppra level today  Follow up in 1 year or sooner if worse

## 2022-12-01 NOTE — Progress Notes (Unsigned)
[facility]  PATIENT: [name] DOB: 03-27-1951  REFERRING CLINICIAN: Shary Key, DO HISTORY FROM: Patient  REASON FOR VISIT: Seizure follow up   HISTORICAL  CHIEF COMPLAINT:  Chief Complaint  Patient presents with   Follow-up    Rm 13 f/u to recent seizure activity 3/26, c/o confusion and weakness     INTERVAL HISTORY 41/2024:  Patient presents today for follow-up, last visit was a year ago.  Since then she has been doing well on Keppra 500 mg twice daily.  She reported March 26, she believes she had a seizure, she was very confused and had weakness.  This event was not with witnessed by anyone. She reports compliance with the Keppra.    INTERVAL HISTORY 11/05/2021: Patient presents today for follow-up.  At last visit in September plan was to continue with Keppra 500 mg twice daily.  Keppra level has been checked, was 26.4.  She report compliance with medication, denies any side effect.  He did follow with his primary care doctor regarding the weight loss and is pending GI evaluation.  Patient also reports she is doing physical therapy.     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  Patient presented to the clinic by herself, reports that she does not remember the event.  Reports that the only thing that she remembers is waking up from the hospital.  She said that family had told her that she had a seizure at the house and in the hospital.  She said that her daughter had told her that she woke up in the middle of the night, screaming and then she fell and started foaming at the mouth.  She never had a seizure event in her life.  This was the first lifetime event. Seizure risk factor include a history of stroke and grand son with seizures.  In the hospital she was loaded with Keppra, she had a brain  MRI which showed her previous stroke and EEG which was normal.  She was continued on Keppra 500 mg twice daily and discharged home.  Patient stated since discharge she has been doing well, denies any seizure or seizure-like activity.  She is compliant with the Keppra 500 mg twice daily and denies any side effect from the medication.  She currently lives at home with her daughter and grandkids.  She does not drive and she is able to manage all daily activity.  She manages her defined her finances by her self.   Other issues brought today is her weight loss patient stated for the past 6 months she lost about 40 pounds and she is worried and concerned about that.  She has not followed up with her primary care doctor.     Handedness: Right handed   Seizure Type: Generalized convulsion  Current frequency: 1 seizure   Any injuries from seizures: No  Seizure risk factors: Grand daughter with seizure, strokes  Previous ASMs: None   Currenty ASMs: Levetiracetam   ASMs side effects: None   Brain Images: MRI Brain 04/10/2021 1.  No acute intracranial abnormality. 2. Small area of developing encephalomalacia with hemosiderin in the right superior occipital lobe corresponds to the recent CT finding. 3. Underlying chronic cerebellar infarcts with evidence of chronically poor flow or occlusion in the distal right vertebral artery. 4. Chronic cerebral white matter disease is stable  since last year.  Previous EEGs: rEEG 04/10/2021: This study is within normal limits. No seizures or epileptiform discharges were seen throughout the recording.   OTHER MEDICAL CONDITIONS: Stroke, HTN, HLD, COPD  REVIEW OF SYSTEMS: Full 14 system review of systems performed and negative with exception of: as noted in the HPI  ALLERGIES: No Known Allergies  HOME MEDICATIONS: Outpatient Medications Prior to Visit  Medication Sig Dispense Refill   albuterol (VENTOLIN HFA) 108 (90 Base) MCG/ACT inhaler INHALE 2 PUFFS  INTO THE LUNGS EVERY 4 HOURS AS NEEDED FOR WHEEZING OR SHORTNESS OF BREATH 18 g 1   amLODipine (NORVASC) 5 MG tablet Take 1 tablet (5 mg total) by mouth daily. Please schedule office visit prior to any additional refills 90 tablet 0   aspirin EC 81 MG tablet Take 1 tablet (81 mg total) by mouth daily. 90 tablet 3   atorvastatin (LIPITOR) 80 MG tablet Take 1 tablet (80 mg total) by mouth daily at 6 PM. 90 tablet 3   capsaicin (ZOSTRIX) 0.025 % cream Apply topically 2 (two) times daily. 60 g 0   feeding supplement (ENSURE ENLIVE / ENSURE PLUS) LIQD Take 237 mLs by mouth 3 (three) times daily between meals. 237 mL 12   metoprolol tartrate (LOPRESSOR) 25 MG tablet Take 1 tablet (25 mg total) by mouth 2 (two) times daily. 60 tablet 0   nystatin (MYCOSTATIN/NYSTOP) powder Apply 1 application topically 3 (three) times daily. 15 g 0   ondansetron (ZOFRAN) 4 MG tablet Take 1 tablet (4 mg total) by mouth every 8 (eight) hours as needed for nausea or vomiting. Please schedule office visit prior to any additional refills. 20 tablet 0   pantoprazole (PROTONIX) 40 MG tablet TAKE 1 TABLET(40 MG) BY MOUTH DAILY 90 tablet 0   traMADol (ULTRAM) 50 MG tablet Take 1 tablet (50 mg total) by mouth at bedtime as needed (pain). 5 tablet 0   TRELEGY ELLIPTA 100-62.5-25 MCG/ACT AEPB INHALE 1 PUFF INTO THE LUNGS DAILY 60 each 1   doxycycline (VIBRAMYCIN) 100 MG capsule Take 1 capsule (100 mg total) by mouth 2 (two) times daily. 14 capsule 0   levETIRAcetam (KEPPRA) 750 MG tablet Take 1 tablet (750 mg total) by mouth 2 (two) times daily. 60 tablet 12   No facility-administered medications prior to visit.    PAST MEDICAL HISTORY: Past Medical History:  Diagnosis Date   Abdominal pain, epigastric 02/23/2013   Abdominal pain, unspecified site 08/15/2013   Abnormal neurological exam 04/11/2016   Acute CVA (cerebrovascular accident) 11/03/2019   Acute non-recurrent frontal sinusitis 05/21/2018   Asthma    Cataracts, bilateral  05/03/2014   Cephalalgia    Chronic bronchitis    Chronic lower back pain    COPD (chronic obstructive pulmonary disease)    CVA (cerebral vascular accident) 11/04/2019   Daily headache    De Quervain's tenosynovitis, left 11/09/2014   Essential hypertension 10/08/2021   GERD (gastroesophageal reflux disease)    Hx of colonic polyps    Maxillary sinusitis    Nausea & vomiting    Orthopnea 09/28/2015   Reports of 5 pillow orthopnea (from 2-3) which began with cold symptoms. Could be due to cold but would like to rule out cardiac etiology. Also noted of 1 year history of dyspnea on exertion which only worsens when she gets a cold; this could be due to COPD however cardiac etiology should be considered. No JVD or LE edema on exam.  - ECHO ordered - will refer  to cardiology if ECHO concerning for H   PSORIASIS 12/29/2006   Qualifier: Diagnosis of  By: Jobe Igo MD, David     Pure hypercholesterolemia 10/08/2021   Right leg pain 04/02/2016   Smoker    Vertebral artery occlusion, right 10/08/2021   Hx of Rt vertebral artery occlusion    PAST SURGICAL HISTORY: Past Surgical History:  Procedure Laterality Date   ABDOMINAL AORTOGRAM W/LOWER EXTREMITY N/A 10/06/2016   Procedure: Abdominal Aortogram w/Lower Extremity;  Surgeon: Waynetta Sandy, MD;  Location: [facility];  Service: Cardiovascular;  Laterality: N/A;   ABDOMINAL AORTOGRAM W/LOWER EXTREMITY N/A 02/10/2018   Procedure: ABDOMINAL AORTOGRAM W/LOWER EXTREMITY;  Surgeon: Waynetta Sandy, MD;  Location: [facility];  Service: Cardiovascular;  Laterality: N/A;   ABDOMINAL HERNIA REPAIR     APPENDECTOMY     COLONOSCOPY W/ POLYPECTOMY  02/26/2005   FEMORAL-POPLITEAL BYPASS GRAFT Left 04/20/2018   Procedure: Left FEMORAL-POPLITEAL ARTERY Bypass Graft using Gore Propaten Vascular Graft;  Surgeon: Waynetta Sandy, MD;  Location: [facility];  Service: Vascular;  Laterality: Left;   HERNIA REPAIR     LOWER EXTREMITY  ANGIOGRAPHY Bilateral 04/19/2018   Procedure: LOWER EXTREMITY ANGIOGRAPHY;  Surgeon: Waynetta Sandy, MD;  Location: [facility];  Service: Cardiovascular;  Laterality: Bilateral;   PERIPHERAL VASCULAR ATHERECTOMY Right 10/06/2016   Procedure: Peripheral Vascular Atherectomy;  Surgeon: Waynetta Sandy, MD;  Location: [facility];  Service: Cardiovascular;  Laterality: Right;   PERIPHERAL VASCULAR INTERVENTION Right 10/06/2016   Procedure: Peripheral Vascular Intervention;  Surgeon: Waynetta Sandy, MD;  Location: [facility];  Service: Cardiovascular;  Laterality: Right;  SFA   PERIPHERAL VASCULAR INTERVENTION Left 02/10/2018   Procedure: PERIPHERAL VASCULAR INTERVENTION;  Surgeon: Waynetta Sandy, MD;  Location: [facility];  Service: Cardiovascular;  Laterality: Left;   TONSILLECTOMY  1960s   UPPER GASTROINTESTINAL ENDOSCOPY  03/22/2010, 02/13/2005   VAGINAL HYSTERECTOMY     partial    FAMILY HISTORY: Family History  Problem Relation Age of Onset   Cancer Other    Coronary artery disease Other    Cancer Mother    Hypertension Mother    Heart disease Mother    Cancer Father    Hypertension Father    Cancer Sister    Hypertension Sister     SOCIAL HISTORY: Social History   Socioeconomic History   Marital status: Single    Spouse name: Not on file   Number of children: 2   Years of education: 12   Highest education level: High school graduate  Occupational History   Not on file  Tobacco Use   Smoking status: Every Day    Packs/day: 0.50    Years: 47.00    Additional pack years: 0.00    Total pack years: 23.50    Types: Cigarettes   Smokeless tobacco: Never   Tobacco comments:    3-4 cigarettes per day  Vaping Use   Vaping Use: Never used  Substance and Sexual Activity   Alcohol use: Not Currently    Alcohol/week: 0.0 standard drinks of alcohol    Comment: Quit alcohol in 1992; "never really drank much  alcohol"   Drug use: Yes    Types: Marijuana    Comment: 02/10/2018 "~ twice//wk"   Sexual activity: Not Currently  Other Topics Concern   Not on file  Social History Narrative   Patient lives alone in [place].    Patient has  two adult children nearby.    Patient has never been married.    Patient enjoys riding motorcycles and she is part of a "biker club."   Patient enjoys shopping and going to sporting events.   Social Determinants of Health   Financial Resource Strain: Low Risk  (08/16/2020)   Overall Financial Resource Strain (CARDIA)    Difficulty of Paying Living Expenses: Not hard at all  Food Insecurity: No Food Insecurity (08/16/2020)   Hunger Vital Sign    Worried About Running Out of Food in the Last Year: Never true    Ran Out of Food in the Last Year: Never true  Transportation Needs: No Transportation Needs (08/16/2020)   PRAPARE - Hydrologist (Medical): No    Lack of Transportation (Non-Medical): No  Physical Activity: Inactive (08/16/2020)   Exercise Vital Sign    Days of Exercise per Week: 0 days    Minutes of Exercise per Session: 0 min  Stress: No Stress Concern Present (08/16/2020)   [place]    Feeling of Stress : Only a little  Social Connections: Moderately Integrated (08/16/2020)   Social Connection and Isolation Panel [NHANES]    Frequency of Communication with Friends and Family: More than three times a week    Frequency of Social Gatherings with Friends and Family: More than three times a week    Attends Religious Services: More than 4 times per year    Active Member of [employer] or Organizations: Yes    Attends Archivist Meetings: 1 to 4 times per year    Marital Status: Never married  Intimate Partner Violence: Not At Risk (08/16/2020)   Humiliation, Afraid, Rape, and Kick questionnaire    Fear of Current or Ex-Partner: No    Emotionally  Abused: No    Physically Abused: No    Sexually Abused: No     PHYSICAL EXAM  GENERAL EXAM/CONSTITUTIONAL: Vitals:  Vitals:   12/01/22 1127  BP: 107/62  Pulse: 75  Weight: 113 lb (51.3 kg)  Height: 5\' 6"  (1.676 m)   Body mass index is 18.24 kg/m. Wt Readings from Last 3 Encounters:  12/01/22 113 lb (51.3 kg)  08/27/22 112 lb 6.4 oz (51 kg)  12/22/21 150 lb (68 kg)   Patient is in no distress; well developed, nourished and groomed; neck is supple  EYES: Visual fields full to confrontation, Extraocular movements intacts,  MUSCULOSKELETAL: Gait, strength, tone, movements noted in Neurologic exam below  NEUROLOGIC: MENTAL STATUS: awake, alert, oriented to person, place and time recent and remote memory intact normal attention and concentration language fluent, comprehension intact, naming intact fund of knowledge appropriate  CRANIAL NERVE:  2nd, 3rd, 4th, 6th - visual fields full to confrontation, extraocular muscles intact, no nystagmus 5th - facial sensation symmetric 7th - facial strength symmetric 8th - hearing intact 9th - palate elevates symmetrically, uvula midline 11th - shoulder shrug symmetric 12th - tongue protrusion midline  MOTOR:  normal bulk and tone, full strength in the BUE, BLE  SENSORY:  normal and symmetric to light touch  COORDINATION:  finger-nose-finger, fine finger movements normal  REFLEXES:  deep tendon reflexes present and symmetric  GAIT/STATION:  normal    DIAGNOSTIC DATA (LABS, IMAGING, TESTING) - I reviewed patient records, labs, notes, testing and imaging myself where available.  Lab Results  Component Value Date   WBC 7.8 12/22/2021   HGB 11.5 (L) 12/22/2021  HCT 36.4 12/22/2021   MCV 98.1 12/22/2021   PLT 245 12/22/2021      Component Value Date/Time   NA 139 12/22/2021 0850   NA 148 (H) 10/25/2021 1540   K 4.0 12/22/2021 0850   CL 107 12/22/2021 0850   CO2 22 12/22/2021 0850   GLUCOSE 116 (H)  12/22/2021 0850   BUN 13 12/22/2021 0850   BUN 11 10/25/2021 1540   CREATININE 1.05 (H) 12/22/2021 0850   CREATININE 0.79 08/15/2013 1446   CALCIUM 9.1 12/22/2021 0850   PROT 6.5 12/22/2021 0850   PROT 6.5 10/25/2021 1540   ALBUMIN 3.9 12/22/2021 0850   ALBUMIN 4.5 10/25/2021 1540   AST 20 12/22/2021 0850   ALT 11 12/22/2021 0850   ALKPHOS 84 12/22/2021 0850   BILITOT 0.2 (L) 12/22/2021 0850   BILITOT 0.3 10/25/2021 1540   GFRNONAA 58 (L) 12/22/2021 0850   GFRAA 74 09/12/2020 1456   Lab Results  Component Value Date   CHOL 114 10/08/2021   HDL 41 10/08/2021   LDLCALC NOT CALCULATED 10/08/2021   TRIG 51 10/08/2021   Lab Results  Component Value Date   HGBA1C 6.1 (H) 10/08/2021   Lab Results  Component Value Date   VITAMINB12 439 04/10/2021   Lab Results  Component Value Date   TSH 0.762 10/07/2021    Brain MRI 04/10/2021 1.  No acute intracranial abnormality. 2. Small area of developing encephalomalacia with hemosiderin in the right superior occipital lobe corresponds to the recent CT finding. 3. Underlying chronic cerebellar infarcts with evidence of chronically poor flow or occlusion in the distal right vertebral artery. 4. Chronic cerebral white matter disease is stable since last year.   Routine EEG 04/10/2021 This study is within normal limits. No seizures or epileptiform discharges were seen throughout the recording.  I personally reviewed brain Images and previous EEG reports.   ASSESSMENT AND PLAN  72 y.o. year old female  with past medical history of stroke secondary to right vertebral artery occlusion, hypertension, hyperlipidemia, COPD who is presenting for follow-up for her seizure disorder.  She has been doing well except a breakthrough seizure on 3/26. Will increase her Keppra to 1000 mg twice daily. Advise patient to contact me if she continues to have breakthrough seizures, follow up in a year or sooner if worse.      1. Generalized idiopathic  epilepsy and epileptic syndromes, not intractable, without status epilepticus   2. Therapeutic drug monitoring      Patient Instructions  Increase Keppra 1000 mg twice daily  Keppra level today  Follow up in 1 year or sooner if worse    Per [facility] statutes, patients with seizures are not allowed to drive until they have been seizure-free for six months.  Other recommendations include using caution when using heavy equipment or power tools. Avoid working on ladders or at heights. Take showers instead of baths.  Do not swim alone.  Ensure the water temperature is not too high on the home water heater. Do not go swimming alone. Do not lock yourself in a room alone (i.e. bathroom). When caring for infants or small children, sit down when holding, feeding, or changing them to minimize risk of injury to the child in the event you have a seizure. Maintain good sleep hygiene. Avoid alcohol.  Also recommend adequate sleep, hydration, good diet and minimize stress.   During the Seizure  - First, ensure adequate ventilation and place patients on the floor on their  left side  Loosen clothing around the neck and ensure the airway is patent. If the patient is clenching the teeth, do not force the mouth open with any object as this can cause severe damage - Remove all items from the surrounding that can be hazardous. The patient may be oblivious to what's happening and may not even know what he or she is doing. If the patient is confused and wandering, either gently guide him/her away and block access to outside areas - Reassure the individual and be comforting - Call 911. In most cases, the seizure ends before EMS arrives. However, there are cases when seizures may last over 3 to 5 minutes. Or the individual may have developed breathing difficulties or severe injuries. If a pregnant patient or a person with diabetes develops a seizure, it is prudent to call an ambulance. - Finally, if the patient  does not regain full consciousness, then call EMS. Most patients will remain confused for about 45 to 90 minutes after a seizure, so you must use judgment in calling for help. - Avoid restraints but make sure the patient is in a bed with padded side rails - Place the individual in a lateral position with the neck slightly flexed; this will help the saliva drain from the mouth and prevent the tongue from falling backward - Remove all nearby furniture and other hazards from the area - Provide verbal assurance as the individual is regaining consciousness - Provide the patient with privacy if possible - Call for help and start treatment as ordered by the caregiver   After the Seizure (Postictal Stage)  After a seizure, most patients experience confusion, fatigue, muscle pain and/or a headache. Thus, one should permit the individual to sleep. For the next few days, reassurance is essential. Being calm and helping reorient the person is also of importance.  Most seizures are painless and end spontaneously. Seizures are not harmful to others but can lead to complications such as stress on the lungs, brain and the heart. Individuals with prior lung problems may develop labored breathing and respiratory distress.     Orders Placed This Encounter  Procedures   Levetiracetam level    Meds ordered this encounter  Medications   levETIRAcetam (KEPPRA) 1000 MG tablet    Sig: Take 1 tablet (1,000 mg total) by mouth 2 (two) times daily.    Dispense:  60 tablet    Refill:  12    Return in about 1 year (around 12/01/2023).    Alric Ran, MD 12/02/2022, 8:04 AM  [facility] [address] [place],  [address] [phone]

## 2022-12-02 ENCOUNTER — Other Ambulatory Visit: Payer: Self-pay | Admitting: Family Medicine

## 2022-12-02 ENCOUNTER — Other Ambulatory Visit: Payer: Self-pay | Admitting: Neurology

## 2022-12-02 DIAGNOSIS — R110 Nausea: Secondary | ICD-10-CM

## 2022-12-02 DIAGNOSIS — J4489 Other specified chronic obstructive pulmonary disease: Secondary | ICD-10-CM

## 2022-12-03 ENCOUNTER — Telehealth: Payer: Self-pay

## 2022-12-03 LAB — LEVETIRACETAM LEVEL: Levetiracetam Lvl: 57.8 ug/mL — ABNORMAL HIGH (ref 10.0–40.0)

## 2022-12-03 NOTE — Progress Notes (Signed)
Please call and advise the patient that the recent Keppra level was elevated to 57. Please advised patient to reduce the Keppra back to 500 mg twice daily, do not take 1000 mg twice daily.   Please remind patient to keep any upcoming appointments or tests and to call us with any interim questions, concerns, problems or updates. Thanks,   Alric Ran, MD

## 2022-12-03 NOTE — Telephone Encounter (Signed)
Please refer patient back to PCP. Thanks

## 2022-12-03 NOTE — Telephone Encounter (Signed)
-----   Message from Alric Ran, MD sent at 12/03/2022 10:44 AM EDT ----- Please call and advise the patient that the recent [place] level was elevated to 57. Please advised patient to reduce the Keppra back to 500 mg twice daily, do not take 1000 mg twice daily.   Please remind patient to keep any upcoming appointments or tests and to call us with any interim questions, concerns, problems or updates. Thanks,   Alric Ran, MD

## 2022-12-03 NOTE — Telephone Encounter (Signed)
Called and relayed results to daughter per [facility]

## 2022-12-05 ENCOUNTER — Other Ambulatory Visit: Payer: Self-pay

## 2022-12-05 DIAGNOSIS — J4489 Other specified chronic obstructive pulmonary disease: Secondary | ICD-10-CM

## 2022-12-05 MED ORDER — TRELEGY ELLIPTA 100-62.5-25 MCG/ACT IN AEPB: 1.0000 | INHALATION_SPRAY | Freq: Every day | RESPIRATORY_TRACT | 1 refills | Status: DC

## 2022-12-16 ENCOUNTER — Ambulatory Visit: Payer: Medicaid Other | Admitting: Adult Health

## 2022-12-17 ENCOUNTER — Other Ambulatory Visit: Payer: Self-pay

## 2022-12-17 MED ORDER — LEVETIRACETAM 500 MG PO TABS: 500.0000 mg | ORAL_TABLET | Freq: Two times a day (BID) | ORAL | 10 refills | Status: DC

## 2023-01-08 ENCOUNTER — Other Ambulatory Visit: Payer: Self-pay | Admitting: Family Medicine

## 2023-01-08 DIAGNOSIS — R110 Nausea: Secondary | ICD-10-CM

## 2023-01-27 ENCOUNTER — Other Ambulatory Visit: Payer: Self-pay | Admitting: Family Medicine

## 2023-01-27 DIAGNOSIS — R110 Nausea: Secondary | ICD-10-CM

## 2023-02-07 ENCOUNTER — Other Ambulatory Visit: Payer: Self-pay | Admitting: Family Medicine

## 2023-02-18 ENCOUNTER — Other Ambulatory Visit: Payer: Self-pay | Admitting: Family Medicine

## 2023-02-18 DIAGNOSIS — J4489 Other specified chronic obstructive pulmonary disease: Secondary | ICD-10-CM

## 2023-04-02 ENCOUNTER — Other Ambulatory Visit: Payer: Self-pay

## 2023-04-02 DIAGNOSIS — R110 Nausea: Secondary | ICD-10-CM

## 2023-04-02 MED ORDER — ONDANSETRON HCL 4 MG PO TABS: 4.0000 mg | ORAL_TABLET | Freq: Three times a day (TID) | ORAL | 0 refills | Status: DC | PRN

## 2023-04-22 ENCOUNTER — Other Ambulatory Visit: Payer: Self-pay | Admitting: Family Medicine

## 2023-04-22 DIAGNOSIS — R110 Nausea: Secondary | ICD-10-CM

## 2023-05-15 ENCOUNTER — Other Ambulatory Visit: Payer: Self-pay | Admitting: Family Medicine

## 2023-05-15 DIAGNOSIS — R110 Nausea: Secondary | ICD-10-CM

## 2023-05-18 NOTE — Telephone Encounter (Signed)
Appointment made via daughter. Penni Bombard CMA

## 2023-05-31 ENCOUNTER — Other Ambulatory Visit: Payer: Self-pay | Admitting: Family Medicine

## 2023-05-31 DIAGNOSIS — R110 Nausea: Secondary | ICD-10-CM

## 2023-06-02 ENCOUNTER — Other Ambulatory Visit: Payer: Self-pay | Admitting: *Deleted

## 2023-06-02 MED ORDER — AMLODIPINE BESYLATE 5 MG PO TABS: 5.0000 mg | ORAL_TABLET | Freq: Every day | ORAL | 0 refills | Status: DC

## 2023-06-25 ENCOUNTER — Ambulatory Visit: Payer: 59 | Admitting: Family Medicine

## 2023-07-09 ENCOUNTER — Other Ambulatory Visit: Payer: Self-pay | Admitting: Family Medicine

## 2023-07-09 DIAGNOSIS — R110 Nausea: Secondary | ICD-10-CM

## 2023-07-10 ENCOUNTER — Other Ambulatory Visit: Payer: Self-pay

## 2023-07-10 DIAGNOSIS — J4489 Other specified chronic obstructive pulmonary disease: Secondary | ICD-10-CM

## 2023-07-10 MED ORDER — TRELEGY ELLIPTA 100-62.5-25 MCG/ACT IN AEPB: 1.0000 | INHALATION_SPRAY | Freq: Every day | RESPIRATORY_TRACT | 1 refills | Status: DC

## 2023-07-27 ENCOUNTER — Other Ambulatory Visit: Payer: Self-pay | Admitting: Family Medicine

## 2023-07-27 DIAGNOSIS — R110 Nausea: Secondary | ICD-10-CM

## 2023-08-31 ENCOUNTER — Other Ambulatory Visit: Payer: Self-pay | Admitting: Family Medicine

## 2023-09-05 ENCOUNTER — Other Ambulatory Visit: Payer: Self-pay | Admitting: Family Medicine

## 2023-09-05 DIAGNOSIS — R110 Nausea: Secondary | ICD-10-CM

## 2023-09-24 ENCOUNTER — Other Ambulatory Visit: Payer: Self-pay | Admitting: Family Medicine

## 2023-09-24 DIAGNOSIS — R110 Nausea: Secondary | ICD-10-CM

## 2023-10-16 ENCOUNTER — Other Ambulatory Visit: Payer: Self-pay | Admitting: Family Medicine

## 2023-10-16 DIAGNOSIS — R110 Nausea: Secondary | ICD-10-CM

## 2023-10-16 MED ORDER — ONDANSETRON HCL 4 MG PO TABS: 4.0000 mg | ORAL_TABLET | Freq: Three times a day (TID) | ORAL | 0 refills | Status: DC | PRN

## 2023-10-16 NOTE — Addendum Note (Signed)
Addended byPenne Lash on: 10/16/2023 01:40 PM   Modules accepted: Orders

## 2023-11-11 ENCOUNTER — Other Ambulatory Visit: Payer: Self-pay | Admitting: Family Medicine

## 2023-11-11 DIAGNOSIS — J4489 Other specified chronic obstructive pulmonary disease: Secondary | ICD-10-CM

## 2023-11-18 ENCOUNTER — Other Ambulatory Visit: Payer: Self-pay | Admitting: Family Medicine

## 2023-11-18 DIAGNOSIS — R110 Nausea: Secondary | ICD-10-CM

## 2023-12-03 ENCOUNTER — Ambulatory Visit: Payer: Medicaid Other | Admitting: Neurology

## 2023-12-03 ENCOUNTER — Encounter: Payer: Self-pay | Admitting: Neurology

## 2023-12-17 ENCOUNTER — Other Ambulatory Visit: Payer: Self-pay | Admitting: Family Medicine

## 2023-12-17 DIAGNOSIS — R110 Nausea: Secondary | ICD-10-CM

## 2023-12-23 ENCOUNTER — Emergency Department (HOSPITAL_COMMUNITY)

## 2023-12-23 ENCOUNTER — Emergency Department (HOSPITAL_COMMUNITY)
Admission: EM | Admit: 2023-12-23 | Discharge: 2023-12-23 | Disposition: A | Discharge status: Home / Self Care | Attending: Emergency Medicine | Admitting: Emergency Medicine

## 2023-12-23 ENCOUNTER — Encounter (HOSPITAL_COMMUNITY): Payer: Self-pay

## 2023-12-23 ENCOUNTER — Other Ambulatory Visit: Payer: Self-pay

## 2023-12-23 DIAGNOSIS — I6782 Cerebral ischemia: Secondary | ICD-10-CM | POA: Insufficient documentation

## 2023-12-23 DIAGNOSIS — Z79899 Other long term (current) drug therapy: Secondary | ICD-10-CM | POA: Diagnosis not present

## 2023-12-23 DIAGNOSIS — Z7982 Long term (current) use of aspirin: Secondary | ICD-10-CM | POA: Insufficient documentation

## 2023-12-23 DIAGNOSIS — Z8673 Personal history of transient ischemic attack (TIA), and cerebral infarction without residual deficits: Secondary | ICD-10-CM | POA: Diagnosis not present

## 2023-12-23 DIAGNOSIS — G40909 Epilepsy, unspecified, not intractable, without status epilepticus: Secondary | ICD-10-CM | POA: Diagnosis not present

## 2023-12-23 DIAGNOSIS — F17210 Nicotine dependence, cigarettes, uncomplicated: Secondary | ICD-10-CM | POA: Diagnosis not present

## 2023-12-23 DIAGNOSIS — I10 Essential (primary) hypertension: Secondary | ICD-10-CM | POA: Insufficient documentation

## 2023-12-23 DIAGNOSIS — R29810 Facial weakness: Secondary | ICD-10-CM | POA: Insufficient documentation

## 2023-12-23 DIAGNOSIS — R4701 Aphasia: Secondary | ICD-10-CM | POA: Diagnosis not present

## 2023-12-23 DIAGNOSIS — J449 Chronic obstructive pulmonary disease, unspecified: Secondary | ICD-10-CM | POA: Diagnosis not present

## 2023-12-23 DIAGNOSIS — J328 Other chronic sinusitis: Secondary | ICD-10-CM | POA: Insufficient documentation

## 2023-12-23 DIAGNOSIS — R569 Unspecified convulsions: Secondary | ICD-10-CM | POA: Diagnosis not present

## 2023-12-23 LAB — DIFFERENTIAL
Abs Immature Granulocytes: 0.01 10*3/uL (ref 0.00–0.07)
Basophils Absolute: 0 10*3/uL (ref 0.0–0.1)
Basophils Relative: 1 %
Eosinophils Absolute: 0.1 10*3/uL (ref 0.0–0.5)
Eosinophils Relative: 1 %
Immature Granulocytes: 0 %
Lymphocytes Relative: 30 %
Lymphs Abs: 1.9 10*3/uL (ref 0.7–4.0)
Monocytes Absolute: 0.3 10*3/uL (ref 0.1–1.0)
Monocytes Relative: 5 %
Neutro Abs: 4 10*3/uL (ref 1.7–7.7)
Neutrophils Relative %: 63 %

## 2023-12-23 LAB — COMPREHENSIVE METABOLIC PANEL WITH GFR
ALT: 12 U/L (ref 0–44)
AST: 18 U/L (ref 15–41)
Albumin: 3.5 g/dL (ref 3.5–5.0)
Alkaline Phosphatase: 95 U/L (ref 38–126)
Anion gap: 8 (ref 5–15)
BUN: 7 mg/dL — ABNORMAL LOW (ref 8–23)
CO2: 25 mmol/L (ref 22–32)
Calcium: 9.1 mg/dL (ref 8.9–10.3)
Chloride: 107 mmol/L (ref 98–111)
Creatinine, Ser: 1.06 mg/dL — ABNORMAL HIGH (ref 0.44–1.00)
GFR, Estimated: 57 mL/min — ABNORMAL LOW (ref >60)
Glucose, Bld: 105 mg/dL — ABNORMAL HIGH (ref 70–99)
Potassium: 3.8 mmol/L (ref 3.5–5.1)
Sodium: 140 mmol/L (ref 135–145)
Total Bilirubin: 0.6 mg/dL (ref 0.0–1.2)
Total Protein: 6.5 g/dL (ref 6.5–8.1)

## 2023-12-23 LAB — CBC
HCT: 38.2 % (ref 36.0–46.0)
Hemoglobin: 12.3 g/dL (ref 12.0–15.0)
MCH: 31.1 pg (ref 26.0–34.0)
MCHC: 32.2 g/dL (ref 30.0–36.0)
MCV: 96.5 fL (ref 80.0–100.0)
Platelets: 225 10*3/uL (ref 150–400)
RBC: 3.96 MIL/uL (ref 3.87–5.11)
RDW: 14.3 % (ref 11.5–15.5)
WBC: 6.3 10*3/uL (ref 4.0–10.5)
nRBC: 0 % (ref 0.0–0.2)

## 2023-12-23 LAB — I-STAT CHEM 8, ED
BUN: 9 mg/dL (ref 8–23)
Calcium, Ion: 1.05 mmol/L — ABNORMAL LOW (ref 1.15–1.40)
Chloride: 108 mmol/L (ref 98–111)
Creatinine, Ser: 1.1 mg/dL — ABNORMAL HIGH (ref 0.44–1.00)
Glucose, Bld: 113 mg/dL — ABNORMAL HIGH (ref 70–99)
HCT: 39 % (ref 36.0–46.0)
Hemoglobin: 13.3 g/dL (ref 12.0–15.0)
Potassium: 5.4 mmol/L — ABNORMAL HIGH (ref 3.5–5.1)
Sodium: 139 mmol/L (ref 135–145)
TCO2: 26 mmol/L (ref 22–32)

## 2023-12-23 LAB — PROTIME-INR
INR: 1.1 (ref 0.8–1.2)
Prothrombin Time: 14.3 s (ref 11.4–15.2)

## 2023-12-23 LAB — ETHANOL: Alcohol, Ethyl (B): <15 mg/dL (ref <15)

## 2023-12-23 LAB — CBG MONITORING, ED: Glucose-Capillary: 107 mg/dL — ABNORMAL HIGH (ref 70–99)

## 2023-12-23 LAB — APTT: aPTT: 33 s (ref 24–36)

## 2023-12-23 MED ORDER — ACETAMINOPHEN 325 MG PO TABS
975.0000 mg | ORAL_TABLET | Freq: Once | ORAL | Status: AC
Start: 2023-12-23 — End: 2023-12-23
  Administered 2023-12-23: 975 mg via ORAL
  Filled 2023-12-23: qty 3

## 2023-12-23 MED ORDER — LEVETIRACETAM 500 MG PO TABS: 1000.0000 mg | ORAL_TABLET | Freq: Two times a day (BID) | ORAL | 3 refills | Status: DC

## 2023-12-23 MED ORDER — ONDANSETRON HCL 4 MG/2ML IJ SOLN
4.0000 mg | Freq: Once | INTRAMUSCULAR | Status: AC
  Administered 2023-12-23: 4 mg via INTRAVENOUS
  Filled 2023-12-23: qty 2

## 2023-12-23 MED ORDER — LEVETIRACETAM 500 MG PO TABS: 750.0000 mg | ORAL_TABLET | Freq: Two times a day (BID) | ORAL | Status: DC

## 2023-12-23 MED ORDER — LEVETIRACETAM IN NACL 1500 MG/100ML IV SOLN: 1500.0000 mg | Freq: Once | INTRAVENOUS | Status: DC

## 2023-12-23 MED ORDER — LEVETIRACETAM 500 MG PO TABS: 1000.0000 mg | ORAL_TABLET | Freq: Two times a day (BID) | ORAL | 0 refills | Status: DC

## 2023-12-23 MED ORDER — LEVETIRACETAM IN NACL 1500 MG/100ML IV SOLN
1500.0000 mg | Freq: Once | INTRAVENOUS | Status: AC
  Administered 2023-12-23: 1500 mg via INTRAVENOUS
  Filled 2023-12-23: qty 100

## 2023-12-23 MED ORDER — LEVETIRACETAM 500 MG PO TABS: 1250.0000 mg | ORAL_TABLET | Freq: Two times a day (BID) | ORAL | Status: DC

## 2023-12-23 NOTE — Consult Note (Signed)
 NEUROLOGY CONSULT NOTE   Date of service: December 23, 2023 Patient Name: [name] MRN:  [id number] DOB:  1950-09-26 Chief Complaint: stroke code Requesting Provider: Sallyanne Creamer, DO  History of Present Illness   [name] is a 73 y.o. female with hx of seizure disorder on keppra  f/b Dr. Samara Crest at [facility], COPD, remote ischemic stroke, HL, HTN BIB EMS as code stroke for expressive aphasia and R facial droop.  Last known well was at 6 AM this morning.  Around lunchtime family heard her fall out of bed and went to check on her and she was not able to speak and had a right facial droop.  Stroke NIH stroke scale was 7.  She had expressive aphasia, right facial droop, dysarthria, and did not blink to threat on the right.  CT head showed no acute intracranial process.  Patient was not a candidate for TNK secondary to presentation outside the window.  Patient was rapidly improving during the stroke code and by the end was slightly confused but was able to name 4 of 6 objects and tell us  that she felt like she probably had a seizure.  Outpatient neurology notes reviewed.  She was last seen by Dr. Samara Crest in April of last year. His note from 12/01/22 recommends increasing keppra  to 1000mg  bid from 750mg  bid after she had a breakthrough seizure.   LKW: 0600 Modified rankin score: 3-Moderate disability-requires help but walks WITHOUT assistance IV Thrombolysis: No outside window  NIHSS components Score: Comment  1a Level of Conscious 0[]  1[]  2[]  3[]      1b LOC Questions 0[]  1[]  2[x]       1c LOC Commands 0[]  1[]  2[]       2 Best Gaze 0[]  1[]  2[]       3 Visual 0[]  1[]  2[x]  3[]      4 Facial Palsy 0[]  1[x]  2[]  3[]      5a Motor Arm - left 0[]  1[]  2[]  3[]  4[]  UN[]    5b Motor Arm - Right 0[]  1[]  2[]  3[]  4[]  UN[]    6a Motor Leg - Left 0[]  1[]  2[]  3[]  4[]  UN[]    6b Motor Leg - Right 0[]  1[]  2[]  3[]  4[]  UN[]    7 Limb Ataxia 0[]  1[]  2[]  3[]  UN[]     8 Sensory 0[]  1[]  2[]  UN[]      9 Best Language 0[]  1[x]   2[]  3[]      10 Dysarthria 0[]  1[x]  2[]  UN[]      11 Extinct. and Inattention 0[]  1[]  2[]       TOTAL:  7   Any unchecked boxes represent a score of 0    ROS  Unable to ascertain due to AMS  Past History   Past Medical History:  Diagnosis Date   Abdominal pain, epigastric 02/23/2013   Abdominal pain, unspecified site 08/15/2013   Abnormal neurological exam 04/11/2016   Acute CVA (cerebrovascular accident) (HCC) 11/03/2019   Acute non-recurrent frontal sinusitis 05/21/2018   Asthma    Cataracts, bilateral 05/03/2014   Cephalalgia    Chronic bronchitis (HCC)    Chronic lower back pain    COPD (chronic obstructive pulmonary disease) (HCC)    CVA (cerebral vascular accident) (HCC) 11/04/2019   Daily headache    De Quervain's tenosynovitis, left 11/09/2014   Essential hypertension 10/08/2021   GERD (gastroesophageal reflux disease)    Hx of colonic polyps    Maxillary sinusitis    Nausea & vomiting    Orthopnea 09/28/2015   Reports of 5 pillow orthopnea (from 2-3)  which began with cold symptoms. Could be due to cold but would like to rule out cardiac etiology. Also noted of 1 year history of dyspnea on exertion which only worsens when she gets a cold; this could be due to COPD however cardiac etiology should be considered. No JVD or LE edema on exam.  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   Hx of Rt vertebral artery occlusion    Past Surgical History:  Procedure Laterality Date   ABDOMINAL AORTOGRAM W/LOWER EXTREMITY N/A 10/06/2016   Procedure: Abdominal Aortogram w/Lower Extremity;  Surgeon: Adine Hoof, MD;  Location: [facility] INVASIVE CV LAB;  Service: Cardiovascular;  Laterality: N/A;   ABDOMINAL AORTOGRAM W/LOWER EXTREMITY N/A 02/10/2018   Procedure: ABDOMINAL  AORTOGRAM W/LOWER EXTREMITY;  Surgeon: Adine Hoof, MD;  Location: [facility] INVASIVE CV LAB;  Service: Cardiovascular;  Laterality: N/A;   ABDOMINAL HERNIA REPAIR     APPENDECTOMY     COLONOSCOPY W/ POLYPECTOMY  02/26/2005   FEMORAL-POPLITEAL BYPASS GRAFT Left 04/20/2018   Procedure: Left FEMORAL-POPLITEAL ARTERY Bypass Graft using Gore Propaten Vascular Graft;  Surgeon: Adine Hoof, MD;  Location: [facility];  Service: Vascular;  Laterality: Left;   HERNIA REPAIR     LOWER EXTREMITY ANGIOGRAPHY Bilateral 04/19/2018   Procedure: LOWER EXTREMITY ANGIOGRAPHY;  Surgeon: Adine Hoof, MD;  Location: [facility] INVASIVE CV LAB;  Service: Cardiovascular;  Laterality: Bilateral;   PERIPHERAL VASCULAR ATHERECTOMY Right 10/06/2016   Procedure: Peripheral Vascular Atherectomy;  Surgeon: Adine Hoof, MD;  Location: [facility] INVASIVE CV LAB;  Service: Cardiovascular;  Laterality: Right;   PERIPHERAL VASCULAR INTERVENTION Right 10/06/2016   Procedure: Peripheral Vascular Intervention;  Surgeon: Adine Hoof, MD;  Location: [facility] INVASIVE CV LAB;  Service: Cardiovascular;  Laterality: Right;  SFA   PERIPHERAL VASCULAR INTERVENTION Left 02/10/2018   Procedure: PERIPHERAL VASCULAR INTERVENTION;  Surgeon: Adine Hoof, MD;  Location: [facility] INVASIVE CV LAB;  Service: Cardiovascular;  Laterality: Left;   TONSILLECTOMY  1960s   UPPER GASTROINTESTINAL ENDOSCOPY  03/22/2010, 02/13/2005   VAGINAL HYSTERECTOMY     partial    Family History: Family History  Problem Relation Age of Onset   Cancer Other    Coronary artery disease Other    Cancer Mother    Hypertension Mother    Heart disease Mother    Cancer Father    Hypertension Father    Cancer Sister    Hypertension Sister     Social History  reports that she has been smoking cigarettes. She has a 23.5 pack-year smoking history. She has never used smokeless tobacco. She reports that she does not currently use  alcohol. She reports current drug use. Drug: Marijuana.  No Known Allergies  Medications   Current Facility-Administered Medications:    [COMPLETED] levETIRAcetam  (KEPPRA ) IVPB 1500 mg/ 100 mL premix, 1,500 mg, Intravenous, Once, Stopped at 12/23/23 1737 **FOLLOWED BY** levETIRAcetam  (KEPPRA ) tablet 1,250 mg, 1,250 mg, Oral, BID, Eleni Griffin, MD  Current Outpatient Medications:    albuterol  (VENTOLIN  HFA) 108 (90 Base) MCG/ACT inhaler, INHALE 2 PUFFS INTO THE LUNGS EVERY 4 HOURS AS NEEDED FOR WHEEZING OR SHORTNESS OF BREATH, Disp: 18 g, Rfl: 1   amLODipine  (NORVASC ) 5 MG tablet, TAKE 1 TABLET(5 MG) BY  MOUTH DAILY, Disp: 90 tablet, Rfl: 3   aspirin  EC 81 MG tablet, Take 1 tablet (81 mg total) by mouth daily., Disp: 90 tablet, Rfl: 3   atorvastatin  (LIPITOR ) 80 MG tablet, Take 1 tablet (80 mg total) by mouth daily at 6 PM., Disp: 90 tablet, Rfl: 3   capsaicin  (ZOSTRIX) 0.025 % cream, Apply topically 2 (two) times daily., Disp: 60 g, Rfl: 0   feeding supplement (ENSURE ENLIVE / ENSURE PLUS) LIQD, Take 237 mLs by mouth 3 (three) times daily between meals., Disp: 237 mL, Rfl: 12   levETIRAcetam  (KEPPRA ) 500 MG tablet, Take 1 tablet (500 mg total) by mouth 2 (two) times daily., Disp: 60 tablet, Rfl: 10   metoprolol  tartrate (LOPRESSOR ) 25 MG tablet, Take 1 tablet (25 mg total) by mouth 2 (two) times daily., Disp: 60 tablet, Rfl: 0   nystatin  (MYCOSTATIN /NYSTOP ) powder, Apply 1 application topically 3 (three) times daily., Disp: 15 g, Rfl: 0   ondansetron  (ZOFRAN ) 4 MG tablet, TAKE 1 TABLET(4 MG) BY MOUTH EVERY 8 HOURS AS NEEDED, Disp: 20 tablet, Rfl: 0   pantoprazole  (PROTONIX ) 40 MG tablet, TAKE 1 TABLET(40 MG) BY MOUTH DAILY, Disp: 90 tablet, Rfl: 0   traMADol  (ULTRAM ) 50 MG tablet, Take 1 tablet (50 mg total) by mouth at bedtime as needed (pain)., Disp: 5 tablet, Rfl: 0   TRELEGY ELLIPTA  100-62.5-25 MCG/ACT AEPB, INHALE 1 PUFF INTO THE LUNGS DAILY, Disp: 60 each, Rfl: 1  Vitals   Vitals:    01/01/24 1500 Jan 01, 2024 1504 01-Jan-2024 1741 2024/01/01 1800  BP: 130/67  130/69 (!) 142/68  Pulse: 73  70 68  Resp: 18  16 19   Temp: (!) 97.3 F (36.3 C)     TempSrc: Temporal     SpO2: 94%  96% 95%  Weight:  49.4 kg    Height:  5' 5.5" (1.664 m)      Body mass index is 17.85 kg/m.  Physical Exam   Gen: patient lying in bed, NAD CV: extremities appear well-perfused Resp: normal WOB  Neurologic Examination   MS: alert, oriented x1, follows commands Speech: mild dysarthria moderate aphasia CN: PERRL, no blink to threat on R, EOMI, sensation intact, R facial droop, hearing intact to voice Motor: no drift in any extremity Sensory: SILT Reflexes: 2+ symm with toes down bilat Coordination: UTA Gait: deferred  Labs/Imaging/Neurodiagnostic studies   CBC:  Recent Labs  Lab 01/01/24 1446 01/01/2024 1515  WBC  --  6.3  NEUTROABS  --  4.0  HGB 13.3 12.3  HCT 39.0 38.2  MCV  --  96.5  PLT  --  225   Basic Metabolic Panel:  Lab Results  Component Value Date   NA 140 01-01-2024   K 3.8 01-Jan-2024   CO2 25 01/01/2024   GLUCOSE 105 (H) 01-Jan-2024   BUN 7 (L) 2024-01-01   CREATININE 1.06 (H) Jan 01, 2024   CALCIUM  9.1 2024-01-01   GFRNONAA 57 (L) 01-01-2024   GFRAA 74 09/12/2020   Lipid Panel:  Lab Results  Component Value Date   LDLCALC NOT CALCULATED 10/08/2021   HgbA1c:  Lab Results  Component Value Date   HGBA1C 6.1 (H) 10/08/2021   Urine Drug Screen:     Component Value Date/Time   LABOPIA NONE DETECTED Dec 31, 2021 0857   COCAINSCRNUR NONE DETECTED 12/31/21 0857   LABBENZ NONE DETECTED 12/31/2021 0857   AMPHETMU NONE DETECTED Dec 31, 2021 0857   THCU POSITIVE (A) 2021/12/31 0857   LABBARB NONE DETECTED 2021/12/31 0857  Alcohol Level     Component Value Date/Time   [facility] <15 12/23/2023 1515   INR  Lab Results  Component Value Date   INR 1.1 12/23/2023   APTT  Lab Results  Component Value Date   APTT 33 12/23/2023   AED levels:  Lab Results   Component Value Date   LEVETIRACETA 57.8 (H) 12/01/2022    CT Head without contrast(Personally reviewed): 1.  No evidence of an acute intracranial abnormality. 2. Parenchymal atrophy, chronic small vessel ischemic disease and chronic infarcts, as described. 3. Paranasal sinus disease as outlined.   ASSESSMENT   [name] is a 73 y.o. female with hx of seizure disorder on keppra  f/b Dr. Samara Crest at [facility], COPD, remote ischemic stroke, HL, HTN BIB EMS as code stroke for expressive aphasia and R facial droop which rapidly improved over the first 20 minutes in ED and was favored to be 2/2 post-ictal state following breakthrough seizure  RECOMMENDATIONS   - Will need to determine what dose of keppra  patient was taking (500mg  bid vs 1000mg  bid), will obtain more history after her mental status improves and make recommendations for dose adjustment at that time. For now administer 1.5g keppra  IV - MRI brain wo contrast r/o acute ischemic stroke or other acute finding precipitating breakthrough seizure - Observe in ED for return to baseline  Greg Leaks, MD [facility] [phone]  If 7pm- 7am, please page neurology on call as listed in AMION.   Addendum 1934:   MRI brain wo showed no evidence of acute infarct or other acute intracranial abnormality.   Per EDP, patient continues to improve from mental status and standpoint and was able to tell him that she is taking one pill of keppra  bid. She does not know how many mg that pill is  Fill history reviewed with pharmacist; she has only filled 500mg  one tab bid for the past year. so it appears that Dr. Samara Crest recommended that she increase to 1000mg  bid but she was only taking 500mg  bid. S/p 1.5g keppra  load in ED and recommend increasing maint dose to 1000mg  bid. I elected to increase from 500mg  to 1000mg  bid instead of an intermediate dose bc per his clinic note she previously had breakthrough seizures on 750mg  bid.   No  indication for admission if patient is back to baseline.  No indication for EEG since she has known seizure disorder.  Recommend outpatient f/u with her established epileptologist Dr. Samara Crest.  Neurology to sign off but please re-engage if additional neurologic concerns arise. ______________________________________________________________________    Signed, Eleni Griffin, MD [facility]

## 2023-12-23 NOTE — ED Triage Notes (Signed)
 Pt BIB [facility] from home d/t family hearing her fall, unknown if she had a seizure, does have Hx of them. LKN 0600 when family last saw her, LVO 3 while en route to ED d/t expressive aphasia & Rt sided facial droop. 112/64, 93% on RA, 74 bpm, resp 20, CBG 124. Family does report she has been having a cough with some congestion. Does take Keppra , last known seizure was approx. 7 months ago.

## 2023-12-23 NOTE — Code Documentation (Signed)
 Stroke Response Nurse Documentation Code Documentation  Alexsis Fiebelkorn is a 73 y.o. female arriving to [facility]  via [facility] on 12/23/2023 with past medical hx of COPD, seizure, nicotine  abuse, GERD, headache, stroke 2021, hypercholesterolemia, and hypertension. On aspirin  81 mg daily. Code stroke was activated by EMS.   Patient from home where she was LKW at 0600 and family heard her fall. EMS reports expressive aphasia and right sided facial droop.  Stroke team at the bedside on patient arrival. Patient to CT with team. NIHSS 5, see documentation for details and code stroke times. Patient with right hemianopia, right facial droop, Expressive aphasia , and dysarthria  on exam. The following imaging was completed:  CT Head. Patient is not a candidate for IV Thrombolytic due to out of the window. Patient is not a candidate for IR due to VAN negative and symptoms improving.   Care Plan: Q 2 hour VS and NIHSS.   Process Delays Noted: no IV access on arrival; difficulty getting IV and labs  Bedside handoff with ED RN.    Felicitas Horse  Stroke Response RN

## 2023-12-23 NOTE — ED Provider Notes (Signed)
 [place] EMERGENCY DEPARTMENT AT [facility] Provider Note   CSN: [id number] Arrival date & time: 12/23/23  1430     History  Chief Complaint  Patient presents with   Code Stroke    [name] is a 73 y.o. female.  With a history of seizure disorder, COPD and stroke who presents to ED as code stroke.  Last known well time at 0600.  Patient lives at home with family and family reported hearing her fall this morning.  She did have some expressive aphasia and right-sided facial droop prior to arrival.  She remained postictal upon her initial evaluation and unable to provide additional history at this time.  Takes Keppra  500 mg twice daily and is followed by [facility] with neurology.  Last seizure was 7 months ago.  Reports compliance with Keppra   HPI     Home Medications Prior to Admission medications   Medication Sig Start Date End Date Taking? Authorizing Provider  albuterol  (VENTOLIN  HFA) 108 (90 Base) MCG/ACT inhaler INHALE 2 PUFFS INTO THE LUNGS EVERY 4 HOURS AS NEEDED FOR WHEEZING OR SHORTNESS OF BREATH 05/07/21   Cassandra Cleveland, MD  amLODipine  (NORVASC ) 5 MG tablet TAKE 1 TABLET(5 MG) BY MOUTH DAILY 08/31/23   Clyda Dark, DO  aspirin  EC 81 MG tablet Take 1 tablet (81 mg total) by mouth daily. 11/08/19   Ronna Coho, MD  atorvastatin  (LIPITOR ) 80 MG tablet Take 1 tablet (80 mg total) by mouth daily at 6 PM. 04/07/22   Joelle Musca, MD  capsaicin  (ZOSTRIX) 0.025 % cream Apply topically 2 (two) times daily. 10/09/21   Genora Kidd, MD  feeding supplement (ENSURE ENLIVE / ENSURE PLUS) LIQD Take 237 mLs by mouth 3 (three) times daily between meals. 11/11/21   Paige, Victoria J, DO  levETIRAcetam  (KEPPRA ) 500 MG tablet Take 2 tablets (1,000 mg total) by mouth 2 (two) times daily. 12/23/23   Sallyanne Creamer, DO  metoprolol  tartrate (LOPRESSOR ) 25 MG tablet Take 1 tablet (25 mg total) by mouth 2 (two) times daily. 10/09/21   Genora Kidd, MD  nystatin   (MYCOSTATIN /NYSTOP ) powder Apply 1 application topically 3 (three) times daily. 04/19/21   Santana Cue, MD  ondansetron  (ZOFRAN ) 4 MG tablet TAKE 1 TABLET(4 MG) BY MOUTH EVERY 8 HOURS AS NEEDED 12/17/23   Baloch, Mahnoor, MD  pantoprazole  (PROTONIX ) 40 MG tablet TAKE 1 TABLET(40 MG) BY MOUTH DAILY 10/09/21   Germain Kohler, Victoria J, DO  traMADol  (ULTRAM ) 50 MG tablet Take 1 tablet (50 mg total) by mouth at bedtime as needed (pain). 07/06/22   Ann Keto, MD  TRELEGY ELLIPTA  100-62.5-25 MCG/ACT AEPB INHALE 1 PUFF INTO THE LUNGS DAILY 11/12/23   Farris Hong, MD      Allergies    Patient has no known allergies.    Review of Systems   Review of Systems  Physical Exam Updated Vital Signs BP 127/69   Pulse 61   Temp 97.8 F (36.6 C) (Oral)   Resp 19   Ht 5' 5.5" (1.664 m)   Wt 49.4 kg   SpO2 94%   BMI 17.85 kg/m  Physical Exam Vitals and nursing note reviewed.  HENT:     Head: Normocephalic and atraumatic.  Eyes:     Pupils: Pupils are equal, round, and reactive to light.  Cardiovascular:     Rate and Rhythm: Normal rate and regular rhythm.  Pulmonary:     Effort: Pulmonary effort is normal.     Breath sounds:  Normal breath sounds.  Abdominal:     Palpations: Abdomen is soft.     Tenderness: There is no abdominal tenderness.  Skin:    General: Skin is warm and dry.  Neurological:     General: No focal deficit present.     Mental Status: She is alert.     Sensory: No sensory deficit.     Motor: No weakness.  Psychiatric:        Mood and Affect: Mood normal.     ED Results / Procedures / Treatments   Labs (all labs ordered are listed, but only abnormal results are displayed) Labs Reviewed  COMPREHENSIVE METABOLIC PANEL WITH GFR - Abnormal; Notable for the following components:      Result Value   Glucose, Bld 105 (*)    BUN 7 (*)    Creatinine, Ser 1.06 (*)    GFR, Estimated 57 (*)    All other components within normal limits  I-STAT CHEM 8, ED - Abnormal;  Notable for the following components:   Potassium 5.4 (*)    Creatinine, Ser 1.10 (*)    Glucose, Bld 113 (*)    Calcium , Ion 1.05 (*)    All other components within normal limits  CBG MONITORING, ED - Abnormal; Notable for the following components:   Glucose-Capillary 107 (*)    All other components within normal limits  ETHANOL  PROTIME-INR  APTT  CBC  DIFFERENTIAL  RAPID URINE DRUG SCREEN, HOSP PERFORMED  URINALYSIS, ROUTINE W REFLEX MICROSCOPIC    EKG EKG Interpretation Date/Time:  Wednesday December 23 2023 14:55:26 EDT Ventricular Rate:  63 PR Interval:  138 QRS Duration:  98 QT Interval:  403 QTC Calculation: 413 R Axis:   76  Text Interpretation: Sinus rhythm RSR' in V1 or V2, right VCD or RVH Confirmed by Rafael Bun [phone]) on 12/23/2023 11:18:23 PM  Radiology MR BRAIN WO CONTRAST Result Date: 12/23/2023 CLINICAL DATA:  Provided history: Headache, neuro deficit. EXAM: MRI HEAD WITHOUT CONTRAST TECHNIQUE: Multiplanar, multiecho pulse sequences of the brain and surrounding structures were obtained without intravenous contrast. COMPARISON:  Non-contrast head CT performed earlier today 12/23/2023. Brain MRI 04/10/2021. FINDINGS: Brain: Mild-to-moderate generalized cerebral atrophy. Small chronic cortical/subcortical infarct again demonstrated within the right occipital lobe. Multifocal T2 FLAIR hyperintense signal abnormality elsewhere within the cerebral white matter, nonspecific but compatible with moderate chronic small vessel ischemic disease. Unchanged chronic infarcts within the bilateral cerebellar hemispheres. 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 There is no acute infarct. No extra-axial fluid collection. No midline shift. Vascular: Maintained flow voids within the proximal large arterial vessels. Skull and upper cervical spine: No focal worrisome marrow lesion. Sinuses/Orbits: No mass or acute finding within the imaged orbits. Severe right maxillary  sinusitis. Mild mucosal thickening within the right sphenoid sinus. Moderate right ethmoid and right frontal sinusitis. Other: 12 mm ovoid T2 hyperintense lesion within the deep lobe of the right parotid gland, unchanged in size as compared to the brain MRI of 04/10/2021. IMPRESSION: 1. No evidence of an acute intracranial abnormality. 2. Known chronic infarcts within the right occipital lobe and bilateral cerebellar hemispheres. 3. Background moderate cerebral white matter chronic small vessel ischemic disease, similar to the prior brain MRI of 04/10/2021. 4. Mild-to-moderate generalized cerebral atrophy. 5. 12 mm lesion within the deep lobe of the right parotid gland, unchanged from the prior MRI. This may reflect a cyst or a primary parotid neoplasm. 6. Paranasal sinus disease as described. Electronically Signed   By:  Bascom Lily D.O.   On: 12/23/2023 16:58   DG Ankle Complete Right Result Date: 12/23/2023 CLINICAL DATA:  Right ankle and foot pain after fall today. EXAM: RIGHT ANKLE - COMPLETE 3+ VIEW; RIGHT FOOT COMPLETE - 3+ VIEW COMPARISON:  Right foot radiographs 11/06/2008 FINDINGS: Mildly decreased bone mineralization. Right ankle: The ankle mortise is symmetric and intact. Mild chronic enthesopathic change at the Achilles tendon insertion on the calcaneus. No acute fracture is seen. No dislocation. Right foot: Unchanged fusion of the fifth DIP joint. Minimal peripheral degenerative spurring at the great toe metatarsophalangeal joint. Moderate great toe metatarsal head-plantar sesamoid joint space narrowing and peripheral osteophytosis. Mild navicular-cuneiform joint space narrowing with moderate dorsal osteophytosis/spurring. A prior ossicle in this region now that appears fused to the adjacent navicular. No acute fracture is seen.  No dislocation. IMPRESSION: 1. No acute fracture. 2. 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  Electronically Signed   By: Bertina Broccoli M.D.   On: 12/23/2023 15:57   DG Foot Complete Right Result Date: 12/23/2023 CLINICAL DATA:  Right ankle and foot pain after fall today. EXAM: RIGHT ANKLE - COMPLETE 3+ VIEW; RIGHT FOOT COMPLETE - 3+ VIEW COMPARISON:  Right foot radiographs 11/06/2008 FINDINGS: Mildly decreased bone mineralization. Right ankle: The ankle mortise is symmetric and intact. Mild chronic enthesopathic change at the Achilles tendon insertion on the calcaneus. No acute fracture is seen. No dislocation. Right foot: Unchanged fusion of the fifth DIP joint. Minimal peripheral degenerative spurring at the great toe metatarsophalangeal joint. Moderate great toe metatarsal head-plantar sesamoid joint space narrowing and peripheral osteophytosis. Mild navicular-cuneiform joint space narrowing with moderate dorsal osteophytosis/spurring. A prior ossicle in this region now that appears fused to the adjacent navicular. No acute fracture is seen.  No dislocation. IMPRESSION: 1. No acute fracture. 2. 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 Electronically Signed   By: Bertina Broccoli M.D.   On: 12/23/2023 15:57   CT HEAD CODE STROKE WO CONTRAST Result Date: 12/23/2023 CLINICAL DATA:  Code stroke. Provided history: Neuro deficit, acute, stroke suspected. Additional history provided: Aphasia, right-sided facial droop. EXAM: CT HEAD WITHOUT CONTRAST TECHNIQUE: Contiguous axial images were obtained from the base of the skull through the vertex without intravenous contrast. RADIATION DOSE REDUCTION: This exam was performed according to the departmental dose-optimization program which includes automated exposure control, adjustment of the mA and/or kV according to patient size and/or use of iterative reconstruction technique. COMPARISON:  Head CT 12/22/2021. FINDINGS: Brain: Generalized cerebral atrophy. Chronic cortical/subcortical infarct  again demonstrated within the right occipital lobe. Background mild patchy and ill-defined hypoattenuation within the cerebral white matter, nonspecific but compatible chronic small vessel ischemic disease. Chronic infarcts again demonstrated within the bilateral cerebellar hemispheres. There is no acute intracranial hemorrhage. No acute demarcated cortical infarct. No extra-axial fluid collection. No evidence of an intracranial mass. No midline shift. Vascular: No hyperdense vessel.  Atherosclerotic calcifications. Skull: No calvarial fracture or aggressive osseous lesion. Sinuses/Orbits: No mass or acute finding within the imaged orbits. Severe right maxillary sinusitis. Moderate right ethmoid and right frontal sinusitis. ASPECTS [facility] Stroke Program Early CT Score) - Ganglionic level infarction (caudate, lentiform nuclei, internal capsule, insula, M1-M3 cortex): 7 - Supraganglionic infarction (M4-M6 cortex): 3 Total score (0-10 with 10 being normal): 10 (when discounting chronic infarcts). No evidence of an acute intracranial abnormality. These results were communicated to Dr. Doretta Gant At 3:03 pmon 4/23/2025by text page via the [facility] messaging system. IMPRESSION:  1.  No evidence of an acute intracranial abnormality. 2. Parenchymal atrophy, chronic small vessel ischemic disease and chronic infarcts, as described. 3. Paranasal sinus disease as outlined. Electronically Signed   By: Bascom Lily D.O.   On: 12/23/2023 15:03    Procedures Procedures    Medications Ordered in ED Medications  levETIRAcetam  (KEPPRA ) IVPB 1500 mg/ 100 mL premix (0 mg Intravenous Stopped 12/23/23 1737)    Followed by  levETIRAcetam  (KEPPRA ) tablet 1,250 mg (has no administration in time range)  acetaminophen  (TYLENOL ) tablet 975 mg (975 mg Oral Given 12/23/23 1819)  ondansetron  (ZOFRAN ) injection 4 mg (4 mg Intravenous Given 12/23/23 1814)    ED Course/ Medical Decision Making/ A&P Clinical Course as of 12/23/23 2321  Wed Dec 23, 2023  1955 MRI brain shows no acute stroke or other findings that would merit admission.  Discussed with neurology Dr. Doretta Gant who recommends increasing her Keppra  dose to 1000 mg twice daily and outpatient neurology follow-up [MP]    Clinical Course User Index [MP] Sallyanne Creamer, DO                                 Medical Decision Making 73 year old female with history as above presenting given concern for potential stroke.  Last known well time at 0600.  Family heard her fall and found her to exhibit aphasia and for this reason code stroke was activated.  Patient examined along with Dr. Doretta Gant (neurology).  Initial stroke scale score of 7 with Dr. Doretta Gant.  CT head unremarkable.  IV Keppra  given.  Will obtain laboratory workup to look for evidence of electrolyte imbalance or underlying metabolic cause along with MRI to rule out stroke.  Based on initial impression, Dr. Doretta Gant feels the patient may be appropriate for discharge if MRI is negative with plan to increase her Keppra  dosing  Amount and/or Complexity of Data Reviewed Labs: ordered. Radiology: ordered.  Risk OTC drugs. Prescription drug management.           Final Clinical Impression(s) / ED Diagnoses Final diagnoses:  Seizure (HCC)    Rx / DC Orders ED Discharge Orders          Ordered    levETIRAcetam  (KEPPRA ) 500 MG tablet  2 times daily,   Status:  Discontinued        12/23/23 2006    levETIRAcetam  (KEPPRA ) 500 MG tablet  2 times daily        12/23/23 2013              Sallyanne Creamer, DO 12/23/23 2321

## 2023-12-23 NOTE — Discharge Instructions (Addendum)
 You were seen in the emerged part after seizure Your blood work and imaging looked okay You were seen by our neurologist here who recommends increasing your Keppra  dosage with the hope of preventing future seizures We have increased your dose of Keppra  to 2 tablets twice daily (1000 mg twice daily) Pick up this new prescription and begin taking as directed tomorrow morning It is important that you follow-up with your neurologist Dr. Samara Crest within the next week for reevaluation to discuss today seizure and your Keppra  dosing Return to the emergency room for repeated seizures, headaches, weakness on one side of your body or any other concerns

## 2023-12-28 ENCOUNTER — Telehealth: Payer: Self-pay | Admitting: Neurology

## 2023-12-28 NOTE — Telephone Encounter (Addendum)
 Pt last seen 12/01/22 by Dr. Samara Crest. Next f/u 07/18/24. No showed 12/03/23 appt. Dx: Seizures. Per last labs, pt decreased Keppra  to 500 mg twice daily     She was seen at ED 12/23/23 for seizure activity. Keppra  dose increased to Keppra  2 tablets twice daily (1000 mg twice daily). I called daughter and LVM.  Phone room: If daughter calls, please offer appt 12/29/23 at 10:15a with Dr. Samara Crest. She needs appt post ED visit to be evaluated/discuss treatment plan moving forward.

## 2023-12-28 NOTE — Telephone Encounter (Signed)
 Pt's daughter is asking for a call to discuss pt's Rx for  levETIRAcetam  (KEPPRA ) 500 MG tablet and anything else pt is on for her seizure activity which has increased recently.

## 2023-12-28 NOTE — Telephone Encounter (Signed)
 Pt's daughter called back and accepted an appt for 12/29/23 in the am with Dr. Samara Crest.

## 2023-12-29 ENCOUNTER — Ambulatory Visit (INDEPENDENT_AMBULATORY_CARE_PROVIDER_SITE_OTHER): Admitting: Neurology

## 2023-12-29 ENCOUNTER — Encounter: Payer: Self-pay | Admitting: Neurology

## 2023-12-29 VITALS — BP 107/68 | HR 66 | Ht 66.5 in | Wt 113.0 lb

## 2023-12-29 DIAGNOSIS — Z5181 Encounter for therapeutic drug level monitoring: Secondary | ICD-10-CM

## 2023-12-29 DIAGNOSIS — G40009 Localization-related (focal) (partial) idiopathic epilepsy and epileptic syndromes with seizures of localized onset, not intractable, without status epilepticus: Secondary | ICD-10-CM

## 2023-12-29 MED ORDER — LEVETIRACETAM 1000 MG PO TABS: 1000.0000 mg | ORAL_TABLET | Freq: Two times a day (BID) | ORAL | 3 refills | Status: AC

## 2023-12-29 MED ORDER — MIRTAZAPINE 7.5 MG PO TABS: 7.5000 mg | ORAL_TABLET | Freq: Every day | ORAL | 0 refills | Status: DC

## 2023-12-29 NOTE — Patient Instructions (Signed)
 Continue with Levetiracetam  1000 mg twice daily, Will check levetiracetam  level Will check routine EEG If there is additional seizures despite levetiracetam  1000 mg twice daily, will add Depakote  Will also give patient a trial of Remeron to help with sleep and possibly increased appetite. Follow-up in 6 months or sooner if worse.

## 2023-12-29 NOTE — Progress Notes (Signed)
 [facility]  PATIENT: [name] DOB: 1950-11-05  REFERRING CLINICIAN: Baloch, Mahnoor, MD HISTORY FROM: Patient  REASON FOR VISIT: Seizure follow up   HISTORICAL  CHIEF COMPLAINT:  Chief Complaint  Patient presents with   Follow-up    Pt in 13, here alone  Pt is here following up on seizures. Pt states she had a seizure on 12/25/23 and was seen at the ED. Last seizure before that was 3 weeks prior, states she is compliant with her medication and does not feel back to normal yet.     INTERVAL HISTORY 12/29/2023: Patient presented for follow-up, last visit was a year ago.  At that time plan was to continue with levetiracetam  1000 mg twice daily but she has been taking it 500 mg twice daily. Family tells me that she continued to have breakthrough seizures.  She will wake up on the floor, sometime with urinary incontinence.  Her last documented seizure was on April 25 where she was found on the floor unresponsive, and confused.  She was taken to the hospital, MRI negative for any acute stroke, patient was thought to have a seizure.  Keppra  level was not checked but she was recommended to continue with Keppra  1000 mg twice daily but she has been taking 500 mg twice daily.   INTERVAL HISTORY 41/2024:  Patient presents today for follow-up, last visit was a year ago.  Since then she has been doing well on Keppra  500 mg twice daily.  She reported March 26, she believes she had a seizure, she was very confused and had weakness.  This event was not with witnessed by anyone. She reports compliance with the Keppra .    INTERVAL HISTORY 11/05/2021: Patient presents today for follow-up.  At last visit in September plan was to continue with Keppra  500 mg twice daily.  Keppra  level has been checked, was 26.4.  She report compliance with medication, denies any side effect.  He did follow with his primary care doctor regarding the weight loss and is pending GI evaluation.  Patient also  reports she is doing physical therapy.     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  Patient presented to the clinic by herself, reports that she does not remember the event.  Reports that the only thing that she remembers is waking up from the hospital.  She said that family had told her that she had a seizure at the house and in the hospital.  She said that her daughter had told her that she woke up in the middle of the night, screaming and then she fell and started foaming at the mouth.  She never had a seizure event in her life.  This was the first lifetime event. Seizure risk factor include a history of stroke and grand son with seizures.  In the hospital she was loaded with Keppra , she had a brain MRI which showed her previous stroke and EEG which was normal.  She was continued on Keppra  500 mg twice daily and discharged home.  Patient stated since discharge she has been doing well, denies any seizure or seizure-like activity.  She is compliant with the Keppra  500 mg twice daily and denies any side effect from the medication.  She currently lives at home with her daughter and grandkids.  She does not drive and she is able to  manage all daily activity.  She manages her defined her finances by her self.   Other issues brought today is her weight loss patient stated for the past 6 months she lost about 40 pounds and she is worried and concerned about that.  She has not followed up with her primary care doctor.     Handedness: Right handed   Seizure Type: Generalized convulsion  Current frequency: continue to have breakthrough seizures, unclear frequency but last documented was April 25/2025   Any injuries from seizures: No  Seizure risk factors: Grand daughter with seizure, strokes  Previous ASMs: None   Currenty ASMs:  Levetiracetam  1000 mg BID  ASMs side effects: None   Brain Images: MRI Brain 04/10/2021 1.  No acute intracranial abnormality. 2. Small area of developing encephalomalacia with hemosiderin in the right superior occipital lobe corresponds to the recent CT finding. 3. Underlying chronic cerebellar infarcts with evidence of chronically poor flow or occlusion in the distal right vertebral artery. 4. Chronic cerebral white matter disease is stable since last year.  Previous EEGs: rEEG 04/10/2021: This study is within normal limits. No seizures or epileptiform discharges were seen throughout the recording.   OTHER MEDICAL CONDITIONS: Stroke, HTN, HLD, COPD  REVIEW OF SYSTEMS: Full 14 system review of systems performed and negative with exception of: as noted in the HPI  ALLERGIES: No Known Allergies  HOME MEDICATIONS: Outpatient Medications Prior to Visit  Medication Sig Dispense Refill   albuterol  (VENTOLIN  HFA) 108 (90 Base) MCG/ACT inhaler INHALE 2 PUFFS INTO THE LUNGS EVERY 4 HOURS AS NEEDED FOR WHEEZING OR SHORTNESS OF BREATH 18 g 1   amLODipine  (NORVASC ) 5 MG tablet TAKE 1 TABLET(5 MG) BY MOUTH DAILY 90 tablet 3   aspirin  EC 81 MG tablet Take 1 tablet (81 mg total) by mouth daily. 90 tablet 3   atorvastatin  (LIPITOR ) 80 MG tablet Take 1 tablet (80 mg total) by mouth daily at 6 PM. 90 tablet 3   capsaicin  (ZOSTRIX) 0.025 % cream Apply topically 2 (two) times daily. 60 g 0   feeding supplement (ENSURE ENLIVE / ENSURE PLUS) LIQD Take 237 mLs by mouth 3 (three) times daily between meals. 237 mL 12   metoprolol  tartrate (LOPRESSOR ) 25 MG tablet Take 1 tablet (25 mg total) by mouth 2 (two) times daily. 60 tablet 0   nystatin  (MYCOSTATIN /NYSTOP ) powder Apply 1 application topically 3 (three) times daily. 15 g 0   ondansetron  (ZOFRAN ) 4 MG tablet TAKE 1 TABLET(4 MG) BY MOUTH EVERY 8 HOURS AS NEEDED 20 tablet 0   pantoprazole  (PROTONIX ) 40 MG tablet TAKE 1 TABLET(40 MG) BY MOUTH DAILY 90 tablet  0   traMADol  (ULTRAM ) 50 MG tablet Take 1 tablet (50 mg total) by mouth at bedtime as needed (pain). 5 tablet 0   TRELEGY ELLIPTA  100-62.5-25 MCG/ACT AEPB INHALE 1 PUFF INTO THE LUNGS DAILY 60 each 1   levETIRAcetam  (KEPPRA ) 500 MG tablet Take 2 tablets (1,000 mg total) by mouth 2 (two) times daily. 120 tablet 3   No facility-administered medications prior to visit.    PAST MEDICAL HISTORY: Past Medical History:  Diagnosis Date   Abdominal pain, epigastric 02/23/2013   Abdominal pain, unspecified site 08/15/2013   Abnormal neurological exam 04/11/2016   Acute CVA (cerebrovascular accident) (HCC) 11/03/2019   Acute non-recurrent frontal sinusitis 05/21/2018   Asthma    Cataracts, bilateral 05/03/2014   Cephalalgia    Chronic bronchitis (HCC)    Chronic lower back pain  COPD (chronic obstructive pulmonary disease) (HCC)    CVA (cerebral vascular accident) (HCC) 11/04/2019   Daily headache    De Quervain's tenosynovitis, left 11/09/2014   Essential hypertension 10/08/2021   GERD (gastroesophageal reflux disease)    Hx of colonic polyps    Maxillary sinusitis    Nausea & vomiting    Orthopnea 09/28/2015   Reports of 5 pillow orthopnea (from 2-3) which began with cold symptoms. Could be due to cold but would like to rule out cardiac etiology. Also noted of 1 year history of dyspnea on exertion which only worsens when she gets a cold; this could be due to COPD however cardiac etiology should be considered. No JVD or LE edema on exam.  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   Hx of Rt vertebral artery occlusion    PAST SURGICAL HISTORY: Past Surgical History:  Procedure Laterality Date   ABDOMINAL AORTOGRAM W/LOWER EXTREMITY N/A 10/06/2016   Procedure: Abdominal Aortogram  w/Lower Extremity;  Surgeon: Adine Hoof, MD;  Location[facility] INVASIVE CV LAB;  Service: Cardiovascular;  Laterality: N/A;   ABDOMINAL AORTOGRAM W/LOWER EXTREMITY N/A 02/10/2018   Procedure: ABDOMINAL AORTOGRAM W/LOWER EXTREMITY;  Surgeon: Adine Hoof, MD;  Location: [facility] INVASIVE CV LAB;  Service: Cardiovascular;  Laterality: N/A;   ABDOMINAL HERNIA REPAIR     APPENDECTOMY     COLONOSCOPY W/ POLYPECTOMY  02/26/2005   FEMORAL-POPLITEAL BYPASS GRAFT Left 04/20/2018   Procedure: Left FEMORAL-POPLITEAL ARTERY Bypass Graft using Gore Propaten Vascular Graft;  Surgeon: Adine Hoof, MD;  Location: [facility] OR;  Service: Vascular;  Laterality: Left;   HERNIA REPAIR     LOWER EXTREMITY ANGIOGRAPHY Bilateral 04/19/2018   Procedure: LOWER EXTREMITY ANGIOGRAPHY;  Surgeon: Adine Hoof, MD;  Location: [facility] INVASIVE CV LAB;  Service: Cardiovascular;  Laterality: Bilateral;   PERIPHERAL VASCULAR ATHERECTOMY Right 10/06/2016   Procedure: Peripheral Vascular Atherectomy;  Surgeon: Adine Hoof, MD;  Location: [facility] INVASIVE CV LAB;  Service: Cardiovascular;  Laterality: Right;   PERIPHERAL VASCULAR INTERVENTION Right 10/06/2016   Procedure: Peripheral Vascular Intervention;  Surgeon: Adine Hoof, MD;  Location: [facility] INVASIVE CV LAB;  Service: Cardiovascular;  Laterality: Right;  SFA   PERIPHERAL VASCULAR INTERVENTION Left 02/10/2018   Procedure: PERIPHERAL VASCULAR INTERVENTION;  Surgeon: Adine Hoof, MD;  Location: [facility] INVASIVE CV LAB;  Service: Cardiovascular;  Laterality: Left;   TONSILLECTOMY  1960s   UPPER GASTROINTESTINAL ENDOSCOPY  03/22/2010, 02/13/2005   VAGINAL HYSTERECTOMY     partial    FAMILY HISTORY: Family History  Problem Relation Age of Onset   Cancer Other    Coronary artery disease Other    Cancer Mother    Hypertension Mother    Heart disease Mother    Cancer Father    Hypertension Father    Cancer Sister     Hypertension Sister     SOCIAL HISTORY: Social History   Socioeconomic History   Marital status: Single    Spouse name: Not on file   Number of children: 2   Years of education: 12   Highest education level: High school graduate  Occupational History   Not on file  Tobacco Use   Smoking status: Every Day  Current packs/day: 0.50    Average packs/day: 0.5 packs/day for 47.0 years (23.5 ttl pk-yrs)    Types: Cigarettes   Smokeless tobacco: Never   Tobacco comments:    3-4 cigarettes per day  Vaping Use   Vaping status: Never Used  Substance and Sexual Activity   Alcohol use: Not Currently    Alcohol/week: 0.0 standard drinks of alcohol    Comment: Quit alcohol in 1992; "never really drank much alcohol"   Drug use: Yes    Types: Marijuana    Comment: 02/10/2018 "~ twice//wk"   Sexual activity: Not Currently  Other Topics Concern   Not on file  Social History Narrative   Patient lives alone in [place].    Patient has two adult children nearby.    Patient has never been married.    Patient enjoys riding motorcycles and she is part of a "biker club."   Patient enjoys shopping and going to sporting events.   Social Drivers of Corporate investment banker Strain: Low Risk  (08/16/2020)   Overall Financial Resource Strain (CARDIA)    Difficulty of Paying Living Expenses: Not hard at all  Food Insecurity: No Food Insecurity (08/16/2020)   Hunger Vital Sign    Worried About Running Out of Food in the Last Year: Never true    Ran Out of Food in the Last Year: Never true  Transportation Needs: No Transportation Needs (08/16/2020)   PRAPARE - Administrator, Civil Service (Medical): No    Lack of Transportation (Non-Medical): No  Physical Activity: Inactive (08/16/2020)   Exercise Vital Sign    Days of Exercise per Week: 0 days    Minutes of Exercise per Session: 0 min  Stress: No Stress Concern Present (08/16/2020)   [employer] of Occupational Health  - Occupational Stress Questionnaire    Feeling of Stress : Only a little  Social Connections: Moderately Integrated (08/16/2020)   Social Connection and Isolation Panel [NHANES]    Frequency of Communication with Friends and Family: More than three times a week    Frequency of Social Gatherings with Friends and Family: More than three times a week    Attends Religious Services: More than 4 times per year    Active Member of [employer] or Organizations: Yes    Attends Banker Meetings: 1 to 4 times per year    Marital Status: Never married  Intimate Partner Violence: Not At Risk (08/16/2020)   Humiliation, Afraid, Rape, and Kick questionnaire    Fear of Current or Ex-Partner: No    Emotionally Abused: No    Physically Abused: No    Sexually Abused: No     PHYSICAL EXAM  GENERAL EXAM/CONSTITUTIONAL: Vitals:  Vitals:   12/29/23 1020  BP: 107/68  Pulse: 66  Weight: 113 lb (51.3 kg)  Height: 5' 6.5" (1.689 m)   Body mass index is 17.97 kg/m. Wt Readings from Last 3 Encounters:  12/29/23 113 lb (51.3 kg)  12/23/23 108 lb 14.5 oz (49.4 kg)  12/01/22 113 lb (51.3 kg)   Patient is in no distress; well developed, nourished and groomed; neck is supple, thin woman  MUSCULOSKELETAL: Gait, strength, tone, movements noted in Neurologic exam below  NEUROLOGIC: MENTAL STATUS: awake, alert, oriented to person, place and time recent and remote memory intact normal attention and concentration language fluent, comprehension intact, naming intact fund of knowledge appropriate  CRANIAL NERVE:  2nd, 3rd, 4th, 6th - visual fields full to confrontation, extraocular  muscles intact, no nystagmus 5th - facial sensation symmetric 7th - facial strength symmetric 8th - hearing intact 9th - palate elevates symmetrically, uvula midline 11th - shoulder shrug symmetric 12th - tongue protrusion midline  MOTOR:  normal bulk and tone, full strength in the BUE, BLE  SENSORY:  normal  and symmetric to light touch  COORDINATION:  finger-nose-finger, fine finger movements normal  GAIT/STATION:  normal    DIAGNOSTIC DATA (LABS, IMAGING, TESTING) - I reviewed patient records, labs, notes, testing and imaging myself where available.  Lab Results  Component Value Date   WBC 6.3 12/23/2023   HGB 12.3 12/23/2023   HCT 38.2 12/23/2023   MCV 96.5 12/23/2023   PLT 225 12/23/2023      Component Value Date/Time   NA 140 12/23/2023 1515   NA 148 (H) 10/25/2021 1540   K 3.8 12/23/2023 1515   CL 107 12/23/2023 1515   CO2 25 12/23/2023 1515   GLUCOSE 105 (H) 12/23/2023 1515   BUN 7 (L) 12/23/2023 1515   BUN 11 10/25/2021 1540   CREATININE 1.06 (H) 12/23/2023 1515   CREATININE 0.79 08/15/2013 1446   CALCIUM  9.1 12/23/2023 1515   PROT 6.5 12/23/2023 1515   PROT 6.5 10/25/2021 1540   ALBUMIN  3.5 12/23/2023 1515   ALBUMIN  4.5 10/25/2021 1540   AST 18 12/23/2023 1515   ALT 12 12/23/2023 1515   ALKPHOS 95 12/23/2023 1515   BILITOT 0.6 12/23/2023 1515   BILITOT 0.3 10/25/2021 1540   GFRNONAA 57 (L) 12/23/2023 1515   GFRAA 74 09/12/2020 1456   Lab Results  Component Value Date   CHOL 114 10/08/2021   HDL 41 10/08/2021   LDLCALC NOT CALCULATED 10/08/2021   TRIG 51 10/08/2021   Lab Results  Component Value Date   HGBA1C 6.1 (H) 10/08/2021   Lab Results  Component Value Date   VITAMINB12 439 04/10/2021   Lab Results  Component Value Date   TSH 0.762 10/07/2021    MRI Brain 12/23/2023 1. No evidence of an acute intracranial abnormality. 2. Known chronic infarcts within the right occipital lobe and bilateral cerebellar hemispheres. 3. Background moderate cerebral white matter chronic small vessel ischemic disease, similar to the prior brain MRI of 04/10/2021. 4. Mild-to-moderate generalized cerebral atrophy. 5. 12 mm lesion within the deep lobe of the right parotid gland, unchanged from the prior MRI. This may reflect a cyst or a primary parotid  neoplasm. 6. Paranasal sinus disease as described.   Brain MRI 04/10/2021 1.  No acute intracranial abnormality. 2. Small area of developing encephalomalacia with hemosiderin in the right superior occipital lobe corresponds to the recent CT finding. 3. Underlying chronic cerebellar infarcts with evidence of chronically poor flow or occlusion in the distal right vertebral artery. 4. Chronic cerebral white matter disease is stable since last year.   Routine EEG 04/10/2021 This study is within normal limits. No seizures or epileptiform discharges were seen throughout the recording.  I personally reviewed brain Images and previous EEG reports.   ASSESSMENT AND PLAN  73 y.o. year old female  with past medical history of stroke secondary to right vertebral artery occlusion, hypertension, hyperlipidemia, COPD, seizure who is presenting for follow-up for her seizure disorder.  She continues to have breakthrough seizures, has been taking levetiracetam  500 mg twice daily when the recommended dose was 1000 mg twice daily.  Plan is to continue with the 1000 mg twice daily, I will check levetiracetam  level and also check routine EEG.  Again discussed  with family that plan for now is to continue with levetiracetam  1000 mg twice daily if she continues to have seizures despite the 1000 mg, will add Depakote  500 mg twice daily.  Family is also reporting poor sleep, poor appetite, will give patient a trial of Remeron nightly.  I will see him in 6 months for follow-up or sooner if worse.    1. Therapeutic drug monitoring   2. Partial idiopathic epilepsy with seizures of localized onset, not intractable, without status epilepticus (HCC)       Patient Instructions  Continue with Levetiracetam  1000 mg twice daily, Will check levetiracetam  level Will check routine EEG If there is additional seizures despite levetiracetam  1000 mg twice daily, will add Depakote  Will also give patient a trial of Remeron to help  with sleep and possibly increased appetite. Follow-up in 6 months or sooner if worse.   Per [place]  DMV statutes, patients with seizures are not allowed to drive until they have been seizure-free for six months.  Other recommendations include using caution when using heavy equipment or power tools. Avoid working on ladders or at heights. Take showers instead of baths.  Do not swim alone.  Ensure the water temperature is not too high on the home water heater. Do not go swimming alone. Do not lock yourself in a room alone (i.e. bathroom). When caring for infants or small children, sit down when holding, feeding, or changing them to minimize risk of injury to the child in the event you have a seizure. Maintain good sleep hygiene. Avoid alcohol.  Also recommend adequate sleep, hydration, good diet and minimize stress.   During the Seizure  - First, ensure adequate ventilation and place patients on the floor on their left side  Loosen clothing around the neck and ensure the airway is patent. If the patient is clenching the teeth, do not force the mouth open with any object as this can cause severe damage - Remove all items from the surrounding that can be hazardous. The patient may be oblivious to what's happening and may not even know what he or she is doing. If the patient is confused and wandering, either gently guide him/her away and block access to outside areas - Reassure the individual and be comforting - Call 911. In most cases, the seizure ends before EMS arrives. However, there are cases when seizures may last over 3 to 5 minutes. Or the individual may have developed breathing difficulties or severe injuries. If a pregnant patient or a person with diabetes develops a seizure, it is prudent to call an ambulance. - Finally, if the patient does not regain full consciousness, then call EMS. Most patients will remain confused for about 45 to 90 minutes after a seizure, so you must use judgment  in calling for help. - Avoid restraints but make sure the patient is in a bed with padded side rails - Place the individual in a lateral position with the neck slightly flexed; this will help the saliva drain from the mouth and prevent the tongue from falling backward - Remove all nearby furniture and other hazards from the area - Provide verbal assurance as the individual is regaining consciousness - Provide the patient with privacy if possible - Call for help and start treatment as ordered by the caregiver   After the Seizure (Postictal Stage)  After a seizure, most patients experience confusion, fatigue, muscle pain and/or a headache. Thus, one should permit the individual to sleep. For the next few  days, reassurance is essential. Being calm and helping reorient the person is also of importance.  Most seizures are painless and end spontaneously. Seizures are not harmful to others but can lead to complications such as stress on the lungs, brain and the heart. Individuals with prior lung problems may develop labored breathing and respiratory distress.     Orders Placed This Encounter  Procedures   Levetiracetam  level   EEG adult    Meds ordered this encounter  Medications   levETIRAcetam  (KEPPRA ) 1000 MG tablet    Sig: Take 1 tablet (1,000 mg total) by mouth 2 (two) times daily.    Dispense:  180 tablet    Refill:  3   mirtazapine (REMERON) 7.5 MG tablet    Sig: Take 1 tablet (7.5 mg total) by mouth at bedtime.    Dispense:  30 tablet    Refill:  0    Return in about 6 months (around 06/29/2024).    Cassandra Cleveland, MD 12/29/2023, 11:13 AM  [facility] [address] [place], [place] [address] [phone]

## 2023-12-29 NOTE — Procedures (Signed)
    History:  73 year old woman with seizure disorder   EEG classification: Awake and drowsy  Duration: 26 minutes   Technical aspects: This EEG study was done with scalp electrodes positioned according to the 10-20 International system of electrode placement. Electrical activity was reviewed with band pass filter of 1-70Hz , sensitivity of 7 uV/mm, display speed of 35mm/sec with a 60Hz  notched filter applied as appropriate. EEG data were recorded continuously and digitally stored.   Description of the recording: The background rhythms of this recording consists of a fairly well modulated medium amplitude of 7-8 Hz at best. Photic stimulation was performed, did not show any abnormalities. Hyperventilation was not performed. Drowsiness was not seen. There were occasional left frontotemporal sharp and slow wave discharge. There was focal left temporal slowing. There were mild diffuse slowing.  There were no electrographic seizure identified.   Abnormality:  Left frontotemporal sharp and slow wave  Left temporal focal slowing  Mild diffuse slowing   Impression: This is an abnormal awake EEG due to presence of left frontotemporal sharp and slow wave and focal slowing which is consistent with an area of increase epileptogenic potential in the left temporal region. There was also mild diffuse slowing which is consistent with a generalized brain dysfunction, such as encephalopathy, nonspecific etiology.     Lyman Balingit, MD [facility]

## 2023-12-30 LAB — LEVETIRACETAM LEVEL: Levetiracetam Lvl: 24.2 ug/mL (ref 10.0–40.0)

## 2023-12-31 ENCOUNTER — Other Ambulatory Visit: Payer: Self-pay

## 2023-12-31 DIAGNOSIS — J4489 Other specified chronic obstructive pulmonary disease: Secondary | ICD-10-CM

## 2023-12-31 NOTE — Progress Notes (Signed)
 Please call and advise the patient that the recent Levetiracetam  level we checked was within normal limits. No further action is required on these tests at this time. Please remind patient to keep any upcoming appointments or tests and to call us  with any interim questions, concerns, problems or updates. Thanks,   Cassandra Cleveland, MD

## 2023-12-31 NOTE — Progress Notes (Signed)
 Please call and inform patient that her recent EEG showed slowing in the left area of her brain. This finding can be seen in patient with seizure disorder. Continue current medications, keep any upcoming appointments or tests and  call us  with any interim questions, concerns, problems or updates. Thanks,   Cassandra Cleveland, MD

## 2024-01-01 MED ORDER — TRELEGY ELLIPTA 100-62.5-25 MCG/ACT IN AEPB: 1.0000 | INHALATION_SPRAY | Freq: Every day | RESPIRATORY_TRACT | 1 refills | Status: DC

## 2024-01-02 ENCOUNTER — Other Ambulatory Visit: Payer: Self-pay | Admitting: Family Medicine

## 2024-01-02 DIAGNOSIS — R110 Nausea: Secondary | ICD-10-CM

## 2024-02-05 ENCOUNTER — Other Ambulatory Visit: Payer: Self-pay | Admitting: Family Medicine

## 2024-02-05 DIAGNOSIS — R110 Nausea: Secondary | ICD-10-CM

## 2024-02-23 ENCOUNTER — Other Ambulatory Visit: Payer: Self-pay | Admitting: Neurology

## 2024-02-23 ENCOUNTER — Other Ambulatory Visit: Payer: Self-pay | Admitting: Family Medicine

## 2024-02-23 DIAGNOSIS — R110 Nausea: Secondary | ICD-10-CM

## 2024-02-23 DIAGNOSIS — J4489 Other specified chronic obstructive pulmonary disease: Secondary | ICD-10-CM

## 2024-03-29 ENCOUNTER — Other Ambulatory Visit: Payer: Self-pay | Admitting: Family Medicine

## 2024-03-29 DIAGNOSIS — R110 Nausea: Secondary | ICD-10-CM

## 2024-04-22 ENCOUNTER — Other Ambulatory Visit: Payer: Self-pay | Admitting: Family Medicine

## 2024-04-22 DIAGNOSIS — R110 Nausea: Secondary | ICD-10-CM

## 2024-05-19 ENCOUNTER — Other Ambulatory Visit: Payer: Self-pay | Admitting: Neurology

## 2024-06-19 ENCOUNTER — Emergency Department (HOSPITAL_COMMUNITY)
Admission: EM | Admit: 2024-06-19 | Discharge: 2024-06-20 | Disposition: A | Discharge status: Home / Self Care | Attending: Emergency Medicine | Admitting: Emergency Medicine

## 2024-06-19 ENCOUNTER — Encounter (HOSPITAL_COMMUNITY): Payer: Self-pay

## 2024-06-19 ENCOUNTER — Other Ambulatory Visit: Payer: Self-pay

## 2024-06-19 ENCOUNTER — Emergency Department (HOSPITAL_COMMUNITY)

## 2024-06-19 DIAGNOSIS — Z7982 Long term (current) use of aspirin: Secondary | ICD-10-CM | POA: Diagnosis not present

## 2024-06-19 DIAGNOSIS — I10 Essential (primary) hypertension: Secondary | ICD-10-CM | POA: Diagnosis not present

## 2024-06-19 DIAGNOSIS — Z23 Encounter for immunization: Secondary | ICD-10-CM | POA: Diagnosis not present

## 2024-06-19 DIAGNOSIS — Y93G3 Activity, cooking and baking: Secondary | ICD-10-CM | POA: Insufficient documentation

## 2024-06-19 DIAGNOSIS — R569 Unspecified convulsions: Secondary | ICD-10-CM | POA: Insufficient documentation

## 2024-06-19 DIAGNOSIS — T310 Burns involving less than 10% of body surface: Secondary | ICD-10-CM | POA: Diagnosis not present

## 2024-06-19 DIAGNOSIS — T25222A Burn of second degree of left foot, initial encounter: Secondary | ICD-10-CM | POA: Diagnosis not present

## 2024-06-19 DIAGNOSIS — I6782 Cerebral ischemia: Secondary | ICD-10-CM | POA: Diagnosis not present

## 2024-06-19 DIAGNOSIS — T2020XA Burn of second degree of head, face, and neck, unspecified site, initial encounter: Secondary | ICD-10-CM | POA: Diagnosis not present

## 2024-06-19 DIAGNOSIS — X101XXA Contact with hot food, initial encounter: Secondary | ICD-10-CM | POA: Insufficient documentation

## 2024-06-19 DIAGNOSIS — Z79899 Other long term (current) drug therapy: Secondary | ICD-10-CM | POA: Insufficient documentation

## 2024-06-19 DIAGNOSIS — J45909 Unspecified asthma, uncomplicated: Secondary | ICD-10-CM | POA: Diagnosis not present

## 2024-06-19 LAB — CBC WITH DIFFERENTIAL/PLATELET
Abs Immature Granulocytes: 0.03 K/uL (ref 0.00–0.07)
Basophils Absolute: 0.1 K/uL (ref 0.0–0.1)
Basophils Relative: 1 %
Eosinophils Absolute: 0.1 K/uL (ref 0.0–0.5)
Eosinophils Relative: 1 %
HCT: 35.6 % — ABNORMAL LOW (ref 36.0–46.0)
Hemoglobin: 11.2 g/dL — ABNORMAL LOW (ref 12.0–15.0)
Immature Granulocytes: 0 %
Lymphocytes Relative: 35 %
Lymphs Abs: 3.6 K/uL (ref 0.7–4.0)
MCH: 30.9 pg (ref 26.0–34.0)
MCHC: 31.5 g/dL (ref 30.0–36.0)
MCV: 98.3 fL (ref 80.0–100.0)
Monocytes Absolute: 0.5 K/uL (ref 0.1–1.0)
Monocytes Relative: 5 %
Neutro Abs: 6 K/uL (ref 1.7–7.7)
Neutrophils Relative %: 58 %
Platelets: 236 K/uL (ref 150–400)
RBC: 3.62 MIL/uL — ABNORMAL LOW (ref 3.87–5.11)
RDW: 14.7 % (ref 11.5–15.5)
WBC: 10.2 K/uL (ref 4.0–10.5)
nRBC: 0 % (ref 0.0–0.2)

## 2024-06-19 LAB — COMPREHENSIVE METABOLIC PANEL WITH GFR
ALT: 9 U/L (ref 0–44)
AST: 17 U/L (ref 15–41)
Albumin: 3.8 g/dL (ref 3.5–5.0)
Alkaline Phosphatase: 84 U/L (ref 38–126)
Anion gap: 12 (ref 5–15)
BUN: 15 mg/dL (ref 8–23)
CO2: 25 mmol/L (ref 22–32)
Calcium: 9.1 mg/dL (ref 8.9–10.3)
Chloride: 100 mmol/L (ref 98–111)
Creatinine, Ser: 1.17 mg/dL — ABNORMAL HIGH (ref 0.44–1.00)
GFR, Estimated: 50 mL/min — ABNORMAL LOW (ref >60)
Glucose, Bld: 102 mg/dL — ABNORMAL HIGH (ref 70–99)
Potassium: 3.9 mmol/L (ref 3.5–5.1)
Sodium: 137 mmol/L (ref 135–145)
Total Bilirubin: 0.5 mg/dL (ref 0.0–1.2)
Total Protein: 7.1 g/dL (ref 6.5–8.1)

## 2024-06-19 MED ORDER — TETANUS-DIPHTH-ACELL PERTUSSIS 5-2-15.5 LF-MCG/0.5 IM SUSP
0.5000 mL | Freq: Once | INTRAMUSCULAR | Status: AC
  Administered 2024-06-19: 0.5 mL via INTRAMUSCULAR
  Filled 2024-06-19: qty 0.5
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
## 2024-06-20 DIAGNOSIS — R569 Unspecified convulsions: Secondary | ICD-10-CM | POA: Diagnosis not present

## 2024-06-20 LAB — URINALYSIS, W/ REFLEX TO CULTURE (INFECTION SUSPECTED)
Bilirubin Urine: NEGATIVE
Glucose, UA: NEGATIVE mg/dL
Hgb urine dipstick: NEGATIVE
Ketones, ur: NEGATIVE mg/dL
Leukocytes,Ua: NEGATIVE
Nitrite: NEGATIVE
Protein, ur: NEGATIVE mg/dL
Specific Gravity, Urine: 1.006 (ref 1.005–1.030)
pH: 7 (ref 5.0–8.0)

## 2024-06-20 MED ORDER — ACETAMINOPHEN 325 MG PO TABS: 650.0000 mg | ORAL_TABLET | Freq: Four times a day (QID) | ORAL | 0 refills | Status: AC | PRN

## 2024-06-20 MED ORDER — ACETAMINOPHEN 325 MG PO TABS
650.0000 mg | ORAL_TABLET | Freq: Once | ORAL | Status: AC
  Administered 2024-06-20: 650 mg via ORAL
  Filled 2024-06-20: qty 2

## 2024-06-20 MED ORDER — BACITRACIN ZINC 500 UNIT/GM EX OINT
TOPICAL_OINTMENT | Freq: Once | CUTANEOUS | Status: AC
  Filled 2024-06-20: qty 0.9

## 2024-06-20 MED ORDER — BACITRACIN ZINC 500 UNIT/GM EX OINT: 1.0000 | TOPICAL_OINTMENT | Freq: Two times a day (BID) | CUTANEOUS | 0 refills | Status: AC
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
## 2024-06-21 LAB — LEVETIRACETAM LEVEL: Levetiracetam Lvl: 22.5 ug/mL (ref 10.0–40.0)

## 2024-07-04 ENCOUNTER — Other Ambulatory Visit: Payer: Self-pay | Admitting: Neurology

## 2024-07-04 ENCOUNTER — Other Ambulatory Visit: Payer: Self-pay | Admitting: Family Medicine

## 2024-07-04 DIAGNOSIS — J4489 Other specified chronic obstructive pulmonary disease: Secondary | ICD-10-CM

## 2024-07-04 NOTE — Telephone Encounter (Signed)
 Last seen on 12/29/23 per note  Again discussed with family that plan for now is to continue with levetiracetam  1000 mg twice daily    No follow up scheduled

## 2024-07-08 ENCOUNTER — Ambulatory Visit (INDEPENDENT_AMBULATORY_CARE_PROVIDER_SITE_OTHER): Admitting: Family Medicine

## 2024-07-08 ENCOUNTER — Other Ambulatory Visit: Payer: Self-pay | Admitting: Family Medicine

## 2024-07-08 ENCOUNTER — Encounter: Payer: Self-pay | Admitting: Family Medicine

## 2024-07-08 VITALS — BP 124/79 | HR 89 | Ht 66.5 in | Wt 118.0 lb

## 2024-07-08 DIAGNOSIS — R7309 Other abnormal glucose: Secondary | ICD-10-CM | POA: Diagnosis not present

## 2024-07-08 DIAGNOSIS — I10 Essential (primary) hypertension: Secondary | ICD-10-CM

## 2024-07-08 DIAGNOSIS — I700 Atherosclerosis of aorta: Secondary | ICD-10-CM

## 2024-07-08 DIAGNOSIS — R42 Dizziness and giddiness: Secondary | ICD-10-CM

## 2024-07-08 DIAGNOSIS — R079 Chest pain, unspecified: Secondary | ICD-10-CM

## 2024-07-08 DIAGNOSIS — J4489 Other specified chronic obstructive pulmonary disease: Secondary | ICD-10-CM

## 2024-07-08 DIAGNOSIS — E7800 Pure hypercholesterolemia, unspecified: Secondary | ICD-10-CM

## 2024-07-08 DIAGNOSIS — R569 Unspecified convulsions: Secondary | ICD-10-CM

## 2024-07-08 DIAGNOSIS — K118 Other diseases of salivary glands: Secondary | ICD-10-CM | POA: Diagnosis not present

## 2024-07-08 DIAGNOSIS — D490 Neoplasm of unspecified behavior of digestive system: Secondary | ICD-10-CM

## 2024-07-08 LAB — POCT GLYCOSYLATED HEMOGLOBIN (HGB A1C): HbA1c, POC (prediabetic range): 5.7 % (ref 5.7–6.4)

## 2024-07-08 MED ORDER — AMLODIPINE BESYLATE 5 MG PO TABS: 5.0000 mg | ORAL_TABLET | Freq: Every day | ORAL | 3 refills | Status: AC

## 2024-07-08 MED ORDER — ASPIRIN EC 81 MG PO TBEC: 81.0000 mg | DELAYED_RELEASE_TABLET | Freq: Every day | ORAL | 3 refills | Status: AC

## 2024-07-08 MED ORDER — METOPROLOL TARTRATE 25 MG PO TABS: 25.0000 mg | ORAL_TABLET | Freq: Two times a day (BID) | ORAL | 0 refills | Status: AC

## 2024-07-08 MED ORDER — ALBUTEROL SULFATE HFA 108 (90 BASE) MCG/ACT IN AERS: INHALATION_SPRAY | RESPIRATORY_TRACT | 1 refills | Status: AC

## 2024-07-08 MED ORDER — ROSUVASTATIN CALCIUM 20 MG PO TABS: 20.0000 mg | ORAL_TABLET | Freq: Every day | ORAL | 0 refills | Status: AC
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
## 2024-07-08 NOTE — Assessment & Plan Note (Signed)
 BP wnl on recheck. Discussed importance of metoprolol .  - continue amlodipine  5 mg - continue metoprolol  25 mg BID

## 2024-07-08 NOTE — Assessment & Plan Note (Signed)
-   lipid panel today - will switch from atorvastatin  80mg  to rosuvastatin 20mg

## 2024-07-08 NOTE — Telephone Encounter (Signed)
 Patient needs follow-up prior to refill.
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
## 2024-07-08 NOTE — Assessment & Plan Note (Signed)
 Lungs CTAB. Reordered albuterol  prn. Will have patient follow up in 2-3 weeks to see how she is feeling

## 2024-07-12 ENCOUNTER — Other Ambulatory Visit

## 2024-07-12 DIAGNOSIS — E7800 Pure hypercholesterolemia, unspecified: Secondary | ICD-10-CM

## 2024-07-12 DIAGNOSIS — I10 Essential (primary) hypertension: Secondary | ICD-10-CM

## 2024-07-13 ENCOUNTER — Ambulatory Visit: Payer: Self-pay | Admitting: Family Medicine

## 2024-07-13 LAB — BASIC METABOLIC PANEL WITH GFR
BUN/Creatinine Ratio: 14 (ref 12–28)
BUN: 15 mg/dL (ref 8–27)
CO2: 22 mmol/L (ref 20–29)
Calcium: 9.7 mg/dL (ref 8.7–10.3)
Chloride: 101 mmol/L (ref 96–106)
Creatinine, Ser: 1.07 mg/dL — ABNORMAL HIGH (ref 0.57–1.00)
Glucose: 104 mg/dL — ABNORMAL HIGH (ref 70–99)
Potassium: 4.4 mmol/L (ref 3.5–5.2)
Sodium: 138 mmol/L (ref 134–144)
eGFR: 56 mL/min/1.73 — ABNORMAL LOW (ref >59)

## 2024-07-13 LAB — LIPID PANEL
Chol/HDL Ratio: 4.4 ratio (ref 0.0–4.4)
Cholesterol, Total: 198 mg/dL (ref 100–199)
HDL: 45 mg/dL (ref >39)
LDL Chol Calc (NIH): 131 mg/dL — ABNORMAL HIGH (ref 0–99)
Triglycerides: 125 mg/dL (ref 0–149)
VLDL Cholesterol Cal: 22 mg/dL (ref 5–40)

## 2024-07-13 NOTE — Telephone Encounter (Signed)
 Called patient to discuss lab results. Verified name and DOB. No answer. Mailbox full, no message left. Will have patient continue current dosing of rosuvastatin and plan to recheck lipids in 3 months time.

## 2024-07-16 ENCOUNTER — Other Ambulatory Visit: Payer: Self-pay | Admitting: Family Medicine

## 2024-07-16 DIAGNOSIS — I700 Atherosclerosis of aorta: Secondary | ICD-10-CM

## 2024-07-18 ENCOUNTER — Telehealth: Payer: Self-pay

## 2024-07-18 ENCOUNTER — Ambulatory Visit: Admitting: Neurology

## 2024-07-18 NOTE — Telephone Encounter (Signed)
 Received call from Dorothy at [facility] regarding PA for ECHO.   Once ECHO is approved through insurance, she will be able to get this scheduled.   Will forward to Dayshia and Harlene.   Chiquita JAYSON English, RN

## 2024-07-19 NOTE — Telephone Encounter (Signed)
 No Pa needed. Harlene Carte, CMA

## 2024-07-22 ENCOUNTER — Ambulatory Visit: Admitting: Family Medicine

## 2024-07-22 NOTE — Assessment & Plan Note (Deleted)
 Previously on amlodipine  5mg  and metoprolol  25mg  BID. She was not compliant with metoprolol . Discussed restarting at last visit.

## 2024-07-22 NOTE — Progress Notes (Deleted)
    SUBJECTIVE:   CHIEF COMPLAINT / HPI:   Here for follow up on chronic conditions.  HTN: previously on amlodipine  5mg . Was supposed to be taking metoprolol , had not been,  Seizure: previous seizure with plan to continue keppra  1000mg  BID. She sometimes only does 1000mg  daily if she is dizzy. Discussed follow up with neurology.  Asthma- COPD: patient was feeling SOB for a while, since the summer. Still smoking 3-4 cigs/day. Refilled albuterol  last visit.  Chestpain:   PERTINENT  PMH / PSH: ***  OBJECTIVE:   There were no vitals taken for this visit.  ***  ASSESSMENT/PLAN:   Assessment & Plan Essential hypertension Previously on amlodipine  5mg  and metoprolol  25mg  BID. She was not compliant with metoprolol . Discussed restarting at last visit.  Tobacco use Patient qualifies for low dose CT scan for lung cancer screening given >20 year pack history with current tobacco use. Ordered. Cessation discussed. Patient expressed understanding, would like to continue working on it herself. Will continue to discuss at future visits.     Health Maintenance: - DEXA scan ordered - referral to GI for screening colonoscopy - referral for mammogram  Gloriann Ogren, MD [facility]

## 2024-08-18 ENCOUNTER — Ambulatory Visit (HOSPITAL_COMMUNITY): Admission: RE | Admit: 2024-08-18 | Discharge: 2024-08-18 | Attending: *Deleted | Admitting: *Deleted

## 2024-08-18 DIAGNOSIS — R079 Chest pain, unspecified: Secondary | ICD-10-CM

## 2024-08-18 DIAGNOSIS — I358 Other nonrheumatic aortic valve disorders: Secondary | ICD-10-CM | POA: Diagnosis not present

## 2024-08-18 DIAGNOSIS — I4891 Unspecified atrial fibrillation: Secondary | ICD-10-CM | POA: Diagnosis not present

## 2024-08-18 LAB — ECHOCARDIOGRAM COMPLETE
Area-P 1/2: 2.85 cm2
Calc EF: 62.8 %
MV VTI: 2.54 cm2
S' Lateral: 2.1 cm
Single Plane A2C EF: 65.4 %
Single Plane A4C EF: 58.4 %

## 2024-08-18 NOTE — Progress Notes (Signed)
°  Echocardiogram 2D Echocardiogram has been performed.  [name] 08/18/2024, 2:05 PM

## 2024-08-22 ENCOUNTER — Telehealth: Payer: Self-pay | Admitting: Family Medicine

## 2024-08-22 NOTE — Telephone Encounter (Signed)
 Called patient to discuss results of Echo. Phone went to VM and mailbox was full. If patient returns call, she should schedule a visit with her cardiologist based on her echo results.

## 2024-09-19 ENCOUNTER — Telehealth: Payer: Self-pay

## 2024-09-19 NOTE — Telephone Encounter (Signed)
 Patient calls nurse line requesting nausea medication.   She reports she has had intermittent nausea and episodes of vomiting for ~ 1 week.   She denies any fevers, abdominal pain, detention or blood. No diarrhea or constipation.   Patient advised she would need to be evaluated in clinic for anti nausea medication. She reports she does not have transportation at the moment. She reports she will  ask around and see if she can find a ride for tomorrow.   Advised ED visit if she is unable to keep fluids down.   Precautions discussed.

## 2024-09-22 ENCOUNTER — Ambulatory Visit

## 2024-09-22 VITALS — Ht 66.5 in | Wt 125.0 lb

## 2024-09-22 DIAGNOSIS — Z0000 Encounter for general adult medical examination without abnormal findings: Secondary | ICD-10-CM | POA: Diagnosis not present

## 2024-09-22 NOTE — Progress Notes (Addendum)
 "  Chief Complaint  Patient presents with   Medicare Wellness    SUBSEQUENT     Subjective:   [name] is a 74 y.o. female who presents for a Medicare Annual Wellness Visit.  Visit info / Clinical Intake: Medicare Wellness Visit Type:: Subsequent Annual Wellness Visit Persons participating in visit and providing information:: patient Medicare Wellness Visit Mode:: Telephone If telephone:: video declined Since this visit was completed virtually, some vitals may be partially provided or unavailable. Missing vitals are due to the limitations of the virtual format.: Documented vitals are patient reported If Telephone or Video please confirm:: I connected with patient using audio/video enable telemedicine. I verified patient identity with two identifiers, discussed telehealth limitations, and patient agreed to proceed. Patient Location:: HOME Provider Location:: OFFICE Interpreter Needed?: No Pre-visit prep was completed: yes AWV questionnaire completed by patient prior to visit?: no Living arrangements:: (!) lives alone Patient's Overall Health Status Rating: (!) fair Typical amount of pain: (!) a lot Does pain affect daily life?: (!) yes Are you currently prescribed opioids?: no  Dietary Habits and Nutritional Risks How many meals a day?: (!) 1 (DUE TO NAUSEA/VOMITING/DIARRHEA) Eats fruit and vegetables daily?: (!) no Most meals are obtained by: preparing own meals In the last 2 weeks, have you had any of the following?: (!) nausea, vomiting, diarrhea Diabetic:: no  Functional Status Activities of Daily Living (to include ambulation/medication): Independent Ambulation: Independent Medication Administration: Independent Home Management (perform basic housework or laundry): Independent Manage your own finances?: yes Primary transportation is: family / friends (DUE TO SEIZURES) Concerns about vision?: no *vision screening is required for WTM* Concerns about hearing?:  no  Fall Screening Falls in the past year?: 1 Number of falls in past year: 1 Was there an injury with Fall?: 1 Fall Risk Category Calculator: 3 Patient Fall Risk Level: High Fall Risk  Fall Risk Patient at Risk for Falls Due to: History of fall(s) Fall risk Follow up: Falls evaluation completed; Education provided  Home and Transportation Safety: All rugs have non-skid backing?: N/A, no rugs All stairs or steps have railings?: N/A, no stairs (15 STEPS TO GET INTO HOME) Grab bars in the bathtub or shower?: yes Have non-skid surface in bathtub or shower?: yes Good home lighting?: yes Regular seat belt use?: yes Hospital stays in the last year:: no  Cognitive Assessment Difficulty concentrating, remembering, or making decisions? : yes Will 6CIT or Mini Cog be Completed: no 6CIT or Mini Cog Declined: patient alert, oriented, able to answer questions appropriately and recall recent events  Advance Directives (For Healthcare) Does Patient Have a Medical Advance Directive?: No Does patient want to make changes to medical advance directive?: No - Patient declined Type of Advance Directive: Healthcare Power of Quail; Living will Copy of Healthcare Power of Attorney in Chart?: No - copy requested Copy of Living Will in Chart?: No - copy requested Would patient like information on creating a medical advance directive?: No - Patient declined  Reviewed/Updated  Reviewed/Updated: Reviewed All (Medical, Surgical, Family, Medications, Allergies, Care Teams, Patient Goals)    Allergies (verified) Patient has no known allergies.   Current Medications (verified) Outpatient Encounter Medications as of 09/22/2024  Medication Sig   acetaminophen  (TYLENOL ) 325 MG tablet Take 2 tablets (650 mg total) by mouth every 6 (six) hours as needed.   albuterol  (VENTOLIN  HFA) 108 (90 Base) MCG/ACT inhaler INHALE 2 PUFFS INTO THE LUNGS EVERY 4 HOURS AS NEEDED FOR WHEEZING OR SHORTNESS OF BREATH  amLODipine  (NORVASC ) 5 MG tablet Take 1 tablet (5 mg total) by mouth daily.   aspirin  EC 81 MG tablet Take 1 tablet (81 mg total) by mouth daily.   bacitracin  ointment Apply 1 Application topically 2 (two) times daily.   levETIRAcetam  (KEPPRA ) 1000 MG tablet Take 1 tablet (1,000 mg total) by mouth 2 (two) times daily. (Patient taking differently: Take 1,000 mg by mouth 2 (two) times daily. Sometimes takes once a day if dizzy)   metoprolol  tartrate (LOPRESSOR ) 25 MG tablet Take 1 tablet (25 mg total) by mouth 2 (two) times daily. (Patient not taking: Reported on 09/22/2024)   nystatin  (MYCOSTATIN /NYSTOP ) powder Apply 1 application topically 3 (three) times daily.   rosuvastatin  (CRESTOR ) 20 MG tablet Take 1 tablet (20 mg total) by mouth daily. (Patient not taking: Reported on 09/22/2024)   TRELEGY ELLIPTA  100-62.5-25 MCG/ACT AEPB INHALE 1 PUFF INTO THE LUNGS DAILY   No facility-administered encounter medications on file as of 09/22/2024.    History: Past Medical History:  Diagnosis Date   Abdominal pain, epigastric 02/23/2013   Abdominal pain, unspecified site 08/15/2013   Abnormal neurological exam 04/11/2016   Acute CVA (cerebrovascular accident) (HCC) 11/03/2019   Acute non-recurrent frontal sinusitis 05/21/2018   Asthma    Cataracts, bilateral 05/03/2014   Cephalalgia    Chronic bronchitis (HCC)    Chronic lower back pain    COPD (chronic obstructive pulmonary disease) (HCC)    CVA (cerebral vascular accident) (HCC) 11/04/2019   Daily headache    De Quervain's tenosynovitis, left 11/09/2014   Essential hypertension 10/08/2021   GERD (gastroesophageal reflux disease)    Hx of colonic polyps    Maxillary sinusitis    Nausea & vomiting    Orthopnea 09/28/2015   Reports of 5 pillow orthopnea (from 2-3) which began with cold symptoms. Could be due to cold but would like to rule out cardiac etiology. Also noted of 1 year history of dyspnea on exertion which only worsens when she gets a cold; this  could be due to COPD however cardiac etiology should be considered. No JVD or LE edema on exam.  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   Hx of Rt vertebral artery occlusion   Past Surgical History:  Procedure Laterality Date   ABDOMINAL AORTOGRAM W/LOWER EXTREMITY N/A 10/06/2016   Procedure: Abdominal Aortogram w/Lower Extremity;  Surgeon: Penne Lonni [place], MD;  Location: [facility] INVASIVE CV LAB;  Service: Cardiovascular;  Laterality: N/A;   ABDOMINAL AORTOGRAM W/LOWER EXTREMITY N/A 02/10/2018   Procedure: ABDOMINAL AORTOGRAM W/LOWER EXTREMITY;  Surgeon: Colorado Penne Lonni, MD;  Location: [facility] INVASIVE CV LAB;  Service: Cardiovascular;  Laterality: N/A;   ABDOMINAL HERNIA REPAIR     APPENDECTOMY     COLONOSCOPY W/ POLYPECTOMY  02/26/2005   FEMORAL-POPLITEAL BYPASS GRAFT Left 04/20/2018   Procedure: Left FEMORAL-POPLITEAL ARTERY Bypass Graft using Gore Propaten Vascular Graft;  Surgeon: Colorado Penne Lonni, MD;  Location: [facility] OR;  Service: Vascular;  Laterality: Left;   HERNIA REPAIR     LOWER EXTREMITY ANGIOGRAPHY Bilateral 04/19/2018   Procedure: LOWER EXTREMITY ANGIOGRAPHY;  Surgeon: Colorado Penne Lonni, MD;  Location: [facility] INVASIVE CV LAB;  Service: Cardiovascular;  Laterality: Bilateral;   PERIPHERAL VASCULAR ATHERECTOMY Right 10/06/2016  Procedure: Peripheral Vascular Atherectomy;  Surgeon: Penne Lonni Colorado, MD;  Location: [facility] INVASIVE CV LAB;  Service: Cardiovascular;  Laterality: Right;   PERIPHERAL VASCULAR INTERVENTION Right 10/06/2016   Procedure: Peripheral Vascular Intervention;  Surgeon: Penne Lonni [place], MD;  Location: [facility] INVASIVE CV LAB;  Service: Cardiovascular;  Laterality: Right;  SFA   PERIPHERAL VASCULAR INTERVENTION Left 02/10/2018    Procedure: PERIPHERAL VASCULAR INTERVENTION;  Surgeon: [place] Penne Lonni, MD;  Location: [facility] INVASIVE CV LAB;  Service: Cardiovascular;  Laterality: Left;   TONSILLECTOMY  1960s   UPPER GASTROINTESTINAL ENDOSCOPY  03/22/2010, 02/13/2005   VAGINAL HYSTERECTOMY     partial   Family History  Problem Relation Age of Onset   Cancer Other    Coronary artery disease Other    Cancer Mother    Hypertension Mother    Heart disease Mother    Cancer Father    Hypertension Father    Cancer Sister    Hypertension Sister    Social History   Occupational History   Not on file  Tobacco Use   Smoking status: Every Day    Current packs/day: 0.50    Average packs/day: 0.5 packs/day for 47.0 years (23.5 ttl pk-yrs)    Types: Cigarettes   Smokeless tobacco: Never   Tobacco comments:    3-4 cigarettes per day  Vaping Use   Vaping status: Never Used  Substance and Sexual Activity   Alcohol use: Not Currently    Alcohol/week: 0.0 standard drinks of alcohol    Comment: Quit alcohol in 1992; never really drank much alcohol   Drug use: Yes    Types: Marijuana    Comment: 02/10/2018 ~ twice//wk   Sexual activity: Not Currently   Tobacco Counseling Ready to quit: Not Answered Counseling given: Not Answered Tobacco comments: 3-4 cigarettes per day  SDOH Screenings   Food Insecurity: No Food Insecurity (09/22/2024)  Housing: Low Risk (09/22/2024)  Transportation Needs: No Transportation Needs (09/22/2024)  Utilities: Not At Risk (09/22/2024)  Alcohol Screen: Low Risk (09/22/2024)  Depression (PHQ2-9): High Risk (09/22/2024)  Financial Resource Strain: Low Risk (09/22/2024)  Physical Activity: Insufficiently Active (09/22/2024)  Social Connections: Moderately Integrated (09/22/2024)  Stress: No Stress Concern Present (09/22/2024)  Tobacco Use: High Risk (09/22/2024)  Health Literacy: Adequate Health Literacy (09/22/2024)   See flowsheets for full screening details  Depression Screen PHQ  2 & 9 Depression Scale- Over the past 2 weeks, how often have you been bothered by any of the following problems? Little interest or pleasure in doing things: 3 Feeling down, depressed, or hopeless (PHQ Adolescent also includes...irritable): 0 PHQ-2 Total Score: 3 Trouble falling or staying asleep, or sleeping too much: 3 Feeling tired or having little energy: 3 Poor appetite or overeating (PHQ Adolescent also includes...weight loss): 3 Feeling bad about yourself - or that you are a failure or have let yourself or your family down: 0 Trouble concentrating on things, such as reading the newspaper or watching television (PHQ Adolescent also includes...like school work): 1 Moving or speaking so slowly that other people could have noticed. Or the opposite - being so fidgety or restless that you have been moving around a lot more than usual: 0 Thoughts that you would be better off dead, or of hurting yourself in some way: 0 PHQ-9 Total Score: 13  Depression Treatment Depression Interventions/Treatment : PHQ2-9 Score <4 Follow-up Not Indicated     Goals Addressed             This Visit's  Progress    Quit Smoking       Quit Smoking Goals: Manage cravings and triggers by practicing mindfulness and relaxation techniques. Create a personalized quit plan. Enlist support from friends and family. Reward yourself for accomplishments. Find alternative activities. Make healthier choices. Prepare for difficult situations. Identify reasons to quit.             Objective:    Today's Vitals   09/22/24 1333  Weight: 125 lb (56.7 kg)  Height: 5' 6.5 (1.689 m)  PainSc: 10-Worst pain ever  PainLoc: Abdomen   Body mass index is 19.87 kg/m.  Hearing/Vision screen No results found. Immunizations and Health Maintenance Health Maintenance  Topic Date Due   Colonoscopy  Never done   Mammogram  05/11/2015   Bone Density Scan  Never done   Lung Cancer Screening  10/08/2022    Pneumococcal Vaccine: 50+ Years (2 of 2 - PCV) 10/09/2022   Influenza Vaccine  04/01/2024   COVID-19 Vaccine (1 - 2025-26 season) Never done   Medicare Annual Wellness (AWV)  09/22/2025   DTaP/Tdap/Td (2 - Td or Tdap) 06/19/2034   Hepatitis C Screening  Completed   Zoster Vaccines- Shingrix  Completed   Meningococcal B Vaccine  Aged Out        Assessment/Plan:  This is a routine wellness examination for [name].  Patient Care Team: Lonnie Earnest, MD as PCP - General (Family Medicine) Claudene Victory ORN, MD (Inactive) as Consulting Physician (Cardiology) Gregg Lek, MD as Consulting Physician (Neurology) [name] Rush, DPM as Consulting Physician (Podiatry)  I have personally reviewed and noted the following in the patients chart:   Medical and social history Use of alcohol, tobacco or illicit drugs  Current medications and supplements including opioid prescriptions. Functional ability and status Nutritional status Physical activity Advanced directives List of other physicians Hospitalizations, surgeries, and ER visits in previous 12 months Vitals Screenings to include cognitive, depression, and falls Referrals and appointments  No orders of the defined types were placed in this encounter.  In addition, I have reviewed and discussed with patient certain preventive protocols, quality metrics, and best practice recommendations. A written personalized care plan for preventive services as well as general preventive health recommendations were provided to patient.   Roz LOISE Fuller, LPN   [id number]   Return in about 1 year (around 09/22/2025) for Medicare wellness.  After Visit Summary: (Declined) Due to this being a telephonic visit, with patients personalized plan was offered to patient but patient Declined AVS at this time   Nurse Notes:  Separate telephone note sent for (patient having nausea, vomiting and abdominal pain due to taking Metoprolol  and Rouvastatin.) HM  Addressed: Vaccines Due: Pneumococcal and Flu. Patient is due for the following care gaps: Mammogram, Lung Cancer Screening, Colonoscopy and Bone Density Scan.   "

## 2024-09-22 NOTE — Patient Instructions (Signed)
 Ms. [name],  Thank you for taking the time for your Medicare Wellness Visit. I appreciate your continued commitment to your health goals. Please review the care plan we discussed, and feel free to reach out if I can assist you further.  Please note that Annual Wellness Visits do not include a physical exam. Some assessments may be limited, especially if the visit was conducted virtually. If needed, we may recommend an in-person follow-up with your provider.  Ongoing Care Seeing your primary care provider every 3 to 6 months helps us  monitor your health and provide consistent, personalized care.   Referrals If a referral was made during today's visit and you haven't received any updates within two weeks, please contact the referred provider directly to check on the status.  Recommended Screenings:  Health Maintenance  Topic Date Due   Colon Cancer Screening  Never done   Breast Cancer Screening  05/11/2015   Osteoporosis screening with Bone Density Scan  Never done   Medicare Annual Wellness Visit  08/16/2021   Screening for Lung Cancer  10/08/2022   Pneumococcal Vaccine for age over 36 (2 of 2 - PCV) 10/09/2022   Flu Shot  04/01/2024   COVID-19 Vaccine (1 - 2025-26 season) Never done   DTaP/Tdap/Td vaccine (2 - Td or Tdap) 06/19/2034   Hepatitis C Screening  Completed   Zoster (Shingles) Vaccine  Completed   Meningitis B Vaccine  Aged Out       09/22/2024    1:39 PM  Advanced Directives  Does Patient Have a Medical Advance Directive? No  Would patient like information on creating a medical advance directive? No - Patient declined    Vision: Annual vision screenings are recommended for early detection of glaucoma, cataracts, and diabetic retinopathy. These exams can also reveal signs of chronic conditions such as diabetes and high blood pressure.  Dental: Annual dental screenings help detect early signs of oral cancer, gum disease, and other conditions linked to overall  health, including heart disease and diabetes.  Please see the attached documents for additional preventive care recommendations.

## 2024-09-27 ENCOUNTER — Ambulatory Visit: Admitting: Podiatry

## 2024-09-28 ENCOUNTER — Emergency Department (HOSPITAL_COMMUNITY)

## 2024-09-28 ENCOUNTER — Emergency Department (HOSPITAL_COMMUNITY)
Admission: EM | Admit: 2024-09-28 | Discharge: 2024-09-28 | Disposition: A | Discharge status: Home / Self Care | Attending: Emergency Medicine | Admitting: Emergency Medicine

## 2024-09-28 ENCOUNTER — Other Ambulatory Visit: Payer: Self-pay

## 2024-09-28 DIAGNOSIS — R569 Unspecified convulsions: Secondary | ICD-10-CM | POA: Insufficient documentation

## 2024-09-28 DIAGNOSIS — Z7982 Long term (current) use of aspirin: Secondary | ICD-10-CM | POA: Insufficient documentation

## 2024-09-28 DIAGNOSIS — J4489 Other specified chronic obstructive pulmonary disease: Secondary | ICD-10-CM | POA: Diagnosis not present

## 2024-09-28 DIAGNOSIS — I10 Essential (primary) hypertension: Secondary | ICD-10-CM | POA: Insufficient documentation

## 2024-09-28 LAB — URINALYSIS, W/ REFLEX TO CULTURE (INFECTION SUSPECTED)
Bilirubin Urine: NEGATIVE
Glucose, UA: NEGATIVE mg/dL
Hgb urine dipstick: NEGATIVE
Ketones, ur: NEGATIVE mg/dL
Leukocytes,Ua: NEGATIVE
Nitrite: NEGATIVE
Protein, ur: NEGATIVE mg/dL
Specific Gravity, Urine: 1.011 (ref 1.005–1.030)
pH: 7 (ref 5.0–8.0)

## 2024-09-28 LAB — COMPREHENSIVE METABOLIC PANEL WITH GFR
ALT: 5 U/L (ref 0–44)
AST: 18 U/L (ref 15–41)
Albumin: 3.8 g/dL (ref 3.5–5.0)
Alkaline Phosphatase: 103 U/L (ref 38–126)
Anion gap: 10 (ref 5–15)
BUN: 13 mg/dL (ref 8–23)
CO2: 22 mmol/L (ref 22–32)
Calcium: 8.6 mg/dL — ABNORMAL LOW (ref 8.9–10.3)
Chloride: 107 mmol/L (ref 98–111)
Creatinine, Ser: 0.93 mg/dL (ref 0.44–1.00)
GFR, Estimated: >60 mL/min
Glucose, Bld: 95 mg/dL (ref 70–99)
Potassium: 4.7 mmol/L (ref 3.5–5.1)
Sodium: 139 mmol/L (ref 135–145)
Total Bilirubin: <0.2 mg/dL (ref 0.0–1.2)
Total Protein: 6.4 g/dL — ABNORMAL LOW (ref 6.5–8.1)

## 2024-09-28 LAB — CBC WITH DIFFERENTIAL/PLATELET
Abs Immature Granulocytes: 0.03 10*3/uL (ref 0.00–0.07)
Basophils Absolute: 0 10*3/uL (ref 0.0–0.1)
Basophils Relative: 0 %
Eosinophils Absolute: 0 10*3/uL (ref 0.0–0.5)
Eosinophils Relative: 0 %
HCT: 35.5 % — ABNORMAL LOW (ref 36.0–46.0)
Hemoglobin: 11.5 g/dL — ABNORMAL LOW (ref 12.0–15.0)
Immature Granulocytes: 0 %
Lymphocytes Relative: 15 %
Lymphs Abs: 1.3 10*3/uL (ref 0.7–4.0)
MCH: 31.4 pg (ref 26.0–34.0)
MCHC: 32.4 g/dL (ref 30.0–36.0)
MCV: 97 fL (ref 80.0–100.0)
Monocytes Absolute: 0.3 10*3/uL (ref 0.1–1.0)
Monocytes Relative: 4 %
Neutro Abs: 7.1 10*3/uL (ref 1.7–7.7)
Neutrophils Relative %: 81 %
Platelets: 202 10*3/uL (ref 150–400)
RBC: 3.66 MIL/uL — ABNORMAL LOW (ref 3.87–5.11)
RDW: 14.4 % (ref 11.5–15.5)
WBC: 8.8 10*3/uL (ref 4.0–10.5)
nRBC: 0 % (ref 0.0–0.2)

## 2024-09-28 LAB — I-STAT CHEM 8, ED
BUN: 19 mg/dL (ref 8–23)
Calcium, Ion: 1.04 mmol/L — ABNORMAL LOW (ref 1.15–1.40)
Chloride: 109 mmol/L (ref 98–111)
Creatinine, Ser: 1 mg/dL (ref 0.44–1.00)
Glucose, Bld: 93 mg/dL (ref 70–99)
HCT: 36 % (ref 36.0–46.0)
Hemoglobin: 12.2 g/dL (ref 12.0–15.0)
Potassium: 7.3 mmol/L (ref 3.5–5.1)
Sodium: 138 mmol/L (ref 135–145)
TCO2: 23 mmol/L (ref 22–32)

## 2024-09-28 LAB — URINE DRUG SCREEN
Amphetamines: NEGATIVE
Barbiturates: NEGATIVE
Benzodiazepines: NEGATIVE
Cocaine: NEGATIVE
Fentanyl: NEGATIVE
Methadone Scn, Ur: NEGATIVE
Opiates: NEGATIVE
Tetrahydrocannabinol: POSITIVE — AB

## 2024-09-28 LAB — CBG MONITORING, ED: Glucose-Capillary: 102 mg/dL — ABNORMAL HIGH (ref 70–99)

## 2024-09-28 LAB — TSH: TSH: 1.43 u[IU]/mL (ref 0.350–4.500)

## 2024-09-28 LAB — ETHANOL: Alcohol, Ethyl (B): <15 mg/dL

## 2024-09-28 LAB — AMMONIA: Ammonia: 18 umol/L (ref 9–35)

## 2024-09-28 MED ORDER — ONDANSETRON HCL 4 MG/2ML IJ SOLN
4.0000 mg | Freq: Once | INTRAMUSCULAR | Status: AC
  Administered 2024-09-28: 4 mg via INTRAVENOUS
  Filled 2024-09-28: qty 2

## 2024-09-28 MED ORDER — LEVETIRACETAM (KEPPRA) 500 MG/5 ML ADULT IV PUSH
1000.0000 mg | Freq: Once | INTRAVENOUS | Status: AC
  Administered 2024-09-28: 1000 mg via INTRAVENOUS
  Filled 2024-09-28: qty 10

## 2024-09-28 MED ORDER — SODIUM CHLORIDE 0.9 % IV BOLUS
1000.0000 mL | Freq: Once | INTRAVENOUS | Status: AC
  Administered 2024-09-28: 1000 mL via INTRAVENOUS

## 2024-09-28 NOTE — Discharge Instructions (Signed)
 Your history, exam, workup today seem consistent with a breakthrough seizure like related to dehydration as we discussed.  Your CT imaging appeared similar to prior as did most of your labs.  We feel you are safe for discharge home given your well appearance and stability for over 7 hours.  Please rest and stay hydrated and continue your seizure medicine.  We gave you IV Keppra  today.  Please follow-up with your neurology team and your primary doctor.  If any symptoms change or worsen acutely, please return to the nearest emergency department.

## 2024-09-28 NOTE — ED Provider Notes (Signed)
 " [place] EMERGENCY DEPARTMENT AT [facility] Provider Note   CSN: [id number] Arrival date & time: 09/28/24  1523     Patient presents with: Seizures   [name] is a 74 y.o. female.   The history is provided by the patient, medical records and the EMS personnel. No language interpreter was used.  Seizures Seizure activity on arrival: no   Seizure type:  Unable to specify Initial focality:  Unable to specify Episode characteristics: confusion   Episode characteristics: no combativeness   Postictal symptoms: confusion   Severity:  Unable to specify Timing:  Unable to specify Progression:  Resolved Recent head injury:  No recent head injuries PTA treatment:  None History of seizures: yes        Prior to Admission medications  Medication Sig Start Date End Date Taking? Authorizing Provider  acetaminophen  (TYLENOL ) 325 MG tablet Take 2 tablets (650 mg total) by mouth every 6 (six) hours as needed. 06/20/24   Jarold Olam HERO, PA-C  albuterol  (VENTOLIN  HFA) 108 (90 Base) MCG/ACT inhaler INHALE 2 PUFFS INTO THE LUNGS EVERY 4 HOURS AS NEEDED FOR WHEEZING OR SHORTNESS OF BREATH 07/08/24   Baloch, Mahnoor, MD  amLODipine  (NORVASC ) 5 MG tablet Take 1 tablet (5 mg total) by mouth daily. 07/08/24   Baloch, Mahnoor, MD  aspirin  EC 81 MG tablet Take 1 tablet (81 mg total) by mouth daily. 07/08/24   Baloch, Mahnoor, MD  bacitracin  ointment Apply 1 Application topically 2 (two) times daily. 06/20/24   Jarold Olam HERO, PA-C  levETIRAcetam  (KEPPRA ) 1000 MG tablet Take 1 tablet (1,000 mg total) by mouth 2 (two) times daily. Patient taking differently: Take 1,000 mg by mouth 2 (two) times daily. Sometimes takes once a day if dizzy 12/29/23 12/23/24  Camara, Amadou, MD  metoprolol  tartrate (LOPRESSOR ) 25 MG tablet Take 1 tablet (25 mg total) by mouth 2 (two) times daily. Patient not taking: Reported on 09/22/2024 07/08/24   Lonnie Earnest, MD  nystatin  (MYCOSTATIN /NYSTOP ) powder Apply 1  application topically 3 (three) times daily. 04/19/21   Macario Dorothyann HERO, MD  rosuvastatin  (CRESTOR ) 20 MG tablet Take 1 tablet (20 mg total) by mouth daily. Patient not taking: Reported on 09/22/2024 07/08/24   Lonnie Earnest, MD  TRELEGY ELLIPTA  100-62.5-25 MCG/ACT AEPB INHALE 1 PUFF INTO THE LUNGS DAILY 07/04/24   Lonnie Mahnoor, MD    Allergies: Patient has no known allergies.    Review of Systems  Constitutional:  Positive for fatigue. Negative for chills and fever.  HENT:  Negative for congestion.   Eyes:  Negative for visual disturbance.  Respiratory:  Negative for cough, chest tightness, shortness of breath and wheezing.   Cardiovascular:  Negative for chest pain and palpitations.  Gastrointestinal:  Negative for abdominal pain, constipation, diarrhea, nausea and vomiting.  Genitourinary:  Negative for dysuria.  Musculoskeletal:  Negative for back pain, neck pain and neck stiffness.  Skin:  Negative for rash and wound.  Neurological:  Positive for seizures and light-headedness. Negative for dizziness, tremors, syncope, speech difficulty, weakness, numbness and headaches.  Psychiatric/Behavioral:  Positive for confusion. Negative for agitation.   All other systems reviewed and are negative.   Updated Vital Signs BP (!) 160/80   Pulse 83   Temp 98.9 F (37.2 C) (Oral)   Resp 17   SpO2 100%   Physical Exam Vitals and nursing note reviewed.  Constitutional:      General: She is not in acute distress.    Appearance: She is  well-developed. She is not ill-appearing, toxic-appearing or diaphoretic.  HENT:     Head: Normocephalic and atraumatic.     Nose: No congestion or rhinorrhea.     Mouth/Throat:     Mouth: Mucous membranes are dry.     Pharynx: No oropharyngeal exudate or posterior oropharyngeal erythema.  Eyes:     Extraocular Movements: Extraocular movements intact.     Conjunctiva/sclera: Conjunctivae normal.     Pupils: Pupils are equal, round, and reactive to  light.  Cardiovascular:     Rate and Rhythm: Normal rate and regular rhythm.     Pulses: Normal pulses.     Heart sounds: No murmur heard. Pulmonary:     Effort: Pulmonary effort is normal. No respiratory distress.     Breath sounds: Normal breath sounds. No wheezing, rhonchi or rales.  Chest:     Chest wall: No tenderness.  Abdominal:     General: Abdomen is flat.     Palpations: Abdomen is soft.     Tenderness: There is no abdominal tenderness. There is no guarding or rebound.  Musculoskeletal:        General: No swelling or tenderness.     Cervical back: Neck supple. No tenderness.     Right lower leg: No edema.     Left lower leg: No edema.  Skin:    General: Skin is warm and dry.     Capillary Refill: Capillary refill takes less than 2 seconds.     Findings: No erythema or rash.  Neurological:     General: No focal deficit present.     Mental Status: She is alert.     Sensory: No sensory deficit.     Motor: No weakness.  Psychiatric:        Mood and Affect: Mood normal.     (all labs ordered are listed, but only abnormal results are displayed) Labs Reviewed  CBC WITH DIFFERENTIAL/PLATELET - Abnormal; Notable for the following components:      Result Value   RBC 3.66 (*)    Hemoglobin 11.5 (*)    HCT 35.5 (*)    All other components within normal limits  URINALYSIS, W/ REFLEX TO CULTURE (INFECTION SUSPECTED) - Abnormal; Notable for the following components:   Color, Urine STRAW (*)    Bacteria, UA RARE (*)    All other components within normal limits  URINE DRUG SCREEN - Abnormal; Notable for the following components:   Tetrahydrocannabinol POSITIVE (*)    All other components within normal limits  COMPREHENSIVE METABOLIC PANEL WITH GFR - Abnormal; Notable for the following components:   Calcium  8.6 (*)    Total Protein 6.4 (*)    All other components within normal limits  I-STAT CHEM 8, ED - Abnormal; Notable for the following components:   Potassium 7.3 (*)     Calcium , Ion 1.04 (*)    All other components within normal limits  CBG MONITORING, ED - Abnormal; Notable for the following components:   Glucose-Capillary 102 (*)    All other components within normal limits  ETHANOL  TSH  AMMONIA    EKG: EKG Interpretation Date/Time:  Wednesday September 28 2024 15:44:49 EST Ventricular Rate:  87 PR Interval:  144 QRS Duration:  169 QT Interval:  371 QTC Calculation: 447 R Axis:   70  Text Interpretation: Sinus rhythm Right bundle branch block when compared to prior, similar appearance No STEMI Confirmed by Ginger Barefoot ([id number]) on 09/28/2024 4:05:11 PM  Radiology: [facility] Chest  2 View Result Date: 09/28/2024 EXAM: 2 VIEW(S) XRAY OF THE CHEST 09/28/2024 08:03:00 PM COMPARISON: 09/28/2024 CLINICAL HISTORY: Opacity versus shadow. FINDINGS: LUNGS AND PLEURA: Rounded density at medial right lung base on previous exam likely due to overlying structures including right hilar vessels and patient's chin. No focal pulmonary nodule or infiltrate identified. No pleural effusion. No pneumothorax. HEART AND MEDIASTINUM: Aortic atherosclerosis. No acute abnormality of the cardiac and mediastinal silhouettes. 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 IMPRESSION: 1. No focal pulmonary nodule or infiltrate is identified, and the previously questioned rounded density at the medial right lung base likely represented overlapping structures. 2. Aortic atherosclerosis. 3. 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 Electronically signed by: Dorethia Molt MD 09/28/2024 09:00 PM EST RP Workstation: [id number]   DG Chest Portable 1 View Result Date: 09/28/2024 EXAM: 1 VIEW(S) XRAY OF THE CHEST 09/28/2024 06:25:00 PM COMPARISON: CXR 08/25/2022, CXR 04/09/2021. CLINICAL HISTORY: Feeling drained, fatigued, possibly dehydrated, breakthrough seizure today. Rule out occult infection. FINDINGS: LUNGS AND PLEURA: 2 cm  rounded density in medial right lung base. No pleural effusion. No pneumothorax. HEART AND MEDIASTINUM: No acute abnormality of the cardiac and mediastinal silhouettes. BONES AND SOFT TISSUES: Multilevel thoracic osteophytosis. 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 Patient is rotated. IMPRESSION: 1. A 2 cm rounded density in the medial right lung base. May be artifactual due to patient rotation. Recommend repeat PA and lateral view of the chest for further evaluation. Electronically signed by: Morgane Naveau MD 09/28/2024 06:37 PM EST RP Workstation: [id number]   CT Head Wo Contrast Result Date: 09/28/2024 EXAM: CT HEAD WITHOUT CONTRAST 09/28/2024 06:05:20 PM TECHNIQUE: CT of the head was performed without the administration of intravenous contrast. Automated exposure control, iterative reconstruction, and/or weight based adjustment of the mA/kV was utilized to reduce the radiation dose to as low as reasonably achievable. COMPARISON: None available. CLINICAL HISTORY: Seizure. FINDINGS: BRAIN AND VENTRICLES: No acute hemorrhage. No evidence of acute infarct. No hydrocephalus. No extra-axial collection. No mass effect or midline shift. ORBITS: No acute abnormality. SINUSES: No acute abnormality. SOFT TISSUES AND SKULL: No acute soft tissue abnormality. No skull fracture. IMPRESSION: 1. No acute intracranial abnormality. Electronically signed by: Oneil Devonshire MD 09/28/2024 06:13 PM EST RP Workstation: [id number]     Procedures   Medications Ordered in the ED  sodium chloride  0.9 % bolus 1,000 mL (0 mLs Intravenous Stopped 09/28/24 1711)  levETIRAcetam  (KEPPRA ) undiluted injection 1,000 mg (1,000 mg Intravenous Given 09/28/24 1606)  ondansetron  (ZOFRAN ) injection 4 mg (4 mg Intravenous Given 09/28/24 2142)                                    Medical Decision Making Amount and/or Complexity of Data Reviewed Labs: ordered. Radiology: ordered.  Risk Prescription drug  management.    Danyiel Laforte is a 74 y.o. female with a past medical history significant for GERD, hypertension, peripheral vascular disease, previous stroke, COPD, asthma, and seizures on Keppra  who presents with seizure and lightheadedness.  According to patient, she thinks may have dehydrated and had a seizure today.  She does not member what happened.  Per EMS, she was found laying in her bed having a seizure and it is unknown when this started.  With EMS she was slightly postictal and this had improved mental status since coming here.  Glucose was reportedly 143 and not hypoglycemic.  Her vital signs are reassuring during transfer without tachycardia or hypoxia.  Blood pressures in the 140s during transport.  There is no reported trauma and she was in her bed.  Patient says that she takes her medications.  Otherwise she denies any headache, neck pain, neck stiffness, fevers, chills, congestion, cough, nausea, vomiting, constipation, diarrhea, or urinary changes.  She reports feeling well right now just feels somewhat tired.  She says she feels dizzy but then when clarifies she says she does not feel any spinning or unsteadiness but more just feels slightly lightheaded.  On exam, lungs clear.  Chest nontender.  Abdomen nontender.  Neck nontender.  For me she has intact sensation strength and pulses in extremities.  Symmetric smile.  Clear speech.  Pupils are symmetric and reactive with normal extraocular movements.  Patient resting comfortably without distress with reassuring vital signs initially.  We will get screening labs and we we will get a CT of the head as is unclear if she hit her head or not.  Will give her some fluids as she think she could be dehydrated and she does have dry mucous membranes on exam.  Will also give her IV Keppra .  It appears she has had a history of breakthrough seizures in the past.  If workup is reassuring and she continues to feel at her baseline, anticipate  likely discharge back home with outpatient neurology follow-up recommendations if she is back to baseline and workup is reassuring.  5:58 PM Initial labs began to return and her i-STAT chemistry shows a potassium of 7.3.  I reviewed her EKG and did not show peaked T waves and I spoke to nursing who said that there was some hemolysis on the first draw labs.  The metabolic panel is being rechecked to see if she truly has hyperkalemia to this extent or not.  Family is unsure if she is ever had potassium troubles like this.  She is going to CT scanner now but otherwise tells me she is feeling well and resting.  Will wait for her labs and workup to continue but anticipate discharge home if workup reassuring.   10:24 PM Workup has returned overall reassuring.  The hyperkalemia appears to have been due to, also says the last 1 was normal.  We went through all the findings with her family and the patient and we agree with plan for discharge home.  She will call and follow-up with her neurology team and rest and stay hydrated.  Suspect some degree of dehydration led to this breakthrough seizure.  Given her well appearance and stability for 7 hours here in the emergency department, we feel she is now safe for discharge home and patient agrees.  Will discharge for outpatient follow-up with understanding return precautions     Final diagnoses:  Seizure (HCC)      Clinical Impression: 1. Seizure [facility])     Disposition: Discharge  Condition: Good  I have discussed the results, Dx and Tx plan with the pt(& family if present). He/she/they expressed understanding and agree(s) with the plan. Discharge instructions discussed at great length. Strict return precautions discussed and pt &/or family have verbalized understanding of the instructions. No further questions at time of discharge.    New Prescriptions   No medications on file    Follow Up: your PCP and neurology teams         John Vasconcelos,  Lonni PARAS, MD 09/28/24 2227  "

## 2024-09-28 NOTE — ED Triage Notes (Signed)
 Pt BIB from home c/o seizure of unknown length on the bed, witnessed by family. A/ox2 on scene, episode of incontinence and dizziness. Denies any pain. Hx of seizures.  140/68 94HR 100% RA 143 CBG

## 2024-10-05 ENCOUNTER — Ambulatory Visit: Admitting: Podiatry

## 2024-10-05 DIAGNOSIS — B351 Tinea unguium: Secondary | ICD-10-CM

## 2024-10-05 DIAGNOSIS — L600 Ingrowing nail: Secondary | ICD-10-CM

## 2024-10-05 NOTE — Progress Notes (Signed)
 Patient presents for evaluation and treatment of tenderness and some redness around nails feet.  Tenderness around toes with walking and wearing shoes.  Nails been thick.  Fifth nail ends are particularly digging in the distal ends of her toes.  She also complains of pain on the hallux nails where the nails are curved in.  Type 2 diabetes  Physical exam:  General appearance: Alert, pleasant, and in no acute distress.  Vascular: Pedal pulses: DP 1/4 B/L, PT 1/4 B/L.  Mild edema lower legs bilaterally.  Capillary refill time immediate bilaterally  Neurologic: Light touch intact bilaterally.  Achilles tendon reflex normal.  Vibratory sensation intact feet bilaterally  Dermatologic:  Nails thickened, disfigured, discolored 1-5 BL with subungual debris.  Redness and hypertrophic nail folds along nail folds bilaterally but no signs of drainage or infection.  Musculoskeletal:  Hammertoes 2 through 5 bilaterally.  Hallux valgus bilaterally normal muscle strength lower extremity bilaterally   Diagnosis: 1. Painful onychomycotic nails 1 through 5 bilaterally. 2. Pain toes 1 through 5 bilaterally. 3.  Ingrown nails bilaterally  Plan: -New patient office visit for evaluation and management level 3.  Modifier 25. - Discussed over the thickened dystrophic and ingrown nails continuing the hallux and fifth toes.  Best option would be to try to keep these trimmed down every 3 months.  If they do become more of a problem or start getting infected would need to consider matrixectomy's on the nails however would need to do ABI testing before proceeding with that  -Debrided onychomycotic nails 1 through 5 bilaterally.  Sharply debrided nails with nail clipper and reduced with a power bur.  Return 3 months [facility]

## 2025-01-02 ENCOUNTER — Ambulatory Visit: Admitting: Podiatry
